# Patient Record
Sex: Female | Born: 1950 | Race: Black or African American | Hispanic: No | Marital: Single | Smoking: Never smoker
Health system: Southern US, Community
[De-identification: ages and names within clinical notes are randomized; demographics above are authoritative.]

## PROBLEM LIST (undated history)

## (undated) DIAGNOSIS — A048 Other specified bacterial intestinal infections: Secondary | ICD-10-CM

## (undated) DIAGNOSIS — M549 Dorsalgia, unspecified: Secondary | ICD-10-CM

## (undated) DIAGNOSIS — Z860100 Personal history of colon polyps, unspecified: Secondary | ICD-10-CM

## (undated) DIAGNOSIS — R7301 Impaired fasting glucose: Secondary | ICD-10-CM

## (undated) DIAGNOSIS — M179 Osteoarthritis of knee, unspecified: Secondary | ICD-10-CM

## (undated) DIAGNOSIS — I10 Essential (primary) hypertension: Secondary | ICD-10-CM

## (undated) DIAGNOSIS — Z9289 Personal history of other medical treatment: Secondary | ICD-10-CM

## (undated) DIAGNOSIS — Z8669 Personal history of other diseases of the nervous system and sense organs: Secondary | ICD-10-CM

## (undated) DIAGNOSIS — G479 Sleep disorder, unspecified: Secondary | ICD-10-CM

## (undated) DIAGNOSIS — M1710 Unilateral primary osteoarthritis, unspecified knee: Secondary | ICD-10-CM

## (undated) DIAGNOSIS — K219 Gastro-esophageal reflux disease without esophagitis: Secondary | ICD-10-CM

## (undated) DIAGNOSIS — E785 Hyperlipidemia, unspecified: Secondary | ICD-10-CM

## (undated) DIAGNOSIS — F419 Anxiety disorder, unspecified: Secondary | ICD-10-CM

## (undated) DIAGNOSIS — D259 Leiomyoma of uterus, unspecified: Secondary | ICD-10-CM

## (undated) DIAGNOSIS — E039 Hypothyroidism, unspecified: Secondary | ICD-10-CM

## (undated) DIAGNOSIS — G8929 Other chronic pain: Secondary | ICD-10-CM

## (undated) HISTORY — DX: Unilateral primary osteoarthritis, unspecified knee: M17.10

## (undated) HISTORY — DX: Sleep disorder, unspecified: G47.9

## (undated) HISTORY — DX: Hyperlipidemia, unspecified: E78.5

## (undated) HISTORY — DX: Personal history of colon polyps, unspecified: Z86.0100

## (undated) HISTORY — DX: Impaired fasting glucose: R73.01

## (undated) HISTORY — DX: Leiomyoma of uterus, unspecified: D25.9

## (undated) HISTORY — DX: Personal history of colonic polyps: Z86.010

## (undated) HISTORY — PX: ESOPHAGOGASTRODUODENOSCOPY: SHX1529

## (undated) HISTORY — PX: TUBAL LIGATION: SHX77

## (undated) HISTORY — PX: COLONOSCOPY: SHX174

## (undated) HISTORY — DX: Anxiety disorder, unspecified: F41.9

## (undated) HISTORY — DX: Essential (primary) hypertension: I10

## (undated) HISTORY — PX: OTHER SURGICAL HISTORY: SHX169

## (undated) HISTORY — PX: BACK SURGERY: SHX140

## (undated) HISTORY — DX: Osteoarthritis of knee, unspecified: M17.9

## (undated) HISTORY — DX: Other specified bacterial intestinal infections: A04.8

---

## 1996-09-10 HISTORY — PX: CARPAL TUNNEL RELEASE: SHX101

## 1997-12-16 ENCOUNTER — Encounter: Admission: RE | Admit: 1997-12-16 | Discharge: 1997-12-16 | Payer: Self-pay | Admitting: Family Medicine

## 1997-12-22 ENCOUNTER — Encounter: Admission: RE | Admit: 1997-12-22 | Discharge: 1997-12-22 | Payer: Self-pay | Admitting: Family Medicine

## 1998-04-20 ENCOUNTER — Encounter: Admission: RE | Admit: 1998-04-20 | Discharge: 1998-04-20 | Payer: Self-pay | Admitting: Family Medicine

## 1998-05-11 ENCOUNTER — Encounter: Admission: RE | Admit: 1998-05-11 | Discharge: 1998-05-11 | Payer: Self-pay | Admitting: Family Medicine

## 1998-06-01 ENCOUNTER — Encounter: Admission: RE | Admit: 1998-06-01 | Discharge: 1998-06-01 | Payer: Self-pay | Admitting: Family Medicine

## 1998-08-02 ENCOUNTER — Encounter: Admission: RE | Admit: 1998-08-02 | Discharge: 1998-08-02 | Payer: Self-pay | Admitting: Family Medicine

## 1998-09-07 ENCOUNTER — Encounter: Admission: RE | Admit: 1998-09-07 | Discharge: 1998-09-07 | Payer: Self-pay | Admitting: Family Medicine

## 1998-10-19 ENCOUNTER — Encounter: Admission: RE | Admit: 1998-10-19 | Discharge: 1998-10-19 | Payer: Self-pay | Admitting: Family Medicine

## 1998-10-19 ENCOUNTER — Ambulatory Visit (HOSPITAL_COMMUNITY): Admission: RE | Admit: 1998-10-19 | Discharge: 1998-10-19 | Payer: Self-pay

## 1998-10-26 ENCOUNTER — Encounter: Admission: RE | Admit: 1998-10-26 | Discharge: 1998-10-26 | Payer: Self-pay | Admitting: Family Medicine

## 1998-11-02 ENCOUNTER — Encounter: Admission: RE | Admit: 1998-11-02 | Discharge: 1998-11-02 | Payer: Self-pay | Admitting: Family Medicine

## 1998-11-18 ENCOUNTER — Encounter: Admission: RE | Admit: 1998-11-18 | Discharge: 1998-11-18 | Payer: Self-pay | Admitting: Family Medicine

## 1998-11-23 ENCOUNTER — Encounter: Admission: RE | Admit: 1998-11-23 | Discharge: 1998-11-23 | Payer: Self-pay | Admitting: Family Medicine

## 1998-11-25 ENCOUNTER — Encounter: Admission: RE | Admit: 1998-11-25 | Discharge: 1998-11-25 | Payer: Self-pay | Admitting: Family Medicine

## 1998-12-02 ENCOUNTER — Encounter: Admission: RE | Admit: 1998-12-02 | Discharge: 1998-12-02 | Payer: Self-pay | Admitting: Family Medicine

## 1998-12-05 ENCOUNTER — Encounter: Admission: RE | Admit: 1998-12-05 | Discharge: 1998-12-05 | Payer: Self-pay | Admitting: Family Medicine

## 1998-12-06 ENCOUNTER — Encounter: Admission: RE | Admit: 1998-12-06 | Discharge: 1998-12-06 | Payer: Self-pay | Admitting: Family Medicine

## 1998-12-14 ENCOUNTER — Encounter: Admission: RE | Admit: 1998-12-14 | Discharge: 1998-12-14 | Payer: Self-pay | Admitting: Family Medicine

## 1998-12-23 ENCOUNTER — Encounter: Admission: RE | Admit: 1998-12-23 | Discharge: 1998-12-23 | Payer: Self-pay | Admitting: Sports Medicine

## 1998-12-28 ENCOUNTER — Encounter: Admission: RE | Admit: 1998-12-28 | Discharge: 1998-12-28 | Payer: Self-pay | Admitting: Family Medicine

## 1999-01-02 ENCOUNTER — Ambulatory Visit (HOSPITAL_BASED_OUTPATIENT_CLINIC_OR_DEPARTMENT_OTHER): Admission: RE | Admit: 1999-01-02 | Discharge: 1999-01-02 | Payer: Self-pay | Admitting: Orthopedic Surgery

## 1999-02-09 HISTORY — PX: KNEE ARTHROSCOPY: SHX127

## 1999-03-16 ENCOUNTER — Encounter: Admission: RE | Admit: 1999-03-16 | Discharge: 1999-03-16 | Payer: Self-pay | Admitting: Family Medicine

## 1999-04-06 ENCOUNTER — Encounter: Admission: RE | Admit: 1999-04-06 | Discharge: 1999-04-06 | Payer: Self-pay | Admitting: Family Medicine

## 1999-07-12 ENCOUNTER — Encounter: Admission: RE | Admit: 1999-07-12 | Discharge: 1999-07-12 | Payer: Self-pay | Admitting: Family Medicine

## 1999-12-14 ENCOUNTER — Encounter: Admission: RE | Admit: 1999-12-14 | Discharge: 1999-12-14 | Payer: Self-pay | Admitting: Family Medicine

## 1999-12-14 ENCOUNTER — Ambulatory Visit (HOSPITAL_COMMUNITY): Admission: RE | Admit: 1999-12-14 | Discharge: 1999-12-14 | Payer: Self-pay | Admitting: *Deleted

## 2000-04-30 ENCOUNTER — Encounter: Admission: RE | Admit: 2000-04-30 | Discharge: 2000-04-30 | Payer: Self-pay | Admitting: Family Medicine

## 2000-06-04 ENCOUNTER — Encounter: Admission: RE | Admit: 2000-06-04 | Discharge: 2000-06-04 | Payer: Self-pay | Admitting: Sports Medicine

## 2000-06-04 ENCOUNTER — Encounter: Admission: RE | Admit: 2000-06-04 | Discharge: 2000-06-04 | Payer: Self-pay | Admitting: Family Medicine

## 2000-06-20 ENCOUNTER — Encounter: Admission: RE | Admit: 2000-06-20 | Discharge: 2000-06-20 | Payer: Self-pay | Admitting: Family Medicine

## 2000-07-02 ENCOUNTER — Encounter: Admission: RE | Admit: 2000-07-02 | Discharge: 2000-07-02 | Payer: Self-pay | Admitting: Family Medicine

## 2000-07-11 ENCOUNTER — Encounter: Admission: RE | Admit: 2000-07-11 | Discharge: 2000-07-11 | Payer: Self-pay | Admitting: Family Medicine

## 2000-10-22 ENCOUNTER — Encounter: Admission: RE | Admit: 2000-10-22 | Discharge: 2000-10-22 | Payer: Self-pay | Admitting: Family Medicine

## 2000-12-05 ENCOUNTER — Encounter: Payer: Self-pay | Admitting: Gastroenterology

## 2000-12-05 ENCOUNTER — Encounter: Admission: RE | Admit: 2000-12-05 | Discharge: 2000-12-05 | Payer: Self-pay | Admitting: Gastroenterology

## 2001-01-03 ENCOUNTER — Ambulatory Visit (HOSPITAL_COMMUNITY): Admission: RE | Admit: 2001-01-03 | Discharge: 2001-01-03 | Payer: Self-pay

## 2001-01-03 ENCOUNTER — Encounter: Payer: Self-pay | Admitting: Gastroenterology

## 2001-01-14 ENCOUNTER — Encounter: Admission: RE | Admit: 2001-01-14 | Discharge: 2001-01-14 | Payer: Self-pay | Admitting: Family Medicine

## 2001-02-05 ENCOUNTER — Ambulatory Visit (HOSPITAL_COMMUNITY): Admission: RE | Admit: 2001-02-05 | Discharge: 2001-02-05 | Payer: Self-pay | Admitting: Gastroenterology

## 2001-02-05 ENCOUNTER — Encounter (INDEPENDENT_AMBULATORY_CARE_PROVIDER_SITE_OTHER): Payer: Self-pay | Admitting: *Deleted

## 2001-03-12 ENCOUNTER — Encounter: Admission: RE | Admit: 2001-03-12 | Discharge: 2001-03-12 | Payer: Self-pay | Admitting: Family Medicine

## 2001-04-04 ENCOUNTER — Encounter: Admission: RE | Admit: 2001-04-04 | Discharge: 2001-04-04 | Payer: Self-pay | Admitting: Family Medicine

## 2001-04-30 ENCOUNTER — Encounter: Admission: RE | Admit: 2001-04-30 | Discharge: 2001-04-30 | Payer: Self-pay | Admitting: Family Medicine

## 2001-04-30 ENCOUNTER — Ambulatory Visit (HOSPITAL_COMMUNITY): Admission: RE | Admit: 2001-04-30 | Discharge: 2001-04-30 | Payer: Self-pay

## 2001-05-01 ENCOUNTER — Encounter: Payer: Self-pay | Admitting: *Deleted

## 2001-05-01 ENCOUNTER — Encounter: Admission: RE | Admit: 2001-05-01 | Discharge: 2001-05-01 | Payer: Self-pay | Admitting: Family Medicine

## 2001-05-01 ENCOUNTER — Encounter: Admission: RE | Admit: 2001-05-01 | Discharge: 2001-05-01 | Payer: Self-pay | Admitting: *Deleted

## 2001-08-11 ENCOUNTER — Encounter: Admission: RE | Admit: 2001-08-11 | Discharge: 2001-08-11 | Payer: Self-pay | Admitting: Family Medicine

## 2001-09-10 HISTORY — PX: ENDOMETRIAL BIOPSY: SHX622

## 2001-09-17 ENCOUNTER — Encounter: Admission: RE | Admit: 2001-09-17 | Discharge: 2001-09-17 | Payer: Self-pay | Admitting: Family Medicine

## 2001-10-14 ENCOUNTER — Encounter: Admission: RE | Admit: 2001-10-14 | Discharge: 2001-10-14 | Payer: Self-pay | Admitting: Family Medicine

## 2002-01-05 ENCOUNTER — Ambulatory Visit (HOSPITAL_COMMUNITY): Admission: RE | Admit: 2002-01-05 | Discharge: 2002-01-05 | Payer: Self-pay | Admitting: Family Medicine

## 2002-01-20 ENCOUNTER — Encounter (INDEPENDENT_AMBULATORY_CARE_PROVIDER_SITE_OTHER): Payer: Self-pay | Admitting: *Deleted

## 2002-01-20 ENCOUNTER — Encounter: Admission: RE | Admit: 2002-01-20 | Discharge: 2002-01-20 | Payer: Self-pay | Admitting: Family Medicine

## 2003-01-08 ENCOUNTER — Encounter: Admission: RE | Admit: 2003-01-08 | Discharge: 2003-01-08 | Payer: Self-pay | Admitting: Family Medicine

## 2003-01-26 ENCOUNTER — Encounter: Admission: RE | Admit: 2003-01-26 | Discharge: 2003-01-26 | Payer: Self-pay | Admitting: Family Medicine

## 2003-01-26 ENCOUNTER — Ambulatory Visit (HOSPITAL_COMMUNITY): Admission: RE | Admit: 2003-01-26 | Discharge: 2003-01-26 | Payer: Self-pay | Admitting: Family Medicine

## 2003-01-29 ENCOUNTER — Encounter: Payer: Self-pay | Admitting: Sports Medicine

## 2003-01-29 ENCOUNTER — Encounter: Admission: RE | Admit: 2003-01-29 | Discharge: 2003-01-29 | Payer: Self-pay | Admitting: Sports Medicine

## 2003-07-07 ENCOUNTER — Encounter: Admission: RE | Admit: 2003-07-07 | Discharge: 2003-07-07 | Payer: Self-pay | Admitting: Family Medicine

## 2004-01-03 ENCOUNTER — Ambulatory Visit (HOSPITAL_COMMUNITY): Admission: RE | Admit: 2004-01-03 | Discharge: 2004-01-03 | Payer: Self-pay | Admitting: Gastroenterology

## 2004-01-03 ENCOUNTER — Encounter (INDEPENDENT_AMBULATORY_CARE_PROVIDER_SITE_OTHER): Payer: Self-pay | Admitting: *Deleted

## 2004-01-27 ENCOUNTER — Ambulatory Visit (HOSPITAL_COMMUNITY): Admission: RE | Admit: 2004-01-27 | Discharge: 2004-01-27 | Payer: Self-pay | Admitting: Sports Medicine

## 2004-01-27 ENCOUNTER — Encounter: Admission: RE | Admit: 2004-01-27 | Discharge: 2004-01-27 | Payer: Self-pay | Admitting: Sports Medicine

## 2004-02-14 ENCOUNTER — Encounter: Admission: RE | Admit: 2004-02-14 | Discharge: 2004-02-14 | Payer: Self-pay | Admitting: Family Medicine

## 2004-06-30 ENCOUNTER — Ambulatory Visit: Payer: Self-pay | Admitting: Sports Medicine

## 2004-07-10 ENCOUNTER — Ambulatory Visit: Payer: Self-pay | Admitting: Sports Medicine

## 2004-07-12 ENCOUNTER — Encounter: Admission: RE | Admit: 2004-07-12 | Discharge: 2004-07-12 | Payer: Self-pay | Admitting: Sports Medicine

## 2004-08-29 ENCOUNTER — Ambulatory Visit: Payer: Self-pay | Admitting: Family Medicine

## 2004-09-15 ENCOUNTER — Encounter (INDEPENDENT_AMBULATORY_CARE_PROVIDER_SITE_OTHER): Payer: Self-pay | Admitting: *Deleted

## 2004-09-15 LAB — CONVERTED CEMR LAB

## 2004-09-18 ENCOUNTER — Ambulatory Visit: Payer: Self-pay | Admitting: Family Medicine

## 2004-10-19 ENCOUNTER — Ambulatory Visit: Payer: Self-pay | Admitting: Family Medicine

## 2004-12-07 ENCOUNTER — Ambulatory Visit: Payer: Self-pay | Admitting: Internal Medicine

## 2005-01-05 ENCOUNTER — Ambulatory Visit: Payer: Self-pay | Admitting: Internal Medicine

## 2005-02-06 ENCOUNTER — Ambulatory Visit (HOSPITAL_COMMUNITY): Admission: RE | Admit: 2005-02-06 | Discharge: 2005-02-06 | Payer: Self-pay | Admitting: Internal Medicine

## 2005-03-28 ENCOUNTER — Ambulatory Visit: Payer: Self-pay | Admitting: Internal Medicine

## 2005-05-28 ENCOUNTER — Ambulatory Visit: Payer: Self-pay | Admitting: Internal Medicine

## 2005-07-30 ENCOUNTER — Ambulatory Visit: Payer: Self-pay | Admitting: Internal Medicine

## 2005-10-29 ENCOUNTER — Ambulatory Visit: Payer: Self-pay | Admitting: Internal Medicine

## 2005-11-29 ENCOUNTER — Encounter: Payer: Self-pay | Admitting: Internal Medicine

## 2006-02-25 ENCOUNTER — Ambulatory Visit (HOSPITAL_COMMUNITY): Admission: RE | Admit: 2006-02-25 | Discharge: 2006-02-25 | Payer: Self-pay | Admitting: Internal Medicine

## 2006-02-28 ENCOUNTER — Ambulatory Visit: Payer: Self-pay | Admitting: Internal Medicine

## 2006-05-03 ENCOUNTER — Emergency Department (HOSPITAL_COMMUNITY): Admission: EM | Admit: 2006-05-03 | Discharge: 2006-05-03 | Payer: Self-pay | Admitting: Emergency Medicine

## 2006-05-13 ENCOUNTER — Emergency Department (HOSPITAL_COMMUNITY): Admission: EM | Admit: 2006-05-13 | Discharge: 2006-05-13 | Payer: Self-pay | Admitting: Family Medicine

## 2006-06-10 LAB — CONVERTED CEMR LAB: Pap Smear: NORMAL

## 2006-06-24 ENCOUNTER — Ambulatory Visit: Payer: Self-pay | Admitting: Internal Medicine

## 2006-06-24 LAB — CONVERTED CEMR LAB
AST: 22 units/L (ref 0–37)
Albumin: 3.6 g/dL (ref 3.5–5.2)
Alkaline Phosphatase: 126 units/L — ABNORMAL HIGH (ref 39–117)
BUN: 23 mg/dL (ref 6–23)
Calcium: 9.6 mg/dL (ref 8.4–10.5)
Cholesterol: 186 mg/dL (ref 0–200)
Creatinine, Ser: 1.2 mg/dL (ref 0.4–1.2)
Eosinophil percent: 3.5 % (ref 0.0–5.0)
HCT: 36.4 % (ref 36.0–46.0)
Hemoglobin: 12 g/dL (ref 12.0–15.0)
Hgb A1c MFr Bld: 5.7 % (ref 4.6–6.0)
Neutrophils Relative %: 55.5 % (ref 43.0–77.0)
Potassium: 4.4 meq/L (ref 3.5–5.1)
RDW: 13.4 % (ref 11.5–14.6)
Total Bilirubin: 0.5 mg/dL (ref 0.3–1.2)
Total Protein: 6.9 g/dL (ref 6.0–8.3)
Triglyceride fasting, serum: 70 mg/dL (ref 0–149)

## 2006-07-01 ENCOUNTER — Encounter: Payer: Self-pay | Admitting: Internal Medicine

## 2006-07-01 ENCOUNTER — Ambulatory Visit: Payer: Self-pay | Admitting: Internal Medicine

## 2006-07-01 ENCOUNTER — Encounter: Admission: RE | Admit: 2006-07-01 | Discharge: 2006-07-01 | Payer: Self-pay | Admitting: Internal Medicine

## 2006-07-01 ENCOUNTER — Other Ambulatory Visit: Admission: RE | Admit: 2006-07-01 | Discharge: 2006-07-01 | Payer: Self-pay | Admitting: Internal Medicine

## 2006-07-21 ENCOUNTER — Encounter: Admission: RE | Admit: 2006-07-21 | Discharge: 2006-07-21 | Payer: Self-pay | Admitting: Orthopedic Surgery

## 2006-07-26 ENCOUNTER — Ambulatory Visit: Payer: Self-pay | Admitting: Internal Medicine

## 2006-09-23 ENCOUNTER — Ambulatory Visit: Payer: Self-pay | Admitting: Internal Medicine

## 2006-09-23 LAB — CONVERTED CEMR LAB
ALT: 22 units/L (ref 0–40)
Bilirubin, Direct: 0.1 mg/dL (ref 0.0–0.3)
CO2: 28 meq/L (ref 19–32)
Chol/HDL Ratio, serum: 2
Cholesterol: 121 mg/dL (ref 0–200)
Glomerular Filtration Rate, Af Am: 43 mL/min/{1.73_m2}
Glucose, Bld: 126 mg/dL — ABNORMAL HIGH (ref 70–99)
HDL: 61.6 mg/dL (ref >39.0)
Potassium: 3.8 meq/L (ref 3.5–5.1)
Total Protein: 6.9 g/dL (ref 6.0–8.3)
Triglyceride fasting, serum: 56 mg/dL (ref 0–149)

## 2006-10-07 ENCOUNTER — Ambulatory Visit: Payer: Self-pay | Admitting: Internal Medicine

## 2006-10-11 ENCOUNTER — Encounter: Admission: RE | Admit: 2006-10-11 | Discharge: 2007-01-09 | Payer: Self-pay | Admitting: Internal Medicine

## 2006-10-25 ENCOUNTER — Encounter: Payer: Self-pay | Admitting: Internal Medicine

## 2006-11-02 ENCOUNTER — Ambulatory Visit (HOSPITAL_COMMUNITY): Admission: RE | Admit: 2006-11-02 | Discharge: 2006-11-02 | Payer: Self-pay | Admitting: Neurology

## 2006-11-08 ENCOUNTER — Encounter (INDEPENDENT_AMBULATORY_CARE_PROVIDER_SITE_OTHER): Payer: Self-pay | Admitting: *Deleted

## 2006-12-09 ENCOUNTER — Ambulatory Visit: Payer: Self-pay | Admitting: Internal Medicine

## 2006-12-09 LAB — CONVERTED CEMR LAB
Alkaline Phosphatase: 133 units/L — ABNORMAL HIGH (ref 39–117)
BUN: 22 mg/dL (ref 6–23)
CO2: 31 meq/L (ref 19–32)
GFR calc Af Amer: 60 mL/min
Potassium: 4.3 meq/L (ref 3.5–5.1)
Total Protein: 6.5 g/dL (ref 6.0–8.3)

## 2007-02-12 ENCOUNTER — Other Ambulatory Visit (HOSPITAL_COMMUNITY): Admission: RE | Admit: 2007-02-12 | Discharge: 2007-05-13 | Payer: Self-pay | Admitting: Psychiatry

## 2007-02-12 ENCOUNTER — Ambulatory Visit: Payer: Self-pay | Admitting: Psychiatry

## 2007-04-17 ENCOUNTER — Ambulatory Visit (HOSPITAL_COMMUNITY): Admission: RE | Admit: 2007-04-17 | Discharge: 2007-04-17 | Payer: Self-pay | Admitting: Internal Medicine

## 2007-05-13 DIAGNOSIS — Z860100 Personal history of colon polyps, unspecified: Secondary | ICD-10-CM | POA: Insufficient documentation

## 2007-05-13 DIAGNOSIS — F329 Major depressive disorder, single episode, unspecified: Secondary | ICD-10-CM

## 2007-05-13 DIAGNOSIS — I10 Essential (primary) hypertension: Secondary | ICD-10-CM | POA: Insufficient documentation

## 2007-05-13 DIAGNOSIS — R7309 Other abnormal glucose: Secondary | ICD-10-CM | POA: Insufficient documentation

## 2007-05-13 DIAGNOSIS — E785 Hyperlipidemia, unspecified: Secondary | ICD-10-CM | POA: Insufficient documentation

## 2007-08-18 ENCOUNTER — Other Ambulatory Visit (HOSPITAL_COMMUNITY): Admission: RE | Admit: 2007-08-18 | Discharge: 2007-09-25 | Payer: Self-pay | Admitting: Psychiatry

## 2007-08-19 ENCOUNTER — Ambulatory Visit: Payer: Self-pay | Admitting: Psychiatry

## 2007-08-26 ENCOUNTER — Telehealth: Payer: Self-pay | Admitting: Internal Medicine

## 2007-10-01 ENCOUNTER — Ambulatory Visit: Payer: Self-pay | Admitting: Internal Medicine

## 2007-10-01 LAB — CONVERTED CEMR LAB
Bilirubin Urine: NEGATIVE
Glucose, Urine, Semiquant: NEGATIVE
Protein, U semiquant: NEGATIVE
Specific Gravity, Urine: 1.025
Urobilinogen, UA: 0.2
pH: 7

## 2007-10-05 LAB — CONVERTED CEMR LAB
Albumin: 3.6 g/dL (ref 3.5–5.2)
Alkaline Phosphatase: 163 units/L — ABNORMAL HIGH (ref 39–117)
BUN: 18 mg/dL (ref 6–23)
Basophils Absolute: 0 10*3/uL (ref 0.0–0.1)
Cholesterol: 158 mg/dL (ref 0–200)
GFR calc Af Amer: 50 mL/min
HDL: 64 mg/dL (ref >39.0)
Hemoglobin: 12.4 g/dL (ref 12.0–15.0)
Lymphocytes Relative: 21.8 % (ref 12.0–46.0)
MCHC: 34.2 g/dL (ref 30.0–36.0)
MCV: 93.7 fL (ref 78.0–100.0)
Monocytes Absolute: 0.4 10*3/uL (ref 0.2–0.7)
Monocytes Relative: 5.8 % (ref 3.0–11.0)
Neutro Abs: 4.8 10*3/uL (ref 1.4–7.7)
Neutrophils Relative %: 65.3 % (ref 43.0–77.0)
Potassium: 4.3 meq/L (ref 3.5–5.1)
Sodium: 141 meq/L (ref 135–145)
TSH: 3.15 microintl units/mL (ref 0.35–5.50)
Total Protein: 6.8 g/dL (ref 6.0–8.3)

## 2007-10-07 ENCOUNTER — Encounter: Payer: Self-pay | Admitting: Internal Medicine

## 2007-10-07 ENCOUNTER — Ambulatory Visit: Payer: Self-pay | Admitting: Internal Medicine

## 2007-10-07 ENCOUNTER — Other Ambulatory Visit: Admission: RE | Admit: 2007-10-07 | Discharge: 2007-10-07 | Payer: Self-pay | Admitting: Internal Medicine

## 2007-10-07 DIAGNOSIS — K219 Gastro-esophageal reflux disease without esophagitis: Secondary | ICD-10-CM

## 2007-10-07 DIAGNOSIS — R748 Abnormal levels of other serum enzymes: Secondary | ICD-10-CM | POA: Insufficient documentation

## 2007-12-03 ENCOUNTER — Ambulatory Visit: Payer: Self-pay | Admitting: Internal Medicine

## 2007-12-07 LAB — CONVERTED CEMR LAB
CO2: 30 meq/L (ref 19–32)
Calcium: 9.3 mg/dL (ref 8.4–10.5)
Chloride: 104 meq/L (ref 96–112)
Creatinine, Ser: 1.1 mg/dL (ref 0.4–1.2)
Creatinine,U: 145.8 mg/dL
GFR calc non Af Amer: 55 mL/min
Microalb, Ur: 0.2 mg/dL (ref 0.0–1.9)
Sodium: 141 meq/L (ref 135–145)

## 2007-12-09 ENCOUNTER — Ambulatory Visit: Payer: Self-pay | Admitting: Internal Medicine

## 2007-12-23 ENCOUNTER — Telehealth: Payer: Self-pay | Admitting: Internal Medicine

## 2007-12-31 ENCOUNTER — Telehealth: Payer: Self-pay | Admitting: Internal Medicine

## 2008-01-09 ENCOUNTER — Encounter: Payer: Self-pay | Admitting: Internal Medicine

## 2008-01-09 ENCOUNTER — Emergency Department (HOSPITAL_COMMUNITY): Admission: EM | Admit: 2008-01-09 | Discharge: 2008-01-09 | Payer: Self-pay | Admitting: Emergency Medicine

## 2008-01-13 ENCOUNTER — Ambulatory Visit: Payer: Self-pay | Admitting: Internal Medicine

## 2008-01-16 ENCOUNTER — Telehealth: Payer: Self-pay | Admitting: Internal Medicine

## 2008-01-19 LAB — CONVERTED CEMR LAB
CO2: 29 meq/L (ref 19–32)
Calcium: 9.7 mg/dL (ref 8.4–10.5)
Creatinine, Ser: 1.1 mg/dL (ref 0.4–1.2)
GFR calc Af Amer: 66 mL/min
Glucose, Bld: 84 mg/dL (ref 70–99)
Sodium: 142 meq/L (ref 135–145)

## 2008-02-09 ENCOUNTER — Ambulatory Visit: Payer: Self-pay | Admitting: Internal Medicine

## 2008-02-16 LAB — CONVERTED CEMR LAB
BUN: 19 mg/dL (ref 6–23)
Calcium: 9.6 mg/dL (ref 8.4–10.5)
Creatinine, Ser: 1.3 mg/dL — ABNORMAL HIGH (ref 0.4–1.2)
GFR calc Af Amer: 54 mL/min
GFR calc non Af Amer: 45 mL/min
Glucose, Bld: 99 mg/dL (ref 70–99)
Potassium: 3.3 meq/L — ABNORMAL LOW (ref 3.5–5.1)

## 2008-03-15 ENCOUNTER — Telehealth: Payer: Self-pay | Admitting: *Deleted

## 2008-03-16 ENCOUNTER — Telehealth: Payer: Self-pay | Admitting: Internal Medicine

## 2008-03-24 ENCOUNTER — Telehealth: Payer: Self-pay | Admitting: Internal Medicine

## 2008-04-05 ENCOUNTER — Ambulatory Visit: Payer: Self-pay | Admitting: Internal Medicine

## 2008-04-06 LAB — CONVERTED CEMR LAB
ALT: 21 units/L (ref 0–35)
Alkaline Phosphatase: 112 units/L (ref 39–117)
Basophils Absolute: 0 10*3/uL (ref 0.0–0.1)
Bilirubin, Direct: 0.1 mg/dL (ref 0.0–0.3)
CO2: 29 meq/L (ref 19–32)
Calcium: 9.6 mg/dL (ref 8.4–10.5)
Glucose, Bld: 108 mg/dL — ABNORMAL HIGH (ref 70–99)
Lymphocytes Relative: 29 % (ref 12.0–46.0)
MCHC: 34.7 g/dL (ref 30.0–36.0)
Monocytes Relative: 9.5 % (ref 3.0–12.0)
Neutro Abs: 2.8 10*3/uL (ref 1.4–7.7)
Neutrophils Relative %: 58.6 % (ref 43.0–77.0)
Platelets: 279 10*3/uL (ref 150–400)
Potassium: 4.7 meq/L (ref 3.5–5.1)
RDW: 13.1 % (ref 11.5–14.6)
Sodium: 141 meq/L (ref 135–145)
Total Bilirubin: 0.5 mg/dL (ref 0.3–1.2)
Total Protein: 6.8 g/dL (ref 6.0–8.3)

## 2008-05-06 ENCOUNTER — Telehealth: Payer: Self-pay | Admitting: Internal Medicine

## 2008-05-19 ENCOUNTER — Telehealth: Payer: Self-pay | Admitting: *Deleted

## 2008-05-21 ENCOUNTER — Ambulatory Visit: Payer: Self-pay | Admitting: Internal Medicine

## 2008-05-21 DIAGNOSIS — R413 Other amnesia: Secondary | ICD-10-CM | POA: Insufficient documentation

## 2008-06-18 ENCOUNTER — Telehealth: Payer: Self-pay | Admitting: *Deleted

## 2008-06-23 ENCOUNTER — Ambulatory Visit: Payer: Self-pay | Admitting: Internal Medicine

## 2008-06-25 ENCOUNTER — Encounter: Admission: RE | Admit: 2008-06-25 | Discharge: 2008-06-25 | Payer: Self-pay | Admitting: Neurology

## 2008-08-03 ENCOUNTER — Telehealth: Payer: Self-pay | Admitting: Internal Medicine

## 2008-08-06 ENCOUNTER — Telehealth: Payer: Self-pay | Admitting: Internal Medicine

## 2008-08-09 ENCOUNTER — Encounter: Admission: RE | Admit: 2008-08-09 | Discharge: 2008-08-09 | Payer: Self-pay | Admitting: Neurology

## 2008-08-09 ENCOUNTER — Ambulatory Visit: Payer: Self-pay | Admitting: Psychology

## 2008-09-07 ENCOUNTER — Ambulatory Visit: Payer: Self-pay | Admitting: Internal Medicine

## 2008-09-07 DIAGNOSIS — M1990 Unspecified osteoarthritis, unspecified site: Secondary | ICD-10-CM | POA: Insufficient documentation

## 2008-09-13 LAB — CONVERTED CEMR LAB
BUN: 11 mg/dL (ref 6–23)
CO2: 29 meq/L (ref 19–32)
Calcium: 9.6 mg/dL (ref 8.4–10.5)
Glucose, Bld: 92 mg/dL (ref 70–99)
Potassium: 3.9 meq/L (ref 3.5–5.1)
Sodium: 141 meq/L (ref 135–145)

## 2008-11-10 ENCOUNTER — Telehealth: Payer: Self-pay | Admitting: *Deleted

## 2008-11-12 ENCOUNTER — Telehealth: Payer: Self-pay | Admitting: *Deleted

## 2008-12-16 ENCOUNTER — Telehealth: Payer: Self-pay | Admitting: *Deleted

## 2008-12-30 ENCOUNTER — Telehealth: Payer: Self-pay | Admitting: *Deleted

## 2008-12-31 ENCOUNTER — Encounter: Payer: Self-pay | Admitting: *Deleted

## 2009-01-14 ENCOUNTER — Encounter: Payer: Self-pay | Admitting: *Deleted

## 2009-01-14 ENCOUNTER — Telehealth: Payer: Self-pay | Admitting: *Deleted

## 2009-01-18 ENCOUNTER — Telehealth: Payer: Self-pay | Admitting: *Deleted

## 2009-04-01 ENCOUNTER — Telehealth: Payer: Self-pay | Admitting: *Deleted

## 2009-04-22 ENCOUNTER — Telehealth: Payer: Self-pay | Admitting: *Deleted

## 2009-05-27 ENCOUNTER — Ambulatory Visit: Payer: Self-pay | Admitting: Internal Medicine

## 2009-05-31 LAB — CONVERTED CEMR LAB
AST: 23 units/L (ref 0–37)
Albumin: 3.8 g/dL (ref 3.5–5.2)
Alkaline Phosphatase: 154 units/L — ABNORMAL HIGH (ref 39–117)
BUN: 15 mg/dL (ref 6–23)
Basophils Relative: 0.6 % (ref 0.0–3.0)
CO2: 29 meq/L (ref 19–32)
Cholesterol: 160 mg/dL (ref 0–200)
Eosinophils Relative: 5.9 % — ABNORMAL HIGH (ref 0.0–5.0)
Glucose, Bld: 100 mg/dL — ABNORMAL HIGH (ref 70–99)
HCT: 35.3 % — ABNORMAL LOW (ref 36.0–46.0)
Hemoglobin: 11.7 g/dL — ABNORMAL LOW (ref 12.0–15.0)
Hgb A1c MFr Bld: 5.7 % (ref 4.6–6.5)
Lymphs Abs: 1.5 10*3/uL (ref 0.7–4.0)
MCV: 97.5 fL (ref 78.0–100.0)
Monocytes Absolute: 0.4 10*3/uL (ref 0.1–1.0)
Neutro Abs: 2.1 10*3/uL (ref 1.4–7.7)
Potassium: 4.5 meq/L (ref 3.5–5.1)
RBC: 3.62 M/uL — ABNORMAL LOW (ref 3.87–5.11)
Sodium: 142 meq/L (ref 135–145)
Total Protein: 6.9 g/dL (ref 6.0–8.3)
WBC: 4.2 10*3/uL — ABNORMAL LOW (ref 4.5–10.5)

## 2009-07-11 ENCOUNTER — Telehealth: Payer: Self-pay | Admitting: *Deleted

## 2009-07-19 ENCOUNTER — Ambulatory Visit: Payer: Self-pay | Admitting: Internal Medicine

## 2009-07-19 LAB — CONVERTED CEMR LAB
ALT: 19 units/L (ref 0–35)
AST: 18 units/L (ref 0–37)
Albumin: 3.8 g/dL (ref 3.5–5.2)
Alkaline Phosphatase: 121 units/L — ABNORMAL HIGH (ref 39–117)
Basophils Absolute: 0 10*3/uL (ref 0.0–0.1)
HCT: 35 % — ABNORMAL LOW (ref 36.0–46.0)
Iron: 57 ug/dL (ref 42–145)
Lymphs Abs: 1.3 10*3/uL (ref 0.7–4.0)
Monocytes Absolute: 0.4 10*3/uL (ref 0.1–1.0)
Monocytes Relative: 8.9 % (ref 3.0–12.0)
Platelets: 226 10*3/uL (ref 150.0–400.0)
RDW: 12.9 % (ref 11.5–14.6)
Vitamin B-12: 414 pg/mL (ref 211–911)

## 2009-07-21 DIAGNOSIS — E559 Vitamin D deficiency, unspecified: Secondary | ICD-10-CM | POA: Insufficient documentation

## 2009-09-07 ENCOUNTER — Telehealth: Payer: Self-pay | Admitting: *Deleted

## 2009-09-10 HISTORY — PX: KNEE SURGERY: SHX244

## 2009-09-10 HISTORY — PX: TOTAL KNEE ARTHROPLASTY: SHX125

## 2009-10-04 ENCOUNTER — Ambulatory Visit: Payer: Self-pay | Admitting: Internal Medicine

## 2009-10-04 LAB — CONVERTED CEMR LAB
AST: 23 units/L (ref 0–37)
Alkaline Phosphatase: 110 units/L (ref 39–117)
BUN: 18 mg/dL (ref 6–23)
Basophils Absolute: 0 10*3/uL (ref 0.0–0.1)
Bilirubin Urine: NEGATIVE
Calcium: 9.2 mg/dL (ref 8.4–10.5)
Cholesterol: 126 mg/dL (ref 0–200)
Eosinophils Absolute: 0.2 10*3/uL (ref 0.0–0.7)
GFR calc non Af Amer: 59.22 mL/min (ref >60)
Glucose, Bld: 101 mg/dL — ABNORMAL HIGH (ref 70–99)
HDL: 78.3 mg/dL (ref >39.00)
Ketones, urine, test strip: NEGATIVE
Lymphocytes Relative: 40.2 % (ref 12.0–46.0)
Lymphs Abs: 1.4 10*3/uL (ref 0.7–4.0)
Monocytes Relative: 11.3 % (ref 3.0–12.0)
Platelets: 227 10*3/uL (ref 150.0–400.0)
Protein, U semiquant: NEGATIVE
RDW: 13.4 % (ref 11.5–14.6)
TSH: 3.56 microintl units/mL (ref 0.35–5.50)
Total Bilirubin: 0.7 mg/dL (ref 0.3–1.2)
Triglycerides: 34 mg/dL (ref 0.0–149.0)
Urobilinogen, UA: 0.2
VLDL: 6.8 mg/dL (ref 0.0–40.0)
Vit D, 25-Hydroxy: 31 ng/mL (ref 30–89)

## 2009-10-11 ENCOUNTER — Ambulatory Visit: Payer: Self-pay | Admitting: Internal Medicine

## 2009-10-11 ENCOUNTER — Other Ambulatory Visit: Admission: RE | Admit: 2009-10-11 | Discharge: 2009-10-11 | Payer: Self-pay | Admitting: Internal Medicine

## 2009-11-07 ENCOUNTER — Ambulatory Visit (HOSPITAL_COMMUNITY): Admission: RE | Admit: 2009-11-07 | Discharge: 2009-11-07 | Payer: Self-pay | Admitting: Internal Medicine

## 2009-11-21 ENCOUNTER — Inpatient Hospital Stay (HOSPITAL_COMMUNITY): Admission: RE | Admit: 2009-11-21 | Discharge: 2009-11-30 | Payer: Self-pay | Admitting: Psychiatry

## 2009-11-21 ENCOUNTER — Emergency Department (HOSPITAL_COMMUNITY): Admission: EM | Admit: 2009-11-21 | Discharge: 2009-11-21 | Payer: Self-pay | Admitting: Emergency Medicine

## 2009-11-21 ENCOUNTER — Ambulatory Visit: Payer: Self-pay | Admitting: Psychiatry

## 2009-12-26 ENCOUNTER — Telehealth: Payer: Self-pay | Admitting: *Deleted

## 2009-12-27 ENCOUNTER — Telehealth: Payer: Self-pay | Admitting: *Deleted

## 2010-01-03 ENCOUNTER — Encounter: Payer: Self-pay | Admitting: Internal Medicine

## 2010-01-03 ENCOUNTER — Telehealth: Payer: Self-pay | Admitting: Internal Medicine

## 2010-01-09 ENCOUNTER — Ambulatory Visit: Payer: Self-pay | Admitting: Internal Medicine

## 2010-01-09 ENCOUNTER — Telehealth: Payer: Self-pay | Admitting: *Deleted

## 2010-01-11 ENCOUNTER — Encounter (INDEPENDENT_AMBULATORY_CARE_PROVIDER_SITE_OTHER): Payer: Self-pay | Admitting: *Deleted

## 2010-01-11 ENCOUNTER — Telehealth: Payer: Self-pay | Admitting: *Deleted

## 2010-01-16 ENCOUNTER — Encounter: Payer: Self-pay | Admitting: Internal Medicine

## 2010-01-20 ENCOUNTER — Inpatient Hospital Stay (HOSPITAL_COMMUNITY): Admission: RE | Admit: 2010-01-20 | Discharge: 2010-01-24 | Payer: Self-pay | Admitting: Orthopedic Surgery

## 2010-01-20 ENCOUNTER — Encounter: Payer: Self-pay | Admitting: Internal Medicine

## 2010-02-03 ENCOUNTER — Encounter (INDEPENDENT_AMBULATORY_CARE_PROVIDER_SITE_OTHER): Payer: Self-pay | Admitting: *Deleted

## 2010-02-07 ENCOUNTER — Telehealth: Payer: Self-pay | Admitting: Internal Medicine

## 2010-02-08 ENCOUNTER — Encounter: Payer: Self-pay | Admitting: Internal Medicine

## 2010-02-09 ENCOUNTER — Encounter (INDEPENDENT_AMBULATORY_CARE_PROVIDER_SITE_OTHER): Payer: Self-pay | Admitting: *Deleted

## 2010-02-17 ENCOUNTER — Ambulatory Visit: Payer: Self-pay | Admitting: Internal Medicine

## 2010-02-21 ENCOUNTER — Encounter: Payer: Self-pay | Admitting: Internal Medicine

## 2010-03-01 ENCOUNTER — Telehealth: Payer: Self-pay | Admitting: Internal Medicine

## 2010-03-03 ENCOUNTER — Telehealth: Payer: Self-pay | Admitting: Internal Medicine

## 2010-03-15 ENCOUNTER — Ambulatory Visit: Payer: Self-pay | Admitting: Internal Medicine

## 2010-03-17 ENCOUNTER — Telehealth: Payer: Self-pay | Admitting: Internal Medicine

## 2010-03-17 ENCOUNTER — Encounter: Payer: Self-pay | Admitting: Internal Medicine

## 2010-03-20 ENCOUNTER — Ambulatory Visit: Payer: Self-pay | Admitting: Internal Medicine

## 2010-03-20 DIAGNOSIS — Z96659 Presence of unspecified artificial knee joint: Secondary | ICD-10-CM

## 2010-03-22 ENCOUNTER — Telehealth: Payer: Self-pay | Admitting: *Deleted

## 2010-04-10 ENCOUNTER — Ambulatory Visit: Payer: Self-pay | Admitting: Internal Medicine

## 2010-04-10 DIAGNOSIS — R1319 Other dysphagia: Secondary | ICD-10-CM | POA: Insufficient documentation

## 2010-04-11 LAB — CONVERTED CEMR LAB
BUN: 18 mg/dL (ref 6–23)
Basophils Absolute: 0 10*3/uL (ref 0.0–0.1)
Basophils Relative: 0.3 % (ref 0.0–3.0)
CO2: 30 meq/L (ref 19–32)
Calcium: 9.4 mg/dL (ref 8.4–10.5)
Chloride: 106 meq/L (ref 96–112)
Creatinine, Ser: 1.5 mg/dL — ABNORMAL HIGH (ref 0.4–1.2)
Eosinophils Relative: 2.3 % (ref 0.0–5.0)
Glucose, Bld: 114 mg/dL — ABNORMAL HIGH (ref 70–99)
Lymphocytes Relative: 30.3 % (ref 12.0–46.0)
Lymphs Abs: 1.6 10*3/uL (ref 0.7–4.0)
Monocytes Relative: 12.5 % — ABNORMAL HIGH (ref 3.0–12.0)
Neutro Abs: 2.9 10*3/uL (ref 1.4–7.7)
Transferrin: 176.3 mg/dL — ABNORMAL LOW (ref 212.0–360.0)

## 2010-04-13 ENCOUNTER — Encounter: Payer: Self-pay | Admitting: Internal Medicine

## 2010-04-13 ENCOUNTER — Ambulatory Visit: Payer: Self-pay | Admitting: Internal Medicine

## 2010-04-13 DIAGNOSIS — K5730 Diverticulosis of large intestine without perforation or abscess without bleeding: Secondary | ICD-10-CM | POA: Insufficient documentation

## 2010-04-25 ENCOUNTER — Ambulatory Visit: Payer: Self-pay | Admitting: Internal Medicine

## 2010-05-23 ENCOUNTER — Telehealth: Payer: Self-pay | Admitting: *Deleted

## 2010-06-12 ENCOUNTER — Ambulatory Visit: Payer: Self-pay | Admitting: Internal Medicine

## 2010-06-12 LAB — CONVERTED CEMR LAB
Basophils Absolute: 0 10*3/uL (ref 0.0–0.1)
Chloride: 105 meq/L (ref 96–112)
Eosinophils Relative: 1.3 % (ref 0.0–5.0)
GFR calc non Af Amer: 47.12 mL/min (ref >60)
Glucose, Bld: 106 mg/dL — ABNORMAL HIGH (ref 70–99)
HCT: 34.9 % — ABNORMAL LOW (ref 36.0–46.0)
Hemoglobin: 11.7 g/dL — ABNORMAL LOW (ref 12.0–15.0)
Lymphs Abs: 1.5 10*3/uL (ref 0.7–4.0)
MCV: 97.2 fL (ref 78.0–100.0)
Monocytes Absolute: 0.4 10*3/uL (ref 0.1–1.0)
Monocytes Relative: 8.5 % (ref 3.0–12.0)
Neutro Abs: 2.8 10*3/uL (ref 1.4–7.7)
Potassium: 3.8 meq/L (ref 3.5–5.1)
RDW: 13.3 % (ref 11.5–14.6)
Sodium: 140 meq/L (ref 135–145)

## 2010-06-15 ENCOUNTER — Encounter: Payer: Self-pay | Admitting: Internal Medicine

## 2010-06-19 ENCOUNTER — Ambulatory Visit: Payer: Self-pay | Admitting: Internal Medicine

## 2010-06-23 ENCOUNTER — Inpatient Hospital Stay (HOSPITAL_COMMUNITY): Admission: RE | Admit: 2010-06-23 | Discharge: 2010-06-27 | Payer: Self-pay | Admitting: Orthopedic Surgery

## 2010-06-27 ENCOUNTER — Encounter: Payer: Self-pay | Admitting: Internal Medicine

## 2010-07-19 ENCOUNTER — Ambulatory Visit: Payer: Self-pay | Admitting: Internal Medicine

## 2010-07-19 DIAGNOSIS — D649 Anemia, unspecified: Secondary | ICD-10-CM | POA: Insufficient documentation

## 2010-07-31 ENCOUNTER — Encounter: Payer: Self-pay | Admitting: Internal Medicine

## 2010-08-22 ENCOUNTER — Ambulatory Visit: Payer: Self-pay | Admitting: Internal Medicine

## 2010-08-22 DIAGNOSIS — E669 Obesity, unspecified: Secondary | ICD-10-CM

## 2010-08-29 ENCOUNTER — Emergency Department (HOSPITAL_COMMUNITY)
Admission: EM | Admit: 2010-08-29 | Discharge: 2010-08-29 | Payer: Self-pay | Source: Home / Self Care | Admitting: Emergency Medicine

## 2010-09-07 ENCOUNTER — Ambulatory Visit
Admission: RE | Admit: 2010-09-07 | Discharge: 2010-09-07 | Payer: Self-pay | Source: Home / Self Care | Attending: Internal Medicine | Admitting: Internal Medicine

## 2010-09-07 ENCOUNTER — Telehealth: Payer: Self-pay | Admitting: Internal Medicine

## 2010-09-07 DIAGNOSIS — S298XXA Other specified injuries of thorax, initial encounter: Secondary | ICD-10-CM | POA: Insufficient documentation

## 2010-09-26 ENCOUNTER — Encounter: Payer: Self-pay | Admitting: Internal Medicine

## 2010-10-01 ENCOUNTER — Encounter: Payer: Self-pay | Admitting: Internal Medicine

## 2010-10-02 ENCOUNTER — Encounter: Payer: Self-pay | Admitting: Internal Medicine

## 2010-10-10 NOTE — Consult Note (Signed)
Summary: [facility]  [facility]   Imported By: Maryln Gottron 01/25/2010 12:36:07  _____________________________________________________________________  External Attachment:    Type:   Image     Comment:   External Document

## 2010-10-10 NOTE — Progress Notes (Signed)
Summary: surgical clearence  Phone Note Call from Patient Call back at Home Phone [phone]   Caller: Patient Summary of Call: Pt saw Dr. Althea Charon today and they are going to do surgery Left knee replacement. Pt is unsure of the date. There office is going to fax over info to [place] for surgerical clearence. Pt had a cpx with ekg on 10/11/2009 Initial call taken by: Romualdo Bolk, CMA (AAMA),  January 03, 2010 1:52 PM

## 2010-10-10 NOTE — Letter (Signed)
Summary: EGD Instructions  [facility]  [address] [place], [place] [address]   Phone: [phone]  Fax: [phone]       [name]    [birth year]    MRN: [id number]       Procedure Day /Date:04-25-10     Arrival Time: 12:30 PM      Procedure Time: 1:30 PM     Location of Procedure:                    X     [facility] (4th Floor)  PREPARATION FOR ENDOSCOPY   On 04-25-10 THE DAY OF THE PROCEDURE:  1.   No solid foods, milk or milk products are allowed after midnight the night before your procedure.  2.   Do not drink anything colored red or purple.  Avoid juices with pulp.  No orange juice.  3.  You may drink clear liquids until 11:30 AM  which is 2 hours before your procedure.                                                                                                CLEAR LIQUIDS INCLUDE: Water Jello Ice Popsicles Tea (sugar ok, no milk/cream) Powdered fruit flavored drinks Coffee (sugar ok, no milk/cream) Gatorade Juice: apple, white grape, white cranberry  Lemonade Clear bullion, consomm, broth Carbonated beverages (any kind) Strained chicken noodle soup Hard Candy   MEDICATION INSTRUCTIONS  Unless otherwise instructed, you should take regular prescription medications with a small sip of water as early as possible the morning of your procedure.        OTHER INSTRUCTIONS  You will need a responsible adult at least 60 years of age to accompany you and drive you home.   This person must remain in the waiting room during your procedure.  Wear loose fitting clothing that is easily removed.  Leave jewelry and other valuables at home.  However, you may wish to bring a book to read or an iPod/MP3 player to listen to music as you wait for your procedure to start.  Remove all body piercing jewelry and leave at home.  Total time from sign-in until discharge is approximately 2-3 hours.  You should go home directly after your  procedure and rest.  You can resume normal activities the day after your procedure.  The day of your procedure you should not:   Drive   Make legal decisions   Operate machinery   Drink alcohol   Return to work  You will receive specific instructions about eating, activities and medications before you leave.    The above instructions have been reviewed and explained to me by   _______________________    I fully understand and can verbalize these instructions _____________________________ Date _________

## 2010-10-10 NOTE — Letter (Signed)
Summary: Previsit letter  [facility]  [address] [place], [place] [address]   Phone: [phone]  Fax: [phone]       01/11/2010 MRN: [id number]  [facility] [address] [place], [place]  [address]  Dear [name],  Welcome to the Gastroenterology Division at [facility].    You are scheduled to see a nurse for your pre-procedure visit on 02-10-10 at 1PM on the 3rd floor at [facility], [address]  We ask that you try to arrive at our office 15 minutes prior to your appointment time to allow for check-in.  Your nurse visit will consist of discussing your medical and surgical history, your immediate family medical history, and your medications.    Please bring a complete list of all your medications or, if you prefer, bring the medication bottles and we will list them.  We will need to be aware of both prescribed and over the counter drugs.  We will need to know exact dosage information as well.  If you are on blood thinners (Coumadin, Plavix, Aggrenox, Ticlid, etc.) please call our office today/prior to your appointment, as we need to consult with your physician about holding your medication.   Please be prepared to read and sign documents such as consent forms, a financial agreement, and acknowledgement forms.  If necessary, and with your consent, a friend or relative is welcome to sit-in on the nurse visit with you.  Please bring your insurance card so that we may make a copy of it.  If your insurance requires a referral to see a specialist, please bring your referral form from your primary care physician.  No co-pay is required for this nurse visit.     If you cannot keep your appointment, please call [phone] to cancel or reschedule prior to your appointment date.  This allows [place] the opportunity to schedule an appointment for another patient in need of care.    Thank you for choosing [facility] for your medical  needs.  We appreciate the opportunity to care for you.  Please visit [place] at our website  to learn more about our practice.                     Sincerely.                                                                                                                   The Gastroenterology Division

## 2010-10-10 NOTE — Progress Notes (Signed)
Summary: bp advise  Phone Note From Other Clinic Call back at [phone]   Caller: [facility] Summary of Call: Folowing the patient's bp. This morning---bp is 145/95, pulse oz is 96% and pulse is 80. 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 Even with her pain meds, she still having a lot of pain, which causes her bp issue. Please advise.  Initial call taken by: Warnell Forester,  March 03, 2010 10:34 AM  Follow-up for Phone Call        Left message for Almyra Free to call back. I have also called Dr. Luiz Blare office and spoke with Angie and they will try to send her something stronger for pain. But if it gets too bad then she needs to come in. They have a Sat Clinic that she can go to. Follow-up by: Romualdo Bolk, CMA (AAMA),  March 03, 2010 11:07 AM     Appended Document: bp advise Almyra Free called back saying that pt is having pain in both knees and pain is on a scale of 6. Pt is not having any cardiac symptoms. Almyra Free has also reviewed with pt the signs and symptoms of a stroke and when to call 911. Pt was also given a list of info on this as well by [facility]. Almyra Free is aware that Angie from Dr. Luiz Blare office would be contacting the pt about increasing her pain meds.

## 2010-10-10 NOTE — Progress Notes (Signed)
Summary: refills  Phone Note From Pharmacy   [facility] [address] [phone]* Reason for Call: Needs renewal Details for Reason: micardis, iburofen,tramadol, nexium and crestor Initial call taken by: Romualdo Bolk, CMA (AAMA),  Jan 09, 2010 1:37 PM  Follow-up for Phone Call        please do this for 6 months worth. Follow-up by: Madelin Headings MD,  Jan 09, 2010 3:44 PM  Additional Follow-up for Phone Call Additional follow up Details #1::        rx sent  Additional Follow-up by: Romualdo Bolk, CMA Duncan Dull),  Jan 09, 2010 4:39 PM    Prescriptions: TRAMADOL HCL 50 MG  TABS (TRAMADOL HCL) 1 by mouth qid x per day as needed  forpain  #60 Each x 5   Entered by:   Romualdo Bolk, CMA (AAMA)   Authorized by:   Madelin Headings MD   Signed by:   Romualdo Bolk, CMA (AAMA) on 01/09/2010   Method used:   [facility] to        [facility] [address] [phone]* (retail)       [address]       [place], [place]  [address]       Ph: [phone] or [phone]       Fax: [phone]   RxID:   [id number] IBUPROFEN 800 MG  TABS (IBUPROFEN) 1 by mouth two times a day  #30 x 5   Entered by:   Romualdo Bolk, CMA (AAMA)   Authorized by:   Madelin Headings MD   Signed by:   Romualdo Bolk, CMA (AAMA) on 01/09/2010   Method used:   [facility]ronically to        [facility] [address] [phone]* (retail)       [address]       [place], [place]  [address]       Ph: [phone] or [phone]       Fax: [phone]   RxID:   [id number] MICARDIS HCT 80-25 MG  TABS (TELMISARTAN-HCTZ) 1 by mouth once daily  #30 x 5   Entered by:   Romualdo Bolk, CMA (AAMA)   Authorized by:   Madelin Headings MD   Signed by:   Romualdo Bolk, CMA (AAMA) on 01/09/2010   Method used:   Electronically to        [facility] [address] [phone]* (retail)       [address]       [place], [place]  [address]       Ph: [phone] or [phone]       Fax: [phone]   RxID:   [id number] NEXIUM 40 MG CPDR (ESOMEPRAZOLE MAGNESIUM) Take 1 capsule by mouth once a day  #30 x 5   Entered by:   Romualdo Bolk, CMA (AAMA)   Authorized by:   Madelin Headings MD   Signed by:   Romualdo Bolk, CMA (AAMA) on 01/09/2010   Method used:   Electronically to        [facility] [address] [phone]* (retail)       [address]       [place], [place]  [address]  Ph: [phone] or [phone]       Fax: [phone]   RxID:   [id number] CRESTOR 10 MG TABS (ROSUVASTATIN CALCIUM) 1 tablet every night  #30 x 5   Entered by:   Romualdo Bolk, CMA (AAMA)   Authorized by:   Madelin Headings MD   Signed by:   Romualdo Bolk, CMA (AAMA) on 01/09/2010   Method used:   Electronically to        [facility] [address] [phone]* (retail)       [address]       [place], [place]  [address]       Ph: [phone] or [phone]       Fax: [phone]   RxID:   [id number]

## 2010-10-10 NOTE — Letter (Signed)
Summary: [facility]  [facility]   Imported By: Maryln Gottron 06/27/2010 12:43:25  _____________________________________________________________________  External Attachment:    Type:   Image     Comment:   External Document

## 2010-10-10 NOTE — Progress Notes (Signed)
Summary: refill   Phone Note From Pharmacy   Caller: [facility] Reason for Call: Needs renewal Details for Reason: Klor Con Initial call taken by: Romualdo Bolk, CMA Duncan Dull),  March 22, 2010 4:31 PM  Follow-up for Phone Call        Rx sent to pharmacy Follow-up by: Romualdo Bolk, CMA Duncan Dull),  March 22, 2010 4:32 PM    Prescriptions: KLOR-CON M20 20 MEQ  TBCR (POTASSIUM CHLORIDE CRYS CR) 2 by mouth once daily  #60 Each x 1   Entered by:   Romualdo Bolk, CMA (AAMA)   Authorized by:   Madelin Headings MD   Signed by:   Romualdo Bolk, CMA (AAMA) on 03/22/2010   Method used:   Electronically to        [facility]  [phone]* (retail)       [address]       [place], [place]  [address]       Ph: [phone] or [phone]       Fax: [phone]   RxID:   [id number]

## 2010-10-10 NOTE — Progress Notes (Signed)
Summary: LM for pt to call back  ---- Converted from flag ---- ---- 01/09/2010 9:13 AM, Madelin Headings MD wrote: forgot to tell her that we will  need to get her to [facility] referral for  anemia and colonscopy   now that she has medicare and insurance  ( Can be done after her surgery ).  Please let her know this. ------------------------------  LMTOCB Romualdo Bolk, CMA (AAMA)  Jan 11, 2010 11:33 AM  Pt aware and order sent to [facility]. Romualdo Bolk, CMA (AAMA)  Jan 11, 2010 11:41 AM

## 2010-10-10 NOTE — Progress Notes (Signed)
Summary: refill  Phone Note Call from Patient Call back at [facility] Phone [phone]   Caller: Patient Summary of Call: refill on vit d 50,000 Initial call taken by: Romualdo Bolk, CMA Duncan Dull),  May 23, 2010 3:52 PM  Follow-up for Phone Call        Pt states that she has been taking this weekly. Rx sent to pharmacy. Pt to have  BMP   and  Hg HCt done on 10/3. Would you like her to have a Vit d level done as well? Last Vit d level was checked 10/04/09   Follow-up by: Romualdo Bolk, CMA Duncan Dull),  May 23, 2010 3:57 PM  Additional Follow-up for Phone Call Additional follow up Details #1::        Per Dr. Fabian Sharp- Okay to do Vit d level. Order put in IDX Additional Follow-up by: Romualdo Bolk, CMA Duncan Dull),  May 23, 2010 5:16 PM    Prescriptions: VITAMIN D (ERGOCALCIFEROL) 50000 UNIT CAPS (ERGOCALCIFEROL) 1 by mouth weekly  #12 x 0   Entered by:   Romualdo Bolk, CMA (AAMA)   Authorized by:   Madelin Headings MD   Signed by:   Romualdo Bolk, CMA (AAMA) on 05/23/2010   Method used:   Electronically to        [facility] [address] [phone]* (retail)       [address]       [place], [place]  [address]       Ph: [phone] or [phone]       Fax: [phone]   RxID:   [id number]

## 2010-10-10 NOTE — Progress Notes (Signed)
Summary: colon  Phone Note Call from Patient Call back at Home Phone [phone]   Caller: vm Call For: shannon Summary of Call: Still in rehab center.  Need to cancel appt nurse colon Fri.  Name & number so I can reschedule.   Initial call taken by: Rudy Jew, RN,  Feb 07, 2010 3:06 PM  Follow-up for Phone Call        Left detailed message for pt with name and number of [facility] and reminded pt of the no-show policy. Follow-up by: Corky Mull,  Feb 07, 2010 4:46 PM

## 2010-10-10 NOTE — Miscellaneous (Signed)
Summary: [facility] Previsit/prep  Clinical Lists Changes  Medications: Added new medication of DULCOLAX 5 MG  TBEC (BISACODYL) Day before procedure take 2 at 3pm and 2 at 8pm. - Signed Added new medication of METOCLOPRAMIDE HCL 10 MG  TABS (METOCLOPRAMIDE HCL) As per prep instructions. - Signed Added new medication of MIRALAX   POWD (POLYETHYLENE GLYCOL 3350) As per prep  instructions. - Signed Rx of DULCOLAX 5 MG  TBEC (BISACODYL) Day before procedure take 2 at 3pm and 2 at 8pm.;  #4 x 0;  Signed;  Entered by: Wyona Almas RN;  Authorized by: Hart Carwin MD;  Method used: Electronically to [facility] [address] [phone]*, [address], [place], [place], [place]  [address], Ph: [phone] or [phone], Fax: [phone] Rx of METOCLOPRAMIDE HCL 10 MG  TABS (METOCLOPRAMIDE HCL) As per prep instructions.;  #2 x 0;  Signed;  Entered by: Wyona Almas RN;  Authorized by: Hart Carwin MD;  Method used: Electronically to [facility]  [facility] [address] [phone]*, [address], [place], [place], [place]  [address], Ph: [phone] or [phone], Fax: [phone] Rx of MIRALAX   POWD (POLYETHYLENE GLYCOL 3350) As per prep  instructions.;  #255gm x 0;  Signed;  Entered by: Wyona Almas RN;  Authorized by: Hart Carwin MD;  Method used: Electronically to [facility]  [facility] [address] [phone]*, [address] [place], [place], [place]  [address], Ph: [phone] or [phone], Fax: [phone] Observations: Added new observation of ALLERGY REV: Done (02/17/2010 13:11)    Prescriptions: MIRALAX   POWD (POLYETHYLENE GLYCOL 3350) As per prep  instructions.  #255gm x 0   Entered by:   Wyona Almas RN   Authorized by:   Hart Carwin MD   Signed by:   Wyona Almas RN on 02/17/2010   Method used:   Electronically to        [facility]  [facility] [address] [phone]* (retail)       [address]       [place], [place]  [address]       Ph: [phone] or [phone]       Fax:  [phone]   RxID:   [id number] METOCLOPRAMIDE HCL 10 MG  TABS (METOCLOPRAMIDE HCL) As per prep instructions.  #2 x 0   Entered by:   Wyona Almas RN   Authorized by:   Hart Carwin MD   Signed by:   Wyona Almas RN on 02/17/2010   Method used:   Electronically to        [facility]  [facility] [address] [phone]* (retail)       [address]       [place], [place]  [address]       Ph: [phone] or [phone]       Fax: [phone]   RxID:   [id number] DULCOLAX 5 MG  TBEC (BISACODYL) Day before procedure take 2 at 3pm and 2 at 8pm.  #4 x 0   Entered by:   Wyona Almas RN   Authorized by:   Hart Carwin MD   Signed by:   Wyona Almas RN on 02/17/2010   Method used:   Electronically to        [facility]  [facility] [address] [phone]* (retail)       [address]       [facility],  [place]  [address]       Ph: [phone] or [phone]       Fax: [phone]   RxID:   [id number]

## 2010-10-10 NOTE — Letter (Signed)
Summary: [facility]  [facility]   Imported By: Maryln Gottron 01/26/2010 10:45:05  _____________________________________________________________________  External Attachment:    Type:   Image     Comment:   External Document

## 2010-10-10 NOTE — Assessment & Plan Note (Signed)
Summary: follow up/ssc   Vital Signs:  Patient profile:   60 year old female Menstrual status:  postmenopausal Weight:      248 pounds Pulse rate:   100 / minute BP sitting:   120 / 80  (right arm) Cuff size:   large  Vitals Entered By: Romualdo Bolk, CMA (AAMA) (June 19, 2010 1:36 PM) CC: Follow-up visit on labs, Hypertension Management   History of Present Illness: [name]  comes in for follow up of  ht renal funcxtion and anemia.   To have a right knee replacement  per   Dr Luiz Blare  on October 14th.  Since last visit has been doing ok and no bleeding   increasing gi distress. had endoscopy and no ulcers or strictures.      BP doing ok and no  se of meds   Hypertension History:      She denies headache, chest pain, palpitations, dyspnea with exertion, orthopnea, PND, peripheral edema, visual symptoms, neurologic problems, syncope, and side effects from treatment.  She notes no problems with any antihypertensive medication side effects.        Positive major cardiovascular risk factors include female age 9 years old or older, hyperlipidemia, and hypertension.  Negative major cardiovascular risk factors include non-tobacco-user status.        Further assessment for target organ damage reveals no history of ASHD.     Preventive Screening-Counseling & Management  Alcohol-Tobacco     Alcohol drinks/day: 0     Smoking Status: never  Caffeine-Diet-Exercise     Caffeine use/day: 2     Does Patient Exercise: no  Current Medications (verified): 1)  Crestor 10 Mg Tabs (Rosuvastatin Calcium) .[name].. 1 Tablet Every Night 2)  Lamotrigine 200 Mg  Tabs (Lamotrigine) .[name].. 1 By Mouth Once Daily 3)  Nexium 40 Mg Cpdr (Esomeprazole Magnesium) .... Take 1 Capsule By Mouth Two Times A Day 4)  Seroquel 100 Mg Tabs (Quetiapine Fumarate) .[name].. 1 1/2 Tabs Once Daily 5)  Budeprion Xl 150 Mg Xr24h-Tab (Bupropion Hcl) .[name].. 1 By Mouth Once Daily 6)  Micardis Hct 80-25 Mg  Tabs  (Telmisartan-Hctz) .[name].. 1 By Mouth Once Daily 7)  Ibuprofen 800 Mg  Tabs (Ibuprofen) .[name].. 1 By Mouth Two Times A Day 8)  Tramadol Hcl 50 Mg  Tabs (Tramadol Hcl) .[name].. 1 By Mouth Qid X Per Day As Needed  Forpain 9)  Klor-Con M20 20 Meq  Tbcr (Potassium Chloride Crys Cr) .... 2 By Mouth Once Daily 10)  Geodon 20 Mg Caps (Ziprasidone Hcl) .[name].. 1 By Mouth Qam and 2 Hs 11)  Lexapro 10 Mg Tabs (Escitalopram Oxalate) .[name].. 1 By Mouth At Bedtime 12)  Vitamin D (Ergocalciferol) 50000 Unit Caps (Ergocalciferol) .[name].. 1 By Mouth Weekly 13)  Ferrous Sulfate 325 (65 Fe) Mg  Tabs (Ferrous Sulfate) 14)  Colace 100 Mg Caps (Docusate Sodium) 15)  Nexium 40 Mg Cpdr (Esomeprazole Magnesium) .... Take 1 Tab 30 Min Prior To Breakfast 16)  Nexium 40 Mg  Cpdr (Esomeprazole Magnesium) .[name].. 1 Capsule Each Day 30 Minutes Before Meal  Allergies (verified): 1)  Aspirin (Aspirin)  Past History:  Past medical, surgical, family and social histories (including risk factors) reviewed, and no changes noted (except as noted below).  Past Medical History: Reviewed history from 04/13/2010 and no changes required. Bil knee DJD, Obsessive d/o pf biting finger/toe nails, tx for h.pylori  4/05 Depression, sleep Hyperlipidemia Hypertension hyperglycemia, fasting  Colonic polyps, hx of,  04/05,  also  July 2011/ADENOMATOUS  diverticulosis G5P3  DJD back knees minimal spondyloesthesis L4-5    Consults Dr. Allyne Gee- Psych Dr. Michael Boston Someone for her memory that Dr. Sandria Manly sent her to.  Past Surgical History: Reviewed history from 04/13/2010 and no changes required. Bil carpal tunnel release 1998  bil knee arthroscopy 6/00  endometrial bx 1/03 - benign proliferative - 09/17/2001 Tubal ligation 1980`s   [place] - fibroids sl. Larger - 01/09/2003 G5P3  Knee surgery  left  2011 tkr  Past History:  Care Management: Psychologist: [facility]: Merlene Morse  Family History: Reviewed history from 04/13/2010 and  no changes required. father-unknown past medical history, mom deceased lung CA,HTN, sisters:heart murmur, one with colon cancer  HT  Aunt alzheiners.   ? no parkinsons   Family History of Colon Cancer: Sister Family History of Diabetes: Great Grandmother  Social History: Reviewed history from 04/13/2010 and no changes required. 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  helping out  Patient has never smoked. -2 cups daily Alcohol Use - no Daily Caffeine Use Illicit Drug Use - no Patient does not get regular exercise.   Review of Systems  The patient denies anorexia, fever, weight loss, weight gain, vision loss, dyspnea on exertion, prolonged cough, hemoptysis, melena, hematochezia, muscle weakness, abnormal bleeding, enlarged lymph nodes, and angioedema.    Physical Exam  General:  Well-developed,well-nourished,in no acute distress; alert,appropriate and cooperative throughout examination Head:  normocephalic and atraumatic.   Neck:  No deformities, masses, or tenderness noted. Lungs:  Normal respiratory effort, chest expands symmetrically. Lungs are clear to auscultation, no crackles or wheezes. Heart:  Normal rate and regular rhythm. S1 and S2 normal without gallop, murmur, click, rub or other extra sounds. Pulses:  pulses intact without delay   Neurologic:  stiff antalgic gait    no  acute changes   rednesses  Skin:  turgor normal, color normal, no ecchymoses, and no petechiae.   Cervical Nodes:  No lymphadenopathy noted Psych:  Oriented X3, normally interactive, good eye contact, not anxious appearing, and not depressed appearing.     Impression & Recommendations:  Problem # 1:  OSTEOARTHRITIS (ICD-715.9) pre surgery    medical problems are stable for surgery The following medications were removed from the  medication list:    Percocet 5-325 Mg Tabs (Oxycodone-acetaminophen) Her updated medication list for this problem includes:    Ibuprofen 800 Mg Tabs (Ibuprofen) .[name]... 1 by mouth two times a day    Tramadol Hcl 50 Mg Tabs (Tramadol hcl) .[name]... 1 by mouth qid x per day as needed  forpain  Problem # 2:  ANEMIA, MILD (ICD-285.9)  Her updated medication list for this problem includes:    Ferrous Sulfate 325 (65 Fe) Mg Tabs (Ferrous sulfate)  Hgb: 11.7 (06/12/2010)   Hct: 34.9 (06/12/2010)   Platelets: 276.0 (06/12/2010) RBC: 3.59 (06/12/2010)   RDW: 13.3 (06/12/2010)   WBC: 4.8 (06/12/2010) MCV: 97.2 (06/12/2010)   MCHC: 33.5 (06/12/2010) Ferritin: 86.7 (04/10/2010) Iron: 64 (04/10/2010)   % Sat: 25.9 (04/10/2010) B12: 414 (07/19/2009)   TSH: 3.56 (10/04/2009)  Problem # 3:  HYPERTENSION (ICD-401.9)  Her updated medication list for this problem includes:    Micardis Hct 80-25 Mg Tabs (Telmisartan-hctz) .[name]... 1 by mouth once daily  BP today: 120/80 Prior BP: 130/80 (04/13/2010)  10 Yr Risk  Heart Disease: 4 % Prior 10 Yr Risk Heart Disease: 6 % (04/10/2010)  Labs Reviewed: K+: 3.8 (06/12/2010) Creat: : 1.5 (06/12/2010)   Chol: 126 (10/04/2009)   HDL: 78.30 (10/04/2009)   LDL: 41 (10/04/2009)   TG: 34.0 (10/04/2009)  Problem # 4:  RENAL INSUFFICIENCY  CR 1.5 (ICD-588.9) no change   Problem # 5:  VITAMIN D DEFICIENCY (ICD-268.9) Assessment: Improved  Complete Medication List: 1)  Crestor 10 Mg Tabs (Rosuvastatin calcium) .[name].. 1 tablet every night 2)  Lamotrigine 200 Mg Tabs (Lamotrigine) .[name].. 1 by mouth once daily 3)  Nexium 40 Mg Cpdr (Esomeprazole magnesium) .... Take 1 capsule by mouth two times a day 4)  Seroquel 100 Mg Tabs (Quetiapine fumarate) .[name].. 1 1/2 tabs once daily 5)  Budeprion Xl 150 Mg Xr24h-tab (Bupropion hcl) .[name].. 1 by mouth once daily 6)  Micardis Hct 80-25 Mg Tabs (Telmisartan-hctz) .[name].. 1 by mouth once daily 7)  Ibuprofen 800 Mg Tabs (Ibuprofen) .[name].. 1 by mouth  two times a day 8)  Tramadol Hcl 50 Mg Tabs (Tramadol hcl) .[name].. 1 by mouth qid x per day as needed  forpain 9)  Klor-con M20 20 Meq Tbcr (Potassium chloride crys cr) .... 2 by mouth once daily 10)  Geodon 20 Mg Caps (Ziprasidone hcl) .[name].. 1 by mouth qam and 2 hs 11)  Lexapro 10 Mg Tabs (Escitalopram oxalate) .[name].. 1 by mouth at bedtime 12)  Vitamin D (ergocalciferol) 50000 Unit Caps (Ergocalciferol) .[name].. 1 by mouth weekly 13)  Ferrous Sulfate 325 (65 Fe) Mg Tabs (Ferrous sulfate) 14)  Colace 100 Mg Caps (Docusate sodium) 15)  Nexium 40 Mg Cpdr (Esomeprazole magnesium) .... Take 1 tab 30 min prior to breakfast 16)  Nexium 40 Mg Cpdr (Esomeprazole magnesium) .[name].. 1 capsule each day 30 minutes before meal  Other Orders: Flu Vaccine 52yrs + MEDICARE PATIENTS (E4540) Administration Flu vaccine - MCR (J8119)  Hypertension Assessment/Plan:      The patient's hypertensive risk group is category B: At least one risk factor (excluding diabetes) with no target organ damage.  Her calculated 10 year risk of coronary heart disease is 4 %.  Today's blood pressure is 120/80.  Her blood pressure goal is < 140/90.  Patient Instructions: 1)  Please schedule a follow-up appointment in 4 -6 months or as needed  2)  Will send copy of labs to DR.   Graves  Flu Vaccine Consent Questions     Do you have a history of severe allergic reactions to this vaccine? no    Any prior history of allergic reactions to egg and/or gelatin? no    Do you have a sensitivity to the preservative Thimersol? no    Do you have a past history of Guillan-Barre Syndrome? no    Do you currently have an acute febrile illness? no    Have you ever had a severe reaction to latex? no    Vaccine information given and explained to patient? yes    Are you currently pregnant? no    Lot Number:[id number]   Exp Date:03/10/2011   Site Given  Left Deltoid IMflu1 Romualdo Bolk, CMA (AAMA)  June 19, 2010 1:52 PM

## 2010-10-10 NOTE — Assessment & Plan Note (Signed)
Summary: CPX//PAP//LH   Vital Signs:  Patient profile:   60 year old female Menstrual status:  postmenopausal Height:      65.5 inches Weight:      260 pounds Pulse rate:   66 / minute BP sitting:   120 / 90  (right arm) Cuff size:   large  Vitals Entered By: Romualdo Bolk, CMA (AAMA) (October 11, 2009 10:01 AM) CC: CPX with pap   History of Present Illness: [name]  comes in  for preventive visit  . has multiple medical problems  on medications  fairly stable.   Psych Depression    : seeing   [facility] Doc  .recent increase in the abilify.   Ht :   ok  LIPIds: No se of  meds.   GERD Nexium  Pain   med  2 x per day.     Knees bother her .   pain taking tramadol two times a day still hurts.     Preventive Care Screening  Prior Values:    Pap Smear:  Normal (06/10/2006)    Mammogram:  Normal Bilateral (12/10/2006)    Hemoccult:  Done. (09/15/2004)    Last Tetanus Booster:  Done. (06/14/2003)   Preventive Screening-Counseling & Management  Alcohol-Tobacco     Alcohol drinks/day: 0     Smoking Status: never  Caffeine-Diet-Exercise     Caffeine use/day: 2     Does Patient Exercise: no  Hep-HIV-STD-Contraception     Dental Visit-last 6 months no     Sun Exposure-Excessive: no  Safety-Violence-Falls     Seat Belt Use: yes     Smoke Detectors: yes  Comments: financial no insurance   EKG  Procedure date:  10/11/2009  Findings:      Normal sinus rhythm with rate of:    Current Medications (verified): 1)  Crestor 10 Mg Tabs (Rosuvastatin Calcium) .[name].. 1 Tablet Every Night 2)  Lamotrigine 200 Mg  Tabs (Lamotrigine) .[name].. 1 By Mouth Once Daily Dr. Nolen Mu 3)  Nexium 40 Mg Cpdr (Esomeprazole Magnesium) .... Take 1 Capsule By Mouth Once A Day 4)  Seroquel 200 Mg Tabs (Quetiapine Fumarate) .[name].. 1 Qam and 2 Qhs 5)  Budeprion Sr 150 Mg  Tb12 (Bupropion Hcl) .... 2  By Mouth Once Daily Dr. Nolen Mu 6)  Micardis Hct 80-25 Mg  Tabs  (Telmisartan-Hctz) .[name].. 1 By Mouth Once Daily 7)  Ibuprofen 800 Mg  Tabs (Ibuprofen) .[name].. 1 By Mouth Two Times A Day 8)  Tramadol Hcl 50 Mg  Tabs (Tramadol Hcl) .[name].. 1 By Mouth Qid X Per Day As Needed  Forpain 9)  Klor-Con M20 20 Meq  Tbcr (Potassium Chloride Crys Cr) .... 2 By Mouth Once Daily 10)  Abilify 20 Mg Tabs (Aripiprazole) .[name].. 1 By Mouth Once Daily 11)  Lexapro 10 Mg Tabs (Escitalopram Oxalate) .[name].. 1 By Mouth At Bedtime 12)  Vitamin D (Ergocalciferol) 50000 Unit Caps (Ergocalciferol) .[name].. 1 By Mouth Weekly  Allergies (verified): 1)  Aspirin (Aspirin)  Past History:  Past medical, surgical, family and social histories (including risk factors) reviewed, and no changes noted (except as noted below).  Past Medical History: Bil knee DJD, Obsessive d/o pf biting finger/toe nails, tx for h.pylori  4/05 Depression, sleep Hyperlipidemia Hypertension hyperglycemia, fasting  Colonic polyps, hx of,  04/05 G5P3  DJD back knees minimal spondyloesthesis L4-5     Consults Dr. Allyne Gee- Psych Dr. Michael Boston Someone for her memory that Dr. Sandria Manly sent  her to.  Past Surgical History: Reviewed history from 05/21/2008 and no changes required. Bil carpal tunnel release 1998 -, bil knee arthroscopy 6/00 -, endometrial bx 1/03 - benign proliferative - 09/17/2001, Tubal ligation 1980`s -, [place] - fibroids sl. Larger - 01/09/2003  G5P3   Family History: Reviewed history from 09/07/2008 and no changes required. father-unknown past medical history, mom deceased lung CA, DM2, HTN, sisters:heart murmur, one with colon cancer  HT   COLON cancer  Aunt alzheiners.   ? no parkinsons    Social History: Reviewed history from 05/27/2009 and no changes required. 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  The patient denies anorexia, fever, weight loss, vision loss, decreased hearing, hoarseness, chest pain, syncope, dyspnea on exertion, peripheral edema, prolonged cough, abdominal pain, melena, hematochezia, severe indigestion/heartburn, hematuria, muscle weakness, suspicious skin lesions, difficulty walking, unusual weight change, abnormal bleeding, enlarged lymph nodes, angioedema, and breast masses.         Nexium helps  no gerd symptom  Physical Exam General Appearance: well developed, well nourished, no acute distress Eyes: conjunctiva and lids normal, PERRLA, EOMI, WNL Ears, Nose, Mouth, Throat: TM clear, nares clear, oral exam WNL Neck: supple, no lymphadenopathy, no thyromegaly, no JVD Respiratory: clear to auscultation and percussion, respiratory effort normal Cardiovascular: regular rate and rhythm, S1-S2, no murmur, rub or gallop, no bruits, peripheral pulses normal and symmetric, no cyanosis, clubbing, edema or varicosities Chest: no scars, masses, tenderness; no asymmetry, skin changes, nipple discharge   Gastrointestinal: soft, non-tender; no hepatosplenomegaly, masses; active bowel sounds all quadrants, guaiac negative stool; no masses, tenderness, hemorrhoids  Genitourinary: no vaginal discharge, lesions; no masses or tenderness  PAP done  Lymphatic: no cervical, axillary or inguinal adenopathy Musculoskeletal: gait normal, muscle tone and strength WNL, no joint swelling, effusions, discoloration, crepitus  Skin: clear, good turgor, color WNL, no rashes, lesions, or ulcerations some callous on foot  no ulcers  toe nails thickened  Neurologic: normal mental status, normal reflexes, normal strength, sensation, and motion Psychiatric: alert; oriented to person, place and time Other Exam:  EKG NSR   labs reviewed     Impression & Recommendations:  Problem # 1:   PREVENTIVE HEALTH CARE (ICD-V70.0)  Discussed nutrition,exercise,diet,healthy weight, vitamin D and calcium.     needs mammo gram and colonscopy  but  No insuance until April when gets is eligible or MEdicare.  PAP done today   Orders: EKG w/ Interpretation (93000)  Problem # 2:  ANEMIA, MILD (ICD-285.9) low iron saturation noted last year.   no bleeding  but need colon etc eventually.      Problem # 3:  HYPERTENSION (ICD-401.9)  Her updated medication list for this problem includes:    Micardis Hct 80-25 Mg Tabs (Telmisartan-hctz) .[name]... 1 by mouth once daily cr stable    follow  BP today: 120/90 Prior BP: 120/90 (05/27/2009)  Prior 10 Yr Risk Heart Disease: 6 % (05/27/2009)  Labs Reviewed: K+: 4.3 (10/04/2009) Creat: : 1.2 (10/04/2009)   Chol: 126 (10/04/2009)   HDL: 78.30 (10/04/2009)   LDL: 41 (10/04/2009)   TG: 34.0 (10/04/2009)  Orders: EKG w/ Interpretation (93000)  Problem # 4:  ROUTINE GYNECOLOGICAL EXAMINATION (ICD-V72.31) pap done  Problem # 5:  HYPERLIPIDEMIA (ICD-272.4) Assessment: Unchanged continue Her updated medication list for this problem includes:    Crestor 10 Mg Tabs (Rosuvastatin calcium) .[name]... 1 tablet every night  Labs Reviewed: SGOT: 23 (10/04/2009)   SGPT: 20 (10/04/2009)  Prior 10 Yr Risk Heart Disease: 6 % (05/27/2009)   HDL:78.30 (10/04/2009), 83.90 (05/27/2009)  LDL:41 (10/04/2009), 70 (05/27/2009)  Chol:126 (10/04/2009), 160 (05/27/2009)  Trig:34.0 (10/04/2009), 30.0 (05/27/2009)  Problem # 6:  DEGENERATIVE JOINT DISEASE (ICD-715.90) problematic    obesity contributing Her updated medication list for this problem includes:    Ibuprofen 800 Mg Tabs (Ibuprofen) .[name]... 1 by mouth two times a day    Tramadol Hcl 50 Mg Tabs (Tramadol hcl) .[name]... 1 by mouth qid x per day as needed  forpain  Problem # 7:  HYPERGLYCEMIA, FASTING (ICD-790.29) Assessment: Improved  Problem # 8:  GERD (ICD-530.81) stable  Her updated medication list for this  problem includes:    Nexium 40 Mg Cpdr (Esomeprazole magnesium) .[name]... Take 1 capsule by mouth once a day  Complete Medication List: 1)  Crestor 10 Mg Tabs (Rosuvastatin calcium) .[name].. 1 tablet every night 2)  Lamotrigine 200 Mg Tabs (Lamotrigine) .[name].. 1 by mouth once daily dr. Nolen Mu 3)  Nexium 40 Mg Cpdr (Esomeprazole magnesium) .... Take 1 capsule by mouth once a day 4)  Seroquel 200 Mg Tabs (Quetiapine fumarate) .[name].. 1 qam and 2 qhs 5)  Budeprion Sr 150 Mg Tb12 (Bupropion hcl) .... 2  by mouth once daily dr. Nolen Mu 6)  Micardis Hct 80-25 Mg Tabs (Telmisartan-hctz) .[name].. 1 by mouth once daily 7)  Ibuprofen 800 Mg Tabs (Ibuprofen) .[name].. 1 by mouth two times a day 8)  Tramadol Hcl 50 Mg Tabs (Tramadol hcl) .[name].. 1 by mouth qid x per day as needed  forpain 9)  Klor-con M20 20 Meq Tbcr (Potassium chloride crys cr) .... 2 by mouth once daily 10)  Abilify 20 Mg Tabs (Aripiprazole) .[name].. 1 by mouth once daily 11)  Lexapro 10 Mg Tabs (Escitalopram oxalate) .[name].. 1 by mouth at bedtime 12)  Vitamin D (ergocalciferol) 50000 Unit Caps (Ergocalciferol) .[name].. 1 by mouth weekly  Patient Instructions: 1)  losing weight will help your knees. 2)  Look into getting mammogram . 3)  Increase iron in your foods( diet)  4)  can take one iron pill a day and or Multivitamin with iron. once a day. 5)  return office visit in 3 month  and we will check   your hg   anemia check

## 2010-10-10 NOTE — Procedures (Signed)
Summary: Upper Endoscopy  Patient: [name] Note: All result statuses are Final unless otherwise noted.  Tests: (1) Upper Endoscopy (EGD)   EGD Upper Endoscopy       DONE     [facility]     [address]     [place], [place]  [address]           ENDOSCOPY PROCEDURE REPORT           PATIENT:  [name], [name]  MR#:  [id number]     BIRTHDATE:  [birth year], 59 yrs. old  GENDER:  female           ENDOSCOPIST:  Hedwig Morton. Juanda Chance, MD     Referred by:  Neta Mends. Panosh, M.D.           PROCEDURE DATE:  04/25/2010     PROCEDURE:  EGD with dilatation over guidewire     ASA CLASS:  Class I     INDICATIONS:  dysphagia food getting stuckx 2 months, chronic     cough           MEDICATIONS:   Versed 5 mg, Fentanyl 50 mcg     TOPICAL ANESTHETIC:  Exactacain Spray           DESCRIPTION OF PROCEDURE:   After the risks benefits and     alternatives of the procedure were thoroughly explained, informed     consent was obtained.  The [facility] [id number] endoscope was     introduced through the mouth and advanced to the second portion of     the duodenum, without limitations.  The instrument was slowly     withdrawn as the mucosa was fully examined.     <<PROCEDUREIMAGES>>           Normal GE junction was noted (see image1 and image6). no definite     stricture Savary dilation over a guidewire 17 mm dilator passed     without resistance  There were multiple polyps identified (see     image2). fundic gland polyps  Otherwise the examination was normal     (see image3, image4, image5, image7, and image8).    Retroflexed     views revealed no abnormalities.    The scope was then withdrawn     from the patient and the procedure completed.           COMPLICATIONS:  None           ENDOSCOPIC IMPRESSION:     1) Normal GE junction     2) Polyps, multiple     3) Otherwise normal examination     s/p passage of 17 mm dilator, no evidence of a stricture     RECOMMENDATIONS:     1) Anti-reflux  regimen to be follow     continue nwxiem 40 mg qd           REPEAT EXAM:  In 0 year(s) for.           ______________________________     Hedwig Morton. Juanda Chance, MD           CC:           n.     eSIGNED:   Hedwig Morton. Brodie at 04/25/2010 02:05 PM           Lenox Ponds, [id number]  Note: An exclamation mark (!) indicates a result that was not dispersed into the flowsheet. Document Creation Date: 04/25/2010 2:06 PM _______________________________________________________________________  [name]  1) Order result status: Final Collection or observation date-time: 04/25/2010 13:58 Requested date-time:  Receipt date-time:  Reported date-time:  Referring Physician:   Ordering Physician: Lina Sar [phone]) Specimen Source:  Source: Launa Grill Order Number: [id number] Lab site:

## 2010-10-10 NOTE — Progress Notes (Signed)
Summary: BP elevated -   Phone Note Call from Patient   Summary of Call: Pts physical therapist states pts BP yesterday was 140/100 after sitting for 15 min it did go down a little. This morning BP sitting was 165/100, Pulse 83, Ox 97. Pt has appt Aug 1st but is concerned that maybe she should be seen earlier? Initial call taken by: Josph Macho RMA,  March 17, 2010 11:25 AM  Follow-up for Phone Call        see next    week.    Follow-up by: Madelin Headings MD,  March 17, 2010 4:58 PM  Additional Follow-up for Phone Call Additional follow up Details #1::        spoke with pt - per dr.Giovannie Scerbo to see next week mon or tues.  KIK Additional Follow-up by: Duard Brady LPN,  March 17, 1609 5:02 PM

## 2010-10-10 NOTE — Letter (Signed)
Summary: Patient Notice- Polyp Results  [facility]  [address] [place], [place] [address]   Phone: [phone]  Fax: [phone]        March 17, 2010 MRN: [id number]    [facility] [address] [place], [place]  [address]    Dear [name],  I am pleased to inform you that the colon polyp(s) removed during your recent colonoscopy was (were) found to be benign (no cancer detected) upon pathologic examination.RThe polyps are adenomatous ( precancerous)  I recommend you have a repeat colonoscopy examination in 5_ years to look for recurrent polyps, as having colon polyps increases your risk for having recurrent polyps or even colon cancer in the future.  Should you develop new or worsening symptoms of abdominal pain, bowel habit changes or bleeding from the rectum or bowels, please schedule an evaluation with either your primary care physician or with me.  Additional information/recommendations:  _x_ No further action with gastroenterology is needed at this time. Please      follow-up with your primary care physician for your other healthcare      needs.  __ Please call [phone] to schedule a return visit to review your      situation.  __ Please keep your follow-up visit as already scheduled.  __ Continue treatment plan as outlined the day of your exam.  Please call us if you are having persistent problems or have questions about your condition that have not been fully answered at this time.  Sincerely,  Hart Carwin MD  This letter has been electronically signed by your physician.  Appended Document: Patient Notice- Polyp Results letter mailed.

## 2010-10-10 NOTE — Assessment & Plan Note (Signed)
Summary: DYSPHAGIA/YF   History of Present Illness Visit Type: Initial Consult Primary GI MD: Sydney Head MD [facility] Primary Provider: Berniece Andreas, MD Requesting Provider: Berniece Andreas, MD Chief Complaint: Patient c/o several months "food getting stuck" towards lower esophagus as well as coughing episodes. She also c/o increasing reflux. History of Present Illness:   [name] 59 YO FEMALE RECENTLY KNOWN TO DR. Juanda Chance FROM COLONOSCOPY  7/11 WHICH SHOWED DIVERTICULOSIS AND SEVERAL POLYPS/ADENOMATOUS.  SHE COMES IN TODAY WITH C/O DYSPHAGIA OVER THE PAST SEVERAL MONTHS. SXS PRIMARILY TO SOLIDS. GENERALLY FEELS FOOD STICKING,BUT WILL GRADUALLY GO DOWN. SHE HAS REGURGITATED. NO REGULAR HEARBURN OR INDIGESTION BUT JUST STARTED ON NEXIUM two times a day FOR A CHRONIC COUGH. SHE DOES NOT COUGH AT NIGHT, AND DOES NOT ASSOCIATE COUGHING WITH EATING.   GI Review of Systems    Reports dysphagia with liquids, dysphagia with solids, and  heartburn.      Denies abdominal pain, acid reflux, belching, bloating, chest pain, loss of appetite, nausea, vomiting, vomiting blood, and  weight loss.        Denies anal fissure, black tarry stools, change in bowel habit, constipation, diarrhea, diverticulosis, fecal incontinence, heme positive stool, hemorrhoids, irritable bowel syndrome, jaundice, light color stool, liver problems, rectal bleeding, and  rectal pain. Preventive Screening-Counseling & Management  Alcohol-Tobacco     Smoking Status: never  Caffeine-Diet-Exercise     Does Patient Exercise: no      Drug Use:  no.      Current Medications (verified): 1)  Crestor 10 Mg Tabs (Rosuvastatin Calcium) .[name].. 1 Tablet Every Night 2)  Lamotrigine 200 Mg  Tabs (Lamotrigine) .[name].. 1 By Mouth Once Daily 3)  Nexium 40 Mg Cpdr (Esomeprazole Magnesium) .... Take 1 Capsule By Mouth Two Times A Day 4)  Seroquel 100 Mg Tabs (Quetiapine Fumarate) .[name].. 1 1/2 Tabs Once Daily 5)  Budeprion Sr 150 Mg  Tb12 (Bupropion  Hcl) .... 2  By Mouth Once Daily 6)  Micardis Hct 80-25 Mg  Tabs (Telmisartan-Hctz) .[name].. 1 By Mouth Once Daily 7)  Ibuprofen 800 Mg  Tabs (Ibuprofen) .[name].. 1 By Mouth Two Times A Day 8)  Tramadol Hcl 50 Mg  Tabs (Tramadol Hcl) .[name].. 1 By Mouth Qid X Per Day As Needed  Forpain 9)  Klor-Con M20 20 Meq  Tbcr (Potassium Chloride Crys Cr) .... 2 By Mouth Once Daily 10)  Geodon 20 Mg Caps (Ziprasidone Hcl) .[name].. 1 By Mouth Qam and 2 Hs 11)  Lexapro 10 Mg Tabs (Escitalopram Oxalate) .[name].. 1 By Mouth At Bedtime 12)  Vitamin D (Ergocalciferol) 50000 Unit Caps (Ergocalciferol) .[name].. 1 By Mouth Weekly 13)  Percocet 5-325 Mg Tabs (Oxycodone-Acetaminophen) 14)  Ferrous Sulfate 325 (65 Fe) Mg  Tabs (Ferrous Sulfate) 15)  Ambien 5 Mg Tabs (Zolpidem Tartrate) .[name].. 1 By Mouth At Bedtime 16)  Colace 100 Mg Caps (Docusate Sodium)  Allergies: 1)  Aspirin (Aspirin)  Past History:  Past Medical History: Bil knee DJD, Obsessive d/o pf biting finger/toe nails, tx for h.pylori  4/05 Depression, sleep Hyperlipidemia Hypertension hyperglycemia, fasting  Colonic polyps, hx of,  04/05,   also  July 2011/ADENOMATOUS  diverticulosis G5P3  DJD back knees minimal spondyloesthesis L4-5    Consults Dr. Allyne Gee- Psych Dr. Michael Boston Someone for her memory that Dr. Sandria Manly sent her to.  Past Surgical History: Bil carpal tunnel release 1998  bil knee arthroscopy 6/00  endometrial bx 1/03 - benign proliferative - 09/17/2001 Tubal ligation 1980`s   [place] - fibroids sl.  Larger - 01/09/2003 G5P3  Knee surgery  left  2011 tkr  Family History: father-unknown past medical history, mom deceased lung CA,HTN, sisters:heart murmur, one with colon cancer  HT  Aunt alzheiners.   ? no parkinsons   Family History of Colon Cancer: Sister Family History of Diabetes: Programme researcher, broadcasting/film/video  Social History: used to work as Diplomatic Services operational officer at [employer] Lives at [employer] herself , single Now on disability not  working for psych reasons  children are now dispensing her meds  .  helping out  Patient has never smoked. -2 cups daily Alcohol Use - no Daily Caffeine Use Illicit Drug Use - no Patient does not get regular exercise.  Drug Use:  no  Review of Systems       The patient complains of anxiety-new, arthritis/joint pain, depression-new, swelling of feet/legs, and urine leakage.  The patient denies allergy/sinus, anemia, back pain, blood in urine, breast changes/lumps, change in vision, confusion, cough, coughing up blood, fainting, fatigue, fever, headaches-new, hearing problems, heart murmur, heart rhythm changes, itching, menstrual pain, muscle pains/cramps, night sweats, nosebleeds, pregnancy symptoms, shortness of breath, skin rash, sleeping problems, sore throat, swollen lymph glands, thirst - excessive , urination - excessive , urination changes/pain, vision changes, and voice change.         SEE HPI  Vital Signs:  Patient profile:   60 year old female Menstrual status:  postmenopausal Height:      65.5 inches Weight:      255 pounds BMI:     41.94 BSA:     2.21 Pulse rate:   100 / minute Pulse rhythm:   regular BP sitting:   130 / 80  (left arm) Cuff size:   large  Vitals Entered By: Lamona Curl CMA Duncan Dull) (April 13, 2010 1:28 PM)  Physical Exam  General:  Well developed, well nourished, no acute distress.obese.   Johnson:  Normocephalic and atraumatic. Eyes:  PERRLA, no icterus. Lungs:  Clear throughout to auscultation. Heart:  Regular rate and rhythm; no murmurs, rubs,  or bruits. Abdomen:  SOFT, LARGE,NONTENDER, NO MASS OR HSM,BS+ Rectal:  NOT DONE Neurologic:  Alert and  oriented x4;  grossly normal neurologically. Psych:  Alert and cooperative. Normal mood and affect.   Impression & Recommendations:  Problem # 1:  OTHER DYSPHAGIA (ICD-787.29) Assessment Deteriorated 59 YO FEMALE WITH SEVERAL MONTH HX OF SOLID FOOD DYSPHAGIA- R/O PEPTIC STRICTURE  PT IS ON  two times a day NEXIUM FOR CHRONIC COUGH-JUST STARTED MED-WILL CONTINUE SCHEDULE FOR EGD WITH ESOPHAGEAL DILATION WITH DR. Hermelinda Medicus DISCUSSED IN DETAIL WITH PT. PT ON SEVERAL PSYCHOTROPICS-DID FINE WITH SEDATION FOR COLONOSCOPY AVOID MOST MEETS,DRY BREAD UNTIL POST PROCEDURE. Orders: EGD SAV (EGD SAV)  Problem # 2:  DIVERTICULOSIS-COLON (ICD-562.10) Assessment: Comment Only  Problem # 3:  COLONIC POLYPS, HX OF (ICD-V12.72) RECENT COLONOSCOPY 7/11-ADENOMATOUS-DUE FOR FOLLOW UP 2016.  Problem # 4:  DEPRESSION (ICD-311) Assessment: Comment Only ? OTHER DX  Patient Instructions: 1)  We have scheduled the Endoscopy with Dr. Lina Sar for 04-25-2010. 2)  Directions and brochure provided. 3)  [facility] Patient Information Guide given to patient. 4)  We have given you samples and sent a prescription for Nexium to your pharmacy [facility]. 5)  Copy sent to :  Sydney Andreas, MD 6)  The medication list was reviewed and reconciled.  All changed / newly prescribed medications were explained.  A complete medication list was provided to the patient /  caregiver. Prescriptions: NEXIUM 40 MG CPDR (ESOMEPRAZOLE MAGNESIUM) Take 1 tab 30 min prior to breakfast  #30 x 3   Entered by:   Lowry Ram NCMA   Authorized by:   Sammuel Cooper PA-c   Signed by:   Lowry Ram NCMA on 04/13/2010   Method used:   [facility]        [facility] [address] [phone][facility] (retail)       [address]       [place], [place]  [address]       Ph: [phone] or [phone]       Fax: [phone]   RxID:   [id number]

## 2010-10-10 NOTE — Letter (Signed)
Summary: [facility]  [facility]   Imported By: Maryln Gottron 03/17/2010 12:59:08  _____________________________________________________________________  External Attachment:    Type:   Image     Comment:   External Document

## 2010-10-10 NOTE — Progress Notes (Signed)
Summary: refill on micardis  Phone Note From Pharmacy   Caller: [facility] Reason for Call: Needs renewal Details for Reason: Micardis Hct 80/25mg  Summary of Call: Pt has an appt May 2. Initial call taken by: Romualdo Bolk, CMA Duncan Dull),  December 26, 2009 4:08 PM  Follow-up for Phone Call        Rx faxed electronically to pharmacy. Follow-up by: Romualdo Bolk, CMA (AAMA),  December 26, 2009 4:09 PM    Prescriptions: MICARDIS HCT 80-25 MG  TABS (TELMISARTAN-HCTZ) 1 by mouth once daily  #90 x 0   Entered by:   Romualdo Bolk, CMA (AAMA)   Authorized by:   Madelin Headings MD   Signed by:   Romualdo Bolk, CMA (AAMA) on 12/26/2009   Method used:   Faxed to ...       [facility] (retail)       [address] [place], [place]  [address]       Ph: [phone]       Fax: [phone]   RxID:   [id number]

## 2010-10-10 NOTE — Progress Notes (Signed)
Summary: rxs  Phone Note Call from Patient Call back at [facility] Phone [phone]   Summary of Call: Need all meds written out on new Rxs x2 each to use with Medicare Part D & going to [facility] drugstore [facility]  Call when ready for pickup.  Call me either way or if any questions.   Initial call taken by: Rudy Jew, RN,  December 27, 2009 2:05 PM  Follow-up for Phone Call        LMTOCB-need more info. Follow-up by: Romualdo Bolk, CMA Duncan Dull),  December 27, 2009 4:27 PM  Additional Follow-up for Phone Call Additional follow up Details #1::        Spoke to pt and she is changing pharmacies to [facility]. Pt is going to call the pharmacy and see if they can do a tranfer to that new store. If not she will call us if she needs help. Additional Follow-up by: Romualdo Bolk, CMA Duncan Dull),  December 27, 2009 4:49 PM

## 2010-10-10 NOTE — Letter (Signed)
Summary: Generic Letter  [facility]  [address] [place], [place] [address]   Phone: [phone]  Fax: [phone]    03/20/2010  Merritt Hubbs  DOB  [birth year] [address] [place], [place]  [address]  To Whom it may concern:  Ms Gural was seen in the office today and her blood pressure was normal taken with a large cuff: in the 120 - 130 range  over 70-80. I have told her to continue with her current Blood Pressure medication. She should be allowed  to do routine rehabilitation Physical Therapy.  Please take her Blood Pressure readings with a large cuff  to ensure accuracy. Call  us if readings are consistently over 160 systolic.   Thank You.           Sincerely,   Berniece Andreas MD

## 2010-10-10 NOTE — Letter (Signed)
Summary: [facility]  [facility]   Imported By: Maryln Gottron 08/10/2010 13:31:21  _____________________________________________________________________  External Attachment:    Type:   Image     Comment:   External Document

## 2010-10-10 NOTE — Procedures (Signed)
Summary: Colonoscopy  Patient: [name] Note: All result statuses are Final unless otherwise noted.  Tests: (1) Colonoscopy (COL)   COL Colonoscopy           DONE     [facility]     [address]     [place], [place]  [address]           COLONOSCOPY PROCEDURE REPORT           PATIENT:  [name], [name]  MR#:  [id number]     BIRTHDATE:  20-Oct-1950, 59 yrs. old  GENDER:  female     ENDOSCOPIST:  Hedwig Morton. Juanda Chance, MD     REF. BY:  Neta Mends. Panosh, M.D.     PROCEDURE DATE:  03/15/2010     PROCEDURE:  Colonoscopy 63[address]     ASA CLASS:  Class I     INDICATIONS:  surveillance and high-risk screening colon 3-4 years     ago, 3 polyps. resords not available     MEDICATIONS:   Versed 10 mg, Fentanyl 75 mcg           DESCRIPTION OF PROCEDURE:   After the risks benefits and     alternatives of the procedure were thoroughly explained, informed     consent was obtained.  Digital rectal exam was performed and     revealed no rectal masses.   The LB [id number] endoscope     was introduced through the anus and advanced to the cecum, which     was identified by the ileocecal valve, without limitations.  The     quality of the prep was good, using MiraLax.  The instrument was     then slowly withdrawn as the colon was fully examined.     <<PROCEDUREIMAGES>>           FINDINGS:  There were multiple polyps identified and removed.     throughout the colon. 15 mm lobulated polyp snared from 20 cm,     rest of the polyps 2-3 mm sessile The polyps were removed using     cold biopsy forceps. Polyp was snared, then cauterized with     monopolar cautery. Retrieval was successful (see image2, image6,     image7, image5, and image8). snare polyp  Moderate diverticulosis     was found in the sigmoid colon (see image3 and image1).  This was     otherwise a normal examination of the colon (see image9 and     image3).   Retroflexed views in the rectum revealed no     abnormalities.    The  scope was then withdrawn from the patient     and the procedure completed.           COMPLICATIONS:  None     ENDOSCOPIC IMPRESSION:     1) Polyps, multiple throughout the colon     2) Moderate diverticulosis in the sigmoid colon     3) Otherwise normal examination     RECOMMENDATIONS:     1) Await biopsy results     2) High fiber diet.     REPEAT EXAM:  In 5 year(s) for.           ______________________________     Hedwig Morton. Juanda Chance, MD           CC:           n.     eSIGNED:   Hedwig Morton. Ceclia Koker at 03/15/2010  02:56 PM           [place], [place], [phone]  Note: An exclamation mark (!) indicates a result that was not dispersed into the flowsheet. Document Creation Date: 03/15/2010 2:56 PM _______________________________________________________________________  (1) Order result status: Final Collection or observation date-time: 03/15/2010 14:50 Requested date-time:  Receipt date-time:  Reported date-time:  Referring Physician:   Ordering Physician: Lina Sar [phone]) Specimen Source:  Source: Launa Grill Order Number: [id number] Lab site:   Appended Document: Colonoscopy     Procedures Next Due Date:    Colonoscopy: 03/2015

## 2010-10-10 NOTE — Assessment & Plan Note (Signed)
Summary: hosp fup/[facility]- [place]/cjr   Vital Signs:  Patient profile:   60 year old female Menstrual status:  postmenopausal Weight:      252 pounds Pulse rate:   78 / minute BP sitting:   120 / 80  (left arm) Cuff size:   large  Vitals Entered By: Romualdo Bolk, CMA (AAMA) (July 19, 2010 12:03 PM) CC: Post Hospital Follow up- Pt is having advance home care follow up her coumdin levels. Level was checked on 11/8 and was fine.    History of Present Illness: [name] comes in today   for follow up of TKR  right and out of rehab at [facility].  She had anemia in hospital    and hg dropped to the 8 range and was  told to take iron.    Last check   of hg was 2 days ago but no records. Iron   three times a day.    No sig effects.  Is getting  home health :   Ot pt and coumadin.   to stop  in 1-2 weeks .  No falling No cp sob .   Bp has been good and no syncope.   Cr  in hosp went to 2 and above range  but  in low 1 at discharge.  Psych : stable and no change in meds .    Preventive Screening-Counseling & Management  Alcohol-Tobacco     Alcohol drinks/day: 0     Smoking Status: never  Caffeine-Diet-Exercise     Caffeine use/day: 2     Does Patient Exercise: no  Current Medications (verified): 1)  Crestor 10 Mg Tabs (Rosuvastatin Calcium) .[name].. 1 Tablet Every Night 2)  Lamotrigine 200 Mg  Tabs (Lamotrigine) .[name].. 1 By Mouth Once Daily 3)  Nexium 40 Mg Cpdr (Esomeprazole Magnesium) .... Take 1 Capsule By Mouth Two Times A Day 4)  Seroquel 100 Mg Tabs (Quetiapine Fumarate) .[name].. 1 1/2 Tabs Once Daily 5)  Budeprion Xl 150 Mg Xr24h-Tab (Bupropion Hcl) .[name].. 1 By Mouth Once Daily 6)  Micardis Hct 80-25 Mg  Tabs (Telmisartan-Hctz) .[name].. 1 By Mouth Once Daily 7)  Ibuprofen 800 Mg  Tabs (Ibuprofen) .[name].. 1 By Mouth Two Times A Day 8)  Tramadol Hcl 50 Mg  Tabs (Tramadol Hcl) .[name].. 1 By Mouth Qid X Per Day As Needed  Forpain 9)  Klor-Con M20 20 Meq  Tbcr (Potassium Chloride  Crys Cr) .... 2 By Mouth Once Daily 10)  Geodon 20 Mg Caps (Ziprasidone Hcl) .[name].. 1 By Mouth Qam and 2 Hs 11)  Lexapro 10 Mg Tabs (Escitalopram Oxalate) .[name].. 1 By Mouth At Bedtime 12)  Vitamin D (Ergocalciferol) 50000 Unit Caps (Ergocalciferol) .[name].. 1 By Mouth Weekly 13)  Ferrous Sulfate 325 (65 Fe) Mg  Tabs (Ferrous Sulfate) 14)  Colace 100 Mg Caps (Docusate Sodium) 15)  Nexium 40 Mg Cpdr (Esomeprazole Magnesium) .... Take 1 Tab 30 Min Prior To Breakfast 16)  Nexium 40 Mg  Cpdr (Esomeprazole Magnesium) .[name].. 1 Capsule Each Day 30 Minutes Before Meal 17)  Percocet 5-325 Mg Tabs (Oxycodone-Acetaminophen) 18)  Coumadin 5 Mg Tabs (Warfarin Sodium) .[name].. 1 By Mouth Once Daily  Allergies (verified): 1)  Aspirin (Aspirin)  Past History:  Past medical, surgical, family and social histories (including risk factors) reviewed, and no changes noted (except as noted below).  Past Medical History: Reviewed history from 04/13/2010 and no changes required. Bil knee DJD, Obsessive d/o pf biting finger/toe nails, tx for  h.pylori  4/05 Depression, sleep Hyperlipidemia Hypertension hyperglycemia, fasting  Colonic polyps, hx of,  04/05,   also  July 2011/ADENOMATOUS  diverticulosis G5P3  DJD back knees minimal spondyloesthesis L4-5    Consults Dr. Allyne Gee- Psych Dr. Michael Boston Someone for her memory that Dr. Sandria Manly sent her to.  Past Surgical History: Bil carpal tunnel release 1998  bil knee arthroscopy 6/00  endometrial bx 1/03 - benign proliferative - 09/17/2001 Tubal ligation 1980`s   [place] - fibroids sl. Larger - 01/09/2003 G5P3  Knee surgery  left  2011 tkr Rt knee replacement 2011  Past History:  Care Management: Psychologist: [facility]: [facility]  Family History: Reviewed history from 04/13/2010 and no changes required. father-unknown past medical history, mom deceased lung CA,HTN, sisters:heart murmur, one with colon cancer  HT  Aunt alzheiners.   ? no  parkinsons   Family History of Colon Cancer: Sister Family History of Diabetes: Great Grandmother  Social History: Reviewed history from 04/13/2010 and no changes required. 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  helping out  Patient has never smoked. -2 cups daily Alcohol Use - no Daily Caffeine Use Illicit Drug Use - no Patient does not get regular exercise.   Review of Systems  The patient denies anorexia, fever, weight loss, weight gain, chest pain, syncope, dyspnea on exertion, prolonged cough, melena, hematochezia, severe indigestion/heartburn, hematuria, muscle weakness, transient blindness, unusual weight change, abnormal bleeding, enlarged lymph nodes, and angioedema.    Physical Exam  General:  Well-developed,well-nourished,in no acute distress; alert,appropriate and cooperative throughout examination using walker Head:  normocephalic and atraumatic.   Eyes:  vision grossly intact.   Neck:  No deformities, masses, or tenderness noted. Lungs:  Normal respiratory effort, chest expands symmetrically. Lungs are clear to auscultation, no crackles or wheezes. Heart:  Normal rate and regular rhythm. S1 and S2 normal without gallop, murmur, click, rub or other extra sounds. short sem  Msk:  wellhealing scar right knee  no redness or  warmth  Pulses:  pulses intact without delay   Extremities:  no clubbing cyanosis or edema  Neurologic:  alert & oriented X3.  walking fairly well with a walker     Skin:  turgor normal, color normal, no petechiae, and no purpura.   Cervical Nodes:  No lymphadenopathy noted Psych:  Oriented X3, normally interactive, and good eye contact.   reviewed [facility] record and labs   [facility]  records NA   Impression & Recommendations:  Problem # 1:  ANEMIA (ICD-285.9) post op   tkr right  dont  know level at rehab so recheck to day but no bleeding  on coumading post op.  Her updated medication list for this problem includes:    Ferrous Sulfate 325 (65 Fe) Mg Tabs (Ferrous sulfate)  Orders: Hgb (85018) Fingerstick (08676)  Problem # 2:  RENAL INSUFFICIENCY  CR 1.5 (ICD-588.9) hx of some decompensation in hospital .   cr went up to 2  range and then down at discharge  Problem # 3:  HYPERTENSION (ICD-401.9) good readings today   no change in meds  Her updated medication list for this problem includes:    Micardis Hct 80-25 Mg Tabs (Telmisartan-hctz) .[name]... 1 by mouth once daily  Problem # 4:  OSTEOARTHRITIS (ICD-715.9) pain not worse  Her updated medication list for this  problem includes:    Ibuprofen 800 Mg Tabs (Ibuprofen) .[name]... 1 by mouth two times a day    Tramadol Hcl 50 Mg Tabs (Tramadol hcl) .[name]... 1 by mouth qid x per day as needed  forpain    Percocet 5-325 Mg Tabs (Oxycodone-acetaminophen)  Problem # 5:  DEPRESSION (ICD-311) stable and no change. Her updated medication list for this problem includes:    Budeprion Xl 150 Mg Xr24h-tab (Bupropion hcl) .[name]... 1 by mouth once daily    Lexapro 10 Mg Tabs (Escitalopram oxalate) .[name]... 1 by mouth at bedtime  Problem # 6:  TOTAL KNEE REPLACEMENT, RIGHT, HX OF (ICD-V43.65) no complications so far.   Complete Medication List: 1)  Crestor 10 Mg Tabs (Rosuvastatin calcium) .[name].. 1 tablet every night 2)  Lamotrigine 200 Mg Tabs (Lamotrigine) .[name].. 1 by mouth once daily 3)  Nexium 40 Mg Cpdr (Esomeprazole magnesium) .... Take 1 capsule by mouth two times a day 4)  Seroquel 100 Mg Tabs (Quetiapine fumarate) .[name].. 1 1/2 tabs once daily 5)  Budeprion Xl 150 Mg Xr24h-tab (Bupropion hcl) .[name].. 1 by mouth once daily 6)  Micardis Hct 80-25 Mg Tabs (Telmisartan-hctz) .[name].. 1 by mouth once daily 7)  Ibuprofen 800 Mg Tabs (Ibuprofen) .[name].. 1 by mouth two times a day 8)  Tramadol Hcl 50 Mg Tabs (Tramadol hcl) .[name].. 1 by mouth qid x per day as  needed  forpain 9)  Klor-con M20 20 Meq Tbcr (Potassium chloride crys cr) .... 2 by mouth once daily 10)  Geodon 20 Mg Caps (Ziprasidone hcl) .[name].. 1 by mouth qam and 2 hs 11)  Lexapro 10 Mg Tabs (Escitalopram oxalate) .[name].. 1 by mouth at bedtime 12)  Vitamin D (ergocalciferol) 50000 Unit Caps (Ergocalciferol) .[name].. 1 by mouth weekly 13)  Ferrous Sulfate 325 (65 Fe) Mg Tabs (Ferrous sulfate) 14)  Colace 100 Mg Caps (Docusate sodium) 15)  Nexium 40 Mg Cpdr (Esomeprazole magnesium) .... Take 1 tab 30 min prior to breakfast 16)  Nexium 40 Mg Cpdr (Esomeprazole magnesium) .[name].. 1 capsule each day 30 minutes before meal 17)  Percocet 5-325 Mg Tabs (Oxycodone-acetaminophen) 18)  Coumadin 5 Mg Tabs (Warfarin sodium) .[name].. 1 by mouth once daily  Patient Instructions: 1)  your hg is 11.1 today  2)  continue iron supplements and BP meds  as usual 3)  return office visit in 1 month or as needed.   Orders Added: 1)  Hgb [85018] 2)  Fingerstick [36416] 3)  Est. Patient Level IV [04540]    Laboratory Results   Blood Tests   Date/Time Recieved: July 19, 2010 1:05 PM  Date/Time Reported: July 19, 2010 1:05 PM    CBC HGB:  11.1 g/dL   (Normal Range: 98.1-19.1 in Males, 12.0-15.0 in Females) Comments: Wynona Canes, CMA  July 19, 2010 1:05 PM

## 2010-10-10 NOTE — Letter (Signed)
Summary: [facility] Instructions  [facility]  [address] [place], [place] [address]   Phone: [phone]  Fax: [phone]       [name]    1951-04-12    MRN: [id number]       Procedure Day Dorna Bloom:  Farrell Ours  02/24/10     Arrival Time:  10:00am      Procedure Time:  11:00am     Location of Procedure:                    _X _  [facility] (4th Floor)    PREPARATION FOR COLONOSCOPY WITH MIRALAX  Starting 5 days prior to your procedure  SUNDAY 02/19/10 do not eat nuts, seeds, popcorn, corn, beans, peas,  salads, or any raw vegetables.  Do not take any fiber supplements (e.g. Metamucil, Citrucel, and Benefiber). ____________________________________________________________________________________________________   THE DAY BEFORE YOUR PROCEDURE         DATE:  THURSDAY  02/23/10  1   Drink clear liquids the entire day-NO SOLID FOOD  2   Do not drink anything colored red or purple.  Avoid juices with pulp.  No orange juice.  3   Drink at least 64 oz. (8 glasses) of fluid/clear liquids during the day to prevent dehydration and help the prep work efficiently.  CLEAR LIQUIDS INCLUDE: Water Jello Ice Popsicles Tea (sugar ok, no milk/cream) Powdered fruit flavored drinks Coffee (sugar ok, no milk/cream) Gatorade Juice: apple, white grape, white cranberry  Lemonade Clear bullion, consomm, broth Carbonated beverages (any kind) Strained chicken noodle soup Hard Candy  4   Mix the entire bottle of Miralax with 64 oz. of Gatorade/Powerade in the morning and put in the refrigerator to chill.  5   At 3:00 pm take 2 Dulcolax/Bisacodyl tablets.  6   At 4:30 pm take one Reglan/Metoclopramide tablet.  7  Starting at 5:00 pm drink one 8 oz glass of the Miralax mixture every 15-20 minutes until you have finished drinking the entire 64 oz.  You should finish drinking prep around 7:30 or 8:00 pm.  8   If you are nauseated, you may take the 2nd  Reglan/Metoclopramide tablet at 6:30 pm.        9    At 8:00 pm take 2 more DULCOLAX/Bisacodyl tablets.     THE DAY OF YOUR PROCEDURE      DATE:  Farrell Ours  02/24/10  You may drink clear liquids until  9:00am   (2 HOURS BEFORE PROCEDURE).   MEDICATION INSTRUCTIONS  Unless otherwise instructed, you should take regular prescription medications with a small sip of water as early as possible the morning of your procedure.   Additional medication instructions: Hold HCTZ the morning of procedure.   Take Benicar and depression medication the morning of procedure.         OTHER INSTRUCTIONS  You will need a responsible adult at least 60 years of age to accompany you and drive you home.   This person must remain in the waiting room during your procedure.  Wear loose fitting clothing that is easily removed.  Leave jewelry and other valuables at home.  However, you may wish to bring a book to read or an iPod/MP3 player to listen to music as you wait for your procedure to start.  Remove all body piercing jewelry and leave at home.  Total time from sign-in until discharge is approximately 2-3 hours.  You should go home directly after your  procedure and rest.  You can resume normal activities the day after your procedure.  The day of your procedure you should not:   Drive   Make legal decisions   Operate machinery   Drink alcohol   Return to work  You will receive specific instructions about eating, activities and medications before you leave.   The above instructions have been reviewed and explained to me by   Wyona Almas RN  February 17, 2010 1:57 PM     I fully understand and can verbalize these instructions _____________________________ Date _______

## 2010-10-10 NOTE — Assessment & Plan Note (Signed)
Summary: fu on bp/kim/njr   Vital Signs:  Patient profile:   60 year old female Menstrual status:  postmenopausal Height:      65.5 inches Weight:      256 pounds BMI:     42.10 Pulse rate:   80 / minute BP sitting:   132 / 80  (left arm) Cuff size:   large  Vitals Entered By: Romualdo Bolk, CMA (AAMA) (March 20, 2010 1:33 PM)  Nutrition Counseling: Patient's BMI is greater than 25 and therefore counseled on weight management options.  Serial Vital Signs/Assessments:  Time      Position  BP       Pulse  Resp  Temp     By 1:45 PM             148/100                        Romualdo Bolk, CMA (AAMA)           R Arm     122/80                         Madelin Headings MD           L Arm     120/70                         Madelin Headings MD           L Arm     120/80                         Madelin Headings MD  Comments: 1:45 PM used a reg cuff. By: Romualdo Bolk, CMA (AAMA)  large cuff sitting By: Madelin Headings MD  standing left large  By: Madelin Headings MD   CC: Follow-up visit on BP- Pt has been taking bp at home with physical therapy person at home., Hypertension Management   History of Present Illness: Alizia Greif comes in today   with her daughter driving because of   problems with BP readings being elevated post of for   her knee surgery . See   PT notes .    Felt to be some from pain  Had Surgery May 13.     still in home   PT . and Dr Luiz Blare is Careers adviser. Is taking  Vicodin  about every 6 hours.  Psych  :onless seroquel  now  no other changes.   Hypertension History:      She complains of peripheral edema, but denies headache, chest pain, palpitations, dyspnea with exertion, orthopnea, PND, visual symptoms, neurologic problems, syncope, and side effects from treatment.  She notes no problems with any antihypertensive medication side effects.        Positive major cardiovascular risk factors include female age 29 years old or older, hyperlipidemia, and  hypertension.  Negative major cardiovascular risk factors include non-tobacco-user status.        Further assessment for target organ damage reveals no history of ASHD.     Preventive Screening-Counseling & Management  Alcohol-Tobacco     Alcohol drinks/day: 0     Smoking Status: never  Caffeine-Diet-Exercise     Caffeine use/day: 2     Does Patient Exercise: no  Current Medications (verified): 1)  Crestor 10 Mg Tabs (Rosuvastatin Calcium) .Marland KitchenMarland KitchenMarland Kitchen  1 Tablet Every Night 2)  Lamotrigine 200 Mg  Tabs (Lamotrigine) .[name].. 1 By Mouth Once Daily Dr. Nolen Mu 3)  Nexium 40 Mg Cpdr (Esomeprazole Magnesium) .... Take 1 Capsule By Mouth Once A Day 4)  Seroquel 100 Mg Tabs (Quetiapine Fumarate) .[name].. 1 1/2 Tabs Once Daily 5)  Budeprion Sr 150 Mg  Tb12 (Bupropion Hcl) .... 2  By Mouth Once Daily Dr. Nolen Mu 6)  Micardis Hct 80-25 Mg  Tabs (Telmisartan-Hctz) .[name].. 1 By Mouth Once Daily 7)  Ibuprofen 800 Mg  Tabs (Ibuprofen) .[name].. 1 By Mouth Two Times A Day 8)  Tramadol Hcl 50 Mg  Tabs (Tramadol Hcl) .[name].. 1 By Mouth Qid X Per Day As Needed  Forpain 9)  Klor-Con M20 20 Meq  Tbcr (Potassium Chloride Crys Cr) .... 2 By Mouth Once Daily 10)  Geodon 20 Mg Caps (Ziprasidone Hcl) .[name].. 1 By Mouth Qam and 2 Hs 11)  Lexapro 10 Mg Tabs (Escitalopram Oxalate) .[name].. 1 By Mouth At Bedtime 12)  Vitamin D (Ergocalciferol) 50000 Unit Caps (Ergocalciferol) .[name].. 1 By Mouth Weekly 13)  Co-Gesic 5-500 Mg Tabs (Hydrocodone-Acetaminophen) 14)  Ferrous Sulfate 325 (65 Fe) Mg  Tabs (Ferrous Sulfate) 15)  Ambien 5 Mg Tabs (Zolpidem Tartrate) .[name].. 1 By Mouth At Bedtime 16)  Colace 100 Mg Caps (Docusate Sodium)  Allergies (verified): 1)  Aspirin (Aspirin)  Past History:  Past medical, surgical, family and social histories (including risk factors) reviewed, and no changes noted (except as noted below).  Past Medical History: Reviewed history from 10/11/2009 and no changes required. Bil knee DJD, Obsessive d/o pf biting  finger/toe nails, tx for h.pylori  4/05 Depression, sleep Hyperlipidemia Hypertension hyperglycemia, fasting  Colonic polyps, hx of,  04/05 G5P3  DJD back knees minimal spondyloesthesis L4-5     Consults Dr. Allyne Gee- Psych Dr. Michael Boston Someone for her memory that Dr. Sandria Manly sent her to.  Past Surgical History: Bil carpal tunnel release 1998 -, bil knee arthroscopy 6/00 -, endometrial bx 1/03 - benign proliferative - 09/17/2001, Tubal ligation 1980`s -, [place] - fibroids sl. Larger - 01/09/2003  G5P3  Knee surgery  left  2011 tkr  Past History:  Care Management: Psychologist: [facility]   Orthopedics: [facility]  Family History: Reviewed history from 10/11/2009 and no changes required. father-unknown past medical history, mom deceased lung CA, DM2, HTN, sisters:heart murmur, one with colon cancer  HT   COLON cancer  Aunt alzheiners.   ? no parkinsons    Social History: Reviewed history from 10/11/2009 and no changes required. 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  helping out   Review of Systems  The patient denies anorexia, fever, chest pain, syncope, dyspnea on exertion, prolonged cough, abdominal pain, melena, hematochezia, hematuria, and abnormal bleeding.    Physical Exam  General:  alert, well-developed, well-nourished, and well-hydrated.   Head:  normocephalic and atraumatic.   Eyes:  vision grossly intact.   Neck:  No deformities, masses, or tenderness noted. Lungs:  normal respiratory effort, no intercostal retractions, and no accessory muscle use.   Heart:  normal rate, regular rhythm, and no murmur.   Msk:  well healed left knee scar no redness or edema   able to arise slowly to  walker   and take steps  Neurologic:  walks with walker  and no focal changes  Skin:   turgor normal, color normal, and no petechiae.   Psych:  Oriented X3, good eye contact, and not anxious appearing.     Impression & Recommendations:  Problem # 1:  HYPERTENSION (ICD-401.9) good today . suspect elevated readings are  from small  reg cuff size and poss pain .  Less likely to be masked Hypertension.   letter wirtten for PT    Her updated medication list for this problem includes:    Micardis Hct 80-25 Mg Tabs (Telmisartan-hctz) .[name]... 1 by mouth once daily  Problem # 2:  TOTAL KNEE REPLACEMENT, LEFT, HX OF (ICD-V43.65) No complications so far .[name] To follow up with Dr Luiz Blare this week     Problem # 3:  DEGENERATIVE JOINT DISEASE (ICD-715.90) righ tknee problematic also at times Her updated medication list for this problem includes:    Ibuprofen 800 Mg Tabs (Ibuprofen) .[name]... 1 by mouth two times a day    Tramadol Hcl 50 Mg Tabs (Tramadol hcl) .[name]... 1 by mouth qid x per day as needed  forpain    Co-gesic 5-500 Mg Tabs (Hydrocodone-acetaminophen)  Problem # 4:  DEPRESSION (ICD-311) stable  Her updated medication list for this problem includes:    Budeprion Sr 150 Mg Tb12 (Bupropion hcl) .[name]... 2  by mouth once daily dr. Nolen Mu    Lexapro 10 Mg Tabs (Escitalopram oxalate) .[name]... 1 by mouth at bedtime  Complete Medication List: 1)  Crestor 10 Mg Tabs (Rosuvastatin calcium) .[name].. 1 tablet every night 2)  Lamotrigine 200 Mg Tabs (Lamotrigine) .[name].. 1 by mouth once daily dr. Nolen Mu 3)  Nexium 40 Mg Cpdr (Esomeprazole magnesium) .... Take 1 capsule by mouth once a day 4)  Seroquel 100 Mg Tabs (Quetiapine fumarate) .[name].. 1 1/2 tabs once daily 5)  Budeprion Sr 150 Mg Tb12 (Bupropion hcl) .... 2  by mouth once daily dr. Nolen Mu 6)  Micardis Hct 80-25 Mg Tabs (Telmisartan-hctz) .[name].. 1 by mouth once daily 7)  Ibuprofen 800 Mg Tabs (Ibuprofen) .[name].. 1 by mouth two times a day 8)  Tramadol Hcl 50 Mg Tabs (Tramadol hcl) .[name].. 1 by mouth qid x per day as needed  forpain 9)  Klor-con M20 20  Meq Tbcr (Potassium chloride crys cr) .... 2 by mouth once daily 10)  Geodon 20 Mg Caps (Ziprasidone hcl) .[name].. 1 by mouth qam and 2 hs 11)  Lexapro 10 Mg Tabs (Escitalopram oxalate) .[name].. 1 by mouth at bedtime 12)  Vitamin D (ergocalciferol) 50000 Unit Caps (Ergocalciferol) .[name].. 1 by mouth weekly 13)  Co-gesic 5-500 Mg Tabs (Hydrocodone-acetaminophen) 14)  Ferrous Sulfate 325 (65 Fe) Mg Tabs (Ferrous sulfate) 15)  Ambien 5 Mg Tabs (Zolpidem tartrate) .[name].. 1 by mouth at bedtime 16)  Colace 100 Mg Caps (Docusate sodium)  Hypertension Assessment/Plan:      The patient's hypertensive risk group is category B: At least one risk factor (excluding diabetes) with no target organ damage.  Her calculated 10 year risk of coronary heart disease is 4 %.  Today's blood pressure is 132/80.  Her blood pressure goal is < 140/90.  Patient Instructions: 1)  letter written   for PT    2)  keep follow up appt in AUgust   3)  no change in meds at this time

## 2010-10-10 NOTE — Letter (Signed)
Summary: Generic Letter  [facility]  [address] [place], [place] [address]   Phone: [phone]  Fax: [phone]    01/09/2010  [name]   dob: 6 29 1952 [address] [place], [place]  [address]    To Whom It May  Concern:  The above patient has been seen in the office and has no  medical contraindications to her knee surgery ( TKR) as planned.  She has  Hypertension and hyperlipidemia controlled  and mild anemia felt to be iron deficiency.   Let us know if you require  more information.              Sincerely,   Berniece Andreas MD

## 2010-10-10 NOTE — Miscellaneous (Signed)
Summary: nexium-rx  Clinical Lists Changes  Medications: Added new medication of NEXIUM 40 MG  CPDR (ESOMEPRAZOLE MAGNESIUM) 1 capsule each day 30 minutes before meal - Signed Rx of NEXIUM 40 MG  CPDR (ESOMEPRAZOLE MAGNESIUM) 1 capsule each day 30 minutes before meal;  #30 x 3;  Signed;  Entered by: Greer Ee RN;  Authorized by: Hart Carwin MD;  Method used: Electronically to [facility] [address] [phone]*, [address] [place], [place], [place]  [address], Ph: [phone] or [phone], Fax: [phone]    Prescriptions: NEXIUM 40 MG  CPDR (ESOMEPRAZOLE MAGNESIUM) 1 capsule each day 30 minutes before meal  #30 x 3   Entered by:   Greer Ee RN   Authorized by:   Hart Carwin MD   Signed by:   Greer Ee RN on 04/25/2010   Method used:   Electronically to        [facility] [address] [phone]* (retail)       [address]       [place], [place]  [address]       Ph: [phone] or [phone]       Fax: [phone]   RxID:   [id number]

## 2010-10-10 NOTE — Progress Notes (Signed)
Summary: BP up after walking and resting for  Phone Note From Other Clinic Call back at [phone]   Caller: Almyra Free at [facility] Summary of Call: Pt is being follow up by [facility] for PT after her knee replacement. Pt's bp was 160/100 after walking and resting for . Pt is currently weaning off pain medication and had a pain on a pain scale of 7. Her diastolic has been running around 90. She is not having any cardiac problems and was told by Almyra Free to monitor her bp and call 911 if she was having any cardiac symptoms.  Initial call taken by: Romualdo Bolk, CMA Duncan Dull),  March 01, 2010 1:37 PM  Follow-up for Phone Call        Per Dr. Fabian Sharp- Continue to monitor her bp but may add another medication if bp continues to go up. Pt aware of this. Follow-up by: Romualdo Bolk, CMA Duncan Dull),  March 01, 2010 5:17 PM

## 2010-10-10 NOTE — Letter (Signed)
Summary: [facility]  [facility]   Imported By: Maryln Gottron 02/15/2010 13:59:46  _____________________________________________________________________  External Attachment:    Type:   Image     Comment:   External Document

## 2010-10-10 NOTE — Assessment & Plan Note (Signed)
Summary: 3 month fup//ccm   Vital Signs:  Patient profile:   60 year old female Menstrual status:  postmenopausal Weight:      250 pounds Pulse rate:   60 / minute BP sitting:   140 / 70  (right arm) Cuff size:   large  Vitals Entered By: Romualdo Bolk, CMA (AAMA) (April 10, 2010 8:21 AM)  Serial Vital Signs/Assessments:  Time      Position  BP       Pulse  Resp  Temp     By           R Arm     120/80                         Madelin Headings MD           L Arm     116/78                         Madelin Headings MD  Comments: large cuff sitting  By: Madelin Headings MD   CC: follow-up visit, Hypertension Management   History of Present Illness: [name] comes in today  for follow up of multiple medical problems particularly her BP and anemia . Since last visit she has had  colonscopy   and had polyp and on 5 year recall . Knee:TKR:  Percocet 0-2 per day.    to go back to ortho next week.   right knee is mpore problematic with pain currently .Tramadol   also 2-3 x per day.    New problem :  Cough for months  : terrible cough : comes and goes   .  drank some  water yesterday  and noted  dysphagia   things getting stuck  in mid chest  . no vomiting or sob.[name]   Psych : NO change in meds .  Hypertension History:      She denies headache, chest pain, palpitations, dyspnea with exertion, orthopnea, PND, peripheral edema, visual symptoms, neurologic problems, syncope, and side effects from treatment.  She notes no problems with any antihypertensive medication side effects.        Positive major cardiovascular risk factors include female age 31 years old or older, hyperlipidemia, and hypertension.  Negative major cardiovascular risk factors include non-tobacco-user status.        Further assessment for target organ damage reveals no history of ASHD.     Preventive Screening-Counseling & Management  Alcohol-Tobacco     Alcohol drinks/day: 0     Smoking Status:  never  Caffeine-Diet-Exercise     Caffeine use/day: 2     Does Patient Exercise: no  Current Medications (verified): 1)  Crestor 10 Mg Tabs (Rosuvastatin Calcium) .[name].. 1 Tablet Every Night 2)  Lamotrigine 200 Mg  Tabs (Lamotrigine) .[name].. 1 By Mouth Once Daily 3)  Nexium 40 Mg Cpdr (Esomeprazole Magnesium) .... Take 1 Capsule By Mouth Once A Day 4)  Seroquel 100 Mg Tabs (Quetiapine Fumarate) .[name].. 1 1/2 Tabs Once Daily 5)  Budeprion Sr 150 Mg  Tb12 (Bupropion Hcl) .... 2  By Mouth Once Daily 6)  Micardis Hct 80-25 Mg  Tabs (Telmisartan-Hctz) .[name].. 1 By Mouth Once Daily 7)  Ibuprofen 800 Mg  Tabs (Ibuprofen) .[name].. 1 By Mouth Two Times A Day 8)  Tramadol Hcl 50 Mg  Tabs (Tramadol Hcl) .[name].. 1 By Mouth Qid X Per Day  As Needed  Forpain 9)  Klor-Con M20 20 Meq  Tbcr (Potassium Chloride Crys Cr) .... 2 By Mouth Once Daily 10)  Geodon 20 Mg Caps (Ziprasidone Hcl) .[name].. 1 By Mouth Qam and 2 Hs 11)  Lexapro 10 Mg Tabs (Escitalopram Oxalate) .[name].. 1 By Mouth At Bedtime 12)  Vitamin D (Ergocalciferol) 50000 Unit Caps (Ergocalciferol) .[name].. 1 By Mouth Weekly 13)  Percocet 5-325 Mg Tabs (Oxycodone-Acetaminophen) 14)  Ferrous Sulfate 325 (65 Fe) Mg  Tabs (Ferrous Sulfate) 15)  Ambien 5 Mg Tabs (Zolpidem Tartrate) .[name].. 1 By Mouth At Bedtime 16)  Colace 100 Mg Caps (Docusate Sodium)  Allergies (verified): 1)  Aspirin (Aspirin)  Past History:  Past medical, surgical, family and social histories (including risk factors) reviewed, and no changes noted (except as noted below).  Past Medical History: Bil knee DJD, Obsessive d/o pf biting finger/toe nails, tx for h.pylori  4/05 Depression, sleep Hyperlipidemia Hypertension hyperglycemia, fasting  Colonic polyps, hx of,  04/05,   also  July 2011  G5P3  DJD back knees minimal spondyloesthesis L4-5     Consults Dr. Allyne Gee- Psych Dr. Michael Boston Someone for her memory that Dr. Sandria Manly sent her to.  Past Surgical History: Reviewed history from 03/20/2010  and no changes required. Bil carpal tunnel release 1998 -, bil knee arthroscopy 6/00 -, endometrial bx 1/03 - benign proliferative - 09/17/2001, Tubal ligation 1980`s -, [place] - fibroids sl. Larger - 01/09/2003  G5P3  Knee surgery  left  2011 tkr  Past History:  Care Management: Psychologist: [facility]: Merlene Morse  Family History: Reviewed history from 10/11/2009 and no changes required. father-unknown past medical history, mom deceased lung CA, DM2, HTN, sisters:heart murmur, one with colon cancer  HT   COLON cancer  Aunt alzheiners.   ? no parkinsons    Social History: Reviewed history from 03/20/2010 and no changes required. 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  helping out   Review of Systems       The patient complains of prolonged cough.  The patient denies anorexia, fever, weight loss, weight gain, vision loss, chest pain, syncope, dyspnea on exertion, hemoptysis, melena, hematochezia, transient blindness, abnormal bleeding, enlarged lymph nodes, and angioedema.         right knee arthritis problematic   Physical Exam  General:  alert, well-developed, and well-nourished.   Head:  normocephalic and atraumatic.   Eyes:  vision grossly intact.   glasses Nose:  no external deformity, no external erythema, and no nasal discharge.   Neck:  No deformities, masses, or tenderness noted. Lungs:  Normal respiratory effort, chest expands symmetrically. Lungs are clear to auscultation, no crackles or wheezes. Heart:  Normal rate and regular rhythm. S1 and S2 normal without gallop, murmur, click, rub or other extra sounds. see bp readings  Msk:  walks with walker  no acute redness or swelling  Pulses:  pulses intact without delay   Extremities:  no clubbing cyanosis or edema   Neurologic:  non focal grossly  Skin:  turgor normal, color normal, no ecchymoses, and no petechiae.   Cervical Nodes:  No lymphadenopathy noted Psych:  Oriented X3, normally interactive, good eye contact, not anxious appearing, and not depressed appearing.     Impression & Recommendations:  Problem # 1:  COUGH (ICD-786.2) ongoing  poss reflux related    no cough during appt time and nl exam  poss  reflux related ?   no asthma ar signs  Orders: Gastroenterology Referral (GI) T-2 View CXR (71020TC)  Problem # 2:  OTHER DYSPHAGIA (ICD-787.29) sounds esophageal to water and solids    on nexium and no pill esophagitis by hx and no choking   will inc to two times a day nexium and get dr Juanda Chance to see.  Orders: Gastroenterology Referral (GI) Prescription Created Electronically [phone])  Problem # 3:  HYPERTENSION (ICD-401.9) very good today  Her updated medication list for this problem includes:    Micardis Hct 80-25 Mg Tabs (Telmisartan-hctz) .[name]... 1 by mouth once daily  Orders: TLB-BMP (Basic Metabolic Panel-BMET) (80048-METABOL)  BP today: 140/70 Prior BP: 132/80 (03/20/2010)  10 Yr Risk Heart Disease: 6 % Prior 10 Yr Risk Heart Disease: 4 % (01/09/2010)  Labs Reviewed: K+: 4.3 (10/04/2009) Creat: : 1.2 (10/04/2009)   Chol: 126 (10/04/2009)   HDL: 78.30 (10/04/2009)   LDL: 41 (10/04/2009)   TG: 34.0 (10/04/2009)  Problem # 4:  GERD (ICD-530.81)  Her updated medication list for this problem includes:    Nexium 40 Mg Cpdr (Esomeprazole magnesium) .[name]... Take 1 capsule by mouth two times a day  Problem # 5:  DEGENERATIVE JOINT DISEASE (ICD-715.90)  right knee problematic.  Her updated medication list for this problem includes:    Ibuprofen 800 Mg Tabs (Ibuprofen) .[name]... 1 by mouth two times a day    Tramadol Hcl 50 Mg Tabs (Tramadol hcl) .[name]... 1 by mouth qid x per day as needed  forpain    Percocet 5-325 Mg Tabs (Oxycodone-acetaminophen)  Problem # 6:  ANEMIA, MILD  (ICD-285.9)  iron def in the apst  recheck today  Her updated medication list for this problem includes:    Ferrous Sulfate 325 (65 Fe) Mg Tabs (Ferrous sulfate)  Orders: TLB-CBC Platelet - w/Differential (85025-CBCD) TLB-IBC Pnl (Iron/FE;Transferrin) (83550-IBC) TLB-Ferritin (82728-FER)  Hgb: 11.3 (01/09/2010)   Hct: 34.2 (10/04/2009)   Platelets: 227.0 (10/04/2009) RBC: 3.46 (10/04/2009)   RDW: 13.4 (10/04/2009)   WBC: 3.4 (10/04/2009) MCV: 98.9 (10/04/2009)   MCHC: 32.6 (10/04/2009) Iron: 57 (07/19/2009)   % Sat: 19.6 (07/19/2009) B12: 414 (07/19/2009)   TSH: 3.56 (10/04/2009)  Complete Medication List: 1)  Crestor 10 Mg Tabs (Rosuvastatin calcium) .[name].. 1 tablet every night 2)  Lamotrigine 200 Mg Tabs (Lamotrigine) .[name].. 1 by mouth once daily 3)  Nexium 40 Mg Cpdr (Esomeprazole magnesium) .... Take 1 capsule by mouth two times a day 4)  Seroquel 100 Mg Tabs (Quetiapine fumarate) .[name].. 1 1/2 tabs once daily 5)  Budeprion Sr 150 Mg Tb12 (Bupropion hcl) .... 2  by mouth once daily 6)  Micardis Hct 80-25 Mg Tabs (Telmisartan-hctz) .[name].. 1 by mouth once daily 7)  Ibuprofen 800 Mg Tabs (Ibuprofen) .[name].. 1 by mouth two times a day 8)  Tramadol Hcl 50 Mg Tabs (Tramadol hcl) .[name].. 1 by mouth qid x per day as needed  forpain 9)  Klor-con M20 20 Meq Tbcr (Potassium chloride crys cr) .... 2 by mouth once daily 10)  Geodon 20 Mg Caps (Ziprasidone hcl) .[name].. 1 by mouth qam and 2 hs 11)  Lexapro 10 Mg Tabs (Escitalopram oxalate) .[name].. 1 by mouth at bedtime 12)  Vitamin D (ergocalciferol) 50000 Unit Caps (Ergocalciferol) .[name].. 1 by mouth weekly 13)  Percocet 5-325 Mg Tabs (Oxycodone-acetaminophen) 14)  Ferrous Sulfate 325 (65 Fe) Mg Tabs (Ferrous sulfate) 15)  Ambien 5 Mg Tabs (Zolpidem tartrate) .[name].. 1 by mouth at bedtime 16)  Colace 100 Mg Caps (Docusate sodium)  Hypertension Assessment/Plan:      The patient's hypertensive risk group is category B: At least one risk factor (excluding diabetes) with  no target organ damage.  Her calculated 10 year risk of coronary heart disease is 6 %.  Today's blood pressure is 140/70.  Her blood pressure goal is < 140/90.  Patient Instructions: 1)  get chest x ray . 2)  will get Dr Juanda Chance to see  you for possible esophagus cause of your signs . 3)  In the mean time can try nexium two times a day for a week or so.  4)  No change in Bp med your readings are good today . 5)  You will be informed of lab results when available.  6)  return office visit depending on the results . Prescriptions: NEXIUM 40 MG CPDR (ESOMEPRAZOLE MAGNESIUM) Take 1 capsule by mouth two times a day  #60 x 1   Entered and Authorized by:   Madelin Headings MD   Signed by:   Madelin Headings MD on 04/10/2010   Method used:   Electronically to        [facility] [address] [phone]* (retail)       [address]       [place], [place]  [address]       Ph: [phone] or [phone]       Fax: [phone]   RxID:   [id number]

## 2010-10-10 NOTE — Assessment & Plan Note (Signed)
Summary: 3 MONTH ROV/NJR   Vital Signs:  Patient profile:   60 year old female Menstrual status:  postmenopausal Weight:      259 pounds Pulse rate:   72 / minute BP sitting:   120 / 80  (left arm) Cuff size:   large  Vitals Entered By: Romualdo Bolk, CMA (AAMA) (Jan 09, 2010 8:26 AM) CC: follow-up visit, Hypertension Management   History of Present Illness: [name] comes  in comes in today  for  follow up of her anemia. Since last visit no bleeding or change in status but  hasnt been taking iron as disc.     To have  Dr Earlene Plater . do surgery   for   left knee  TKR.  HT no change  .    and controlled  .   NO cv pulm signs  LIPiDS:No change  Psych NO change  GI NO bleeing no change   Hypertension History:      She complains of peripheral edema, but denies headache, chest pain, palpitations, dyspnea with exertion, orthopnea, PND, visual symptoms, neurologic problems, syncope, and side effects from treatment.  She notes no problems with any antihypertensive medication side effects.  Ankles swelling.        Positive major cardiovascular risk factors include female age 70 years old or older, hyperlipidemia, and hypertension.  Negative major cardiovascular risk factors include non-tobacco-user status.        Further assessment for target organ damage reveals no history of ASHD.     Preventive Screening-Counseling & Management  Alcohol-Tobacco     Alcohol drinks/day: 0     Smoking Status: never  Caffeine-Diet-Exercise     Caffeine use/day: 2     Does Patient Exercise: no  Current Medications (verified): 1)  Crestor 10 Mg Tabs (Rosuvastatin Calcium) .[name].. 1 Tablet Every Night 2)  Lamotrigine 200 Mg  Tabs (Lamotrigine) .[name].. 1 By Mouth Once Daily Dr. Nolen Mu 3)  Nexium 40 Mg Cpdr (Esomeprazole Magnesium) .... Take 1 Capsule By Mouth Once A Day 4)  Seroquel 200 Mg Tabs (Quetiapine Fumarate) .[name].. 1 Qam and 2 Qhs 5)  Budeprion Sr 150 Mg  Tb12 (Bupropion Hcl) .... 2  By Mouth  Once Daily Dr. Nolen Mu 6)  Micardis Hct 80-25 Mg  Tabs (Telmisartan-Hctz) .[name].. 1 By Mouth Once Daily 7)  Ibuprofen 800 Mg  Tabs (Ibuprofen) .[name].. 1 By Mouth Two Times A Day 8)  Tramadol Hcl 50 Mg  Tabs (Tramadol Hcl) .[name].. 1 By Mouth Qid X Per Day As Needed  Forpain 9)  Klor-Con M20 20 Meq  Tbcr (Potassium Chloride Crys Cr) .... 2 By Mouth Once Daily 10)  Geodon 20 Mg Caps (Ziprasidone Hcl) .[name].. 1 By Mouth Qam and 2 Hs 11)  Lexapro 10 Mg Tabs (Escitalopram Oxalate) .[name].. 1 By Mouth At Bedtime 12)  Vitamin D (Ergocalciferol) 50000 Unit Caps (Ergocalciferol) .[name].. 1 By Mouth Weekly  Allergies (verified): 1)  Aspirin (Aspirin)  Past History:  Past medical, surgical, family and social histories (including risk factors) reviewed, and no changes noted (except as noted below).  Past Medical History: Reviewed history from 10/11/2009 and no changes required. 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 Allyne Gee- Psych Dr. Michael Boston Someone for her memory that Dr. Sandria Manly sent her to.  Past Surgical History: Reviewed history from 05/21/2008 and no changes required. Bil carpal tunnel release 1998 -, bil knee arthroscopy 6/00 -, endometrial bx 1/03 - benign proliferative - 09/17/2001, Tubal ligation 1980`s -, [place] - fibroids sl. Larger - 01/09/2003  G5P3   Past History:  Care Management: Psychologist: [facility]   Orthopedics: Earlene Plater  Family History: Reviewed history from 10/11/2009 and no changes required. father-unknown past medical history, mom deceased lung CA, DM2, HTN, sisters:heart murmur, one with colon cancer  HT   COLON cancer  Aunt alzheiners.   ? no parkinsons    Social History: Reviewed history from 10/11/2009 and no changes required. 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    Physical Exam  General:  alert, well-developed, well-nourished, and well-hydrated.  slow antalgic gait Head:  normocephalic and atraumatic.   Eyes:  vision grossly intact, pupils equal, pupils round, and pupils reactive to light.   Neck:  No deformities, masses, or tenderness noted. Lungs:  normal respiratory effort, no intercostal retractions, no accessory muscle use, normal breath sounds, and no dullness.   Heart:  normal rate, regular rhythm, and no JVD.   Pulses:  pulses intact without delay   Neurologic:  no tremor  oriented  Skin:  turgor normal and color normal.   Cervical Nodes:  No lymphadenopathy noted Psych:  Oriented X3, good eye contact, not anxious appearing, and not depressed appearing.     Impression & Recommendations:  Problem # 1:  ANEMIA, MILD (ICD-285.9)  low iron sat on last labs   didnt take iron  forgot .   is to get preop for her knee soon Orders: Hgb (85018) Fingerstick (36416)  Hgb: 11.3 (01/09/2010)   Hct: 34.2 (10/04/2009)   Platelets: 227.0 (10/04/2009) RBC: 3.46 (10/04/2009)   RDW: 13.4 (10/04/2009)   WBC: 3.4 (10/04/2009) MCV: 98.9 (10/04/2009)   MCHC: 32.6 (10/04/2009) Iron: 57 (07/19/2009)   % Sat: 19.6 (07/19/2009) B12: 414 (07/19/2009)   TSH: 3.56 (10/04/2009)  Problem # 2:  HYPERTENSION (ICD-401.9)  controlled  Her updated medication list for this problem includes:    Micardis Hct 80-25 Mg Tabs (Telmisartan-hctz) .[name]... 1 by mouth once daily  BP today: 120/80 Prior BP: 120/90 (10/11/2009)  10 Yr Risk Heart Disease: 4  % Prior 10 Yr Risk Heart Disease: 6 % (05/27/2009)  Labs Reviewed: K+: 4.3 (10/04/2009) Creat: : 1.2 (10/04/2009)   Chol: 126 (10/04/2009)   HDL: 78.30 (10/04/2009)   LDL: 41 (10/04/2009)   TG: 34.0 (10/04/2009)  Problem # 3:  DEGENERATIVE JOINT DISEASE (ICD-715.90) knee and to have   TKR     left  Dr Earlene Plater .   note done and given to patient.  Her updated medication list for this problem includes:    Ibuprofen 800 Mg Tabs (Ibuprofen) .[name]... 1 by mouth two times a day    Tramadol Hcl 50 Mg Tabs (Tramadol hcl) .[name]... 1 by mouth qid x per day as needed  forpain  Problem # 4:  HYPERGLYCEMIA, FASTING (ICD-790.29) Assessment: Comment Only  Problem # 5:  DEPRESSION (ICD-311) Assessment: Unchanged stable  Her updated medication list  for this problem includes:    Budeprion Sr 150 Mg Tb12 (Bupropion hcl) .[name]... 2  by mouth once daily dr. Nolen Mu    Lexapro 10 Mg Tabs (Escitalopram oxalate) .[name]... 1 by mouth at bedtime  Complete Medication List: 1)  Crestor 10 Mg Tabs (Rosuvastatin calcium) .[name].. 1 tablet every night 2)  Lamotrigine 200 Mg Tabs (Lamotrigine) .[name].. 1 by mouth once daily dr. Nolen Mu 3)  Nexium 40 Mg Cpdr (Esomeprazole magnesium) .... Take 1 capsule by mouth once a day 4)  Seroquel 200 Mg Tabs (Quetiapine fumarate) .[name].. 1 qam and 2 qhs 5)  Budeprion Sr 150 Mg Tb12 (Bupropion hcl) .... 2  by mouth once daily dr. Nolen Mu 6)  Micardis Hct 80-25 Mg Tabs (Telmisartan-hctz) .[name].. 1 by mouth once daily 7)  Ibuprofen 800 Mg Tabs (Ibuprofen) .[name].. 1 by mouth two times a day 8)  Tramadol Hcl 50 Mg Tabs (Tramadol hcl) .[name].. 1 by mouth qid x per day as needed  forpain 9)  Klor-con M20 20 Meq Tbcr (Potassium chloride crys cr) .... 2 by mouth once daily 10)  Geodon 20 Mg Caps (Ziprasidone hcl) .[name].. 1 by mouth qam and 2 hs 11)  Lexapro 10 Mg Tabs (Escitalopram oxalate) .[name].. 1 by mouth at bedtime 12)  Vitamin D (ergocalciferol) 50000 Unit Caps (Ergocalciferol) .[name].. 1 by mouth weekly  Hypertension  Assessment/Plan:      The patient's hypertensive risk group is category B: At least one risk factor (excluding diabetes) with no target organ damage.  Her calculated 10 year risk of coronary heart disease is 4 %.  Today's blood pressure is 120/80.  Her blood pressure goal is < 140/90.  Patient Instructions: 1)  your HG today was 11.3  slightly low.  take iron pill every day  . 2)  return office visit in  3 months  or as needed. 3)  The surgeon will also check your  hemoglobin around the time of  surgery.  should get colonscopy   also  after above     Laboratory Results   Blood Tests   Date/Time Recieved: Jan 09, 2010 8:55 AM  Date/Time Reported: Jan 09, 2010 8:54 AM    CBC HGB:  11.3 g/dL   (Normal Range: 16.1-09.6 in Males, 12.0-15.0 in Females) Comments: Wynona Canes, CMA  Jan 09, 2010 8:55 AM

## 2010-10-10 NOTE — Letter (Signed)
Summary: Previsit letter  [facility]  [address] [place], [place] [address]   Phone: [phone]  Fax: [phone]       02/09/2010 MRN: [id number]  [facility] [address] [place], [place]  [address]  Dear [name],  Welcome to the Gastroenterology Division at [facility].    You are scheduled to see a nurse for your pre-procedure visit on 02-17-10 at 1:30pm on the 3rd floor at [facility], [address]  We ask that you try to arrive at our office 15 minutes prior to your appointment time to allow for check-in.  Your nurse visit will consist of discussing your medical and surgical history, your immediate family medical history, and your medications.    Please bring a complete list of all your medications or, if you prefer, bring the medication bottles and we will list them.  We will need to be aware of both prescribed and over the counter drugs.  We will need to know exact dosage information as well.  If you are on blood thinners (Coumadin, Plavix, Aggrenox, Ticlid, etc.) please call our office today/prior to your appointment, as we need to consult with your physician about holding your medication.   Please be prepared to read and sign documents such as consent forms, a financial agreement, and acknowledgement forms.  If necessary, and with your consent, a friend or relative is welcome to sit-in on the nurse visit with you.  Please bring your insurance card so that we may make a copy of it.  If your insurance requires a referral to see a specialist, please bring your referral form from your primary care physician.  No co-pay is required for this nurse visit.     If you cannot keep your appointment, please call [phone] to cancel or reschedule prior to your appointment date.  This allows [place] the opportunity to schedule an appointment for another patient in need of care.    Thank you for choosing [facility] for your  medical needs.  We appreciate the opportunity to care for you.  Please visit [place] at our website  to learn more about our practice.                     Sincerely.                                                                                                                   The Gastroenterology Division

## 2010-10-10 NOTE — Letter (Signed)
Summary: [facility]  [facility]   Imported By: Maryln Gottron 02/15/2010 13:57:22  _____________________________________________________________________  External Attachment:    Type:   Image     Comment:   External Document
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
## 2010-10-12 NOTE — Assessment & Plan Note (Signed)
Summary: 1 month follow up/cb   Vital Signs:  Patient profile:   59 year old female Menstrual status:  postmenopausal Weight:      252 pounds Temp:     99.1 degrees F oral Pulse rate:   72 / minute BP sitting:   140 / 90  (left arm) Cuff size:   large  Vitals Entered By: Romualdo Bolk, CMA (AAMA) (August 22, 2010 8:24 AM)  Serial Vital Signs/Assessments:  Time      Position  BP       Pulse  Resp  Temp     By                     120/82                         Madelin Headings MD  Comments: right arm sitting  By: Madelin Headings MD   CC: Follow-up visit on anemia and bp, Hypertension Management   History of Present Illness: [name]  comes in today  for follow up of anemia post op TKR.  ging now to PT as OP and progressing.  now able to walk without assistance and drive and feeling better, has some ant knee pain at times.   some le swelling  puts feet up at times   getting better.   HT controlled no med se  Pulm No change  Pain: taking tramadol usually qid   needs refill. no more percocet. Depression: stable   no change meds . Dysphagia gerd : no change   doing wee  no se of the iron pills.   Hypertension History:      She denies headache, chest pain, palpitations, dyspnea with exertion, orthopnea, PND, peripheral edema, visual symptoms, neurologic problems, syncope, and side effects from treatment.  She notes no problems with any antihypertensive medication side effects.        Positive major cardiovascular risk factors include female age 70 years old or older, hyperlipidemia, and hypertension.  Negative major cardiovascular risk factors include non-tobacco-user status.        Further assessment for target organ damage reveals no history of ASHD.     Preventive Screening-Counseling & Management  Alcohol-Tobacco     Alcohol drinks/day: 0     Smoking Status: never  Caffeine-Diet-Exercise     Caffeine use/day: 2     Does Patient Exercise: no  Current  Medications (verified): 1)  Crestor 10 Mg Tabs (Rosuvastatin Calcium) .[name].. 1 Tablet Every Night 2)  Lamotrigine 200 Mg  Tabs (Lamotrigine) .[name].. 1 By Mouth Once Daily 3)  Seroquel 100 Mg Tabs (Quetiapine Fumarate) .[name].. 1 1/2 Tabs Once Daily 4)  Budeprion Xl 150 Mg Xr24h-Tab (Bupropion Hcl) .[name].. 1 By Mouth Once Daily 5)  Micardis Hct 80-25 Mg  Tabs (Telmisartan-Hctz) .[name].. 1 By Mouth Once Daily 6)  Ibuprofen 800 Mg  Tabs (Ibuprofen) .[name].. 1 By Mouth Two Times A Day 7)  Tramadol Hcl 50 Mg  Tabs (Tramadol Hcl) .[name].. 1 By Mouth Qid X Per Day As Needed  Forpain 8)  Klor-Con M20 20 Meq  Tbcr (Potassium Chloride Crys Cr) .... 2 By Mouth Once Daily 9)  Geodon 20 Mg Caps (Ziprasidone Hcl) .[name].. 1 By Mouth Qam and 2 Hs 10)  Lexapro 10 Mg Tabs (Escitalopram Oxalate) .[name].. 1 By Mouth At Bedtime 11)  Vitamin D (Ergocalciferol) 50000 Unit Caps (Ergocalciferol) .[name].. 1 By Mouth Weekly 12)  Ferrous Sulfate 325 (65 Fe) Mg  Tabs (Ferrous Sulfate) 13)  Colace 100 Mg Caps (Docusate Sodium) 14)  Nexium 40 Mg Cpdr (Esomeprazole Magnesium) .... Take 1 Tab 30 Min Prior To Breakfast  Allergies (verified): 1)  Aspirin (Aspirin)  Past History:  Past medical, surgical, family and social histories (including risk factors) reviewed for relevance to current acute and chronic problems.  Past Medical History: Reviewed history from 04/13/2010 and no changes required. 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 Allyne Gee- Psych Dr. Michael Boston Someone for her memory that Dr. Sandria Manly sent her to.  Past Surgical History: Reviewed history from 07/19/2010 and no changes required. Bil carpal tunnel release 1998  bil knee arthroscopy 6/00  endometrial bx 1/03 - benign proliferative  - 09/17/2001 Tubal ligation 1980`s   [place] - fibroids sl. Larger - 01/09/2003 G5P3  Knee surgery  left  2011 tkr Rt knee replacement 2011  Past History:  Care Management: Psychologist: [facility]: Merlene Morse  Family History: Reviewed history from 04/13/2010 and no changes required. father-unknown past medical history, mom deceased lung CA,HTN, sisters:heart murmur, one with colon cancer  HT  Aunt alzheiners.   ? no parkinsons   Family History of Colon Cancer: Sister Family History of Diabetes: Great Grandmother  Social History: Reviewed history from 04/13/2010 and no changes required. 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  helping out  Patient has never smoked. -2 cups daily Alcohol Use - no Daily Caffeine Use Illicit Drug Use - no Patient does not get regular exercise.   Review of Systems  The patient denies anorexia, fever, weight loss, weight gain, vision loss, decreased hearing, chest pain, syncope, prolonged cough, melena, hematochezia, severe indigestion/heartburn, muscle weakness, transient blindness, abnormal bleeding, enlarged lymph nodes, and angioedema.         see evaluation for dyshpagia   on nexium   Physical Exam  General:  Well-developed,well-nourished,in no acute distress; alert,appropriate and cooperative throughout examination Head:  normocephalic and atraumatic.   Eyes:  vision grossly intact.  glasses  Neck:  No deformities, masses, or tenderness noted. Lungs:  normal respiratory effort, no intercostal retractions, and no accessory muscle use.   Heart:  normal rate, regular rhythm, no murmur, no gallop, and no JVD.   Msk:  gait independent and minor antalgia    no warmth or redness  Pulses:  pulses intact without delay   Extremities:  no clubbing cyanosis  1+ edema     Neurologic:  no change alert & oriented X3.   non focal  no tremor Skin:  turgor normal, color normal, no ecchymoses, and no petechiae.   Cervical Nodes:  No lymphadenopathy noted Psych:  brighter affect    looks wellOriented X3, memory intact for recent and remote, normally interactive, and good eye contact.     Impression & Recommendations:  Problem # 1:  ANEMIA (ICD-285.9) Assessment Improved  Her updated medication list for this problem includes:    Ferrous Sulfate 325 (65 Fe) Mg Tabs (Ferrous sulfate)  Orders: Hgb (85018) Fingerstick (14782)  Hgb: 12.6 (08/22/2010)   Hct: 34.9 (06/12/2010)  Platelets: 276.0 (06/12/2010) RBC: 3.59 (06/12/2010)   RDW: 13.3 (06/12/2010)   WBC: 4.8 (06/12/2010) MCV: 97.2 (06/12/2010)   MCHC: 33.5 (06/12/2010) Ferritin: 86.7 (04/10/2010) Iron: 64 (04/10/2010)   % Sat: 25.9 (04/10/2010) B12: 414 (07/19/2009)   TSH: 3.56 (10/04/2009)  Problem # 2:  TOTAL KNEE REPLACEMENT, RIGHT, HX OF (ICD-V43.65) in pt convalescing   Problem # 3:  HYPERTENSION (ICD-401.9) continue   monitor  meds   better reading after sitting. Her updated medication list for this problem includes:    Micardis Hct 80-25 Mg Tabs (Telmisartan-hctz) .[name]... 1 by mouth once daily  Problem # 4:  DEGENERATIVE JOINT DISEASE (ICD-715.90) no using percocet any more  dis caution with tramadol .Marland KitchenLenox Ponds aaware The following medications were removed from the medication list:    Percocet 5-325 Mg Tabs (Oxycodone-acetaminophen) Her updated medication list for this problem includes:    Ibuprofen 800 Mg Tabs (Ibuprofen) .[name]... 1 by mouth two times a day    Tramadol Hcl 50 Mg Tabs (Tramadol hcl) .[name]... 1 by mouth qid x per day as needed  forpain  Problem # 5:  DEPRESSION (ICD-311) looks improved today   and more animated .  Her updated medication list for this problem includes:    Budeprion Xl 150 Mg Xr24h-tab (Bupropion hcl) .[name]... 1 by mouth once daily    Lexapro 10 Mg Tabs  (Escitalopram oxalate) .[name]... 1 by mouth at bedtime  Problem # 6:  OBESITY (ICD-278.00)  Ht: 65.5 (04/13/2010)   Wt: 252 (08/22/2010)   BMI: 41.94 (04/13/2010)  Problem # 7:  RENAL INSUFFICIENCY  CR 1.5 (ICD-588.9)  Complete Medication List: 1)  Crestor 10 Mg Tabs (Rosuvastatin calcium) .[name].. 1 tablet every night 2)  Lamotrigine 200 Mg Tabs (Lamotrigine) .[name].. 1 by mouth once daily 3)  Seroquel 100 Mg Tabs (Quetiapine fumarate) .[name].. 1 1/2 tabs once daily 4)  Budeprion Xl 150 Mg Xr24h-tab (Bupropion hcl) .[name].. 1 by mouth once daily 5)  Micardis Hct 80-25 Mg Tabs (Telmisartan-hctz) .[name].. 1 by mouth once daily 6)  Ibuprofen 800 Mg Tabs (Ibuprofen) .[name].. 1 by mouth two times a day 7)  Tramadol Hcl 50 Mg Tabs (Tramadol hcl) .[name].. 1 by mouth qid x per day as needed  forpain 8)  Klor-con M20 20 Meq Tbcr (Potassium chloride crys cr) .... 2 by mouth once daily 9)  Geodon 20 Mg Caps (Ziprasidone hcl) .[name].. 1 by mouth qam and 2 hs 10)  Lexapro 10 Mg Tabs (Escitalopram oxalate) .[name].. 1 by mouth at bedtime 11)  Vitamin D (ergocalciferol) 50000 Unit Caps (Ergocalciferol) .[name].. 1 by mouth weekly 12)  Ferrous Sulfate 325 (65 Fe) Mg Tabs (Ferrous sulfate) 13)  Colace 100 Mg Caps (Docusate sodium) 14)  Nexium 40 Mg Cpdr (Esomeprazole magnesium) .... Take 1 tab 30 min prior to breakfast  Hypertension Assessment/Plan:      The patient's hypertensive risk group is category B: At least one risk factor (excluding diabetes) with no target organ damage.  Her calculated 10 year risk of coronary heart disease is 6 %.  Today's blood pressure is 140/90.  Her blood pressure goal is < 140/90.  Patient Instructions: 1)  your hg is good today. 2)  rec keep taking iron for another month and then can stop  3)  schedule medicare annual wellness visit   in 4 months . 4)  before visit  can get fasting labs  as below.  5)  LAbs LIPIDS,LFTS, CBCdiff  IBC, BMP TSH  HGa1c 6)  dx 401.9, 272.4, hyperglycemia , anemia ,  7)  NO change  in meds    8)  call as needed in the meantime Prescriptions: TRAMADOL HCL 50 MG  TABS (TRAMADOL HCL) 1 by mouth qid x per day as needed  forpain  #120 x 3   Entered and Authorized by:   Madelin Headings MD   Signed by:   Madelin Headings MD on 08/22/2010   Method used:   Print then Give to Patient   RxID:   [id number]    Orders Added: 1)  Hgb [85018] 2)  Fingerstick [36416] 3)  Est. Patient Level IV [99214]    Laboratory Results   CBC   HGB:  12.6 g/dL   (Normal Range: 14.7-82.9 in Males, 12.0-15.0 in Females) Comments: Rita Ohara  August 22, 2010 8:57 AM
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
## 2010-10-18 NOTE — Letter (Signed)
Summary: [facility]  [facility]   Imported By: Maryln Gottron 10/12/2010 13:55:03  _____________________________________________________________________  External Attachment:    Type:   Image     Comment:   External Document

## 2010-11-22 LAB — PROTIME-INR
INR: 1.02 (ref 0.00–1.49)
INR: 1.53 — ABNORMAL HIGH (ref 0.00–1.49)
INR: 2.33 — ABNORMAL HIGH (ref 0.00–1.49)
INR: 2.59 — ABNORMAL HIGH (ref 0.00–1.49)
Prothrombin Time: 13.6 seconds (ref 11.6–15.2)
Prothrombin Time: 25.7 seconds — ABNORMAL HIGH (ref 11.6–15.2)
Prothrombin Time: 27.9 seconds — ABNORMAL HIGH (ref 11.6–15.2)

## 2010-11-22 LAB — CBC
HCT: 27.5 % — ABNORMAL LOW (ref 36.0–46.0)
Hemoglobin: 11.2 g/dL — ABNORMAL LOW (ref 12.0–15.0)
Hemoglobin: 8.5 g/dL — ABNORMAL LOW (ref 12.0–15.0)
MCH: 31.5 pg (ref 26.0–34.0)
MCHC: 33.1 g/dL (ref 30.0–36.0)
MCV: 97.8 fL (ref 78.0–100.0)
Platelets: 188 10*3/uL (ref 150–400)
Platelets: 203 10*3/uL (ref 150–400)
Platelets: 229 10*3/uL (ref 150–400)
RBC: 2.65 MIL/uL — ABNORMAL LOW (ref 3.87–5.11)
RBC: 2.71 MIL/uL — ABNORMAL LOW (ref 3.87–5.11)
RBC: 2.9 MIL/uL — ABNORMAL LOW (ref 3.87–5.11)
RBC: 3.56 MIL/uL — ABNORMAL LOW (ref 3.87–5.11)
RDW: 12.5 % (ref 11.5–15.5)
WBC: 7.6 10*3/uL (ref 4.0–10.5)
WBC: 7.8 10*3/uL (ref 4.0–10.5)
WBC: 9.4 10*3/uL (ref 4.0–10.5)

## 2010-11-22 LAB — BASIC METABOLIC PANEL
BUN: 26 mg/dL — ABNORMAL HIGH (ref 6–23)
CO2: 28 mEq/L (ref 19–32)
Calcium: 7.9 mg/dL — ABNORMAL LOW (ref 8.4–10.5)
Calcium: 8.4 mg/dL (ref 8.4–10.5)
Chloride: 101 mEq/L (ref 96–112)
Creatinine, Ser: 1.59 mg/dL — ABNORMAL HIGH (ref 0.4–1.2)
Creatinine, Ser: 2.54 mg/dL — ABNORMAL HIGH (ref 0.4–1.2)
Creatinine, Ser: 2.96 mg/dL — ABNORMAL HIGH (ref 0.4–1.2)
GFR calc Af Amer: 20 mL/min — ABNORMAL LOW (ref >60)
GFR calc Af Amer: 23 mL/min — ABNORMAL LOW (ref >60)
GFR calc Af Amer: 40 mL/min — ABNORMAL LOW (ref >60)
GFR calc Af Amer: 57 mL/min — ABNORMAL LOW (ref >60)
GFR calc non Af Amer: 16 mL/min — ABNORMAL LOW (ref >60)
GFR calc non Af Amer: 47 mL/min — ABNORMAL LOW (ref >60)
Glucose, Bld: 143 mg/dL — ABNORMAL HIGH (ref 70–99)
Potassium: 4.1 mEq/L (ref 3.5–5.1)
Sodium: 132 mEq/L — ABNORMAL LOW (ref 135–145)
Sodium: 138 mEq/L (ref 135–145)

## 2010-11-23 LAB — CBC
Hemoglobin: 12.2 g/dL (ref 12.0–15.0)
MCHC: 32.7 g/dL (ref 30.0–36.0)

## 2010-11-23 LAB — URINE MICROSCOPIC-ADD ON

## 2010-11-23 LAB — DIFFERENTIAL
Eosinophils Absolute: 0.1 10*3/uL (ref 0.0–0.7)
Eosinophils Relative: 1 % (ref 0–5)
Lymphs Abs: 1.8 10*3/uL (ref 0.7–4.0)
Monocytes Absolute: 0.4 10*3/uL (ref 0.1–1.0)
Monocytes Relative: 8 % (ref 3–12)

## 2010-11-23 LAB — COMPREHENSIVE METABOLIC PANEL
ALT: 21 U/L (ref 0–35)
AST: 22 U/L (ref 0–37)
CO2: 29 mEq/L (ref 19–32)
Calcium: 9.3 mg/dL (ref 8.4–10.5)
GFR calc Af Amer: 46 mL/min — ABNORMAL LOW (ref >60)
GFR calc non Af Amer: 38 mL/min — ABNORMAL LOW (ref >60)
Sodium: 139 mEq/L (ref 135–145)
Total Protein: 6.8 g/dL (ref 6.0–8.3)

## 2010-11-23 LAB — URINALYSIS, ROUTINE W REFLEX MICROSCOPIC
Nitrite: POSITIVE — AB
Specific Gravity, Urine: 1.017 (ref 1.005–1.030)
pH: 8 (ref 5.0–8.0)

## 2010-11-23 LAB — SURGICAL PCR SCREEN: MRSA, PCR: NEGATIVE

## 2010-11-27 LAB — BASIC METABOLIC PANEL
BUN: 18 mg/dL (ref 6–23)
Calcium: 8.5 mg/dL (ref 8.4–10.5)
Chloride: 104 mEq/L (ref 96–112)
GFR calc Af Amer: 59 mL/min — ABNORMAL LOW (ref >60)
GFR calc Af Amer: >60 mL/min (ref >60)
GFR calc non Af Amer: 48 mL/min — ABNORMAL LOW (ref >60)
GFR calc non Af Amer: 53 mL/min — ABNORMAL LOW (ref >60)
Glucose, Bld: 124 mg/dL — ABNORMAL HIGH (ref 70–99)
Glucose, Bld: 137 mg/dL — ABNORMAL HIGH (ref 70–99)
Potassium: 3.8 mEq/L (ref 3.5–5.1)
Potassium: 3.9 mEq/L (ref 3.5–5.1)
Potassium: 4.2 mEq/L (ref 3.5–5.1)
Sodium: 134 mEq/L — ABNORMAL LOW (ref 135–145)
Sodium: 135 mEq/L (ref 135–145)
Sodium: 138 mEq/L (ref 135–145)

## 2010-11-27 LAB — PROTIME-INR
INR: 1.76 — ABNORMAL HIGH (ref 0.00–1.49)
INR: 2.44 — ABNORMAL HIGH (ref 0.00–1.49)
Prothrombin Time: 20.4 seconds — ABNORMAL HIGH (ref 11.6–15.2)

## 2010-11-27 LAB — CBC
HCT: 23.7 % — ABNORMAL LOW (ref 36.0–46.0)
HCT: 28 % — ABNORMAL LOW (ref 36.0–46.0)
HCT: 29.1 % — ABNORMAL LOW (ref 36.0–46.0)
Hemoglobin: 8 g/dL — ABNORMAL LOW (ref 12.0–15.0)
Hemoglobin: 9.3 g/dL — ABNORMAL LOW (ref 12.0–15.0)
Hemoglobin: 9.6 g/dL — ABNORMAL LOW (ref 12.0–15.0)
MCV: 97.4 fL (ref 78.0–100.0)
Platelets: 195 10*3/uL (ref 150–400)
RBC: 2.44 MIL/uL — ABNORMAL LOW (ref 3.87–5.11)
RDW: 13.4 % (ref 11.5–15.5)
RDW: 13.6 % (ref 11.5–15.5)
WBC: 8.3 10*3/uL (ref 4.0–10.5)
WBC: 8.6 10*3/uL (ref 4.0–10.5)
WBC: 9.4 10*3/uL (ref 4.0–10.5)

## 2010-11-27 LAB — GLUCOSE, CAPILLARY

## 2010-11-27 LAB — PREPARE RBC (CROSSMATCH)

## 2010-11-28 LAB — PROTIME-INR
Prothrombin Time: 12.1 seconds (ref 11.6–15.2)
Prothrombin Time: 13.2 seconds (ref 11.6–15.2)

## 2010-11-28 LAB — CBC
HCT: 36.1 % (ref 36.0–46.0)
Hemoglobin: 10.6 g/dL — ABNORMAL LOW (ref 12.0–15.0)
MCV: 98 fL (ref 78.0–100.0)
MCV: 98.5 fL (ref 78.0–100.0)
Platelets: 262 10*3/uL (ref 150–400)
RBC: 3.21 MIL/uL — ABNORMAL LOW (ref 3.87–5.11)
RBC: 3.67 MIL/uL — ABNORMAL LOW (ref 3.87–5.11)
WBC: 5.4 10*3/uL (ref 4.0–10.5)
WBC: 9.1 10*3/uL (ref 4.0–10.5)

## 2010-11-28 LAB — COMPREHENSIVE METABOLIC PANEL
AST: 20 U/L (ref 0–37)
Albumin: 4.1 g/dL (ref 3.5–5.2)
Alkaline Phosphatase: 147 U/L — ABNORMAL HIGH (ref 39–117)
BUN: 25 mg/dL — ABNORMAL HIGH (ref 6–23)
CO2: 27 mEq/L (ref 19–32)
Chloride: 108 mEq/L (ref 96–112)
Creatinine, Ser: 1.28 mg/dL — ABNORMAL HIGH (ref 0.4–1.2)
GFR calc Af Amer: 52 mL/min — ABNORMAL LOW (ref >60)
GFR calc non Af Amer: 43 mL/min — ABNORMAL LOW (ref >60)
Potassium: 3.9 mEq/L (ref 3.5–5.1)
Total Bilirubin: 0.6 mg/dL (ref 0.3–1.2)

## 2010-11-28 LAB — TYPE AND SCREEN

## 2010-11-28 LAB — DIFFERENTIAL
Basophils Absolute: 0 10*3/uL (ref 0.0–0.1)
Basophils Relative: 1 % (ref 0–1)
Eosinophils Relative: 3 % (ref 0–5)
Lymphocytes Relative: 38 % (ref 12–46)
Monocytes Absolute: 0.5 10*3/uL (ref 0.1–1.0)

## 2010-11-28 LAB — URINALYSIS, ROUTINE W REFLEX MICROSCOPIC
Bilirubin Urine: NEGATIVE
Glucose, UA: NEGATIVE mg/dL
Hgb urine dipstick: NEGATIVE
Ketones, ur: NEGATIVE mg/dL
Protein, ur: NEGATIVE mg/dL
pH: 5.5 (ref 5.0–8.0)

## 2010-11-28 LAB — BASIC METABOLIC PANEL
Chloride: 104 mEq/L (ref 96–112)
Creatinine, Ser: 1.41 mg/dL — ABNORMAL HIGH (ref 0.4–1.2)
GFR calc Af Amer: 46 mL/min — ABNORMAL LOW (ref >60)
Sodium: 135 mEq/L (ref 135–145)

## 2010-11-28 LAB — ABO/RH: ABO/RH(D): B POS

## 2010-12-03 LAB — DIFFERENTIAL
Basophils Absolute: 0 10*3/uL (ref 0.0–0.1)
Basophils Relative: 1 % (ref 0–1)
Eosinophils Absolute: 0 10*3/uL (ref 0.0–0.7)
Eosinophils Relative: 1 % (ref 0–5)
Lymphs Abs: 1.7 10*3/uL (ref 0.7–4.0)
Neutrophils Relative %: 59 % (ref 43–77)

## 2010-12-03 LAB — RAPID URINE DRUG SCREEN, HOSP PERFORMED
Barbiturates: NOT DETECTED
Opiates: NOT DETECTED

## 2010-12-03 LAB — CBC
HCT: 36.3 % (ref 36.0–46.0)
MCHC: 32.4 g/dL (ref 30.0–36.0)
MCV: 98.6 fL (ref 78.0–100.0)
Platelets: 261 10*3/uL (ref 150–400)
RDW: 13.3 % (ref 11.5–15.5)
WBC: 5.6 10*3/uL (ref 4.0–10.5)

## 2010-12-03 LAB — BASIC METABOLIC PANEL
BUN: 17 mg/dL (ref 6–23)
Chloride: 106 mEq/L (ref 96–112)
Creatinine, Ser: 1.19 mg/dL (ref 0.4–1.2)
Glucose, Bld: 98 mg/dL (ref 70–99)

## 2010-12-03 LAB — ETHANOL: Alcohol, Ethyl (B): <5 mg/dL (ref 0–10)

## 2010-12-04 LAB — POTASSIUM: Potassium: 4.1 mEq/L (ref 3.5–5.1)

## 2010-12-05 ENCOUNTER — Other Ambulatory Visit: Payer: Self-pay | Admitting: Internal Medicine

## 2010-12-05 DIAGNOSIS — Z1231 Encounter for screening mammogram for malignant neoplasm of breast: Secondary | ICD-10-CM

## 2010-12-07 ENCOUNTER — Encounter: Payer: Self-pay | Admitting: Internal Medicine

## 2010-12-08 ENCOUNTER — Other Ambulatory Visit: Payer: Self-pay | Admitting: Internal Medicine

## 2010-12-12 ENCOUNTER — Encounter: Payer: Self-pay | Admitting: Internal Medicine

## 2010-12-12 ENCOUNTER — Ambulatory Visit (INDEPENDENT_AMBULATORY_CARE_PROVIDER_SITE_OTHER): Payer: Medicare Other | Admitting: Internal Medicine

## 2010-12-12 VITALS — BP 120/80 | HR 72 | Ht 65.5 in | Wt 259.0 lb

## 2010-12-12 DIAGNOSIS — Z124 Encounter for screening for malignant neoplasm of cervix: Secondary | ICD-10-CM

## 2010-12-12 DIAGNOSIS — Z0000 Encounter for general adult medical examination without abnormal findings: Secondary | ICD-10-CM

## 2010-12-12 DIAGNOSIS — I10 Essential (primary) hypertension: Secondary | ICD-10-CM

## 2010-12-12 DIAGNOSIS — R7309 Other abnormal glucose: Secondary | ICD-10-CM

## 2010-12-12 DIAGNOSIS — E559 Vitamin D deficiency, unspecified: Secondary | ICD-10-CM

## 2010-12-12 DIAGNOSIS — D649 Anemia, unspecified: Secondary | ICD-10-CM

## 2010-12-12 DIAGNOSIS — E785 Hyperlipidemia, unspecified: Secondary | ICD-10-CM

## 2010-12-12 DIAGNOSIS — F329 Major depressive disorder, single episode, unspecified: Secondary | ICD-10-CM

## 2010-12-12 DIAGNOSIS — M1990 Unspecified osteoarthritis, unspecified site: Secondary | ICD-10-CM

## 2010-12-12 DIAGNOSIS — E669 Obesity, unspecified: Secondary | ICD-10-CM

## 2010-12-12 DIAGNOSIS — Z01419 Encounter for gynecological examination (general) (routine) without abnormal findings: Secondary | ICD-10-CM

## 2010-12-12 LAB — CBC WITH DIFFERENTIAL/PLATELET
Basophils Absolute: 0 10*3/uL (ref 0.0–0.1)
Eosinophils Relative: 3 % (ref 0.0–5.0)
HCT: 35.5 % — ABNORMAL LOW (ref 36.0–46.0)
Hemoglobin: 11.9 g/dL — ABNORMAL LOW (ref 12.0–15.0)
Lymphocytes Relative: 27.8 % (ref 12.0–46.0)
Lymphs Abs: 1.2 10*3/uL (ref 0.7–4.0)
Monocytes Relative: 10.2 % (ref 3.0–12.0)
Neutro Abs: 2.5 10*3/uL (ref 1.4–7.7)
RDW: 14.2 % (ref 11.5–14.6)
WBC: 4.3 10*3/uL — ABNORMAL LOW (ref 4.5–10.5)

## 2010-12-12 LAB — HEMOGLOBIN A1C: Hgb A1c MFr Bld: 5.8 % (ref 4.6–6.5)

## 2010-12-12 NOTE — Patient Instructions (Signed)
Calorie Counting Diet A calorie counting diet requires you to eat the number of calories that are right for you during a day. Calories are the measurement of how much energy you get from the food you eat. Eating the right amount of calories is important for staying at a healthy weight. If you eat too many calories your body will store them as fat and you may gain weight. If you eat too few calories you may lose weight. Counting the number of calories that you eat during a day will help you to know if you're eating the right amount. A Registered Dietitian can determine how many calories you need in a day. The amount of calories you need varies from person to person. If your goal is to lose weight you will need to eat fewer calories. Losing weight can benefit you if you are overweight or have health problems such as heart disease, high blood pressure or diabetes. If your goal is to gain weight, you will need to eat more calories. Gaining weight may be necessary if you have a certain health problem that causes your body to need more energy. TIPS Whether you are increasing or decreasing the number of calories you eat during a day, it may be hard to get used to changing what you eat and drink. The following are tips to help you keep track of the number of calories you are eating.  Measuring foods at home with measuring cups will help you to know the actual amount of food and number of calories you are eating.   Restaurants serve food in all different portion sizes. It is common that restaurants will serve food in amounts worth 2 or more serving sizes. While eating out, it may be helpful to estimate how many servings of a food you are given. For example, a serving of cooked rice is 1/2 cup and that is the size of half of a fist. Knowing serving sizes will help you have a better idea of how much food you are eating at restaurants.   Ask for smaller portion sizes or child-size portions at restaurants.   Plan to  eat half of a meal at a restaurant and take the rest home or share the other half with a friend   Read food labels for calorie content and serving size   Most packaged food has a Nutrition Facts Panel on its side or back. Here you can find out how many servings are in a package, the size of a serving, and the number of calories each serving has.   The serving size and number of servings per container are listed right below the Nutrition Facts heading. Just below the serving information, the number of calories in each serving is listed.   For example, say that a package has three cookies inside. The Nutrition Facts panel says that one serving is one cookie. Below that, it says that there are three servings in the container. The calories section of the Nutrition Facts says there are 90 calories. That means that there are 90 calories in one cookie. If you eat one cookie you have eaten 90 calories. If you eat all three cookies, you have eaten three times that amount, or 270 calories.  The list below tells you how big or small some common portion sizes are.  1 ounce (oz).................4 stacked dice.   3 oz.............................Marland KitchenDeck of cards.   1 teaspoon (tsp)..........Marland KitchenTip of little finger.   1 tablespoon (Tbsp).Marland KitchenMarland KitchenMarland KitchenTip of thumb.  2 Tbsp.........................Marland KitchenGolf ball.    Cup.........................Marland KitchenHalf of a fist.   1 Cup..........................Marland KitchenA fist.  KEEP A FOOD LOG Write down every food item that you eat, how much of the food you eat, and the number of calories in each food that you eat during the day. At the end of the day or throughout the day you can add up the total number of calories you have eaten.  It may help to set up a list like the one below. Find out the calorie information by reading food labels.  Breakfast   Bran Flakes (1 cup, 110 calories).   Fat free milk ( cup, 45 calories).   Snack   Apple (1 medium, 80 calories).   Lunch   Spinach (1  cup, 20 calories).   Tomato ( medium, 20 calories).   Chicken breast strips (3 oz, 165 calories).   Shredded cheddar cheese ( cup, 110 calories).   Light [place] dressing (2 Tbsp, 60 calories).   Whole wheat bread (1 slice, 80 calories).   Tub margarine (1 tsp, 35 calories).   Vegetable soup (1 cup, 160 calories).   Dinner   Pork chop (3 oz, 190 calories).   Brown rice (1 cup, 215 calories).   Steamed broccoli ( cup, 20 calories).   Strawberries (1  cup, 65 calories).   Whipped cream (1 Tbsp, 50 calories).  Daily Calorie Total: 1425 IDocument Released: 08/27/2005 Document Re-Released: 09/18/2009  Eat less at evening meal and have  Protein snacks in day as a meal such as yogurt . ,unsalted nuts, [place] 1/2 sandwich on whole grain bread.
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
## 2010-12-13 LAB — BASIC METABOLIC PANEL
CO2: 29 mEq/L (ref 19–32)
Chloride: 107 mEq/L (ref 96–112)
Creatinine, Ser: 1.4 mg/dL — ABNORMAL HIGH (ref 0.4–1.2)
Potassium: 4.8 mEq/L (ref 3.5–5.1)
Sodium: 144 mEq/L (ref 135–145)

## 2010-12-13 LAB — TSH: TSH: 1.96 u[IU]/mL (ref 0.35–5.50)

## 2010-12-13 LAB — HEPATIC FUNCTION PANEL
ALT: 17 U/L (ref 0–35)
AST: 17 U/L (ref 0–37)
Alkaline Phosphatase: 128 U/L — ABNORMAL HIGH (ref 39–117)
Bilirubin, Direct: 0.1 mg/dL (ref 0.0–0.3)
Total Bilirubin: 0.4 mg/dL (ref 0.3–1.2)
Total Protein: 6.8 g/dL (ref 6.0–8.3)

## 2010-12-13 LAB — VITAMIN B12: Vitamin B-12: 318 pg/mL (ref 211–911)

## 2010-12-13 LAB — LIPID PANEL
LDL Cholesterol: 60 mg/dL (ref 0–99)
Total CHOL/HDL Ratio: 2
Triglycerides: 40 mg/dL (ref 0.0–149.0)

## 2010-12-13 LAB — VITAMIN D 25 HYDROXY (VIT D DEFICIENCY, FRACTURES): Vit D, 25-Hydroxy: 44 ng/mL (ref 30–89)

## 2010-12-14 ENCOUNTER — Ambulatory Visit (HOSPITAL_COMMUNITY)
Admission: RE | Admit: 2010-12-14 | Discharge: 2010-12-14 | Disposition: A | Discharge status: Home / Self Care | Payer: Medicare Other | Source: Ambulatory Visit | Attending: Internal Medicine | Admitting: Internal Medicine

## 2010-12-14 DIAGNOSIS — Z1231 Encounter for screening mammogram for malignant neoplasm of breast: Secondary | ICD-10-CM | POA: Insufficient documentation

## 2010-12-15 ENCOUNTER — Encounter: Payer: Self-pay | Admitting: Internal Medicine

## 2010-12-15 DIAGNOSIS — E559 Vitamin D deficiency, unspecified: Secondary | ICD-10-CM | POA: Insufficient documentation

## 2010-12-15 DIAGNOSIS — Z0000 Encounter for general adult medical examination without abnormal findings: Secondary | ICD-10-CM | POA: Insufficient documentation

## 2010-12-19 ENCOUNTER — Other Ambulatory Visit: Payer: Self-pay | Admitting: *Deleted

## 2010-12-19 MED ORDER — ESOMEPRAZOLE MAGNESIUM 40 MG PO CPDR: 40.0000 mg | DELAYED_RELEASE_CAPSULE | Freq: Every day | ORAL | Status: DC

## 2010-12-25 ENCOUNTER — Encounter: Payer: Self-pay | Admitting: *Deleted

## 2010-12-31 ENCOUNTER — Observation Stay (HOSPITAL_COMMUNITY)
Admission: EM | Admit: 2010-12-31 | Discharge: 2011-01-02 | Disposition: A | Discharge status: Home / Self Care | Payer: Medicare Other | Attending: Plastic Surgery | Admitting: Plastic Surgery

## 2010-12-31 ENCOUNTER — Emergency Department (HOSPITAL_COMMUNITY): Payer: Medicare Other

## 2010-12-31 DIAGNOSIS — E669 Obesity, unspecified: Secondary | ICD-10-CM | POA: Insufficient documentation

## 2010-12-31 DIAGNOSIS — Z79899 Other long term (current) drug therapy: Secondary | ICD-10-CM | POA: Insufficient documentation

## 2010-12-31 DIAGNOSIS — W108XXA Fall (on) (from) other stairs and steps, initial encounter: Secondary | ICD-10-CM | POA: Insufficient documentation

## 2010-12-31 DIAGNOSIS — S62113A Displaced fracture of triquetrum [cuneiform] bone, unspecified wrist, initial encounter for closed fracture: Secondary | ICD-10-CM | POA: Insufficient documentation

## 2010-12-31 DIAGNOSIS — Y9229 Other specified public building as the place of occurrence of the external cause: Secondary | ICD-10-CM | POA: Insufficient documentation

## 2010-12-31 DIAGNOSIS — G5600 Carpal tunnel syndrome, unspecified upper limb: Secondary | ICD-10-CM | POA: Insufficient documentation

## 2010-12-31 DIAGNOSIS — M25569 Pain in unspecified knee: Secondary | ICD-10-CM | POA: Insufficient documentation

## 2010-12-31 DIAGNOSIS — S52539A Colles' fracture of unspecified radius, initial encounter for closed fracture: Principal | ICD-10-CM | POA: Insufficient documentation

## 2010-12-31 DIAGNOSIS — K219 Gastro-esophageal reflux disease without esophagitis: Secondary | ICD-10-CM | POA: Insufficient documentation

## 2010-12-31 DIAGNOSIS — I10 Essential (primary) hypertension: Secondary | ICD-10-CM | POA: Insufficient documentation

## 2010-12-31 LAB — BASIC METABOLIC PANEL
GFR calc Af Amer: 51 mL/min — ABNORMAL LOW (ref >60)
GFR calc non Af Amer: 42 mL/min — ABNORMAL LOW (ref >60)
Potassium: 4.1 mEq/L (ref 3.5–5.1)
Sodium: 138 mEq/L (ref 135–145)

## 2010-12-31 LAB — DIFFERENTIAL
Basophils Absolute: 0 10*3/uL (ref 0.0–0.1)
Eosinophils Absolute: 0.1 10*3/uL (ref 0.0–0.7)
Eosinophils Relative: 1 % (ref 0–5)
Lymphocytes Relative: 18 % (ref 12–46)

## 2010-12-31 LAB — CBC
Platelets: 285 10*3/uL (ref 150–400)
RDW: 13.2 % (ref 11.5–15.5)
WBC: 8.2 10*3/uL (ref 4.0–10.5)

## 2011-01-10 ENCOUNTER — Other Ambulatory Visit: Payer: Self-pay | Admitting: Internal Medicine

## 2011-01-26 NOTE — Op Note (Signed)
NAME:  [name], [name]                         ACCOUNT NO.:  [id number]   MEDICAL RECORD NO.:  [id number]                   PATIENT TYPE:  AMB   LOCATION:  ENDO                                 FACILITY:  [facility]   PHYSICIAN:  Anselmo Rod, M.D.               DATE OF BIRTH:  11-Sep-1950   DATE OF PROCEDURE:  01/03/2004  DATE OF DISCHARGE:                                 OPERATIVE REPORT   PROCEDURE PERFORMED:  Colonoscopy with snare polypectomy times three.   ENDOSCOPIST:  Charna Elizabeth, M.D.   INSTRUMENT USED:  Olympus video colonoscope.   INDICATIONS FOR PROCEDURE:  The patient is a 60 year old African-American  female with a history of adenomatous polyps removed in the past.  Undergoing  repeat colonoscopy for surveillance purposes.  Rule out recurrent polyps.   PREPROCEDURE PREPARATION:  Informed consent was procured from the patient.  The patient was fasted for eight hours prior to the procedure and prepped  with a bottle of magnesium citrate and a gallon of GoLYTELY the night prior  to the procedure.   PREPROCEDURE PHYSICAL:  The patient had stable vital signs.  Neck supple.  Chest clear to auscultation.  S1 and S2 regular.  Abdomen soft with normal  bowel sounds.   DESCRIPTION OF PROCEDURE:  The patient was placed in left lateral decubitus  position and sedated with 80 mg of Demerol and 10 mg of Versed  intravenously.  Once the patient was adequately sedated and maintained on  low flow oxygen and continuous cardiac monitoring, the Olympus video  colonoscope was advanced from the rectum to the cecum with difficulty.  The  patient's position had to be changed from the left lateral to the spine and  the right lateral position with gentle application of abdominal pressure to  reach the cecum.  The appendicular orifice and ileocecal valve were  visualized and photographed.  Two small sessile polyps were snared from the  mid right colon.  There were few early sigmoid diverticula  noted.  A small  sessile polyp was snared from the rectum (cold snare).  A small sessile  polyp was biopsied from the mid right colon as well.  Another small polyp  was ablated in the mid right colon.  There was some residual stool in the  colon multiple washes were done.  Retroflexion in the rectum revealed  internal hemorrhoids.  The patient tolerated the procedure well without  immediate complications.   IMPRESSION:  1. Internal hemorrhoids.  2. Early sigmoid diverticulosis.  3. Once small sessile polyp removed by cold snare from the rectum.  4. Two small sessile polyps snared from the mid right colon, one polyp     biopsied from the mid right colon, one polyp ablated in the mid right     colon.   RECOMMENDATIONS:  1. Await pathology results.  2. Avoid all nonsteroidals including aspirin for now.  3. Repeat CRC  screening depending on pathology results.                                               Anselmo Rod, M.D.    JNM/MEDQ  D:  01/03/2004  T:  01/03/2004  Job:  [id number]   cc:   Billey Gosling, M.D.  [address] [place], [place] [address]  Fax: [phone]

## 2011-01-26 NOTE — Procedures (Signed)
[place]  Patient:    [name], [name]                      MRN: [id number] Proc. Date: 02/05/01 Adm. Date:  [id number] Attending:  Charna Elizabeth CC:         Pricilla Holm, M.D.   Procedure Report  DATE OF BIRTH: 04/10/1951  PROCEDURE: Colonoscopy with hot biopsy x 6.  ENDOSCOPIST: Anselmo Rod, M.D.  INSTRUMENT USED: Olympus video colonoscope.  INDICATIONS FOR PROCEDURE: This is a 60 year old African-American female with a history of colon cancer in a sister diagnosed when in her mid 19s, to rule out colon polyps, masses, hemorrhoids, etc.  PREPROCEDURE PREPARATION: Informed consent was procured from the patient.  The patient fasted for eight hours prior to the procedure.  PREPROCEDURE PHYSICAL EXAMINATION:  VITAL SIGNS: Stable.  NECK: Supple.  CHEST: Clear to auscultation.  HEART: Sinus, irregular.  ABDOMEN: Soft, with normal bowel sounds.  DESCRIPTION OF PROCEDURE: The patient was placed in the left lateral decubitus position and sedated with 60 mg Demerol and 6 mg of Versed intravenously. Once the patient was adequately sedated and maintained on low-flow oxygen and continuous cardiac monitoring the Olympus video colonoscope was advanced from the rectum to the cecum, with slight difficulty secondary to some residual stool in the colon.  Four small sessile polyps were hot biopsied from the cecum and another small polyp hot biopsied from 50 cm.  There was an isolated diverticulum in the left colon, no other masses or polyps seen.  Again, small lesions could have been missed secondary to relatively poor prep.  RECOMMENDATIONS:  1. Await pathology results.  2. Outpatient follow-up in the next two to three weeks. DD:  02/05/01 TD:  02/05/01 Job: [id number]

## 2011-02-15 NOTE — Op Note (Signed)
NAMESCHELLY, CHUBA               ACCOUNT NO.:  [id number]  MEDICAL RECORD NO.:  [id number]           PATIENT TYPE:  O  LOCATION:  5002                         FACILITY:  [facility]  PHYSICIAN:  Loreta Ave, MD DATE OF BIRTH:  1951-07-11  DATE OF PROCEDURE:  12/31/2010 DATE OF DISCHARGE:  12/29/2010                              OPERATIVE REPORT   SURGEON:  Loreta Ave, MD  PREOPERATIVE DIAGNOSES: 1. Right comminuted intra-articular distal radius fracture. 2. Acute carpal tunnel syndrome. 3. Right triquetral fracture.  PROCEDURES PERFORMED: 1. Open reduction and internal fixation of right distal radius     fracture, five-part fracture. 2. Open carpal tunnel release. 3. Close treatment of right triquetral fracture.  ANESTHESIA:  General.  TOURNIQUET TIME:  2 hours and 15 minutes at 250 mmHg.  IV FLUIDS:  Per Anesthesia.  URINE OUTPUT:  Not recorded.  ESTIMATED BLOOD LOSS:  5 mL.  COMPONENTS USED:  Acumed Acu-Loc standard, distal radius plate.  COMPLICATIONS:  None.  CLINICAL INDICATION:  [name] is a 60 year old right-hand-dominant female who had a fall from standing height onto an outstretched right hand earlier today.  She sustained a complex comminuted intra-articular right distal radius fracture as well as the right triquetral fracture. She immediately developed carpal tunnel syndrome.  Again, they felt that closed reduction was going to in no way provide stability.  Secondary to the complex and comminuted nature of her fracture, I have told her I felt the best course of action especially for median nerve function would be to stabilize her fracture today and perform carpel tunnel release.  The patient, her family, and I had a lengthy discussion about the alternatives, risks, and benefits of surgery.  She understood the risks of surgery to include but not be limited to bleeding, infection, damage to nearby structures including median nerve, muscle  tendon, bone, blood vessels, and cartilage as well as nonunion, malunion of both of her fractures as well as the need for future surgery including a total wrist arthrodesis.  She desired to proceed.  DESCRIPTION OF OPERATION:  The patient was brought to the operating room, placed in supine position on the operating room table.  Ancef 1 g was given.  After smooth and routine induction of general anesthesia, the right upper extremity had a well-padded pneumatic tourniquet placed on the arm.  The right upper extremity was prepped with DuraPrep and draped into a sterile field.  Next, an Esmarch bandage was used to exsanguinate the right upper extremity.  Tourniquet was inflated 250 mmHg.  A surgical time-out was performed.  First, a standard 2.5-cm mini open carpal tunnel incision was made along the radial side of the third ray overlying the transverse carpal ligament.  The skin and subcutaneous tissues were divided sharply and superficial vessels controlled the bipolar electrocautery.  Next, the ulnar fascia was incised and the transverse carpal ligament was identified.  It was pierced with mid substance with a 15 blade and then divided stepwise moving distally under direct vision to the level of the palmar fat pad.  Next, blunt dissection was carried out both above and below the transverse  carpal ligament to free any attachments.  Next, it was again divided stepwise under direct vision with tenotomy scissors into the forearm.  Both the proximal and distal extents were doubly checked to ensure complete release.  Next, the wound was irrigated and closed with interrupted 4-0 nylon horizontal mattress sutures.  Attention was then turned to the distal radius.  The standard volar Henry incision was made overlying the flexor carpi radialis and superficial vessels were again controlled with bipolar electrocautery.  Next, FCR tendon and sheath was incised and the FCR tendon sheath was retracted  ulnarly.  The radial artery was retracted radially.  A blunt dissection proceeded to the level of the pronator quadratus which was found to be lacerated in multiple pieces secondary to the complex nature of the distal radius fracture.  This was incised with a 15 blade and elevated subperiosteally off the distal radius and its fracture fragments.  Next, using a combination of Freer curette and irrigation, hematoma was removed from the fracture site and the fracture fragments were freed up. A long plate was needed to stabilize this distal radius fragment secondary to the proximal extension of instability.  A standard long distal radius plate was brought onto the field and provisionally attached to the shaft of the distal radius with K-wires.  Next, the distal fragments were elevated into place and provisionally fixed with K- wires across the distal articular surface of the distal radius.  Next, 10 pounds of countertraction were hanged off the fingertips using finger traps to provide ligament taxis and facilitate reduction.  The fracture was identified as being at least five piece and intra-articular.  Next, the reduced fragments were provisionally affixed to the distal radius plate and the construct was assessed with the fluoroscopy.  Reduction and hardware placement were found to be adequate.  Next, a 2.8-mm drill was used to place a nonlocking screw and the oval proximal hole of the plate, which affixed the plate to the radial shaft.  Next, the distal walls were sequentially drilled, measured, and filled from radial to ulnar.  The first screw was placed in the distal most radial position and was fully-threaded nonlocking to lag the distal radius piece to the plate.  The remainder of the screws placed were in fact locking pegs.  Again, the construct was assessed with fluoroscopy and the hardware was found not to violate the radiocarpal joint and to provide good subchondral and stable  support of the distal radius fragments.  Next, two 3.5-mm locking screws were placed into the shaft of the plate to complete this construct.  This was then assessed with PA, lateral, and oblique lateral (Sunrise) use of the distal radius. Next, 4 mL of DBX bone graft were implanted through a bony defect on the radial side.  Next, 20 mL of 0.5% Marcaine plain were injected about the surgical site for postoperative analgesia.  This one was closed with interrupted buried 4-0 Monocryl sutures.  Sponge and needle counts reported as correct x2.  Next, a clamshell volar resting splint was then applied after the tourniquet was deflated.  The patient was then transferred to the recovery room in stable condition.    Loreta Ave, MD    CF/MEDQ  D:  12/31/2010  T:  01/01/2011  Job:  [id number]  Electronically Signed by Loreta Ave MD on 02/15/2011 02:14:15 PM

## 2011-03-07 ENCOUNTER — Other Ambulatory Visit: Payer: Self-pay | Admitting: Internal Medicine

## 2011-05-02 ENCOUNTER — Other Ambulatory Visit: Payer: Self-pay | Admitting: Internal Medicine

## 2011-05-08 ENCOUNTER — Telehealth: Payer: Self-pay

## 2011-05-08 NOTE — Telephone Encounter (Signed)
Pt will try to come by office tomorrow to sign medical release form to have a copy of her problem/medicine list faxed to a doctor that she is going to see concerning the break in her arm from earlier this year

## 2011-05-08 NOTE — Telephone Encounter (Signed)
Pt left a message but message is unclear as to what it is that pt needs. Pt left a fax number [phone].  Returned call to pt for further details of need. Left message for pt to call back

## 2011-06-01 ENCOUNTER — Ambulatory Visit: Payer: Medicare Other | Admitting: Internal Medicine

## 2011-06-04 ENCOUNTER — Telehealth: Payer: Self-pay | Admitting: *Deleted

## 2011-06-04 ENCOUNTER — Encounter: Payer: Self-pay | Admitting: Internal Medicine

## 2011-06-04 ENCOUNTER — Other Ambulatory Visit: Payer: Self-pay | Admitting: Internal Medicine

## 2011-06-04 ENCOUNTER — Encounter: Payer: Medicare Other | Admitting: *Deleted

## 2011-06-04 ENCOUNTER — Ambulatory Visit (INDEPENDENT_AMBULATORY_CARE_PROVIDER_SITE_OTHER): Payer: Medicare Other | Admitting: Internal Medicine

## 2011-06-04 VITALS — BP 120/80 | HR 88 | Wt 282.0 lb

## 2011-06-04 DIAGNOSIS — R635 Abnormal weight gain: Secondary | ICD-10-CM | POA: Insufficient documentation

## 2011-06-04 DIAGNOSIS — Z23 Encounter for immunization: Secondary | ICD-10-CM

## 2011-06-04 DIAGNOSIS — I10 Essential (primary) hypertension: Secondary | ICD-10-CM

## 2011-06-04 DIAGNOSIS — R609 Edema, unspecified: Secondary | ICD-10-CM

## 2011-06-04 DIAGNOSIS — E669 Obesity, unspecified: Secondary | ICD-10-CM

## 2011-06-04 LAB — HEPATIC FUNCTION PANEL
AST: 19 U/L (ref 0–37)
Alkaline Phosphatase: 154 U/L — ABNORMAL HIGH (ref 39–117)
Total Bilirubin: 0.5 mg/dL (ref 0.3–1.2)

## 2011-06-04 LAB — CBC WITH DIFFERENTIAL/PLATELET
Basophils Relative: 0.6 % (ref 0.0–3.0)
Eosinophils Absolute: 0.1 10*3/uL (ref 0.0–0.7)
Lymphs Abs: 1.2 10*3/uL (ref 0.7–4.0)
MCHC: 32.4 g/dL (ref 30.0–36.0)
MCV: 98.2 fl (ref 78.0–100.0)
Monocytes Absolute: 0.4 10*3/uL (ref 0.1–1.0)
Neutrophils Relative %: 57.1 % (ref 43.0–77.0)
RBC: 3.6 Mil/uL — ABNORMAL LOW (ref 3.87–5.11)

## 2011-06-04 LAB — BASIC METABOLIC PANEL
CO2: 28 mEq/L (ref 19–32)
Calcium: 8.8 mg/dL (ref 8.4–10.5)
Creatinine, Ser: 1.3 mg/dL — ABNORMAL HIGH (ref 0.4–1.2)
Glucose, Bld: 100 mg/dL — ABNORMAL HIGH (ref 70–99)
Sodium: 141 mEq/L (ref 135–145)

## 2011-06-04 NOTE — Telephone Encounter (Signed)
Per [facility]- Pt called saying that she couldn't go get the doppler done today. She didn't have a ride to get home today. I spoke with pt and she is going to call over and reschedule this for tomorrow afternoon and get her niece to take her.

## 2011-06-04 NOTE — Patient Instructions (Signed)
Get doppler  To  R/o blood clot in the leg  Unsure what is causing this but  Increase sodium and inactivity and medication could cause this. Will notify you  of labs when available. And then plan follow up.
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
## 2011-06-05 ENCOUNTER — Other Ambulatory Visit: Payer: Self-pay | Admitting: Internal Medicine

## 2011-06-05 ENCOUNTER — Other Ambulatory Visit: Payer: Medicare Other

## 2011-06-05 DIAGNOSIS — R609 Edema, unspecified: Secondary | ICD-10-CM

## 2011-06-06 ENCOUNTER — Other Ambulatory Visit: Payer: Self-pay | Admitting: Internal Medicine

## 2011-06-06 ENCOUNTER — Telehealth: Payer: Self-pay | Admitting: Internal Medicine

## 2011-06-06 ENCOUNTER — Ambulatory Visit
Admission: RE | Admit: 2011-06-06 | Discharge: 2011-06-06 | Disposition: A | Discharge status: Home / Self Care | Payer: Medicare Other | Source: Ambulatory Visit | Attending: Internal Medicine | Admitting: Internal Medicine

## 2011-06-06 ENCOUNTER — Telehealth: Payer: Self-pay | Admitting: *Deleted

## 2011-06-06 DIAGNOSIS — R609 Edema, unspecified: Secondary | ICD-10-CM

## 2011-06-06 MED ORDER — FUROSEMIDE 20 MG PO TABS: 20.0000 mg | ORAL_TABLET | Freq: Every day | ORAL | Status: DC

## 2011-06-06 NOTE — Telephone Encounter (Signed)
Need order that says Lower Left Extremity Venous Ultrasound. Pls fax order asap today to fax # [phone]

## 2011-06-06 NOTE — Telephone Encounter (Signed)
[name] refaxed order as noted below.

## 2011-06-06 NOTE — Telephone Encounter (Signed)
Pt aware of results Rx sent to pharmacy 

## 2011-06-06 NOTE — Telephone Encounter (Signed)
Message copied by Romualdo Bolk on Wed Jun 06, 2011  5:02 PM ------      Message from: [facility], [place] [facility]      Created: Wed Jun 06, 2011  4:57 PM       Advise patient no blood clot in legs      Lab show some anemia that is  No change and slight decrease in [place] fucntion but better that before.      Slight abnormality of liver test that may be insignificant.            No obvious cause of the swelling.    She needs to decrease salt /sodium  In her diet and lose weight . If needed we can use  A few days of stronger  Diuretic : lasix 20 mg qd for 5 days only . Disp lasix 20 mg disp 10  No refills      return office visit in 1 month.

## 2011-06-13 ENCOUNTER — Other Ambulatory Visit: Payer: Self-pay | Admitting: Internal Medicine

## 2011-06-18 ENCOUNTER — Ambulatory Visit: Payer: Medicare Other | Admitting: Internal Medicine

## 2011-06-28 ENCOUNTER — Encounter: Payer: Medicare Other | Admitting: Cardiology

## 2011-07-05 ENCOUNTER — Ambulatory Visit (INDEPENDENT_AMBULATORY_CARE_PROVIDER_SITE_OTHER): Payer: Medicare Other | Admitting: Internal Medicine

## 2011-07-05 ENCOUNTER — Encounter: Payer: Self-pay | Admitting: Internal Medicine

## 2011-07-05 VITALS — BP 140/80 | HR 72 | Wt 283.0 lb

## 2011-07-05 DIAGNOSIS — I10 Essential (primary) hypertension: Secondary | ICD-10-CM

## 2011-07-05 DIAGNOSIS — E669 Obesity, unspecified: Secondary | ICD-10-CM

## 2011-07-05 DIAGNOSIS — M1990 Unspecified osteoarthritis, unspecified site: Secondary | ICD-10-CM

## 2011-07-05 DIAGNOSIS — D649 Anemia, unspecified: Secondary | ICD-10-CM

## 2011-07-05 DIAGNOSIS — R635 Abnormal weight gain: Secondary | ICD-10-CM

## 2011-07-05 DIAGNOSIS — Z96659 Presence of unspecified artificial knee joint: Secondary | ICD-10-CM

## 2011-07-05 DIAGNOSIS — R609 Edema, unspecified: Secondary | ICD-10-CM

## 2011-07-05 DIAGNOSIS — R229 Localized swelling, mass and lump, unspecified: Secondary | ICD-10-CM

## 2011-07-05 LAB — POCT URINALYSIS DIPSTICK
Blood, UA: NEGATIVE
Ketones, UA: NEGATIVE
Leukocytes, UA: NEGATIVE
Protein, UA: NEGATIVE
Spec Grav, UA: 1.03
pH, UA: 5.5

## 2011-07-05 NOTE — Patient Instructions (Signed)
I am not sure why you are having  Swelling on one side only.  However weight gain  Could be contributing  Will get a consult from vascular .

## 2011-07-05 NOTE — Progress Notes (Signed)
  Subjective:    Patient ID: [name], female    DOB: February 25, 1951, 60 y.o.   MRN: [id number]  HPI Patient comes in today for follow up of  multiple medical problems.   HT stable Unilateral swelling  Apparently no change  No new sob but feels could be from weight . No fever and no cough. No change in meds  Lasix didn't make a difference no nsaid.  Knows she needs to lose weight.  NO change in vision and no UTI diabetes sx.  Review of Systems No fever syncope or fall bleeding  New joint swelling .  Rest as per hpi  Past history family history social history reviewed in the electronic medical record.     Objective:   Physical Exam Wt Readings from Last 3 Encounters:  07/05/11 283 lb (128.368 kg)  06/04/11 282 lb (127.914 kg)  12/12/10 259 lb (117.482 kg)   WDWN on nad LLe larger than right   Right arm in splint  Left and arm puffy but no pitting edema.   No lesion neck  No masses  Chest:  Clear to A&P without wheezes rales or rhonchi CV:  S1-S2 no gallops or murmurs peripheral perfusion is normal LEgs left larger than right but no anasarca and no redness or cords  No ulcers  ? Som chronic skin changes. Gait antalgic   Lab Results  Component Value Date   WBC 4.0* 06/04/2011   HGB 11.0 07/05/2011   HCT 35.3* 06/04/2011   PLT 252.0 06/04/2011   GLUCOSE 100* 06/04/2011   CHOL 141 12/12/2010   TRIG 40.0 12/12/2010   HDL 73.10 12/12/2010   LDLCALC 60 12/12/2010   ALT 17 06/04/2011   AST 19 06/04/2011   NA 141 06/04/2011   K 4.7 06/04/2011   CL 104 06/04/2011   CREATININE 1.3* 06/04/2011   BUN 19 06/04/2011   CO2 28 06/04/2011   TSH 2.39 06/04/2011   INR 2.33* 06/27/2010   HGBA1C 5.8 12/12/2010   MICROALBUR 0.2 12/03/2007       Assessment & Plan:  Swelling :    One sided  Reported although one arm in spllnt and hard to tell UE issue. Renal insufficiency f but stable andNot  on nsaid or other new meds. Hew weight gain is significant but cannot tell if  Life style related or not. HT  stable Hyperglycemia: about the same Plan  Lose weight  Get consult

## 2011-07-05 NOTE — Assessment & Plan Note (Signed)
Reports swelling left leg and left arm  Unresponsive to lasix and had neg Doppler for dvt.   No obv obstructive sx but large weight limits sensitivity of exam   There is definite asymmetry of legs.    Hard to tell with arms with right in a splint.  No ob renal decompensation.  Obesity contributes.  Will get vascular consult ? Any evidence of obstructive sx  Or other atypical venous disease. In the meantime  Disc importance of  Healthy weight loss

## 2011-07-25 ENCOUNTER — Encounter: Payer: Self-pay | Admitting: Vascular Surgery

## 2011-08-06 ENCOUNTER — Telehealth: Payer: Self-pay | Admitting: Family Medicine

## 2011-08-06 NOTE — Telephone Encounter (Signed)
Pulled from Triage vmail. Patient has bad cough and it makes it hard to get out of bed. Please call & advise.

## 2011-08-07 NOTE — Telephone Encounter (Signed)
Per Dr. Fabian Sharp- ov tomorrow if can wait. Need more info on what is going on with throat. Left message to call back

## 2011-08-07 NOTE — Telephone Encounter (Signed)
Patient is calling back.  She had lost her phone but has found it.

## 2011-08-07 NOTE — Telephone Encounter (Signed)
Left message to call back  

## 2011-08-07 NOTE — Telephone Encounter (Signed)
Patient is feeling a little better today.  She is still having lower back pain.  No dysuria, fever or injury.  The cough is ongoing.  Feels like "something stuck in the throat".   Any suggestions?  Patient would like to be seen today if possible.

## 2011-08-07 NOTE — Telephone Encounter (Signed)
Left message on machine for patient to return our call 

## 2011-08-09 ENCOUNTER — Encounter: Payer: Self-pay | Admitting: Vascular Surgery

## 2011-08-09 NOTE — Telephone Encounter (Signed)
Pt called and stated she was feeling fine on yesterday.

## 2011-08-10 ENCOUNTER — Ambulatory Visit (INDEPENDENT_AMBULATORY_CARE_PROVIDER_SITE_OTHER): Payer: Medicare Other | Admitting: Vascular Surgery

## 2011-08-10 ENCOUNTER — Encounter: Payer: Self-pay | Admitting: Vascular Surgery

## 2011-08-10 VITALS — BP 161/104 | HR 89 | Resp 16 | Ht 66.0 in | Wt 285.0 lb

## 2011-08-10 DIAGNOSIS — M7989 Other specified soft tissue disorders: Secondary | ICD-10-CM
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
## 2011-08-15 ENCOUNTER — Telehealth: Payer: Self-pay | Admitting: Internal Medicine

## 2011-08-16 NOTE — Telephone Encounter (Signed)
Last vit d level was on 12/22/10 level was 44. Please advise on this refill request. Would you like her to start taking OTC Vitamin D?

## 2011-08-16 NOTE — Telephone Encounter (Signed)
Per Dr. Fabian Sharp- Pt needs to take vitamin d 1000 iu daily instead. Left message on machine about this.

## 2011-08-22 ENCOUNTER — Other Ambulatory Visit: Payer: Self-pay | Admitting: Internal Medicine

## 2011-08-23 NOTE — Telephone Encounter (Signed)
Per Dr. Fabian Sharp- needs to change to 1000 IU daily. Left message on machine about this.

## 2011-08-23 NOTE — Telephone Encounter (Signed)
Last vit d level was on 12/12/10- 44 Please advise on what you would like pt to do.

## 2011-09-05 ENCOUNTER — Other Ambulatory Visit: Payer: Self-pay | Admitting: Internal Medicine

## 2011-09-13 ENCOUNTER — Other Ambulatory Visit: Payer: Self-pay | Admitting: Internal Medicine

## 2011-09-14 ENCOUNTER — Other Ambulatory Visit: Payer: Medicare Other

## 2011-09-14 ENCOUNTER — Ambulatory Visit: Payer: Medicare Other | Admitting: Vascular Surgery

## 2011-10-01 ENCOUNTER — Telehealth: Payer: Self-pay | Admitting: Internal Medicine

## 2011-10-01 NOTE — Telephone Encounter (Signed)
Ref[facility] Tr[facility]adol to [facility]. Pt has 1 pill left. Thanks.

## 2011-10-01 NOTE — Telephone Encounter (Signed)
I dont see that I have been prescribing  her tramadol in the ehr.   Please search ehr and or call pharmacy about which provider has been managing her pain meds   ? Ortho?

## 2011-10-01 NOTE — Telephone Encounter (Signed)
Pt last seen 08/10/11.  Pls advise.

## 2011-10-01 NOTE — Telephone Encounter (Signed)
  Please document    The prescribing doctor with the medication if you know how to do this. So the confusion will not continue in the future.

## 2011-10-01 NOTE — Telephone Encounter (Signed)
Pt states Dr. Fabian Sharp has been prescribing tramadol.  Pt states the orthopedic specialist was prescribing percocet.    Called the pharmacy and spoke with Gearldine Bienenstock pt's medication was sent in on Jan. 15 and again today by Dr. Marshia Ly.    Left a message for pt to return call.

## 2011-10-02 NOTE — Telephone Encounter (Signed)
Already done

## 2011-10-04 ENCOUNTER — Encounter: Payer: Self-pay | Admitting: Vascular Surgery

## 2011-10-05 ENCOUNTER — Encounter: Payer: Self-pay | Admitting: Vascular Surgery

## 2011-10-05 ENCOUNTER — Ambulatory Visit (INDEPENDENT_AMBULATORY_CARE_PROVIDER_SITE_OTHER): Payer: Medicare Other | Admitting: Vascular Surgery

## 2011-10-05 ENCOUNTER — Other Ambulatory Visit: Payer: Medicare Other

## 2011-10-05 ENCOUNTER — Other Ambulatory Visit (INDEPENDENT_AMBULATORY_CARE_PROVIDER_SITE_OTHER): Payer: Medicare Other | Admitting: *Deleted

## 2011-10-05 ENCOUNTER — Ambulatory Visit: Payer: Medicare Other | Admitting: Vascular Surgery

## 2011-10-05 VITALS — BP 113/74 | HR 92 | Resp 20 | Ht 66.0 in | Wt 274.0 lb

## 2011-10-05 DIAGNOSIS — I872 Venous insufficiency (chronic) (peripheral): Secondary | ICD-10-CM

## 2011-10-05 DIAGNOSIS — M7989 Other specified soft tissue disorders: Secondary | ICD-10-CM

## 2011-10-05 DIAGNOSIS — M79609 Pain in unspecified limb: Secondary | ICD-10-CM

## 2011-10-05 DIAGNOSIS — R609 Edema, unspecified: Secondary | ICD-10-CM

## 2011-10-05 NOTE — Progress Notes (Signed)
[facility] [place]  Established Swollen Leg History of Present Illness  [name] is a 61 y.o. female who presents with chief complaint: follow on venous insufficiency scan.  The pt continues to have swelling intermittently in the left leg.  The left arm swelling has resolved.  The patient has not starting on compression stockings.  Past Medical History, Past Surgical History, Social History, Family History, Medications, Allergies, and Review of Systems are unchanged from previous evaluation on 08/10/11.  Physical Examination  Filed Vitals:   10/05/11 1202  BP: 113/74  Pulse: 92  Resp: 20  Height: 5\' 6"  (1.676 m)  Weight: 274 lb (124.286 kg)  SpO2: 97%    General: A&O x 3, WDWN  Eyes: PERRLA, EOMI  Pulmonary: Sym exp, good air movt, CTAB, no rales, rhonchi, & wheezing  Cardiac: RRR, Nl S1, S2, no Murmurs, rubs or gallops  Vascular:  Vessel  Right  Left   Radial  Faintly Palpable  Palpable   Brachial  Palpable  Palpable   Carotid  Palpable, without bruit  Palpable, without bruit   Aorta  Non-palpable due to obesity  N/A   Femoral  Palpable  Palpable   Popliteal  Non-palpable  Non-palpable   PT  Non-Palpable  Non-Palpable   DP  Palpable  Palpable   Gastrointestinal: soft, NTND, -G/R, - HSM, - masses, - CVAT B, large pannus   Musculoskeletal: M/S 5/5 throughout , Extremities without ischemic changes , no lipodermatosclerosis, body habitus and distribution of fat c/w lipedema, no swelling, some varicosities evident in both leg  Neurologic: Pain and light touch intact in extremities , Motor exam as listed above  Non-Invasive Vascular Imaging BLE venous insufficiency duplex (Date: 10/05/11)  RLE: No GSV reflux, no deep reflux  LLE: No GSV reflux, Severe reflux in L deep system  Medical Decision Making  [name] is a 61 y.o. female who presents with: L CVI.   Based on the patient's vascular studies and examination, I have offered  the patient: compression stockings.  After a 3 month trial of the compression stockings, the patient will follow up with Vein Clinic  Again, L CVI should not give you L arm swelling, so this patient has to have another diagnosis to account for that sx.  Thank you for allowing [place] to participate in this patient's care.  Leonides Sake, MD [facility] [place] Office: [phone] Pager: [phone]

## 2011-10-11 NOTE — Procedures (Unsigned)
LOWER EXTREMITY VENOUS REFLUX EXAM  INDICATION:  Left lower extremity edema.  EXAM:  Using color-flow imaging and pulse Doppler spectral analysis, the right and left common femoral, femoral, popliteal, posterior tibial, great and small saphenous veins are evaluated.  There is evidence suggesting deep venous insufficiency in the left lower extremity.  The left saphenofemoral junction is not competent with Reflux of >534milliseconds. The left great saphenous vein is not competent with Reflux of >519milliseconds at the level of the saphenofemoral junction only.  The left proximal small saphenous vein demonstrates competency.  The right proximal small saphenous vein demonstrates competency.  The right saphenofemoral junction is competent.  The right great saphenous vein is competent.  GSV Diameter (used if found to be incompetent only)                                           Right    Left Proximal Greater Saphenous Vein           cm       0.80 cm Proximal-to-mid-thigh                     cm       cm Mid thigh                                 cm       cm Mid-distal thigh                          cm       cm Distal thigh                              cm       cm Knee                                      cm       cm  IMPRESSION: 1. The left great saphenous vein is not competent with Reflux of     >532milliseconds only at the level of the saphenofemoral junction. 2. The right deep and superficial system is competent. 3. The left great saphenous vein is not tortuous. 4. The left deep venous system is not competent with Reflux of     >520milliseconds. 5. The left small saphenous vein is competent.  ___________________________________________ Fransisco Hertz, MD  EM/MEDQ  D:  10/05/2011  T:  10/05/2011  Job:  [id number]

## 2011-10-15 ENCOUNTER — Telehealth: Payer: Self-pay | Admitting: *Deleted

## 2011-10-15 NOTE — Telephone Encounter (Signed)
(  triage voicemail)  Pt states she was advised referral for a neuro test needs to come from her pcp.  She has seen Dr. Avie Echevaria before (tel # [phone]).  Please send a referral.

## 2011-10-15 NOTE — Telephone Encounter (Signed)
Pls advise.  

## 2011-10-16 NOTE — Telephone Encounter (Signed)
i dont know what this is for. Please call oat and see what is going on.

## 2011-10-18 ENCOUNTER — Telehealth: Payer: Self-pay | Admitting: *Deleted

## 2011-10-18 NOTE — Telephone Encounter (Signed)
Spoke to pt- she is needing a referral to Dr. Sandria Manly because she is seeing things and hearing things. Her psych is saying that she needs to see Dr. Sandria Manly as well. They said that there is nothing mentally wrong with her. This has been going for 3-4 months now.

## 2011-10-18 NOTE — Telephone Encounter (Signed)
Pt is asking to have [name] call her.  Attempted to call her back, but the phone hangs up.

## 2011-10-18 NOTE — Telephone Encounter (Signed)
See previous phone note.  

## 2011-10-18 NOTE — Telephone Encounter (Signed)
Left message for pt to call back  °

## 2011-10-19 NOTE — Telephone Encounter (Signed)
Per Dr. Fabian Sharp- needs a referral note from psych and rov with md.

## 2011-10-22 NOTE — Telephone Encounter (Signed)
Spoke with pt and told her to have psych send [place] a note about this and then we will schedule a follow up with Dr. Fabian Sharp to discuss these issues.

## 2011-10-31 ENCOUNTER — Telehealth: Payer: Self-pay | Admitting: *Deleted

## 2011-10-31 NOTE — Telephone Encounter (Signed)
Counselor didn't say that anything about having to refer her to Dr. Sandria Manly for see or hearing things. Pt told her that we needed to talk them about getting info about a refer for her headaches. Pt is seeing the counselor tomorrow and counselor is going to get a sign release to talk to the [facility]. Counselor is going to talk to the [facility] about this as well.

## 2011-10-31 NOTE — Telephone Encounter (Signed)
Referred to [facility] also to help with coordination  And comunication

## 2011-11-02 ENCOUNTER — Telehealth: Payer: Self-pay | Admitting: *Deleted

## 2011-11-02 NOTE — Telephone Encounter (Signed)
Spoke to pt and she states she needs a referral to Dr. Sandria Manly because she is hearing things, seeing things and has headaches. I asked pt if she has seen Dr. Sandria Manly before and she states that she has. I advised her to see if she could call over to Dr. Imagene Gurney office and set up her own appt then let us know what is going.

## 2011-11-02 NOTE — Telephone Encounter (Signed)
Pt needs to talk to [facility] to see if she has received a fax from a Physician that Dr. Fabian Sharp spoke to?  She does not know the name except that it is a [facility]???

## 2011-11-06 ENCOUNTER — Other Ambulatory Visit: Payer: Self-pay | Admitting: Internal Medicine

## 2011-11-13 ENCOUNTER — Other Ambulatory Visit: Payer: Self-pay | Admitting: Internal Medicine

## 2011-11-26 ENCOUNTER — Other Ambulatory Visit: Payer: Self-pay | Admitting: Internal Medicine

## 2011-11-26 DIAGNOSIS — Z1231 Encounter for screening mammogram for malignant neoplasm of breast: Secondary | ICD-10-CM

## 2011-11-27 ENCOUNTER — Other Ambulatory Visit: Payer: Self-pay | Admitting: Internal Medicine

## 2011-12-11 ENCOUNTER — Other Ambulatory Visit: Payer: Self-pay | Admitting: Internal Medicine

## 2011-12-21 ENCOUNTER — Ambulatory Visit (HOSPITAL_COMMUNITY)
Admission: RE | Admit: 2011-12-21 | Discharge: 2011-12-21 | Disposition: A | Discharge status: Home / Self Care | Payer: Medicare Other | Source: Ambulatory Visit | Attending: Internal Medicine | Admitting: Internal Medicine

## 2011-12-21 DIAGNOSIS — Z1231 Encounter for screening mammogram for malignant neoplasm of breast: Secondary | ICD-10-CM | POA: Insufficient documentation

## 2012-01-03 ENCOUNTER — Other Ambulatory Visit: Payer: Self-pay | Admitting: Internal Medicine

## 2012-01-07 ENCOUNTER — Telehealth: Payer: Self-pay | Admitting: *Deleted

## 2012-01-07 ENCOUNTER — Encounter: Payer: Self-pay | Admitting: Vascular Surgery

## 2012-01-07 NOTE — Telephone Encounter (Signed)
Please call pt back to help her understand when her next appt should be for a CPX.

## 2012-01-08 ENCOUNTER — Encounter: Payer: Self-pay | Admitting: Vascular Surgery

## 2012-01-08 ENCOUNTER — Ambulatory Visit (INDEPENDENT_AMBULATORY_CARE_PROVIDER_SITE_OTHER): Payer: Medicare Other | Admitting: Vascular Surgery

## 2012-01-08 ENCOUNTER — Other Ambulatory Visit: Payer: Self-pay | Admitting: Internal Medicine

## 2012-01-08 VITALS — BP 152/87 | HR 90 | Resp 18 | Ht 66.0 in | Wt 270.0 lb

## 2012-01-08 DIAGNOSIS — I83893 Varicose veins of bilateral lower extremities with other complications: Secondary | ICD-10-CM

## 2012-01-08 NOTE — Progress Notes (Signed)
Problems with Activities of Daily Living Secondary to Leg Pain  1. Mrs. [name] states that activities that require prolonged standing such as cooking, shopping, and cleaning are very difficult for her due to leg pain.  2. [name] states that traveling in the car is very difficult for her due to leg pain.  Rankin, Neena Rhymes   Failure of  Conservative Therapy:  1. Worn 20-30 mm Hg thigh high compression hose >3 months with no relief of symptoms.  2. Frequently elevates legs-no relief of symptoms  3. Taken Ibuprofen 600 Mg TID with no relief of symptoms.  I reviewed the patient's noninvasive studies. This reveals significant reflux in her left deep system. She does not have any significant reflux in the saphenous veins bilaterally. There is some reflux at the saphenofemoral junction only on the left. I do not feel that there is any role for ablation due to this. I did explain the need for elevation when possible. I explained that this has to be legs higher than her heart. She is morbidly obese and has not been able to tolerate compression which is expected. I did explain the option of simply wrapping her foot ankle and calf with an Ace wrap to help keep the swelling out. I did explain the chronic progressive nature of venous hypertension. She understands and will see [place] on an as-needed basis

## 2012-01-21 ENCOUNTER — Other Ambulatory Visit (HOSPITAL_COMMUNITY)
Admission: RE | Admit: 2012-01-21 | Discharge: 2012-01-21 | Disposition: A | Discharge status: Home / Self Care | Payer: Medicare Other | Source: Ambulatory Visit | Attending: Internal Medicine | Admitting: Internal Medicine

## 2012-01-21 ENCOUNTER — Ambulatory Visit (INDEPENDENT_AMBULATORY_CARE_PROVIDER_SITE_OTHER): Payer: Medicare Other | Admitting: Internal Medicine

## 2012-01-21 ENCOUNTER — Encounter: Payer: Self-pay | Admitting: Internal Medicine

## 2012-01-21 VITALS — BP 130/88 | HR 95 | Temp 98.8°F | Ht 66.0 in | Wt 276.0 lb

## 2012-01-21 DIAGNOSIS — Z01419 Encounter for gynecological examination (general) (routine) without abnormal findings: Secondary | ICD-10-CM

## 2012-01-21 DIAGNOSIS — F329 Major depressive disorder, single episode, unspecified: Secondary | ICD-10-CM

## 2012-01-21 DIAGNOSIS — E669 Obesity, unspecified: Secondary | ICD-10-CM

## 2012-01-21 DIAGNOSIS — Z1159 Encounter for screening for other viral diseases: Secondary | ICD-10-CM | POA: Insufficient documentation

## 2012-01-21 DIAGNOSIS — Z124 Encounter for screening for malignant neoplasm of cervix: Secondary | ICD-10-CM | POA: Insufficient documentation

## 2012-01-21 DIAGNOSIS — Z860100 Personal history of colon polyps, unspecified: Secondary | ICD-10-CM

## 2012-01-21 DIAGNOSIS — M8580 Other specified disorders of bone density and structure, unspecified site: Secondary | ICD-10-CM

## 2012-01-21 DIAGNOSIS — I10 Essential (primary) hypertension: Secondary | ICD-10-CM

## 2012-01-21 DIAGNOSIS — E785 Hyperlipidemia, unspecified: Secondary | ICD-10-CM

## 2012-01-21 DIAGNOSIS — Z0000 Encounter for general adult medical examination without abnormal findings: Secondary | ICD-10-CM

## 2012-01-21 DIAGNOSIS — F3289 Other specified depressive episodes: Secondary | ICD-10-CM

## 2012-01-21 DIAGNOSIS — M1990 Unspecified osteoarthritis, unspecified site: Secondary | ICD-10-CM

## 2012-01-21 DIAGNOSIS — R7309 Other abnormal glucose: Secondary | ICD-10-CM

## 2012-01-21 DIAGNOSIS — I83893 Varicose veins of bilateral lower extremities with other complications: Secondary | ICD-10-CM

## 2012-01-21 LAB — CBC WITH DIFFERENTIAL/PLATELET
Basophils Absolute: 0 10*3/uL (ref 0.0–0.1)
Eosinophils Relative: 3.9 % (ref 0.0–5.0)
HCT: 35.6 % — ABNORMAL LOW (ref 36.0–46.0)
Lymphs Abs: 1.5 10*3/uL (ref 0.7–4.0)
MCV: 98.4 fl (ref 78.0–100.0)
Monocytes Absolute: 0.6 10*3/uL (ref 0.1–1.0)
Platelets: 230 10*3/uL (ref 150.0–400.0)
RDW: 14 % (ref 11.5–14.6)

## 2012-01-21 LAB — BASIC METABOLIC PANEL
BUN: 18 mg/dL (ref 6–23)
Chloride: 99 mEq/L (ref 96–112)
Glucose, Bld: 91 mg/dL (ref 70–99)
Potassium: 3.9 mEq/L (ref 3.5–5.1)

## 2012-01-21 LAB — TSH: TSH: 4.53 u[IU]/mL (ref 0.35–5.50)

## 2012-01-21 LAB — HEPATIC FUNCTION PANEL
ALT: 17 U/L (ref 0–35)
Albumin: 3.9 g/dL (ref 3.5–5.2)
Total Bilirubin: 0.3 mg/dL (ref 0.3–1.2)

## 2012-01-21 LAB — LIPID PANEL
Cholesterol: 129 mg/dL (ref 0–200)
VLDL: 11.2 mg/dL (ref 0.0–40.0)

## 2012-01-21 NOTE — Patient Instructions (Signed)
Will notify you  of labs when available.  We'll plan on sending you a copy of the labs. Discuss weight loss and depression needing with your counselor.  Try to break the association. Activities with her hands are helpful.  Followup visit in 6 months or depending on labs  We can do Pap smears every 3-5 years depending on results.
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
## 2012-01-22 LAB — VITAMIN D 25 HYDROXY (VIT D DEFICIENCY, FRACTURES): Vit D, 25-Hydroxy: 41 ng/mL (ref 30–89)

## 2012-01-24 NOTE — Progress Notes (Signed)
Quick Note:  Spoke with pt and pt is aware. ______ 

## 2012-01-25 ENCOUNTER — Other Ambulatory Visit: Payer: Self-pay | Admitting: Internal Medicine

## 2012-01-25 ENCOUNTER — Other Ambulatory Visit: Payer: Self-pay

## 2012-01-25 DIAGNOSIS — D649 Anemia, unspecified: Secondary | ICD-10-CM

## 2012-01-25 DIAGNOSIS — I10 Essential (primary) hypertension: Secondary | ICD-10-CM

## 2012-01-25 NOTE — Telephone Encounter (Signed)
Message left on Triage voicemail: Patient needs rx for shingles vaccine. Patient would like to pick this up. Please call when ready

## 2012-01-26 NOTE — Progress Notes (Signed)
Quick Note:  Tell patient PAP is normal. And high risk HPV is negative. Can go to every 5 year pap if no other sx . ______

## 2012-01-28 NOTE — Telephone Encounter (Signed)
Pls advise.  

## 2012-01-29 NOTE — Telephone Encounter (Signed)
Please send  rx for shingle vaccine for patient. [name]

## 2012-01-30 MED ORDER — ZOSTER VACCINE LIVE 19400 UNT/0.65ML ~~LOC~~ SOLR: 0.6500 mL | Freq: Once | SUBCUTANEOUS | Status: DC

## 2012-01-30 MED ORDER — ZOSTER VACCINE LIVE 19400 UNT/0.65ML ~~LOC~~ SOLR: 0.6500 mL | Freq: Once | SUBCUTANEOUS | Status: AC

## 2012-01-30 NOTE — Telephone Encounter (Signed)
Called and spoke with pt to make aware rx ready for pick up.  

## 2012-01-30 NOTE — Progress Notes (Signed)
Quick Note:    Letter sent to patient.  ______

## 2012-02-09 ENCOUNTER — Other Ambulatory Visit: Payer: Self-pay | Admitting: Internal Medicine

## 2012-02-14 ENCOUNTER — Other Ambulatory Visit: Payer: Self-pay | Admitting: Internal Medicine

## 2012-03-06 ENCOUNTER — Other Ambulatory Visit: Payer: Self-pay | Admitting: Internal Medicine

## 2012-03-07 ENCOUNTER — Other Ambulatory Visit: Payer: Self-pay | Admitting: Family Medicine

## 2012-03-07 MED ORDER — POTASSIUM CHLORIDE CRYS ER 20 MEQ PO TBCR: 20.0000 meq | EXTENDED_RELEASE_TABLET | Freq: Every day | ORAL | Status: DC

## 2012-03-07 NOTE — Telephone Encounter (Signed)
Refilled until next May which will be 1 year.  Last seen 01/21/12 and has a f/u on 07/28/12.  Last BMP was 01/21/12/.

## 2012-03-10 ENCOUNTER — Other Ambulatory Visit: Payer: Self-pay | Admitting: Internal Medicine

## 2012-04-18 ENCOUNTER — Other Ambulatory Visit: Payer: Self-pay | Admitting: Internal Medicine

## 2012-05-15 ENCOUNTER — Telehealth: Payer: Self-pay | Admitting: Internal Medicine

## 2012-05-15 NOTE — Telephone Encounter (Signed)
Make sure med list here is updated and accurate. NO change in meds yet.  Need to get BP readings( large cuff 3 x per week x 2 weeks and call these readings in for me to review before adjusting meds.  If very high 160 and over  then have her make an earlier ROV to evaluate.

## 2012-05-15 NOTE — Telephone Encounter (Signed)
Case Manager currently at patient's home and the patient has a BP reading of 145/101.  This is higher than it normally runs.  The pt's BP usually runs in the 130's over 80's.  Pt denies having any symptoms related to elevated BP.  Her next scheduled appt is not until November, please advise if any adjustments need to be made for patient.

## 2012-05-16 NOTE — Telephone Encounter (Signed)
Left message for the pt to return my call. 

## 2012-05-19 NOTE — Telephone Encounter (Signed)
Left message for the pt to return my call. 

## 2012-05-20 ENCOUNTER — Encounter: Payer: Self-pay | Admitting: Internal Medicine

## 2012-05-20 ENCOUNTER — Telehealth: Payer: Self-pay | Admitting: Internal Medicine

## 2012-05-20 NOTE — Telephone Encounter (Signed)
Left message for the pt to return my call.  Will now send out a contact letter.

## 2012-05-20 NOTE — Telephone Encounter (Signed)
Caller: Carrieann/Patient; Phone: [phone]; Reason for Call: Patient is returning a call from office regarding her blood pressure.  Please call back.

## 2012-05-23 NOTE — Telephone Encounter (Signed)
Pt called and said that she is returning a call from nurse re: letter she rcvd re: instructions for high bp. Pls call.

## 2012-05-23 NOTE — Telephone Encounter (Signed)
Left message for the pt to return my call. 

## 2012-05-26 NOTE — Telephone Encounter (Signed)
Instructed to call back if any further questions.

## 2012-05-26 NOTE — Telephone Encounter (Signed)
Called and left message on personal cell explaining the letter.

## 2012-06-13 ENCOUNTER — Other Ambulatory Visit: Payer: Self-pay | Admitting: Internal Medicine

## 2012-07-14 ENCOUNTER — Other Ambulatory Visit: Payer: Self-pay | Admitting: Internal Medicine

## 2012-07-21 ENCOUNTER — Other Ambulatory Visit (INDEPENDENT_AMBULATORY_CARE_PROVIDER_SITE_OTHER): Payer: Medicare Other

## 2012-07-21 DIAGNOSIS — D649 Anemia, unspecified: Secondary | ICD-10-CM

## 2012-07-21 DIAGNOSIS — I10 Essential (primary) hypertension: Secondary | ICD-10-CM

## 2012-07-21 LAB — CBC WITH DIFFERENTIAL/PLATELET
Basophils Absolute: 0 10*3/uL (ref 0.0–0.1)
Eosinophils Absolute: 0.1 10*3/uL (ref 0.0–0.7)
Lymphocytes Relative: 35.1 % (ref 12.0–46.0)
MCHC: 32.5 g/dL (ref 30.0–36.0)
MCV: 98.8 fl (ref 78.0–100.0)
Monocytes Absolute: 0.5 10*3/uL (ref 0.1–1.0)
Neutro Abs: 1.9 10*3/uL (ref 1.4–7.7)
Neutrophils Relative %: 48.4 % (ref 43.0–77.0)
RDW: 13.6 % (ref 11.5–14.6)

## 2012-07-21 LAB — BASIC METABOLIC PANEL
CO2: 30 mEq/L (ref 19–32)
Calcium: 9.3 mg/dL (ref 8.4–10.5)
Creatinine, Ser: 1.5 mg/dL — ABNORMAL HIGH (ref 0.4–1.2)
Glucose, Bld: 110 mg/dL — ABNORMAL HIGH (ref 70–99)

## 2012-07-21 LAB — IBC PANEL: Saturation Ratios: 30.4 % (ref 20.0–50.0)

## 2012-07-28 ENCOUNTER — Encounter: Payer: Self-pay | Admitting: Internal Medicine

## 2012-07-28 ENCOUNTER — Ambulatory Visit (INDEPENDENT_AMBULATORY_CARE_PROVIDER_SITE_OTHER): Payer: Medicare Other | Admitting: Internal Medicine

## 2012-07-28 ENCOUNTER — Telehealth: Payer: Self-pay | Admitting: Internal Medicine

## 2012-07-28 VITALS — BP 132/80 | HR 98 | Temp 98.2°F | Wt 285.0 lb

## 2012-07-28 DIAGNOSIS — I10 Essential (primary) hypertension: Secondary | ICD-10-CM

## 2012-07-28 DIAGNOSIS — Z23 Encounter for immunization: Secondary | ICD-10-CM

## 2012-07-28 DIAGNOSIS — Z9181 History of falling: Secondary | ICD-10-CM

## 2012-07-28 DIAGNOSIS — N289 Disorder of kidney and ureter, unspecified: Secondary | ICD-10-CM | POA: Insufficient documentation

## 2012-07-28 DIAGNOSIS — R296 Repeated falls: Secondary | ICD-10-CM

## 2012-07-28 DIAGNOSIS — E669 Obesity, unspecified: Secondary | ICD-10-CM

## 2012-07-28 DIAGNOSIS — D649 Anemia, unspecified: Secondary | ICD-10-CM

## 2012-07-28 DIAGNOSIS — M1990 Unspecified osteoarthritis, unspecified site: Secondary | ICD-10-CM

## 2012-07-28 MED ORDER — ESOMEPRAZOLE MAGNESIUM 40 MG PO CPDR: 40.0000 mg | DELAYED_RELEASE_CAPSULE | Freq: Every day | ORAL | Status: DC

## 2012-07-28 NOTE — Telephone Encounter (Signed)
Sent to the pharmacy by e-scribe. 

## 2012-07-28 NOTE — Patient Instructions (Signed)
Will arrange for neurology to see you about recurrent falling.  May benefit from physical therapy. The anemia could be from  A couple of things  But your iron level is ok. iron problem could be from kidney dysfunction.  We need to make sure your B12 level is ok also.  Weight gain can  Add to your problems decrease snacking  And portion size.   Labs in 1 month and then plan next step.
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
## 2012-07-28 NOTE — Telephone Encounter (Signed)
Pt called and said that she forgot to ask for refill of NEXIUM 40 MG capsuleCVS on [address].

## 2012-07-30 DIAGNOSIS — R296 Repeated falls: Secondary | ICD-10-CM | POA: Insufficient documentation

## 2012-08-18 ENCOUNTER — Other Ambulatory Visit: Payer: Self-pay | Admitting: Diagnostic Neuroimaging

## 2012-08-18 DIAGNOSIS — R269 Unspecified abnormalities of gait and mobility: Secondary | ICD-10-CM

## 2012-08-18 DIAGNOSIS — M545 Low back pain: Secondary | ICD-10-CM

## 2012-08-22 ENCOUNTER — Ambulatory Visit
Admission: RE | Admit: 2012-08-22 | Discharge: 2012-08-22 | Disposition: A | Discharge status: Home / Self Care | Payer: Medicare Other | Source: Ambulatory Visit | Attending: Diagnostic Neuroimaging | Admitting: Diagnostic Neuroimaging

## 2012-08-22 DIAGNOSIS — M545 Low back pain: Secondary | ICD-10-CM

## 2012-08-22 DIAGNOSIS — R269 Unspecified abnormalities of gait and mobility: Secondary | ICD-10-CM

## 2012-08-27 ENCOUNTER — Other Ambulatory Visit (INDEPENDENT_AMBULATORY_CARE_PROVIDER_SITE_OTHER): Payer: Medicare Other

## 2012-08-27 DIAGNOSIS — E538 Deficiency of other specified B group vitamins: Secondary | ICD-10-CM

## 2012-08-27 DIAGNOSIS — IMO0002 Reserved for concepts with insufficient information to code with codable children: Secondary | ICD-10-CM

## 2012-08-27 DIAGNOSIS — I10 Essential (primary) hypertension: Secondary | ICD-10-CM

## 2012-08-27 DIAGNOSIS — D649 Anemia, unspecified: Secondary | ICD-10-CM

## 2012-08-27 LAB — CBC WITH DIFFERENTIAL/PLATELET
Basophils Relative: 0.5 % (ref 0.0–3.0)
Eosinophils Relative: 2.3 % (ref 0.0–5.0)
HCT: 34.9 % — ABNORMAL LOW (ref 36.0–46.0)
Monocytes Relative: 10.8 % (ref 3.0–12.0)
Neutrophils Relative %: 43.1 % (ref 43.0–77.0)
Platelets: 236 10*3/uL (ref 150.0–400.0)
RBC: 3.63 Mil/uL — ABNORMAL LOW (ref 3.87–5.11)
WBC: 4 10*3/uL — ABNORMAL LOW (ref 4.5–10.5)

## 2012-08-27 LAB — FOLATE: Folate: 14.5 ng/mL (ref >5.9)

## 2012-08-27 LAB — BASIC METABOLIC PANEL
CO2: 25 mEq/L (ref 19–32)
Calcium: 9.5 mg/dL (ref 8.4–10.5)
GFR: 50.33 mL/min — ABNORMAL LOW (ref >60.00)
Potassium: 4 mEq/L (ref 3.5–5.1)
Sodium: 139 mEq/L (ref 135–145)

## 2012-08-27 LAB — IRON: Iron: 59 ug/dL (ref 42–145)

## 2012-09-06 ENCOUNTER — Other Ambulatory Visit: Payer: Self-pay | Admitting: Internal Medicine

## 2012-09-15 ENCOUNTER — Other Ambulatory Visit: Payer: Self-pay | Admitting: Family Medicine

## 2012-09-15 ENCOUNTER — Telehealth: Payer: Self-pay | Admitting: Oncology

## 2012-09-15 DIAGNOSIS — D649 Anemia, unspecified: Secondary | ICD-10-CM

## 2012-09-15 NOTE — Telephone Encounter (Signed)
S/W pt in re NP appt 01/28 @ 10:30 w/Dr. Clelia Croft.  Referring Dr. Fabian Sharp Dx-Anemia Welcome packet mailed.

## 2012-09-16 ENCOUNTER — Telehealth: Payer: Self-pay | Admitting: Oncology

## 2012-09-16 NOTE — Telephone Encounter (Signed)
C/D 09/16/12 for appt.10/07/12

## 2012-09-24 ENCOUNTER — Ambulatory Visit (INDEPENDENT_AMBULATORY_CARE_PROVIDER_SITE_OTHER): Payer: Medicare Other | Admitting: Internal Medicine

## 2012-09-24 ENCOUNTER — Encounter: Payer: Self-pay | Admitting: Internal Medicine

## 2012-09-24 VITALS — BP 120/80 | HR 97 | Temp 98.5°F | Wt 262.0 lb

## 2012-09-24 DIAGNOSIS — E669 Obesity, unspecified: Secondary | ICD-10-CM

## 2012-09-24 DIAGNOSIS — R7309 Other abnormal glucose: Secondary | ICD-10-CM

## 2012-09-24 DIAGNOSIS — M47816 Spondylosis without myelopathy or radiculopathy, lumbar region: Secondary | ICD-10-CM | POA: Insufficient documentation

## 2012-09-24 DIAGNOSIS — M47817 Spondylosis without myelopathy or radiculopathy, lumbosacral region: Secondary | ICD-10-CM

## 2012-09-24 DIAGNOSIS — Z9181 History of falling: Secondary | ICD-10-CM

## 2012-09-24 DIAGNOSIS — I10 Essential (primary) hypertension: Secondary | ICD-10-CM

## 2012-09-24 DIAGNOSIS — R946 Abnormal results of thyroid function studies: Secondary | ICD-10-CM

## 2012-09-24 DIAGNOSIS — R296 Repeated falls: Secondary | ICD-10-CM

## 2012-09-24 LAB — T3, FREE: T3, Free: 2.4 pg/mL (ref 2.3–4.2)

## 2012-09-24 LAB — TSH: TSH: 3.86 u[IU]/mL (ref 0.35–5.50)
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
## 2012-09-24 NOTE — Patient Instructions (Signed)
Will notify you  of labs when available.  And then we may add medication for thyroid problem . An plan monitor testing every few months . Hypothyroidism The thyroid is a large gland located in the lower front of your neck. The thyroid gland helps control metabolism. Metabolism is how your body handles food. It controls metabolism with the hormone thyroxine. When this gland is underactive (hypothyroid), it produces too little hormone.  CAUSES These include:   Absence or destruction of thyroid tissue.  Goiter due to iodine deficiency.  Goiter due to medications.  Congenital defects (since birth).  Problems with the pituitary. This causes a lack of TSH (thyroid stimulating hormone). This hormone tells the thyroid to turn out more hormone. SYMPTOMS  Lethargy (feeling as though you have no energy)  Cold intolerance  Weight gain (in spite of normal food intake)  Dry skin  Coarse hair  Menstrual irregularity (if severe, may lead to infertility)  Slowing of thought processes Cardiac problems are also caused by insufficient amounts of thyroid hormone. Hypothyroidism in the newborn is cretinism, and is an extreme form. It is important that this form be treated adequately and immediately or it will lead rapidly to retarded physical and mental development. DIAGNOSIS  To prove hypothyroidism, your caregiver may do blood tests and ultrasound tests. Sometimes the signs are hidden. It may be necessary for your caregiver to watch this illness with blood tests either before or after diagnosis and treatment. TREATMENT  Low levels of thyroid hormone are increased by using synthetic thyroid hormone. This is a safe, effective treatment. It usually takes about four weeks to gain the full effects of the medication. After you have the full effect of the medication, it will generally take another four weeks for problems to leave. Your caregiver may start you on low doses. If you have had heart problems the  dose may be gradually increased. It is generally not an emergency to get rapidly to normal. HOME CARE INSTRUCTIONS   Take your medications as your caregiver suggests. Let your caregiver know of any medications you are taking or start taking. Your caregiver will help you with dosage schedules.  As your condition improves, your dosage needs may increase. It will be necessary to have continuing blood tests as suggested by your caregiver.  Report all suspected medication side effects to your caregiver. SEEK MEDICAL CARE IF: Seek medical care if you develop:  Sweating.  Tremulousness (tremors).  Anxiety.  Rapid weight loss.  Heat intolerance.  Emotional swings.  Diarrhea.  Weakness. SEEK IMMEDIATE MEDICAL CARE IF:  You develop chest pain, an irregular heart beat (palpitations), or a rapid heart beat. MAKE SURE YOU:   Understand these instructions.  Will watch your condition.  Will get help right away if you are not doing well or get worse. Document Released: 08/27/2005 Document Revised: 11/19/2011 Document Reviewed: 04/16/2008 [facility] Patient Information 2013 [place], [place].

## 2012-09-25 LAB — THYROID ANTIBODIES
Thyroglobulin Ab: <20 U/mL (ref <40.0)
Thyroperoxidase Ab SerPl-aCnc: <10 IU/mL (ref <35.0)

## 2012-09-26 ENCOUNTER — Other Ambulatory Visit: Payer: Self-pay | Admitting: Internal Medicine

## 2012-09-26 MED ORDER — LEVOTHYROXINE SODIUM 50 MCG PO TABS: 50.0000 ug | ORAL_TABLET | Freq: Every day | ORAL | Status: DC

## 2012-10-03 ENCOUNTER — Other Ambulatory Visit: Payer: Self-pay | Admitting: Oncology

## 2012-10-03 DIAGNOSIS — D649 Anemia, unspecified: Secondary | ICD-10-CM

## 2012-10-07 ENCOUNTER — Other Ambulatory Visit (HOSPITAL_BASED_OUTPATIENT_CLINIC_OR_DEPARTMENT_OTHER): Payer: Medicare Other | Admitting: Lab

## 2012-10-07 ENCOUNTER — Telehealth: Payer: Self-pay | Admitting: Oncology

## 2012-10-07 ENCOUNTER — Ambulatory Visit: Payer: Medicare Other

## 2012-10-07 ENCOUNTER — Ambulatory Visit: Payer: Medicare Other | Admitting: Lab

## 2012-10-07 ENCOUNTER — Ambulatory Visit (HOSPITAL_BASED_OUTPATIENT_CLINIC_OR_DEPARTMENT_OTHER): Payer: Medicare Other | Admitting: Oncology

## 2012-10-07 ENCOUNTER — Encounter: Payer: Self-pay | Admitting: Oncology

## 2012-10-07 VITALS — BP 174/95 | HR 85 | Temp 96.9°F | Resp 18 | Ht 66.0 in | Wt 280.0 lb

## 2012-10-07 DIAGNOSIS — D649 Anemia, unspecified: Secondary | ICD-10-CM

## 2012-10-07 LAB — CBC WITH DIFFERENTIAL/PLATELET
BASO%: 0.8 % (ref 0.0–2.0)
Eosinophils Absolute: 0.1 10*3/uL (ref 0.0–0.5)
LYMPH%: 29.7 % (ref 14.0–49.7)
MCHC: 33.7 g/dL (ref 31.5–36.0)
MCV: 96 fL (ref 79.5–101.0)
MONO%: 11.3 % (ref 0.0–14.0)
NEUT#: 2.1 10*3/uL (ref 1.5–6.5)
Platelets: 236 10*3/uL (ref 145–400)
RBC: 3.7 10*6/uL (ref 3.70–5.45)
RDW: 13.5 % (ref 11.2–14.5)
WBC: 3.9 10*3/uL (ref 3.9–10.3)

## 2012-10-07 LAB — COMPREHENSIVE METABOLIC PANEL (CC13)
ALT: 16 U/L (ref 0–55)
AST: 14 U/L (ref 5–34)
Alkaline Phosphatase: 122 U/L (ref 40–150)
BUN: 19.7 mg/dL (ref 7.0–26.0)
Chloride: 105 mEq/L (ref 98–107)
Creatinine: 1.5 mg/dL — ABNORMAL HIGH (ref 0.6–1.1)

## 2012-10-07 NOTE — Progress Notes (Signed)
Note dictated

## 2012-10-07 NOTE — Progress Notes (Signed)
Checked in new patient. No financial issues. °

## 2012-10-07 NOTE — Telephone Encounter (Signed)
appts made and printed for pt  °

## 2012-10-07 NOTE — Progress Notes (Signed)
CC:   Neta Mends. Panosh, MD  REASON FOR CONSULTATION:  Anemia.  HISTORY OF PRESENT ILLNESS:  This is a pleasant 62 year old woman currently of [place], currently resides alone and has retired from working as a Agricultural engineer.  Currently again retired, lives alone and gets her routine medical care under the care of Dr. Fabian Sharp.  She has a past medical history significant for hypertension, obesity, thyroid disease and chronic renal insufficiency.  She has being getting routine blood counts under Dr. Rosezella [place] care and she was noted to be mildly anemic.  Her hemoglobin on December 18 showed it was 11.5, upper limit of normal was 12.  Her white cell count was 4000, platelet count was 236.  That hemoglobin has ranged as low as 11.1 in November of 2013 and as high as 11.9 back in April of 2012 and seems to have fluctuated.  She did require a transfusion after her knee operation back in 2011.  In looking back at her records as far back as 2010, she had a hemoglobin around 11.7.  She is asymptomatic specifically from this.  She had not reported any chest pain, not reported any shortness of breath.  She does report some chronic back pain probably due to degenerative arthritis. She had an MRI for that.  Otherwise she is able to function moderately, able to attend to activities of daily living without any hindrance or decline.  REVIEW OF SYSTEMS:  Not reporting any headaches, blurry vision, double vision.  Not reporting any motor or sensory neuropathy.  Not reporting any ulceration.  Mental status not reporting any psychiatric issues, depression.  Not reporting any fever, chills, sweats.  Not reporting any cough, hemoptysis, hematemesis.  No nausea, vomiting.  No abdominal pain, hematochezia or melena, genitourinary complaints.  Rest of review of systems is unremarkable.  PAST MEDICAL HISTORY:  Significant for hypertension, history of depression, degenerative arthritis, obesity,  hyperlipidemia.  MEDICATIONS: 1. She is on Wellbutrin. 2. Vitamin D. 3. Crestor. 4. Colace. 5. Lexapro. 6. Lamictal. 7. Micardis. 8. Potassium. 9. Seroquel. 10.Ultram.  ALLERGIES:  To aspirin.  SOCIAL HISTORY:  She is single.  She has 3 children.  Denied any alcohol or tobacco abuse.  FAMILY HISTORY:  Her mother had lung cancer.  Sister had colon cancer but no history of hemoglobinopathy that she can recall.  PHYSICAL EXAMINATION:  General:  Alert, awake, pleasant woman, appeared in no active distress.  Vital signs:  Blood pressure is 174/95, pulse is 85, respiration 18, temperature is 96.9.  HEENT:  Head normocephalic, atraumatic.  Pupils equal, round, reactive to light.  Oral mucosa moist and pink.  Neck:  Supple without adenopathy.  Heart:  Regular rate and rhythm.  S1, S2.  Lungs:  Clear to auscultation.  Abdomen:  Soft, nontender.  No hepatosplenomegaly.  Extremities:  No edema.  LABORATORY:  Data from today are pending.  ASSESSMENT AND PLAN:  A 62 year old woman with normocytic normochromic anemia.  The differential diagnosis discussed today with Ms. Brining and her daughter that includes likely anemia of chronic disease, could also have anemia of renal disease.  Her creatinine is around 1.4 with a GFR around 45-50 cc/minute.  She has a lot of chronic conditions that could contribute to myelosuppression such as thyroid disease, hypertension, as well as mentioned chronic renal insufficiency.  Other conditions such as a vitamin deficiency, B12 and folic acid, these have been checked.  I doubt that there is an element of plasma cell disorder here at this point  but certainly something that we need to investigate.  Her hemoglobin is relatively mild, is asymptomatic; I do not think an intervention is needed but definitely I will check her erythropoietin level as well as a serum protein electrophoresis to rule out a plasma cell disorder.  I will ask her for a followup in  about 3 months. Recheck her hemoglobin.  If it continues to be stable I do not think any further intervention or workup is needed at this time.  All their questions were answered today.    ______________________________ Benjiman Core, M.D. FNS/MEDQ  D:  10/07/2012  T:  10/07/2012  Job:  [id number]

## 2012-11-07 ENCOUNTER — Encounter: Payer: Self-pay | Admitting: Diagnostic Neuroimaging

## 2012-11-07 DIAGNOSIS — G250 Essential tremor: Secondary | ICD-10-CM

## 2012-11-07 DIAGNOSIS — M545 Low back pain: Secondary | ICD-10-CM

## 2012-11-07 DIAGNOSIS — R269 Unspecified abnormalities of gait and mobility: Secondary | ICD-10-CM

## 2012-11-17 ENCOUNTER — Other Ambulatory Visit (INDEPENDENT_AMBULATORY_CARE_PROVIDER_SITE_OTHER): Payer: Medicare Other

## 2012-11-17 DIAGNOSIS — R946 Abnormal results of thyroid function studies: Secondary | ICD-10-CM

## 2012-11-17 LAB — TSH: TSH: 4.36 u[IU]/mL (ref 0.35–5.50)

## 2012-11-25 ENCOUNTER — Other Ambulatory Visit: Payer: Self-pay | Admitting: Family Medicine

## 2012-11-25 DIAGNOSIS — R946 Abnormal results of thyroid function studies: Secondary | ICD-10-CM

## 2012-11-25 DIAGNOSIS — R7989 Other specified abnormal findings of blood chemistry: Secondary | ICD-10-CM

## 2012-11-25 MED ORDER — LEVOTHYROXINE SODIUM 75 MCG PO TABS: 75.0000 ug | ORAL_TABLET | Freq: Every day | ORAL | Status: DC

## 2012-11-28 ENCOUNTER — Other Ambulatory Visit: Payer: Self-pay | Admitting: Internal Medicine

## 2012-12-08 ENCOUNTER — Encounter: Payer: Self-pay | Admitting: Diagnostic Neuroimaging

## 2012-12-08 ENCOUNTER — Ambulatory Visit (INDEPENDENT_AMBULATORY_CARE_PROVIDER_SITE_OTHER): Payer: Medicare Other | Admitting: Diagnostic Neuroimaging

## 2012-12-08 VITALS — BP 131/88 | HR 92 | Ht 66.0 in | Wt 274.0 lb

## 2012-12-08 DIAGNOSIS — M545 Low back pain: Secondary | ICD-10-CM

## 2012-12-08 DIAGNOSIS — R269 Unspecified abnormalities of gait and mobility: Secondary | ICD-10-CM
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
## 2012-12-08 NOTE — Patient Instructions (Signed)
Continue PT exercises at home. Consider water aerobics therapy.  F/u with neurosurgery re: spinal stenosis.

## 2012-12-29 ENCOUNTER — Ambulatory Visit (INDEPENDENT_AMBULATORY_CARE_PROVIDER_SITE_OTHER): Payer: Medicare Other | Admitting: Internal Medicine

## 2012-12-29 ENCOUNTER — Encounter: Payer: Self-pay | Admitting: Internal Medicine

## 2012-12-29 VITALS — BP 128/80 | HR 94 | Temp 98.5°F | Wt 283.0 lb

## 2012-12-29 DIAGNOSIS — E039 Hypothyroidism, unspecified: Secondary | ICD-10-CM

## 2012-12-29 DIAGNOSIS — M48061 Spinal stenosis, lumbar region without neurogenic claudication: Secondary | ICD-10-CM

## 2012-12-29 DIAGNOSIS — D649 Anemia, unspecified: Secondary | ICD-10-CM

## 2012-12-29 DIAGNOSIS — W19XXXS Unspecified fall, sequela: Secondary | ICD-10-CM

## 2012-12-29 DIAGNOSIS — I10 Essential (primary) hypertension: Secondary | ICD-10-CM

## 2012-12-29 NOTE — Patient Instructions (Signed)
We can  Do a PT  referral for fall prevention  Advice but   You need to check inbtor other living arrangements to be able to get help if you fall and cant get  Up.   We need to check your thyroid level in about  3-[redacted] weeks along   with chemistry  We can send a note to the neuro  surgeon at that time .
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
## 2013-01-04 DIAGNOSIS — M48061 Spinal stenosis, lumbar region without neurogenic claudication: Secondary | ICD-10-CM | POA: Insufficient documentation

## 2013-01-04 DIAGNOSIS — E039 Hypothyroidism, unspecified: Secondary | ICD-10-CM | POA: Insufficient documentation

## 2013-01-04 DIAGNOSIS — R296 Repeated falls: Secondary | ICD-10-CM | POA: Insufficient documentation

## 2013-01-06 ENCOUNTER — Other Ambulatory Visit: Payer: Self-pay | Admitting: Internal Medicine

## 2013-01-06 ENCOUNTER — Other Ambulatory Visit (HOSPITAL_BASED_OUTPATIENT_CLINIC_OR_DEPARTMENT_OTHER): Payer: Medicare Other | Admitting: Lab

## 2013-01-06 ENCOUNTER — Ambulatory Visit (HOSPITAL_BASED_OUTPATIENT_CLINIC_OR_DEPARTMENT_OTHER): Payer: Medicare Other | Admitting: Oncology

## 2013-01-06 VITALS — BP 160/84 | HR 103 | Temp 98.1°F | Resp 20 | Ht 66.0 in | Wt 275.2 lb

## 2013-01-06 DIAGNOSIS — D539 Nutritional anemia, unspecified: Secondary | ICD-10-CM

## 2013-01-06 DIAGNOSIS — D649 Anemia, unspecified: Secondary | ICD-10-CM

## 2013-01-06 LAB — CBC WITH DIFFERENTIAL/PLATELET
BASO%: 0.7 % (ref 0.0–2.0)
Basophils Absolute: 0 10*3/uL (ref 0.0–0.1)
Eosinophils Absolute: 0.2 10*3/uL (ref 0.0–0.5)
HCT: 34.5 % — ABNORMAL LOW (ref 34.8–46.6)
HGB: 11.5 g/dL — ABNORMAL LOW (ref 11.6–15.9)
LYMPH%: 35.8 % (ref 14.0–49.7)
MCHC: 33.2 g/dL (ref 31.5–36.0)
MONO#: 0.6 10*3/uL (ref 0.1–0.9)
NEUT%: 46 % (ref 38.4–76.8)
Platelets: 258 10*3/uL (ref 145–400)
WBC: 4.3 10*3/uL (ref 3.9–10.3)

## 2013-01-06 NOTE — Progress Notes (Signed)
Hematology and Oncology Follow Up Visit  [name] [id number] [birth year] 62 y.o. 01/06/2013 10:42 AM   Principle Diagnosis: 62 year old with mild multifactorial anemia. Diagnosed in 09/2012.   Current therapy: Observation and follow up.   Interim History: [name] presents today for a follow up visit. She is a very nice women I saw on 09/2012 for the evaluation of mild normocytic, normochromic anemia. Her hgb ranged between 11.5 and 12 without any other abnormalities. Since her last visit, she is not reporting anything new. She reports no bleeding. She had some falls of late but she is adjusting to her gait abnormalities. Otherwise she is able to function moderately, able to attend to activities of daily living without any hindrance or decline.   Medications: I have reviewed the patient's current medications. Current outpatient prescriptions:buPROPion (WELLBUTRIN XL) 150 MG 24 hr tablet, Take 150 mg by mouth daily.  , Disp: , Rfl: ;  cholecalciferol (VITAMIN D) 1000 UNITS tablet, Take 1,000 Units by mouth daily., Disp: , Rfl: ;  CRESTOR 10 MG tablet, TAKE 1 TABLET AT BEDTIME, Disp: 30 tablet, Rfl: 11;  escitalopram (LEXAPRO) 10 MG tablet, Take 20 mg by mouth daily. , Disp: , Rfl:  esomeprazole (NEXIUM) 40 MG capsule, Take 1 capsule (40 mg total) by mouth daily., Disp: 30 capsule, Rfl: 5;  IRON PO, Take 325 mg by mouth daily., Disp: , Rfl: ;  lamoTRIgine (LAMICTAL) 200 MG tablet, Take 200 mg by mouth daily.  , Disp: , Rfl: ;  levothyroxine (SYNTHROID, LEVOTHROID) 75 MCG tablet, Take 1 tablet (75 mcg total) by mouth daily., Disp: 90 tablet, Rfl: 0 MICARDIS HCT 80-25 MG per tablet, TAKE 1 TABLET EVERY DAY, Disp: 30 tablet, Rfl: 3;  potassium chloride SA (KLOR-CON M20) 20 MEQ tablet, Take 1 tablet (20 mEq total) by mouth daily., Disp: 60 tablet, Rfl: 10;  QUEtiapine (SEROQUEL) 400 MG tablet, Take 800 mg by mouth at bedtime., Disp: , Rfl: ;  TELMISARTAN-HCTZ PO, , Disp: , Rfl: ;  traMADol (ULTRAM) 50  MG tablet, Take 50 mg by mouth 2 (two) times daily. , Disp: , Rfl:   Allergies:  Allergies  Allergen Reactions  . Aspirin     REACTION: rash    Past Medical History, Surgical history, Social history, and Family History were reviewed and updated.  Review of Systems: Constitutional:  Negative for fever, chills, night sweats, anorexia, weight loss, pain. Cardiovascular: negative Respiratory: negative Neurological: negative Dermatological: negative ENT: negative Skin: Negative. Gastrointestinal: negative Genito-Urinary: negative Hematological and Lymphatic: negative Breast: negative Musculoskeletal: negative Remaining ROS negative. Physical Exam: Blood pressure 160/84, pulse 103, temperature 98.1 F (36.7 C), temperature source Oral, resp. rate 20, height 5\' 6"  (1.676 m), weight 275 lb 3.2 oz (124.83 kg), SpO2 98.00%. ECOG: 1 General appearance: alert Head: Normocephalic, without obvious abnormality, atraumatic Neck: no adenopathy, no carotid bruit, no JVD, supple, symmetrical, trachea midline and thyroid not enlarged, symmetric, no tenderness/mass/nodules Lymph nodes: Cervical, supraclavicular, and axillary nodes normal. Heart:regular rate and rhythm, S1, S2 normal, no murmur, click, rub or gallop Lung:chest clear, no wheezing, rales, normal symmetric air entry Abdomin: soft, non-tender, without masses or organomegaly EXT:no erythema, induration, or nodules   Lab Results: Lab Results  Component Value Date   WBC 4.3 01/06/2013   HGB 11.5* 01/06/2013   HCT 34.5* 01/06/2013   MCV 97.3 01/06/2013   PLT 258 01/06/2013     Chemistry      Component Value Date/Time   NA 140 10/07/2012 1154  NA 139 08/27/2012 0913   K 3.9 10/07/2012 1154   K 4.0 08/27/2012 0913   CL 105 10/07/2012 1154   CL 106 08/27/2012 0913   CO2 27 10/07/2012 1154   CO2 25 08/27/2012 0913   BUN 19.7 10/07/2012 1154   BUN 22 08/27/2012 0913   CREATININE 1.5* 10/07/2012 1154   CREATININE 1.4* 08/27/2012 0913       Component Value Date/Time   CALCIUM 9.4 10/07/2012 1154   CALCIUM 9.5 08/27/2012 0913   ALKPHOS 122 10/07/2012 1154   ALKPHOS 111 01/21/2012 1035   AST 14 10/07/2012 1154   AST 18 01/21/2012 1035   ALT 16 10/07/2012 1154   ALT 17 01/21/2012 1035   BILITOT 0.39 10/07/2012 1154   BILITOT 0.3 01/21/2012 1035     Results for [name] (MRN [id number]) as of 01/06/2013 10:48  Ref. Range 10/07/2012 11:54  Erythropoietin Latest Range: 2.6-18.5 mIU/mL 19.7 (H)    Impression and Plan:  This is a 62 year old woman with normocytic normochromic anemia. The differential diagnosis includes anemia of chronic disease vs. anemia of renal disease. Her creatinine is around 1.4 with a GFR around 45-50 cc/minute.  She has a lot of chronic conditions that could contribute to myelosuppression such as thyroid disease, hypertension, as well as mentioned chronic renal insufficiency.  Her work up did not reveal any blood disorder or any vitamin deficiency. Her Hgb have ranged between 11.5 and 12 which close to being normal (at times it is).   She is asymptomatic from this. I do not think she needs further work up or follow up from hematology stand point, but I will be happy to see her in follow up in the future as needed.      [facility], MD 4/29/201410:42 AM

## 2013-01-08 ENCOUNTER — Other Ambulatory Visit: Payer: Self-pay | Admitting: Family Medicine

## 2013-01-08 MED ORDER — TELMISARTAN-HCTZ 80-25 MG PO TABS: ORAL_TABLET | ORAL | Status: DC

## 2013-01-09 ENCOUNTER — Ambulatory Visit: Payer: Medicare Other | Admitting: Internal Medicine

## 2013-01-12 ENCOUNTER — Other Ambulatory Visit: Payer: Self-pay | Admitting: Internal Medicine

## 2013-01-14 ENCOUNTER — Ambulatory Visit: Payer: Medicare Other | Attending: Internal Medicine | Admitting: Physical Therapy

## 2013-01-14 DIAGNOSIS — Z9181 History of falling: Secondary | ICD-10-CM | POA: Insufficient documentation

## 2013-01-14 DIAGNOSIS — R5381 Other malaise: Secondary | ICD-10-CM | POA: Insufficient documentation

## 2013-01-14 DIAGNOSIS — IMO0001 Reserved for inherently not codable concepts without codable children: Secondary | ICD-10-CM | POA: Insufficient documentation

## 2013-01-14 DIAGNOSIS — M545 Low back pain, unspecified: Secondary | ICD-10-CM | POA: Insufficient documentation

## 2013-01-16 ENCOUNTER — Encounter: Payer: Self-pay | Admitting: Internal Medicine

## 2013-01-16 ENCOUNTER — Ambulatory Visit (INDEPENDENT_AMBULATORY_CARE_PROVIDER_SITE_OTHER): Payer: Medicare Other | Admitting: Internal Medicine

## 2013-01-16 ENCOUNTER — Other Ambulatory Visit: Payer: Self-pay | Admitting: Internal Medicine

## 2013-01-16 VITALS — BP 100/70 | HR 93 | Temp 98.4°F | Wt 276.0 lb

## 2013-01-16 DIAGNOSIS — Z9181 History of falling: Secondary | ICD-10-CM

## 2013-01-16 DIAGNOSIS — E039 Hypothyroidism, unspecified: Secondary | ICD-10-CM

## 2013-01-16 DIAGNOSIS — R296 Repeated falls: Secondary | ICD-10-CM

## 2013-01-16 DIAGNOSIS — I10 Essential (primary) hypertension: Secondary | ICD-10-CM

## 2013-01-16 LAB — BASIC METABOLIC PANEL
CO2: 25 mEq/L (ref 19–32)
Chloride: 106 mEq/L (ref 96–112)
Glucose, Bld: 106 mg/dL — ABNORMAL HIGH (ref 70–99)
Potassium: 4.2 mEq/L (ref 3.5–5.1)
Sodium: 141 mEq/L (ref 135–145)

## 2013-01-16 NOTE — Patient Instructions (Signed)
Proceed with PT  Will notify you  of labs when available. Then make plan for follow up.  Take the thyroid pill 1 hours separated from your other medicaitons and vitamins as we discussed.  As much as possible.
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
## 2013-01-19 NOTE — Telephone Encounter (Signed)
This patient has not had a lipid panel since 01/21/12.  Please advise.  Thanks!!!

## 2013-01-19 NOTE — Telephone Encounter (Signed)
Please refill x3 months  Please add lipid panel onto her lab work that should be scheduled for 2 months from now. Thank you

## 2013-01-20 NOTE — Telephone Encounter (Signed)
[facility] called requesting for medication to sent in.  Rx sent in to pharmacy. Pls add lab work. Thanks!

## 2013-01-21 ENCOUNTER — Ambulatory Visit: Payer: Medicare Other | Admitting: Physical Therapy

## 2013-01-23 ENCOUNTER — Ambulatory Visit: Payer: Medicare Other | Admitting: Physical Therapy

## 2013-01-24 ENCOUNTER — Other Ambulatory Visit: Payer: Self-pay | Admitting: Internal Medicine

## 2013-01-25 ENCOUNTER — Other Ambulatory Visit: Payer: Self-pay | Admitting: Internal Medicine

## 2013-01-26 ENCOUNTER — Ambulatory Visit: Payer: Medicare Other | Admitting: Physical Therapy

## 2013-01-26 ENCOUNTER — Other Ambulatory Visit: Payer: Self-pay | Admitting: Family Medicine

## 2013-01-26 DIAGNOSIS — I10 Essential (primary) hypertension: Secondary | ICD-10-CM

## 2013-01-26 DIAGNOSIS — E039 Hypothyroidism, unspecified: Secondary | ICD-10-CM

## 2013-01-26 DIAGNOSIS — R7989 Other specified abnormal findings of blood chemistry: Secondary | ICD-10-CM

## 2013-01-27 ENCOUNTER — Other Ambulatory Visit: Payer: Self-pay | Admitting: Internal Medicine

## 2013-01-27 MED ORDER — ROSUVASTATIN CALCIUM 10 MG PO TABS: ORAL_TABLET | ORAL | Status: DC

## 2013-01-27 MED ORDER — LEVOTHYROXINE SODIUM 75 MCG PO TABS: 75.0000 ug | ORAL_TABLET | Freq: Every day | ORAL | Status: DC

## 2013-01-28 ENCOUNTER — Ambulatory Visit: Payer: Medicare Other | Admitting: Physical Therapy

## 2013-01-29 ENCOUNTER — Other Ambulatory Visit: Payer: Self-pay | Admitting: Internal Medicine

## 2013-02-01 ENCOUNTER — Other Ambulatory Visit: Payer: Self-pay | Admitting: Internal Medicine

## 2013-02-04 ENCOUNTER — Ambulatory Visit: Payer: Medicare Other | Admitting: Physical Therapy

## 2013-02-06 ENCOUNTER — Ambulatory Visit: Payer: Medicare Other | Admitting: Physical Therapy

## 2013-02-09 ENCOUNTER — Ambulatory Visit: Payer: Medicare Other | Attending: Internal Medicine | Admitting: Physical Therapy

## 2013-02-09 ENCOUNTER — Other Ambulatory Visit: Payer: Medicare Other

## 2013-02-09 DIAGNOSIS — IMO0001 Reserved for inherently not codable concepts without codable children: Secondary | ICD-10-CM | POA: Insufficient documentation

## 2013-02-09 DIAGNOSIS — M545 Low back pain, unspecified: Secondary | ICD-10-CM | POA: Insufficient documentation

## 2013-02-09 DIAGNOSIS — R5381 Other malaise: Secondary | ICD-10-CM | POA: Insufficient documentation

## 2013-02-11 ENCOUNTER — Ambulatory Visit: Payer: Medicare Other | Admitting: Physical Therapy

## 2013-02-16 ENCOUNTER — Ambulatory Visit: Payer: Medicare Other | Admitting: Physical Therapy

## 2013-02-18 ENCOUNTER — Ambulatory Visit: Payer: Medicare Other | Admitting: Physical Therapy

## 2013-02-20 ENCOUNTER — Other Ambulatory Visit: Payer: Self-pay | Admitting: Internal Medicine

## 2013-02-20 DIAGNOSIS — Z1231 Encounter for screening mammogram for malignant neoplasm of breast: Secondary | ICD-10-CM

## 2013-02-27 ENCOUNTER — Other Ambulatory Visit: Payer: Self-pay | Admitting: Internal Medicine

## 2013-03-01 ENCOUNTER — Other Ambulatory Visit: Payer: Self-pay | Admitting: Internal Medicine

## 2013-03-05 ENCOUNTER — Ambulatory Visit (HOSPITAL_COMMUNITY)
Admission: RE | Admit: 2013-03-05 | Discharge: 2013-03-05 | Disposition: A | Discharge status: Home / Self Care | Payer: Medicare Other | Source: Ambulatory Visit | Attending: Internal Medicine | Admitting: Internal Medicine

## 2013-03-05 DIAGNOSIS — Z1231 Encounter for screening mammogram for malignant neoplasm of breast: Secondary | ICD-10-CM | POA: Insufficient documentation

## 2013-03-23 ENCOUNTER — Other Ambulatory Visit (INDEPENDENT_AMBULATORY_CARE_PROVIDER_SITE_OTHER): Payer: Medicare Other

## 2013-03-23 DIAGNOSIS — I10 Essential (primary) hypertension: Secondary | ICD-10-CM

## 2013-03-23 DIAGNOSIS — R946 Abnormal results of thyroid function studies: Secondary | ICD-10-CM

## 2013-03-23 LAB — BASIC METABOLIC PANEL
BUN: 23 mg/dL (ref 6–23)
CO2: 27 mEq/L (ref 19–32)
Chloride: 107 mEq/L (ref 96–112)
Glucose, Bld: 104 mg/dL — ABNORMAL HIGH (ref 70–99)
Potassium: 4.4 mEq/L (ref 3.5–5.1)
Sodium: 139 mEq/L (ref 135–145)

## 2013-03-30 ENCOUNTER — Ambulatory Visit (INDEPENDENT_AMBULATORY_CARE_PROVIDER_SITE_OTHER): Payer: Medicare Other | Admitting: Internal Medicine

## 2013-03-30 ENCOUNTER — Encounter: Payer: Self-pay | Admitting: Internal Medicine

## 2013-03-30 VITALS — BP 124/80 | HR 96 | Wt 273.0 lb

## 2013-03-30 DIAGNOSIS — I10 Essential (primary) hypertension: Secondary | ICD-10-CM

## 2013-03-30 DIAGNOSIS — E039 Hypothyroidism, unspecified: Secondary | ICD-10-CM

## 2013-03-30 DIAGNOSIS — M48061 Spinal stenosis, lumbar region without neurogenic claudication: Secondary | ICD-10-CM

## 2013-03-30 DIAGNOSIS — N289 Disorder of kidney and ureter, unspecified: Secondary | ICD-10-CM

## 2013-03-30 NOTE — Patient Instructions (Signed)
Thyroid  And kidney function and  bp is ok range for surgery.   Stay on same dose of medication .  Will send note to dr Mikal Plane ok for surgery . ROV in 6 months or as needed  Can do labs at that visit as needed.
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
## 2013-04-01 ENCOUNTER — Telehealth: Payer: Self-pay | Admitting: Diagnostic Neuroimaging

## 2013-04-01 NOTE — Telephone Encounter (Signed)
Patient wanted to know neurosurgeon referral info. She had found it when I called. Just verified right contact info. Patient agreed.

## 2013-04-02 ENCOUNTER — Telehealth: Payer: Self-pay | Admitting: Internal Medicine

## 2013-04-02 NOTE — Telephone Encounter (Signed)
Pt was here Monday and computers were down so she could not give you the name of her med. The name of the new medication is HYDROXYZINE HCL.  FYI.

## 2013-04-03 ENCOUNTER — Other Ambulatory Visit: Payer: Self-pay | Admitting: Family Medicine

## 2013-04-03 NOTE — Telephone Encounter (Signed)
Left message on home phone for the pt to return my call.  Need to know the dosage.

## 2013-04-03 NOTE — Telephone Encounter (Signed)
Patient called back to report hydroxyzine 25mg .  Placed in medications.

## 2013-04-07 ENCOUNTER — Other Ambulatory Visit: Payer: Self-pay | Admitting: Family Medicine

## 2013-04-07 MED ORDER — POTASSIUM CHLORIDE CRYS ER 20 MEQ PO TBCR: 20.0000 meq | EXTENDED_RELEASE_TABLET | Freq: Every day | ORAL | Status: DC

## 2013-06-09 ENCOUNTER — Emergency Department (HOSPITAL_COMMUNITY)
Admission: EM | Admit: 2013-06-09 | Discharge: 2013-06-09 | Disposition: A | Discharge status: Home / Self Care | Payer: Medicare Other | Attending: Emergency Medicine | Admitting: Emergency Medicine

## 2013-06-09 ENCOUNTER — Encounter (HOSPITAL_COMMUNITY): Payer: Self-pay | Admitting: *Deleted

## 2013-06-09 DIAGNOSIS — Z8742 Personal history of other diseases of the female genital tract: Secondary | ICD-10-CM | POA: Insufficient documentation

## 2013-06-09 DIAGNOSIS — Z8719 Personal history of other diseases of the digestive system: Secondary | ICD-10-CM | POA: Insufficient documentation

## 2013-06-09 DIAGNOSIS — Z8619 Personal history of other infectious and parasitic diseases: Secondary | ICD-10-CM | POA: Insufficient documentation

## 2013-06-09 DIAGNOSIS — Z79899 Other long term (current) drug therapy: Secondary | ICD-10-CM | POA: Insufficient documentation

## 2013-06-09 DIAGNOSIS — Z860100 Personal history of colon polyps, unspecified: Secondary | ICD-10-CM | POA: Insufficient documentation

## 2013-06-09 DIAGNOSIS — Z8781 Personal history of (healed) traumatic fracture: Secondary | ICD-10-CM | POA: Insufficient documentation

## 2013-06-09 DIAGNOSIS — F3289 Other specified depressive episodes: Secondary | ICD-10-CM | POA: Insufficient documentation

## 2013-06-09 DIAGNOSIS — I10 Essential (primary) hypertension: Secondary | ICD-10-CM | POA: Insufficient documentation

## 2013-06-09 DIAGNOSIS — F411 Generalized anxiety disorder: Secondary | ICD-10-CM | POA: Insufficient documentation

## 2013-06-09 DIAGNOSIS — E785 Hyperlipidemia, unspecified: Secondary | ICD-10-CM | POA: Insufficient documentation

## 2013-06-09 DIAGNOSIS — R109 Unspecified abdominal pain: Secondary | ICD-10-CM | POA: Insufficient documentation

## 2013-06-09 DIAGNOSIS — IMO0001 Reserved for inherently not codable concepts without codable children: Secondary | ICD-10-CM

## 2013-06-09 DIAGNOSIS — IMO0002 Reserved for concepts with insufficient information to code with codable children: Secondary | ICD-10-CM | POA: Insufficient documentation

## 2013-06-09 DIAGNOSIS — F329 Major depressive disorder, single episode, unspecified: Secondary | ICD-10-CM | POA: Insufficient documentation

## 2013-06-09 LAB — URINALYSIS, ROUTINE W REFLEX MICROSCOPIC
Glucose, UA: NEGATIVE mg/dL
Ketones, ur: NEGATIVE mg/dL
Nitrite: NEGATIVE
Specific Gravity, Urine: 1.018 (ref 1.005–1.030)
pH: 7 (ref 5.0–8.0)

## 2013-06-09 LAB — URINE MICROSCOPIC-ADD ON

## 2013-06-09 MED ORDER — METHOCARBAMOL 500 MG PO TABS: 500.0000 mg | ORAL_TABLET | Freq: Two times a day (BID) | ORAL | Status: DC

## 2013-06-09 MED ORDER — HYDROCODONE-ACETAMINOPHEN 5-325 MG PO TABS: 1.0000 | ORAL_TABLET | ORAL | Status: DC | PRN

## 2013-06-09 MED ORDER — PREDNISONE 10 MG PO TABS: 20.0000 mg | ORAL_TABLET | Freq: Every day | ORAL | Status: DC

## 2013-06-09 MED ORDER — DEXAMETHASONE SODIUM PHOSPHATE 10 MG/ML IJ SOLN
10.0000 mg | Freq: Once | INTRAMUSCULAR | Status: AC
  Administered 2013-06-09: 10 mg via INTRAMUSCULAR
  Filled 2013-06-09: qty 1
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
## 2013-06-09 NOTE — ED Notes (Signed)
Pt reports hx of back pain, having pains to right lower back that's radiating down back of her leg and into side and RLQ area. Having difficulty bearing weight on right leg. Denies any urinary symptoms or n/v/d.

## 2013-06-09 NOTE — ED Provider Notes (Signed)
Patient has a history of chronic back pain that she indicates is mainly in the midline lower lumbar area. She is followed by Dr. Mikal Plane, neurosurgeon, and she is going to have surgery done next year for arthritis and disc disease. She reports 2 days ago when she tried to get out of bed she had new pain that is in her right lower flank area that goes down into her right leg at about the level of the hip. She states this is new. She states it is a sharp pain. It is made worse with movement or changing positions. She denies any urinary incontinence or rectal incontinence. She denies any numbness in her legs.  Patient is obese. She has difficulty getting out of a chair. She has pain on range of motion of her waist especially with range of motion to the right. She has some mild diffuse tenderness in her lower back but she seems most painful  when she changes positions.  Medical screening examination/treatment/procedure(s) were conducted as a shared visit with non-physician practitioner(s) and myself.  I personally evaluated the patient during the encounter  Devoria Albe, MD, Franz Dell, MD 06/09/13 [phone]

## 2013-06-09 NOTE — ED Notes (Addendum)
Pt now co having UTI and states her urine is brown in color.  RN asked if she had mentioned complaint to MD, pt states no she just thought of it.  Pt denies N/V/D states her pain is in her back and goes down both legs.  Pt alert oriented X4

## 2013-06-09 NOTE — ED Notes (Signed)
MD at bedside. 

## 2013-06-10 LAB — URINE CULTURE: Colony Count: 80000

## 2013-06-11 ENCOUNTER — Telehealth: Payer: Self-pay

## 2013-06-11 NOTE — Telephone Encounter (Signed)
Pt walked into office to ask if the medications given by the ED were ok for her to take with the medications she is currently on. Per Dr. Fabian Sharp ok for pt to take prednisone, hydrocodone and roboxain- the prednisone may make her mood altered a little; pt advised to be careful with taking hydrocodone due to balance concerns.  Pt verbalized understanding. Dr. Fabian Sharp is aware.

## 2013-07-12 ENCOUNTER — Other Ambulatory Visit: Payer: Self-pay | Admitting: Internal Medicine

## 2013-07-17 ENCOUNTER — Other Ambulatory Visit: Payer: Self-pay | Admitting: Neurosurgery

## 2013-07-17 DIAGNOSIS — M47816 Spondylosis without myelopathy or radiculopathy, lumbar region: Secondary | ICD-10-CM

## 2013-07-30 ENCOUNTER — Telehealth: Payer: Self-pay | Admitting: Internal Medicine

## 2013-07-30 NOTE — Telephone Encounter (Signed)
Patient Information:  Caller Name: Kadin  Phone: [phone]  Patient: [name], [name]  Gender: Female  DOB: 1950/11/12  Age: 62 Years  PCP: Berniece Andreas (Family Practice)  Office Follow Up:  Does the office need to follow up with this patient?: No  Instructions For The Office: N/A  RN Note:  Agrees to plan; she will hang up and dial EMS for immediate transport to nearest ER.  Symptoms  Reason For Call & Symptoms: Diarrhea with abd pain (6/10) since Sunday 07/26/13. Diarrhea approx 2x per day but states she has had no warning that she has to go and is incontinent of bowel and it even has occurred in her sleep without her knowing. States she feels week and faint and the pain is bad when she tries to stand up or bend over and upon further questioning states the stool is very dark in color, possibly black. She is out of town visiting relatives in [place].  Reviewed Health History In EMR: Yes  Reviewed Medications In EMR: Yes  Reviewed Allergies In EMR: Yes  Reviewed Surgeries / Procedures: Yes  Date of Onset of Symptoms: 07/26/2013  Guideline(s) Used:  Abdominal Pain - Female  Disposition Per Guideline:   Call EMS 911 Now  Reason For Disposition Reached:   Shock suspected (e.g., cold/pale/clammy skin, too weak to stand)  Advice Given:  N/A  Patient Will Follow Care Advice:  YES

## 2013-08-19 ENCOUNTER — Ambulatory Visit
Admission: RE | Admit: 2013-08-19 | Discharge: 2013-08-19 | Disposition: A | Discharge status: Home / Self Care | Payer: Medicare Other | Source: Ambulatory Visit | Attending: Neurosurgery | Admitting: Neurosurgery

## 2013-08-19 DIAGNOSIS — M47816 Spondylosis without myelopathy or radiculopathy, lumbar region: Secondary | ICD-10-CM

## 2013-08-24 ENCOUNTER — Telehealth: Payer: Self-pay | Admitting: Internal Medicine

## 2013-08-24 NOTE — Telephone Encounter (Signed)
Pt dropped off handicapped license plate  paperwork on fri, but did not fill out paperwork for [place] to contact her. pls call pt when ready for pick up. Advised pt Dr Fabian Sharp out until 12/22. thanks

## 2013-08-24 NOTE — Telephone Encounter (Signed)
Will call the patient when the paperwork is ready.  Note not needed.

## 2013-09-07 ENCOUNTER — Encounter (HOSPITAL_COMMUNITY): Payer: Self-pay | Admitting: Pharmacy Technician

## 2013-09-08 ENCOUNTER — Encounter (HOSPITAL_COMMUNITY)
Admission: RE | Admit: 2013-09-08 | Discharge: 2013-09-08 | Disposition: A | Discharge status: Home / Self Care | Payer: Medicare Other | Source: Ambulatory Visit | Attending: Neurosurgery | Admitting: Neurosurgery

## 2013-09-08 ENCOUNTER — Ambulatory Visit (HOSPITAL_COMMUNITY)
Admission: RE | Admit: 2013-09-08 | Discharge: 2013-09-08 | Disposition: A | Discharge status: Home / Self Care | Payer: Medicare Other | Source: Ambulatory Visit | Attending: Anesthesiology | Admitting: Anesthesiology

## 2013-09-08 ENCOUNTER — Telehealth: Payer: Self-pay | Admitting: Internal Medicine

## 2013-09-08 ENCOUNTER — Encounter (HOSPITAL_COMMUNITY): Payer: Self-pay

## 2013-09-08 DIAGNOSIS — M48061 Spinal stenosis, lumbar region without neurogenic claudication: Secondary | ICD-10-CM | POA: Insufficient documentation

## 2013-09-08 DIAGNOSIS — IMO0002 Reserved for concepts with insufficient information to code with codable children: Secondary | ICD-10-CM | POA: Insufficient documentation

## 2013-09-08 DIAGNOSIS — M4180 Other forms of scoliosis, site unspecified: Secondary | ICD-10-CM | POA: Insufficient documentation

## 2013-09-08 DIAGNOSIS — M47817 Spondylosis without myelopathy or radiculopathy, lumbosacral region: Secondary | ICD-10-CM | POA: Insufficient documentation

## 2013-09-08 DIAGNOSIS — Z01812 Encounter for preprocedural laboratory examination: Secondary | ICD-10-CM | POA: Insufficient documentation

## 2013-09-08 HISTORY — DX: Personal history of other diseases of the nervous system and sense organs: Z86.69

## 2013-09-08 HISTORY — DX: Dorsalgia, unspecified: M54.9

## 2013-09-08 HISTORY — DX: Hypothyroidism, unspecified: E03.9

## 2013-09-08 HISTORY — DX: Personal history of other medical treatment: Z92.89

## 2013-09-08 HISTORY — DX: Gastro-esophageal reflux disease without esophagitis: K21.9

## 2013-09-08 HISTORY — DX: Other chronic pain: G89.29

## 2013-09-08 LAB — BASIC METABOLIC PANEL
BUN: 13 mg/dL (ref 6–23)
Calcium: 9.2 mg/dL (ref 8.4–10.5)
Creatinine, Ser: 1.31 mg/dL — ABNORMAL HIGH (ref 0.50–1.10)
GFR calc Af Amer: 49 mL/min — ABNORMAL LOW (ref >90)
GFR calc non Af Amer: 43 mL/min — ABNORMAL LOW (ref >90)
Potassium: 4.7 mEq/L (ref 3.7–5.3)

## 2013-09-08 LAB — CBC
Hemoglobin: 12.1 g/dL (ref 12.0–15.0)
MCH: 30.4 pg (ref 26.0–34.0)
MCHC: 32.3 g/dL (ref 30.0–36.0)
Platelets: 264 10*3/uL (ref 150–400)
RDW: 13.3 % (ref 11.5–15.5)
WBC: 4.5 10*3/uL (ref 4.0–10.5)

## 2013-09-08 LAB — SURGICAL PCR SCREEN
MRSA, PCR: NEGATIVE
Staphylococcus aureus: POSITIVE — AB

## 2013-09-08 LAB — TYPE AND SCREEN
ABO/RH(D): B POS
Antibody Screen: NEGATIVE

## 2013-09-08 NOTE — Telephone Encounter (Signed)
Pt is calling to inquire about about the status of her handicap sticker. Please assist.

## 2013-09-08 NOTE — Progress Notes (Signed)
Pt doesn't have a cardiologist  Denies ever having an echo/stress test/heart cath  Medical Md is with [facility] [address] is Dr. Berniece Andreas  Denies EKG or CXR in past yr

## 2013-09-08 NOTE — Pre-Procedure Instructions (Signed)
[name]  09/08/2013   Your procedure is scheduled on:  Fri, Jan 9 @ 7:30 AM  Report to [facility] Stay [facility]trance A  at 5:30 AM.  Call this number if you have problems the morning of surgery: [phone]   Remember:   Do not eat food or drink liquids after midnight.   Take these medicines the morning of surgery with A SIP OF WATER: Wellbutrin(Bupropion),Lexaprol(Escitalopram),Esomeprazole(Nexium),Lamictal(Lamotrigine),and Levothyroxine(Synthroid)               No Goody's,BC's,Aleve,Aspirin,Ibuprofen,Fish Oil,or any Herbal Medications   Do not wear jewelry, make-up or nail polish.  Do not wear lotions, powders, or perfumes. You may wear deodorant.  Do not shave 48 hours prior to surgery.   Do not bring valuables to the hospital.  [facility] is not responsible                  for any belongings or valuables.               Contacts, dentures or bridgework may not be worn into surgery.  Leave suitcase in the car. After surgery it may be brought to your room.  For patients admitted to the hospital, discharge time is determined by your                treatment team.                   Special Instructions: Shower using CHG 2 nights before surgery and the night before surgery.  If you shower the day of surgery use CHG.  Use special wash - you have one bottle of CHG for all showers.  You should use approximately 1/3 of the bottle for each shower.   Please read over the following fact sheets that you were given: Pain Booklet, Coughing and Deep Breathing, Blood Transfusion Information, MRSA Information and Surgical Site Infection Prevention

## 2013-09-09 NOTE — Telephone Encounter (Signed)
Thought it was done please check

## 2013-09-11 ENCOUNTER — Telehealth: Payer: Self-pay | Admitting: Internal Medicine

## 2013-09-11 NOTE — Telephone Encounter (Signed)
Spoke to the pt.  Informed the pt that we do not have a copy of her paperwork.  She will bring a new copy on Monday.  Instructed the patient to ask for me when dropping off her paperwork. 

## 2013-09-11 NOTE — Telephone Encounter (Signed)
Pt states she still has not received her competed DMV form for handicapp tag. Please call back with status of form, needs before her surgery which is 09/18/13.

## 2013-09-11 NOTE — Telephone Encounter (Signed)
Spoke to the pt.  Informed the pt that we do not have a copy of her paperwork.  She will bring a new copy on Monday.  Instructed the patient to ask for me when dropping off her paperwork.

## 2013-09-14 ENCOUNTER — Ambulatory Visit: Payer: Medicare Other | Admitting: Internal Medicine

## 2013-09-17 MED ORDER — CEFAZOLIN SODIUM-DEXTROSE 2-3 GM-% IV SOLR
2.0000 g | INTRAVENOUS | Status: AC
  Administered 2013-09-18: 2 g via INTRAVENOUS
  Filled 2013-09-17: qty 50

## 2013-09-18 ENCOUNTER — Inpatient Hospital Stay (HOSPITAL_COMMUNITY): Payer: Medicare Other | Admitting: Anesthesiology

## 2013-09-18 ENCOUNTER — Encounter (HOSPITAL_COMMUNITY): Payer: Self-pay | Admitting: *Deleted

## 2013-09-18 ENCOUNTER — Inpatient Hospital Stay (HOSPITAL_COMMUNITY)
Admission: RE | Admit: 2013-09-18 | Discharge: 2013-09-25 | DRG: 460 | Disposition: A | Discharge status: Skilled Nursing Facility | Payer: Medicare Other | Source: Ambulatory Visit | Attending: Neurosurgery | Admitting: Neurosurgery

## 2013-09-18 ENCOUNTER — Inpatient Hospital Stay (HOSPITAL_COMMUNITY): Payer: Medicare Other

## 2013-09-18 ENCOUNTER — Encounter (HOSPITAL_COMMUNITY): Payer: Medicare Other | Admitting: Anesthesiology

## 2013-09-18 ENCOUNTER — Encounter (HOSPITAL_COMMUNITY)
Admission: RE | Disposition: A | Discharge status: Skilled Nursing Facility | Payer: Self-pay | Source: Ambulatory Visit | Attending: Neurosurgery

## 2013-09-18 DIAGNOSIS — E669 Obesity, unspecified: Secondary | ICD-10-CM | POA: Diagnosis present

## 2013-09-18 DIAGNOSIS — M4316 Spondylolisthesis, lumbar region: Secondary | ICD-10-CM | POA: Diagnosis present

## 2013-09-18 DIAGNOSIS — I10 Essential (primary) hypertension: Secondary | ICD-10-CM | POA: Diagnosis present

## 2013-09-18 DIAGNOSIS — Z886 Allergy status to analgesic agent status: Secondary | ICD-10-CM

## 2013-09-18 DIAGNOSIS — Q762 Congenital spondylolisthesis: Principal | ICD-10-CM

## 2013-09-18 DIAGNOSIS — M47817 Spondylosis without myelopathy or radiculopathy, lumbosacral region: Secondary | ICD-10-CM | POA: Diagnosis present

## 2013-09-18 DIAGNOSIS — Z79899 Other long term (current) drug therapy: Secondary | ICD-10-CM

## 2013-09-18 DIAGNOSIS — M5126 Other intervertebral disc displacement, lumbar region: Secondary | ICD-10-CM | POA: Diagnosis present

## 2013-09-18 SURGERY — POSTERIOR LUMBAR FUSION 1 LEVEL
Anesthesia: General | Site: Spine Lumbar

## 2013-09-18 MED ORDER — DIPHENHYDRAMINE HCL 12.5 MG/5ML PO ELIX: 12.5000 mg | ORAL_SOLUTION | Freq: Four times a day (QID) | ORAL | Status: DC | PRN

## 2013-09-18 MED ORDER — HYDROMORPHONE HCL PF 1 MG/ML IJ SOLN
0.2500 mg | INTRAMUSCULAR | Status: DC | PRN
  Administered 2013-09-18 (×2): 0.5 mg via INTRAVENOUS

## 2013-09-18 MED ORDER — NEOSTIGMINE METHYLSULFATE 1 MG/ML IJ SOLN
INTRAMUSCULAR | Status: DC | PRN
  Administered 2013-09-18: 4 mg via INTRAVENOUS

## 2013-09-18 MED ORDER — HYDROCHLOROTHIAZIDE 25 MG PO TABS
25.0000 mg | ORAL_TABLET | Freq: Every day | ORAL | Status: DC
  Administered 2013-09-18 – 2013-09-25 (×8): 25 mg via ORAL
  Filled 2013-09-18 (×8): qty 1

## 2013-09-18 MED ORDER — HYDROXYZINE HCL 25 MG PO TABS: 25.0000 mg | ORAL_TABLET | Freq: Three times a day (TID) | ORAL | Status: DC | PRN

## 2013-09-18 MED ORDER — HYDROMORPHONE 0.3 MG/ML IV SOLN: INTRAVENOUS | Status: DC

## 2013-09-18 MED ORDER — MIDAZOLAM HCL 5 MG/5ML IJ SOLN
INTRAMUSCULAR | Status: DC | PRN
  Administered 2013-09-18: 2 mg via INTRAVENOUS

## 2013-09-18 MED ORDER — PROPOFOL 10 MG/ML IV BOLUS
INTRAVENOUS | Status: DC | PRN
  Administered 2013-09-18: 200 mg via INTRAVENOUS

## 2013-09-18 MED ORDER — IRBESARTAN 300 MG PO TABS
300.0000 mg | ORAL_TABLET | Freq: Every day | ORAL | Status: DC
  Administered 2013-09-18 – 2013-09-25 (×8): 300 mg via ORAL
  Filled 2013-09-18 (×8): qty 1

## 2013-09-18 MED ORDER — DIPHENHYDRAMINE HCL 50 MG/ML IJ SOLN
12.5000 mg | Freq: Four times a day (QID) | INTRAMUSCULAR | Status: DC | PRN
Start: 2013-09-18 — End: 2013-09-20

## 2013-09-18 MED ORDER — HYDROCODONE-ACETAMINOPHEN 5-325 MG PO TABS
1.0000 | ORAL_TABLET | ORAL | Status: DC | PRN
  Administered 2013-09-18 – 2013-09-21 (×2): 2 via ORAL
  Administered 2013-09-23 – 2013-09-24 (×2): 1 via ORAL
  Filled 2013-09-18: qty 2
  Filled 2013-09-18 (×2): qty 1
  Filled 2013-09-18: qty 2

## 2013-09-18 MED ORDER — LEVOTHYROXINE SODIUM 75 MCG PO TABS
75.0000 ug | ORAL_TABLET | Freq: Every day | ORAL | Status: DC
Start: 2013-09-19 — End: 2013-09-25
  Administered 2013-09-19 – 2013-09-25 (×6): 75 ug via ORAL
  Filled 2013-09-18 (×8): qty 1

## 2013-09-18 MED ORDER — LIDOCAINE HCL (CARDIAC) 20 MG/ML IV SOLN
INTRAVENOUS | Status: DC | PRN
  Administered 2013-09-18: 20 mg via INTRAVENOUS
  Administered 2013-09-18: 80 mg via INTRAVENOUS

## 2013-09-18 MED ORDER — OXYCODONE HCL 5 MG PO TABS: 5.0000 mg | ORAL_TABLET | Freq: Once | ORAL | Status: DC | PRN

## 2013-09-18 MED ORDER — ONDANSETRON HCL 4 MG/2ML IJ SOLN: 4.0000 mg | Freq: Four times a day (QID) | INTRAMUSCULAR | Status: DC | PRN

## 2013-09-18 MED ORDER — LIDOCAINE-EPINEPHRINE 0.5 %-1:200000 IJ SOLN
INTRAMUSCULAR | Status: DC | PRN
  Administered 2013-09-18: 10 mL

## 2013-09-18 MED ORDER — POTASSIUM CHLORIDE CRYS ER 20 MEQ PO TBCR
20.0000 meq | EXTENDED_RELEASE_TABLET | Freq: Every day | ORAL | Status: DC
  Administered 2013-09-18 – 2013-09-25 (×8): 20 meq via ORAL
  Filled 2013-09-18 (×9): qty 1

## 2013-09-18 MED ORDER — LACTATED RINGERS IV SOLN
INTRAVENOUS | Status: DC | PRN
  Administered 2013-09-18 (×3): via INTRAVENOUS

## 2013-09-18 MED ORDER — DIPHENHYDRAMINE HCL 50 MG/ML IJ SOLN: 12.5000 mg | Freq: Four times a day (QID) | INTRAMUSCULAR | Status: DC | PRN

## 2013-09-18 MED ORDER — HYDROMORPHONE 0.3 MG/ML IV SOLN
INTRAVENOUS | Status: DC
  Administered 2013-09-18: 17:00:00 via INTRAVENOUS
  Administered 2013-09-18: 0.4 mg via INTRAVENOUS
  Administered 2013-09-19: 1.33 mg via INTRAVENOUS
  Administered 2013-09-19: 0.6 mg via INTRAVENOUS
  Administered 2013-09-19: 0.399 mg via INTRAVENOUS
  Administered 2013-09-19: 1.6 mg via INTRAVENOUS
  Administered 2013-09-19: 1.12 mg via INTRAVENOUS
  Administered 2013-09-19: 1.59 mg via INTRAVENOUS
  Administered 2013-09-20: 0.3 mg via INTRAVENOUS
  Administered 2013-09-20: 1.8 mg via INTRAVENOUS
  Administered 2013-09-20: 7.5 mg via INTRAVENOUS
  Administered 2013-09-20: 1.8 mg via INTRAVENOUS
  Administered 2013-09-20: 1.55 mg via INTRAVENOUS
  Filled 2013-09-18 (×2): qty 25

## 2013-09-18 MED ORDER — SODIUM CHLORIDE 0.9 % IJ SOLN: 3.0000 mL | INTRAMUSCULAR | Status: DC | PRN

## 2013-09-18 MED ORDER — PANTOPRAZOLE SODIUM 40 MG PO TBEC
80.0000 mg | DELAYED_RELEASE_TABLET | Freq: Every day | ORAL | Status: DC
  Administered 2013-09-18 – 2013-09-25 (×6): 80 mg via ORAL
  Filled 2013-09-18 (×4): qty 2

## 2013-09-18 MED ORDER — HYDROMORPHONE HCL PF 1 MG/ML IJ SOLN
INTRAMUSCULAR | Status: AC
  Filled 2013-09-18: qty 1

## 2013-09-18 MED ORDER — VITAMIN D3 25 MCG (1000 UNIT) PO TABS
1000.0000 [IU] | ORAL_TABLET | Freq: Every day | ORAL | Status: DC
  Administered 2013-09-18 – 2013-09-25 (×8): 1000 [IU] via ORAL
  Filled 2013-09-18 (×8): qty 1

## 2013-09-18 MED ORDER — OXYCODONE HCL 5 MG PO TABS
5.0000 mg | ORAL_TABLET | ORAL | Status: DC | PRN
  Administered 2013-09-24 – 2013-09-25 (×2): 10 mg via ORAL
  Filled 2013-09-18 (×2): qty 2

## 2013-09-18 MED ORDER — NALOXONE HCL 0.4 MG/ML IJ SOLN: 0.4000 mg | INTRAMUSCULAR | Status: DC | PRN

## 2013-09-18 MED ORDER — ALUM & MAG HYDROXIDE-SIMETH 200-200-20 MG/5ML PO SUSP: 30.0000 mL | Freq: Four times a day (QID) | ORAL | Status: DC | PRN

## 2013-09-18 MED ORDER — PHENOL 1.4 % MT LIQD: 1.0000 | OROMUCOSAL | Status: DC | PRN

## 2013-09-18 MED ORDER — ESCITALOPRAM OXALATE 20 MG PO TABS
20.0000 mg | ORAL_TABLET | Freq: Every day | ORAL | Status: DC
  Administered 2013-09-18 – 2013-09-25 (×8): 20 mg via ORAL
  Filled 2013-09-18 (×8): qty 1

## 2013-09-18 MED ORDER — HYDROMORPHONE HCL PF 1 MG/ML IJ SOLN
INTRAMUSCULAR | Status: DC | PRN
  Administered 2013-09-18: 0.5 mg via INTRAVENOUS

## 2013-09-18 MED ORDER — 0.9 % SODIUM CHLORIDE (POUR BTL) OPTIME
TOPICAL | Status: DC | PRN
  Administered 2013-09-18: 1000 mL

## 2013-09-18 MED ORDER — ACETAMINOPHEN 650 MG RE SUPP: 650.0000 mg | RECTAL | Status: DC | PRN

## 2013-09-18 MED ORDER — SENNA 8.6 MG PO TABS
1.0000 | ORAL_TABLET | Freq: Two times a day (BID) | ORAL | Status: DC
  Administered 2013-09-18 – 2013-09-25 (×14): 8.6 mg via ORAL
  Filled 2013-09-18 (×16): qty 1

## 2013-09-18 MED ORDER — FENTANYL CITRATE 0.05 MG/ML IJ SOLN
INTRAMUSCULAR | Status: DC | PRN
  Administered 2013-09-18 (×4): 50 ug via INTRAVENOUS
  Administered 2013-09-18: 100 ug via INTRAVENOUS
  Administered 2013-09-18 (×2): 50 ug via INTRAVENOUS
  Administered 2013-09-18: 100 ug via INTRAVENOUS

## 2013-09-18 MED ORDER — ONDANSETRON HCL 4 MG/2ML IJ SOLN: 4.0000 mg | INTRAMUSCULAR | Status: DC | PRN

## 2013-09-18 MED ORDER — MENTHOL 3 MG MT LOZG
1.0000 | LOZENGE | OROMUCOSAL | Status: DC | PRN
  Filled 2013-09-18: qty 9

## 2013-09-18 MED ORDER — POLYETHYLENE GLYCOL 3350 17 G PO PACK
17.0000 g | PACK | Freq: Every day | ORAL | Status: DC | PRN
  Filled 2013-09-18: qty 1

## 2013-09-18 MED ORDER — SODIUM CHLORIDE 0.9 % IV SOLN: 250.0000 mL | INTRAVENOUS | Status: DC

## 2013-09-18 MED ORDER — SODIUM CHLORIDE 0.9 % IJ SOLN
3.0000 mL | Freq: Two times a day (BID) | INTRAMUSCULAR | Status: DC
  Administered 2013-09-22 – 2013-09-25 (×4): 3 mL via INTRAVENOUS

## 2013-09-18 MED ORDER — SODIUM CHLORIDE 0.9 % IJ SOLN: 9.0000 mL | INTRAMUSCULAR | Status: DC | PRN

## 2013-09-18 MED ORDER — ONDANSETRON HCL 4 MG/2ML IJ SOLN
INTRAMUSCULAR | Status: DC | PRN
  Administered 2013-09-18: 4 mg via INTRAVENOUS

## 2013-09-18 MED ORDER — TELMISARTAN-HCTZ 80-25 MG PO TABS: 1.0000 | ORAL_TABLET | Freq: Every day | ORAL | Status: DC

## 2013-09-18 MED ORDER — OXYCODONE HCL 5 MG/5ML PO SOLN: 5.0000 mg | Freq: Once | ORAL | Status: DC | PRN

## 2013-09-18 MED ORDER — ARTIFICIAL TEARS OP OINT
TOPICAL_OINTMENT | OPHTHALMIC | Status: DC | PRN
  Administered 2013-09-18: 1 via OPHTHALMIC

## 2013-09-18 MED ORDER — BUPROPION HCL ER (XL) 150 MG PO TB24
150.0000 mg | ORAL_TABLET | Freq: Every day | ORAL | Status: DC
  Administered 2013-09-18 – 2013-09-25 (×8): 150 mg via ORAL
  Filled 2013-09-18 (×8): qty 1

## 2013-09-18 MED ORDER — LAMOTRIGINE 200 MG PO TABS
200.0000 mg | ORAL_TABLET | Freq: Every day | ORAL | Status: DC
  Administered 2013-09-18 – 2013-09-25 (×8): 200 mg via ORAL
  Filled 2013-09-18 (×8): qty 1

## 2013-09-18 MED ORDER — ATORVASTATIN CALCIUM 20 MG PO TABS
20.0000 mg | ORAL_TABLET | Freq: Every day | ORAL | Status: DC
  Administered 2013-09-18 – 2013-09-23 (×6): 20 mg via ORAL
  Filled 2013-09-18 (×8): qty 1

## 2013-09-18 MED ORDER — ACETAMINOPHEN 325 MG PO TABS: 650.0000 mg | ORAL_TABLET | ORAL | Status: DC | PRN

## 2013-09-18 MED ORDER — ONDANSETRON HCL 4 MG/2ML IJ SOLN: 4.0000 mg | Freq: Once | INTRAMUSCULAR | Status: DC | PRN

## 2013-09-18 MED ORDER — POTASSIUM CHLORIDE IN NACL 20-0.9 MEQ/L-% IV SOLN
INTRAVENOUS | Status: DC
  Administered 2013-09-18 – 2013-09-19 (×2): via INTRAVENOUS
  Filled 2013-09-18 (×15): qty 1000

## 2013-09-18 MED ORDER — QUETIAPINE FUMARATE 400 MG PO TABS
800.0000 mg | ORAL_TABLET | Freq: Every day | ORAL | Status: DC
  Administered 2013-09-18: 800 mg via ORAL
  Administered 2013-09-19: 400 mg via ORAL
  Administered 2013-09-20 – 2013-09-24 (×5): 800 mg via ORAL
  Filled 2013-09-18 (×10): qty 2

## 2013-09-18 MED ORDER — ROCURONIUM BROMIDE 100 MG/10ML IV SOLN
INTRAVENOUS | Status: DC | PRN
  Administered 2013-09-18: 25 mg via INTRAVENOUS
  Administered 2013-09-18: 50 mg via INTRAVENOUS

## 2013-09-18 MED ORDER — THROMBIN 20000 UNITS EX SOLR
OROMUCOSAL | Status: DC | PRN
  Administered 2013-09-18: 10:00:00 via TOPICAL

## 2013-09-18 MED ORDER — DEXAMETHASONE SODIUM PHOSPHATE 4 MG/ML IJ SOLN
INTRAMUSCULAR | Status: DC | PRN
  Administered 2013-09-18: 8 mg via INTRAVENOUS

## 2013-09-18 MED ORDER — GLYCOPYRROLATE 0.2 MG/ML IJ SOLN
INTRAMUSCULAR | Status: DC | PRN
  Administered 2013-09-18: 0.6 mg via INTRAVENOUS

## 2013-09-18 SURGICAL SUPPLY — 69 items
ADH SKN CLS APL DERMABOND .7 (GAUZE/BANDAGES/DRESSINGS) ×1
APL SKNCLS STERI-STRIP NONHPOA (GAUZE/BANDAGES/DRESSINGS)
BENZOIN TINCTURE PRP APPL 2/3 (GAUZE/BANDAGES/DRESSINGS) IMPLANT
BLADE SURG ROTATE 9660 (MISCELLANEOUS) IMPLANT
BUR MATCHSTICK NEURO 3.0 LAGG (BURR) ×3 IMPLANT
CAGE CAPSTONE 11X22X0 SPINAL (Cage) ×4 IMPLANT
CANISTER SUCT 3000ML (MISCELLANEOUS) ×3 IMPLANT
CLOSURE WOUND 1/2 X4 (GAUZE/BANDAGES/DRESSINGS)
CONT SPEC 4OZ CLIKSEAL STRL BL (MISCELLANEOUS) ×3 IMPLANT
COVER BACK TABLE 24X17X13 BIG (DRAPES) IMPLANT
DECANTER SPIKE VIAL GLASS SM (MISCELLANEOUS) ×3 IMPLANT
DERMABOND ADVANCED (GAUZE/BANDAGES/DRESSINGS) ×2
DERMABOND ADVANCED .7 DNX12 (GAUZE/BANDAGES/DRESSINGS) ×1 IMPLANT
DRAPE C-ARM 42X72 X-RAY (DRAPES) ×6 IMPLANT
DRAPE C-ARMOR (DRAPES) ×2 IMPLANT
DRAPE LAPAROTOMY 100X72X124 (DRAPES) ×3 IMPLANT
DRAPE POUCH INSTRU U-SHP 10X18 (DRAPES) ×3 IMPLANT
DRAPE SURG 17X23 STRL (DRAPES) ×3 IMPLANT
DRESSING TELFA 8X3 (GAUZE/BANDAGES/DRESSINGS) IMPLANT
DRSG OPSITE POSTOP 4X8 (GAUZE/BANDAGES/DRESSINGS) ×2 IMPLANT
DURAPREP 26ML APPLICATOR (WOUND CARE) ×3 IMPLANT
ELECT BLADE 4.0 EZ CLEAN MEGAD (MISCELLANEOUS) ×3
ELECT REM PT RETURN 9FT ADLT (ELECTROSURGICAL) ×3
ELECTRODE BLDE 4.0 EZ CLN MEGD (MISCELLANEOUS) IMPLANT
ELECTRODE REM PT RTRN 9FT ADLT (ELECTROSURGICAL) ×1 IMPLANT
GAUZE SPONGE 4X4 16PLY XRAY LF (GAUZE/BANDAGES/DRESSINGS) ×2 IMPLANT
GLOVE BIOGEL PI IND STRL 7.5 (GLOVE) IMPLANT
GLOVE BIOGEL PI INDICATOR 7.5 (GLOVE) ×2
GLOVE ECLIPSE 6.5 STRL STRAW (GLOVE) ×6 IMPLANT
GLOVE ECLIPSE 7.5 STRL STRAW (GLOVE) ×4 IMPLANT
GLOVE EXAM NITRILE LRG STRL (GLOVE) IMPLANT
GLOVE EXAM NITRILE MD LF STRL (GLOVE) IMPLANT
GLOVE EXAM NITRILE XL STR (GLOVE) IMPLANT
GLOVE EXAM NITRILE XS STR PU (GLOVE) IMPLANT
GOWN BRE IMP SLV AUR LG STRL (GOWN DISPOSABLE) ×6 IMPLANT
GOWN BRE IMP SLV AUR XL STRL (GOWN DISPOSABLE) ×4 IMPLANT
GOWN STRL REIN 2XL LVL4 (GOWN DISPOSABLE) IMPLANT
GRAFT BN 5X1XSPNE CVD POST DBM (Bone Implant) IMPLANT
GRAFT BONE MAGNIFUSE 1X5CM (Bone Implant) ×3 IMPLANT
KIT BASIN OR (CUSTOM PROCEDURE TRAY) ×3 IMPLANT
KIT POSITION SURG JACKSON T1 (MISCELLANEOUS) ×3 IMPLANT
KIT ROOM TURNOVER OR (KITS) ×3 IMPLANT
MILL MEDIUM DISP (BLADE) ×2 IMPLANT
NDL HYPO 25X1 1.5 SAFETY (NEEDLE) ×1 IMPLANT
NDL SPNL 18GX3.5 QUINCKE PK (NEEDLE) IMPLANT
NEEDLE HYPO 25X1 1.5 SAFETY (NEEDLE) ×3 IMPLANT
NEEDLE SPNL 18GX3.5 QUINCKE PK (NEEDLE) IMPLANT
NS IRRIG 1000ML POUR BTL (IV SOLUTION) ×3 IMPLANT
PACK LAMINECTOMY NEURO (CUSTOM PROCEDURE TRAY) ×3 IMPLANT
PAD ARMBOARD 7.5X6 YLW CONV (MISCELLANEOUS) ×6 IMPLANT
ROD COBALT 47.5X35 (Rod) ×4 IMPLANT
SCREW MAS 6.5X40 (Screw) ×4 IMPLANT
SCREW MAS 6.5X45 (Screw) ×4 IMPLANT
SCREW SET SOLERA (Screw) ×12 IMPLANT
SCREW SET SOLERA TI (Screw) IMPLANT
SPONGE GAUZE 4X4 12PLY (GAUZE/BANDAGES/DRESSINGS) IMPLANT
SPONGE LAP 4X18 X RAY DECT (DISPOSABLE) IMPLANT
SPONGE SURGIFOAM ABS GEL 100 (HEMOSTASIS) ×3 IMPLANT
STRIP CLOSURE SKIN 1/2X4 (GAUZE/BANDAGES/DRESSINGS) IMPLANT
SUT PROLENE 6 0 BV (SUTURE) IMPLANT
SUT VIC AB 0 CT1 18XCR BRD8 (SUTURE) ×1 IMPLANT
SUT VIC AB 0 CT1 8-18 (SUTURE) ×6
SUT VIC AB 2-0 CT1 18 (SUTURE) ×3 IMPLANT
SUT VIC AB 3-0 SH 8-18 (SUTURE) ×3 IMPLANT
SYR 20ML ECCENTRIC (SYRINGE) ×3 IMPLANT
TOWEL OR 17X24 6PK STRL BLUE (TOWEL DISPOSABLE) ×3 IMPLANT
TOWEL OR 17X26 10 PK STRL BLUE (TOWEL DISPOSABLE) ×3 IMPLANT
TRAY FOLEY CATH 14FRSI W/METER (CATHETERS) ×3 IMPLANT
WATER STERILE IRR 1000ML POUR (IV SOLUTION) ×3 IMPLANT

## 2013-09-18 NOTE — Progress Notes (Signed)
Pt. Very sleppy , having to be reminded to breath, DR. Crews at bedside, Dilaudid 0.5 iv given and will take to room

## 2013-09-18 NOTE — Anesthesia Preprocedure Evaluation (Signed)
Anesthesia Evaluation  Patient identified by MRN, date of birth, ID band Patient awake    Reviewed: Allergy & Precautions, H&P , NPO status , Patient's Chart, lab work & pertinent test results  Airway Mallampati: I TM Distance: >3 FB Neck ROM: Full    Dental  (+) Teeth Intact and Dental Advisory Given   Pulmonary  breath sounds clear to auscultation        Cardiovascular hypertension, Pt. on medications Rhythm:Regular Rate:Normal     Neuro/Psych    GI/Hepatic GERD-  Medicated and Controlled,  Endo/Other  Morbid obesity  Renal/GU Renal InsufficiencyRenal disease     Musculoskeletal   Abdominal   Peds  Hematology   Anesthesia Other Findings   Reproductive/Obstetrics                           Anesthesia Physical Anesthesia Plan  ASA: II  Anesthesia Plan: General   Post-op Pain Management:    Induction: Intravenous  Airway Management Planned: Oral ETT  Additional Equipment:   Intra-op Plan:   Post-operative Plan: Extubation in OR  Informed Consent: I have reviewed the patients History and Physical, chart, labs and discussed the procedure including the risks, benefits and alternatives for the proposed anesthesia with the patient or authorized representative who has indicated his/her understanding and acceptance.   Dental advisory given  Plan Discussed with: Anesthesiologist, Surgeon and CRNA  Anesthesia Plan Comments:         Anesthesia Quick Evaluation

## 2013-09-18 NOTE — H&P (Signed)
BP 134/80  Pulse 87  Temp(Src) 97.7 F (36.5 C) (Oral)  Resp 18  SpO2 100%   HISTORY OF PRESENT ILLNESS:                     Mrs. Sprecher returns today.  She at this point in time would like to consider the epidural injections.  She is markedly stenotic at L4-5 and has significant facet arthropathy along with listhesis of 4 on 5.  She had wanted to give this some consideration when I saw her last in February, and she returns today stating that at this time she would like to move forward.    REVIEW OF SYSTEMS:                                    Positive for weight gain, back pain, joint swelling, extremity weakness, gait disturbance and a headache, anxiety and depression.  Denies Respiratory, Reproductive, Skin, Hematologic, Genitourinary, Allergic, Cardiovascular, Endocrine, Ears, Nose, Throat, Mouth, Eye or GI symptoms.   PAST MEDICAL HISTORY:                                           Medications and Allergies:  She is currently taking bupropion, hydroxyzine, Klor-Con, Lamictal, Lexapro, Nexium, Seroquel, Synthroid and Ultram.   SHE HAS AN ALLERGY TO ASPIRIN.   PHYSICAL EXAMINATION:                                She is 64.96" in height, weighs 259 pounds.  Blood pressure is 124/85.  Pulse is 99.    NEUROLOGICAL EXAMINATION:                       She is alert and oriented x4 and answering all questions appropriately.  Memory, language, attention span and fund of knowledge is normal.  Symmetric facial movements.  Pupils equal, round and reactive to light.  Pulse is good at the wrist.             Motor Examination - Strength is 5/5 upper and lower extremities.            Sensory Examination - Intact proprioception in the lower extremities.            Deep Tendon Reflexes - She has 2+ reflexes at the knees, trace at the ankles.            Cerebellar Examination - Normal muscle tone, bulk and coordination.    Ms. Fratus returns today and we went over the new MRI comparing to the one done  in December of last year.  She does have the disc herniation I believe in the far lateral region on the right side at L4-5.  She has facet arthropathy and foraminal narrowing.  I do not think that the left is particularly much better than the right, but she states she has no pain there.   IMPRESSION/PLAN:                             She speaks about a significant amount of low back pain.  I do therefore believe that a fusion at L4-5 and discectomy with  hardware is her best option for her decompression.  She has a listhesis at that level and therefore I think she needs to be fused.  The decompression can be completed.  Risks and benefits, bleeding, infection, no relief, need for further surgery, fusion failure, and hardware failure all were discussed.  I gave her a detailed instruction sheet at her previous visit.  We will plan on this on 09/18/2013.

## 2013-09-18 NOTE — Progress Notes (Signed)
Nutrition Brief Note  Patient identified on the Malnutrition Screening Tool (MST) Report  Wt Readings from Last 15 Encounters:  09/18/13 258 lb 3.2 oz (117.119 kg)  09/18/13 258 lb 3.2 oz (117.119 kg)  09/08/13 251 lb 1.6 oz (113.898 kg)  06/09/13 266 lb (120.657 kg)  03/30/13 273 lb (123.832 kg)  01/16/13 276 lb (125.193 kg)  01/06/13 275 lb 3.2 oz (124.83 kg)  12/29/12 283 lb (128.368 kg)  12/08/12 274 lb (124.286 kg)  08/18/12 283 lb (128.368 kg)  10/07/12 280 lb (127.007 kg)  09/24/12 262 lb (118.842 kg)  07/28/12 285 lb (129.275 kg)  01/21/12 276 lb (125.193 kg)  01/08/12 270 lb (122.471 kg)    Body mass index is 41.69 kg/(m^2). Patient meets criteria for Morbid Obesity based on current BMI. Weight history shows 6% weight loss in the past 6 months. Pt reports recent weight loss was intentional. She reports eating more fruit PTA and eating mostly small TV dinners. Pt happy with her weight loss.   Current diet order is Regular. Diet just advanced this afternoon. Pt states her appetite is good and she is ready to eat. Labs and medications reviewed.   No nutrition interventions warranted at this time. If nutrition issues arise, please consult RD.   Pryor Ochoa RD, LDN Inpatient Clinical Dietitian Pager: [phone] After Hours Pager: [phone]

## 2013-09-18 NOTE — Progress Notes (Signed)
1331 , pt states pain is a 10/10, Dilaudid 0.5 given per orders

## 2013-09-18 NOTE — Op Note (Signed)
09/18/2013  7:16 PM  PATIENT:  [name]  63 y.o. 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 female whom was taken to the operating room intubated, and placed under a general anesthetic without difficulty. A foley catheter was placed under sterile conditions. She was positioned prone on a Jackson stable with all pressure points properly padded.  Her lumbar region was prepped and draped in a sterile manner. I infiltrated 10cc's 1/2%lidocaine/1:2000,000 strength epinephrine into the planned incision. I opened the skin with a 10 blade and took the incision down to the thoracolumbar fascia. I exposed the lamina of L3,4 and 5 in a subperiosteal fashion bilaterally. I confirmed my location with an intraoperative xray.  I placed self retaining retractors and started the decompression.  I decompressed the spinal canal beyond what was necessary for a plif by performing complete inferior facetectomies of L4. This was done to decompress both the lateral recesses and the foramina lateral to the canal. This allowed for the L4 roots to have free egress from the spinal canal. I also aggressively removed some of L5 to decompress the L5 roots.  I had  already exposed the L4/5 disc spaces with the decompression. I opened the annulus with a 15 blade on each side then proceeded with the discetomy to prepare the disc space. I used curettes, the drill, rongeurs, Kerrison punches, disc space shavers, rasps, and other tools to remove the disc and soft tissue. When the space was fully prepared I measured the space and placed two Capstone peek 11x48mm cages in the disc space. I packed local autograft anterior to the peek cages and inside them also. I checked their location with fluoroscopy and both were satisfactory.  We used the drill to decorticate the lateral margins of bone at L4 and L5. We then with fluoroscopic guidance placed pedicle screws at L4 and L5 bilaterally. I drilled a pilot hole, sounded the pedicle, tapped the pedicle then placed the screws. I did not appreciate any cutouts. I connected the rods to the pedicle screws and secured them in place with locking caps. I placed morselize allograft(bag in a bone)over the lateral bone to complete the posterolateral arthrodesis.  We closed the wound in layers approximating the thoracolumbar fascia, subcutaneous, and subcuticular planes with vicryl sutures. I used dermabond for a sterile dressing and a honeycomb dressing on top of that.    PLAN OF CARE: Admit to inpatient   PATIENT DISPOSITION:  PACU - hemodynamically stable.   Delay start of Pharmacological VTE agent (>24hrs) due to surgical blood loss or risk of bleeding:  yes

## 2013-09-18 NOTE — Preoperative (Signed)
Beta Blockers   Reason not to administer Beta Blockers:Not Applicable 

## 2013-09-18 NOTE — Progress Notes (Signed)
Bilateral abdominal folds with redness from OR, bacitracin applied in OR

## 2013-09-18 NOTE — Anesthesia Postprocedure Evaluation (Signed)
  Anesthesia Post-op Note  Patient: [name]  Procedure(s) Performed: Procedure(s) with comments: POSTERIOR LUMBAR FUSION 1 LEVEL (N/A) - L45 decompression with posterior lumbar interbody fusion with interbody prosthesis posterior lateral arthrodesis and posterior nonsegmental instrumentation  Patient Location: PACU  Anesthesia Type:General  Level of Consciousness: awake, alert  and oriented  Airway and Oxygen Therapy: Patient Spontanous Breathing and Patient connected to nasal cannula oxygen  Post-op Pain: mild  Post-op Assessment: Post-op Vital signs reviewed  Post-op Vital Signs: Reviewed  Complications: No apparent anesthesia complications

## 2013-09-18 NOTE — Progress Notes (Signed)
   CARE MANAGEMENT NOTE 09/18/2013  Patient:  [name], [name]   Account Number:  [id number]  Date Initiated:  09/18/2013  Documentation initiated by:  Olga Coaster  Subjective/Objective Assessment:   ADMITTED WITH BACK SURGERY     Action/Plan:   HOME WITH [facility]   Anticipated DC Date:  09/20/2013   Anticipated DC Plan:  [place]  CM consult      Choice offered to / List presented to:  C-1 Patient   DME arranged  3-N-1  Vassie Moselle      DME agency  [facility] arranged  [place].   Status of service:  In process, will continue to follow Medicare Important Message given?  NA - LOS <3 / Initial given by admissions (If response is "NO", the following Medicare IM given date fields will be blank)  Per UR Regulation:  Reviewed for med. necessity/level of care/duration of stay  Comments:  1/9/2015Mindi Slicker RN,BSN,MHA [phone]

## 2013-09-18 NOTE — Transfer of Care (Signed)
Immediate Anesthesia Transfer of Care Note  Patient: [name]  Procedure(s) Performed: Procedure(s) with comments: POSTERIOR LUMBAR FUSION 1 LEVEL (N/A) - L45 decompression with posterior lumbar interbody fusion with interbody prosthesis posterior lateral arthrodesis and posterior nonsegmental instrumentation  Patient Location: PACU  Anesthesia Type:General  Level of Consciousness: patient cooperative and responds to stimulation  Airway & Oxygen Therapy: Patient Spontanous Breathing and Patient connected to nasal cannula oxygen  Post-op Assessment: Report given to PACU RN, Post -op Vital signs reviewed and stable and Patient moving all extremities X 4  Post vital signs: Reviewed and stable  Complications: No apparent anesthesia complications

## 2013-09-18 NOTE — Progress Notes (Signed)
Talked to patient about DCP/ HHC needs; [place] choices offered, patient chose [facility]; Mary with [facility] called for arrangements; Aneta Mins [phone]

## 2013-09-19 MED ORDER — DIAZEPAM 5 MG PO TABS
5.0000 mg | ORAL_TABLET | Freq: Four times a day (QID) | ORAL | Status: DC | PRN
  Administered 2013-09-19 (×2): 5 mg via ORAL
  Filled 2013-09-19 (×2): qty 1

## 2013-09-19 MED ORDER — METHOCARBAMOL 500 MG PO TABS
500.0000 mg | ORAL_TABLET | Freq: Four times a day (QID) | ORAL | Status: DC | PRN
  Administered 2013-09-19 – 2013-09-24 (×3): 500 mg via ORAL
  Filled 2013-09-19 (×3): qty 1

## 2013-09-19 NOTE — Progress Notes (Signed)
Patient ID: [name], female   DOB: Dec 26, 1950, 63 y.o.   MRN: [id number] BP 114/67  Pulse 90  Temp(Src) 98.2 F (36.8 C) (Oral)  Resp 18  Ht 5\' 6"  (1.676 m)  Wt 117.119 kg (258 lb 3.2 oz)  BMI 41.69 kg/m2  SpO2 98% Alert and oriented x 4 Moving lower extremities Wound is clean and dry, no signs of infection Having spasms lower back, and legs Expected post operative pain

## 2013-09-19 NOTE — Progress Notes (Signed)
Pt voiced concerns about being discharged home "too early". Pt sts she lives alone and has no help at home. She sts she is afraid that we will try to discharge her  before she is fully ready. Pt sts:"I'm in no hurry to leave". Explained to pt that doctor will take into consideration pt's pain level as well as her living arrangement prior to discharge. Also explained to pt that we will discontinue foley cath in the morning and that pt will have to ambulate. She voiced understanding, however she became a lot more anxious and wanted to know if that's really necessary. Again reassured pt that we are doing what is in her best interest. Will continue to monitor.

## 2013-09-19 NOTE — Progress Notes (Signed)
Patient requested that foley should be left in for 1 more day because of the pains and spasm she has and does not think she could get up and void. MD aware. Will continue to monitor.

## 2013-09-19 NOTE — Evaluation (Signed)
Physical Therapy Evaluation Patient Details Name: [name] MRN: [id number] DOB: [birth year] Today's Date: 09/19/2013 Time: 7829-5621 PT Time Calculation (min): 17 min  PT Assessment / Plan / Recommendation History of Present Illness  s/p L4-L5 PLIF  Clinical Impression  Patient demonstrates deficits in functional mobility as indicated below. Pt will benefit from continued skilled PT to address deficits and maximize independence. Will continue to see as indicated. Recommend ST SNF upon discharge.    PT Assessment  Patient needs continued PT services    Follow Up Recommendations  SNF          Equipment Recommendations  Rolling walker with 5" wheels;3in1 (PT)       Frequency Min 5X/week    Precautions / Restrictions Precautions Precautions: Back Precaution Comments: educated pt on back precautions Required Braces or Orthoses: Other Brace/Splint (Dr. Christella Noa to order brace for pt) Other Brace/Splint: Dr. Christella Noa ok'd pt to get up to chair without brace per RN report.   Pertinent Vitals/Pain 7/10      Mobility  Bed Mobility Overal bed mobility: +2 for physical assistance;Needs Assistance Bed Mobility: Rolling;Sidelying to Sit Rolling: Min assist Sidelying to sit: Mod assist General bed mobility comments: Assist for log roll technique. Incr time due to pain. Transfers Overall transfer level: Needs assistance Transfers: Sit to/from Stand;Stand Pivot Transfers Sit to Stand: +2 physical assistance;Mod assist Stand pivot transfers: +2 physical assistance;Min assist General transfer comment: Assist for support with power up from bed and to pivot from bed to chair.    Exercises     PT Diagnosis: Difficulty walking;Abnormality of gait;Generalized weakness;Acute pain  PT Problem List: Decreased strength;Decreased range of motion;Decreased activity tolerance;Decreased balance;Decreased mobility;Decreased knowledge of use of DME;Obesity;Pain PT Treatment Interventions:  DME instruction;Gait training;Stair training;Functional mobility training;Therapeutic activities;Therapeutic exercise;Patient/family education     PT Goals(Current goals can be found in the care plan section) Acute Rehab PT Goals Patient Stated Goal: to be independent PT Goal Formulation: With patient Time For Goal Achievement: 10/03/13 Potential to Achieve Goals: Fair  Visit Information  Last PT Received On: 09/19/13 Assistance Needed: +2 PT/OT/SLP Co-Evaluation/Treatment: Yes Reason for Co-Treatment: For patient/therapist safety PT goals addressed during session: Mobility/safety with mobility OT goals addressed during session: ADL's and self-care History of Present Illness: s/p L4-L5 PLIF       Prior Functioning  Home Living Family/patient expects to be discharged to:: Skilled nursing facility Living Arrangements: Alone Additional Comments: Pt lives alone and reports she does not have anyone to assist her at home. Prior Function Level of Independence: Independent    Cognition  Cognition Arousal/Alertness: Awake/alert Behavior During Therapy: WFL for tasks assessed/performed Overall Cognitive Status: Within Functional Limits for tasks assessed          End of Session PT - End of Session Equipment Utilized During Treatment: Gait belt Activity Tolerance: Patient limited by pain Patient left: in chair;with call bell/phone within reach Nurse Communication: Mobility status  GP     Duncan Dull 09/19/2013, 3:42 PM  Alben Deeds, [place] DPT  [phone]

## 2013-09-19 NOTE — Evaluation (Signed)
Occupational Therapy Evaluation Patient Details Name: [name] MRN: [id number] DOB: Jan 09, 1951 Today's Date: 09/19/2013 Time: 9562-1308 OT Time Calculation (min): 17 min  OT Assessment / Plan / Recommendation History of present illness s/p L4-L5 PLIF   Clinical Impression   Pt admitted with above. Will continue to follow acutely in order to address below problem list. Recommending SNF for d/c planning since pt lives alone and does not have necessary level of assist at home.    OT Assessment  Patient needs continued OT Services    Follow Up Recommendations  SNF;Supervision/Assistance - 24 hour    Barriers to Discharge Decreased caregiver support pt lives alone  Equipment Recommendations   (TBD next venue)    Recommendations for Other Services    Frequency  Min 2X/week    Precautions / Restrictions Precautions Precautions: Back Precaution Comments: educated pt on back precautions Required Braces or Orthoses: Other Brace/Splint (Dr. Christella Noa to order brace for pt) Other Brace/Splint: Dr. Christella Noa ok'd pt to get up to chair without brace per RN report.   Pertinent Vitals/Pain See vitals    ADL  Eating/Feeding: Performed;Independent Where Assessed - Eating/Feeding: Chair Upper Body Bathing: Simulated;Minimal assistance Where Assessed - Upper Body Bathing: Supported sitting Lower Body Bathing: Simulated;+2 Total assistance Lower Body Bathing: Patient Percentage: 60% Where Assessed - Lower Body Bathing: Supported sit to stand Upper Body Dressing: Simulated;Minimal assistance Where Assessed - Upper Body Dressing: Supported sitting Lower Body Dressing: Performed;+2 Total assistance Lower Body Dressing: Patient Percentage: 60% Where Assessed - Lower Body Dressing: Supported sit to Lobbyist: Simulated;+2 Total assistance Toilet Transfer: Patient Percentage: 60% Armed forces technical officer Method: Arts development officer:  (bed<>chair) Equipment Used: Gait  belt Transfers/Ambulation Related to ADLs: +2 hand held assist to pivot from bed to chair ADL Comments: Incr time required for all aspects of mobility due to pain.    OT Diagnosis: Generalized weakness;Acute pain  OT Problem List: Decreased strength;Decreased activity tolerance;Decreased knowledge of use of DME or AE;Decreased knowledge of precautions;Pain OT Treatment Interventions: Self-care/ADL training;DME and/or AE instruction;Therapeutic activities;Patient/family education   OT Goals(Current goals can be found in the care plan section) Acute Rehab OT Goals Patient Stated Goal: to be independent OT Goal Formulation: With patient Time For Goal Achievement: 10/03/13 Potential to Achieve Goals: Good  Visit Information  Last OT Received On: 09/19/13 Assistance Needed: +2 PT/OT/SLP Co-Evaluation/Treatment: Yes Reason for Co-Treatment: For patient/therapist safety OT goals addressed during session: ADL's and self-care History of Present Illness: s/p L4-L5 PLIF       Prior Functioning     Home Living Family/patient expects to be discharged to:: Skilled nursing facility Living Arrangements: Alone Additional Comments: Pt lives alone and reports she does not have anyone to assist her at home. Prior Function Level of Independence: Independent         Vision/Perception     Cognition  Cognition Arousal/Alertness: Awake/alert Behavior During Therapy: WFL for tasks assessed/performed Overall Cognitive Status: Within Functional Limits for tasks assessed    Extremity/Trunk Assessment       Mobility Bed Mobility Overal bed mobility: +2 for physical assistance;Needs Assistance Bed Mobility: Rolling;Sidelying to Sit Rolling: Min assist Sidelying to sit: Mod assist General bed mobility comments: Assist for log roll technique. Incr time due to pain. Transfers Overall transfer level: Needs assistance Transfers: Sit to/from Stand;Stand Pivot Transfers Sit to Stand: +2  physical assistance;Mod assist Stand pivot transfers: +2 physical assistance;Min assist General transfer comment: Assist for support with power up from bed  and to pivot from bed to chair.     Exercise     Balance     End of Session OT - End of Session Equipment Utilized During Treatment: Gait belt Activity Tolerance: Patient limited by pain Patient left: in chair;with call bell/phone within reach Nurse Communication: Mobility status  GO    09/19/2013 Darrol Jump OTR/L Pager [phone] Office [phone]  Darrol Jump 09/19/2013, 1:55 PM

## 2013-09-20 NOTE — Progress Notes (Signed)
PCA check at 11:23, zero mg of dilaudid used since last check, however MAR would not allow me to enter a 0.0 mg dose, so i am entering it here. Reminded patient to push button if she is in pain. Patient says "i keep forgetting." will cont to monitor

## 2013-09-20 NOTE — Progress Notes (Signed)
Patient was OOB to chair with PT; after 45 min, RN and NT helped patient back to bed with brace and walker, call bell in reach. Patient voided

## 2013-09-20 NOTE — Progress Notes (Signed)
Doing well. C/o appropriate incisional soreness/ spasms Less leg pain, Numbness No Nausea /vomiting Starting to mobilize   Temp:  [94.7 F (34.8 C)-98.4 F (36.9 C)] 98.1 F (36.7 C) (01/11 0528) Pulse Rate:  [90-102] 100 (01/11 0528) Resp:  [16-26] 25 (01/11 0528) BP: (108-142)/(63-79) 142/79 mmHg (01/11 0528) SpO2:  [92 %-99 %] 93 % (01/11 0528) Good strength and sensation Incision CDI  Plan: Increase activity

## 2013-09-20 NOTE — Progress Notes (Signed)
Physical Therapy Treatment Patient Details Name: [name] MRN: [id number] DOB: 12-15-1950 Today's Date: 09/20/2013 Time: 7322-0254 PT Time Calculation (min): 19 min  PT Assessment / Plan / Recommendation  History of Present Illness s/p L4-L5 PLIF   PT Comments   Pt making slow progress with mobility today, limited by pain.  Follow Up Recommendations  SNF     Equipment Recommendations  Rolling walker with 5" wheels;3in1 (PT)       Frequency Min 5X/week   Progress towards PT Goals Progress towards PT goals: Progressing toward goals  Plan Current plan remains appropriate    Precautions / Restrictions Precautions Precautions: Back Precaution Comments: educated pt on back precautions Required Braces or Orthoses: Spinal Brace Spinal Brace: Lumbar corset;Applied in sitting position (total assist for brace application) Restrictions Weight Bearing Restrictions: No       Mobility  Bed Mobility Overal bed mobility: +2 for physical assistance Rolling: +2 for physical assistance;Mod assist Sidelying to sit: +2 for physical assistance;Mod assist General bed mobility comments: 2 person assist today to complete roll onto left side. attempted x3 before able to complete the roll due to pt yelling out and quickly returing to supine due to pain with first 2 attempts. once on left side pt needed assist to bring legs off bed and  transition trunk into seated position. cues for posture and to not lean back once seated edge of bed.       Transfers Overall transfer level: Needs assistance Equipment used: Rolling walker (2 wheeled) Sit to Stand: +2 physical assistance;Mod assist Stand pivot transfers: +2 physical assistance;Min assist General transfer comment: cues for hand placement and ant weight shift to stand. once standing pt was able to take 5-6 pivot steps bed to recliner with cues on posture, walker use and safety. mod assist with cues on hand placement to control descent with  sitting down to recliner.                  PT Goals (current goals can now be found in the care plan section) Acute Rehab PT Goals Patient Stated Goal: to be independent PT Goal Formulation: With patient Time For Goal Achievement: 10/03/13 Potential to Achieve Goals: Fair  Visit Information  Last PT Received On: 09/20/13 History of Present Illness: s/p L4-L5 PLIF    Subjective Data  Patient Stated Goal: to be independent   Cognition  Cognition Arousal/Alertness: Awake/alert Overall Cognitive Status: Impaired/Different from baseline Area of Impairment: Orientation;Memory;Safety/judgement;Awareness Orientation Level: Disoriented to;Time Memory: Decreased recall of precautions;Decreased short-term memory Safety/Judgement: Decreased awareness of safety;Decreased awareness of deficits General Comments: pt does not recall the last two days and was surprised to find out she had had her back surgery. no recall of previous therapy session or back precautions. reoriented pt to date, day, year and situation. pt was able to recall this information during the session.       End of Session PT - End of Session Equipment Utilized During Treatment: Gait belt;Back brace Activity Tolerance: Patient limited by pain Patient left: in chair;with call bell/phone within reach Nurse Communication: Mobility status   GP     Willow Ora 09/20/2013, 12:43 PM  Willow Ora, PTA Office- [phone]

## 2013-09-21 ENCOUNTER — Inpatient Hospital Stay (HOSPITAL_COMMUNITY): Payer: Medicare Other

## 2013-09-21 LAB — CBC WITH DIFFERENTIAL/PLATELET
BASOS PCT: 0 % (ref 0–1)
Basophils Absolute: 0 10*3/uL (ref 0.0–0.1)
Eosinophils Absolute: 0.1 10*3/uL (ref 0.0–0.7)
Eosinophils Relative: 1 % (ref 0–5)
HEMATOCRIT: 26.6 % — AB (ref 36.0–46.0)
Hemoglobin: 8.9 g/dL — ABNORMAL LOW (ref 12.0–15.0)
Lymphocytes Relative: 11 % — ABNORMAL LOW (ref 12–46)
Lymphs Abs: 1 10*3/uL (ref 0.7–4.0)
MCH: 30.9 pg (ref 26.0–34.0)
MCHC: 33.5 g/dL (ref 30.0–36.0)
MCV: 92.4 fL (ref 78.0–100.0)
MONO ABS: 0.9 10*3/uL (ref 0.1–1.0)
MONOS PCT: 9 % (ref 3–12)
Neutro Abs: 7.8 10*3/uL — ABNORMAL HIGH (ref 1.7–7.7)
Neutrophils Relative %: 79 % — ABNORMAL HIGH (ref 43–77)
Platelets: 225 10*3/uL (ref 150–400)
RBC: 2.88 MIL/uL — ABNORMAL LOW (ref 3.87–5.11)
RDW: 13 % (ref 11.5–15.5)
WBC: 9.8 10*3/uL (ref 4.0–10.5)

## 2013-09-21 LAB — BASIC METABOLIC PANEL
BUN: 18 mg/dL (ref 6–23)
CHLORIDE: 99 meq/L (ref 96–112)
CO2: 23 meq/L (ref 19–32)
Calcium: 8.7 mg/dL (ref 8.4–10.5)
Creatinine, Ser: 1.06 mg/dL (ref 0.50–1.10)
GFR calc Af Amer: 64 mL/min — ABNORMAL LOW (ref >90)
GFR calc non Af Amer: 55 mL/min — ABNORMAL LOW (ref >90)
GLUCOSE: 109 mg/dL — AB (ref 70–99)
POTASSIUM: 4.6 meq/L (ref 3.7–5.3)
Sodium: 135 mEq/L — ABNORMAL LOW (ref 137–147)

## 2013-09-21 LAB — URINALYSIS, ROUTINE W REFLEX MICROSCOPIC
BILIRUBIN URINE: NEGATIVE
Glucose, UA: NEGATIVE mg/dL
Hgb urine dipstick: NEGATIVE
Ketones, ur: NEGATIVE mg/dL
Leukocytes, UA: NEGATIVE
Nitrite: NEGATIVE
PH: 5 (ref 5.0–8.0)
Protein, ur: NEGATIVE mg/dL
SPECIFIC GRAVITY, URINE: 1.013 (ref 1.005–1.030)
Urobilinogen, UA: 1 mg/dL (ref 0.0–1.0)

## 2013-09-21 NOTE — Progress Notes (Signed)
Patient attempting to get out of bed unassisted pulled I/V out  In the process. Patient confused states I did not know where I was "i believe the medicine is making me confused" call to Dr Christella Noa,  PCA dilaudid d/c'd. Patient assisted to bedside commode states I feel like going to the bathroom but cant. No output recorded since beginning of shift. In and out cath performed obtained 450 cc of yellow clear urine. Will continue to monitor.

## 2013-09-21 NOTE — Clinical Social Work Placement (Signed)
Clinical Social Work Department CLINICAL SOCIAL WORK PLACEMENT NOTE 09/21/2013  Patient:  [name], [name]  Account Number:  [id number] [place] date:  08/18/2007  Clinical Social Worker:  Raquel Sarna [place], LCSWA  Date/time:  09/21/2013 01:03 PM  Clinical Social Work is seeking post-discharge placement for this patient at the following level of care:   [place]   (*CSW will update this form in Epic as items are completed)   09/21/2013  Patient/family provided with [place] Department of Clinical Social Works list of facilities offering this level of care within the geographic area requested by the patient (or if unable, by the patients family).  09/21/2013  Patient/family informed of their freedom to choose among providers that offer the needed level of care, that participate in Medicare, Medicaid or managed care program needed by the patient, have an available bed and are willing to accept the patient.  09/21/2013  Patient/family informed of [facility] ownership interest in [facility], as well as of the fact that they are under no obligation to receive care at this facility.  PASARR submitted to EDS on 09/21/2013 PASARR number received from EDS on   FL2 transmitted to all facilities in geographic area requested by pt/family on  09/21/2013 FL2 transmitted to all facilities within larger geographic area on   Patient informed that his/her managed care company has contracts with or will negotiate with  certain facilities, including the following:     Patient/family informed of bed offers received:   Patient chooses bed at  Physician recommends and patient chooses bed at    Patient to be transferred to  on   Patient to be transferred to facility by   The following physician request were entered in Epic:   Additional Comments:   Pati Gallo, [place] Worker [phone]

## 2013-09-21 NOTE — Clinical Social Work Psychosocial (Signed)
Clinical Social Work Department BRIEF PSYCHOSOCIAL ASSESSMENT 09/21/2013  Patient:  [name], [name]     Account Number:  [id number]     Admit date:  08/18/2007  Clinical Social Worker:  Donna Christen  Date/Time:  09/21/2013 12:31 PM  Referred by:  Physician  Date Referred:  09/21/2013 Referred for  SNF Placement   Other Referral:   none.   Interview type:  Family Other interview type:   CSW spoke to pt's daughter, [name] ([phone]), via phone regarding pt's discharge disposition. Pt is disoriented to time and situation.    PSYCHOSOCIAL DATA Living Status:  ALONE Admitted from facility:   Level of care:   Primary support name:  Seniyah Esker Primary support relationship to patient:  CHILD, ADULT Degree of support available:   Strong degree of support. Per daughter, she is pt's 17 and person that pt calls when she needs something.    CURRENT CONCERNS Current Concerns  Post-Acute Placement   Other Concerns:   none.    SOCIAL WORK ASSESSMENT / PLAN CSW spoke with pt's daughter via phone. Pt's daughter stated that prior to admission to [facility], pt was living alone in an apartment. Pt's daughter stated that family is agreeable to short-term SNF placement for rehabilitation. Per pt's daughter, pt has previously been to [facility] and family would like for her to be placed there. CSW informed pt's daughter that while [facility] is the primary choice, CSW will send clinical information to all [facility] for family to have a back-up or second choice. Pt's daughter was understanding and agreeable. CSW to continue to follow and assist with discharge planning needs.   Assessment/plan status:  Psychosocial Support/Ongoing Assessment of Needs Other assessment/ plan:   none.   Information/referral to community resources:   [facility] bed offers.    PATIENTS/FAMILYS RESPONSE TO PLAN OF CARE: Pt's daughter was understanding and agreeable to CSW  plan of care. Pt's daughter appreciative of CSW contacting [facility] to potentially have pt placed at family's first choice.       [name], [place] Social Worker [phone]

## 2013-09-21 NOTE — Progress Notes (Signed)
Paged Dr Christella Noa about patients increased confusion / hallucinating. Have not given any narcotics or benzos this shift. Received order for UA to be sent. Awaiting patient to void to send urine sample.  Will continue to monitor.

## 2013-09-21 NOTE — Progress Notes (Signed)
Physical Therapy Treatment Patient Details Name: [name] MRN: [id number] DOB: [birth year] Today's Date: 09/21/2013 Time: 5176-1607 PT Time Calculation (min): 20 min  PT Assessment / Plan / Recommendation  History of Present Illness s/p L4-L5 PLIF   PT Comments   Patient extremely confused during session, screaming out to go to the bathroom, could not follow commands for bed mobility, total assist for donning brace. +2 assist for mobility with LEs buckling as patient trying to sit before she reach the chair. Patient able to stand from chair with +2 min assist but could not maintain position. Anticipate as confusion clears, patients mobility will improve. Continue to recommend SNF.  Follow Up Recommendations  SNF           Equipment Recommendations  Rolling walker with 5" wheels;3in1 (PT)       Frequency Min 5X/week   Progress towards PT Goals Progress towards PT goals: Progressing toward goals  Plan Current plan remains appropriate    Precautions / Restrictions Precautions Precautions: Back Precaution Comments: educated pt on back precautions Required Braces or Orthoses: Spinal Brace Spinal Brace: Lumbar corset;Applied in sitting position (total assist for brace application)   Pertinent Vitals/Pain Patient reports no pain (pt confused)    Mobility  Bed Mobility Overal bed mobility: +2 for physical assistance Rolling: +2 for physical assistance;Mod assist Sidelying to sit: +2 for physical assistance;Mod assist Transfers Overall transfer level: Needs assistance Equipment used: Rolling walker (2 wheeled) Transfers: Sit to/from [facility] Sit to Stand: +2 physical assistance;Mod assist Stand pivot transfers: Max assist;+2 physical assistance General transfer comment: patient unable to follow commands, required significant assist to prevent fall Ambulation/Gait Ambulation/Gait assistance: +2 physical assistance Ambulation Distance (Feet): 6  Feet Assistive device: Rolling walker (2 wheeled) Gait Pattern/deviations: Step-to pattern;Shuffle General Gait Details: Patient buckled x2 prior to reaching chair, required +2 total assist with posterior support from PT thigh to prevent fall. Patient unable to follow commands for mobility      PT Goals (current goals can now be found in the care plan section) Acute Rehab PT Goals Patient Stated Goal: pt very confused today PT Goal Formulation: With patient Time For Goal Achievement: 10/03/13 Potential to Achieve Goals: Fair  Visit Information  Last PT Received On: 09/21/13 Assistance Needed: +2 History of Present Illness: s/p L4-L5 PLIF    Subjective Data  Patient Stated Goal: pt very confused today   Cognition  Cognition Arousal/Alertness: Awake/alert Overall Cognitive Status: Impaired/Different from baseline Area of Impairment: Orientation;Memory;Safety/judgement;Awareness Orientation Level: Disoriented to;Time Memory: Decreased recall of precautions;Decreased short-term memory Safety/Judgement: Decreased awareness of safety;Decreased awareness of deficits General Comments: pt does not recall the last two days and was surprised to find out she had had her back surgery. no recall of previous therapy session or back precautions. reoriented pt to date, day, year and situation. pt was able to recall this information during the session.       End of Session PT - End of Session Equipment Utilized During Treatment: Gait belt;Back brace Activity Tolerance: Patient limited by pain Patient left: in chair;with call bell/phone within reach Nurse Communication: Mobility status   GP     Duncan Dull 09/21/2013, 12:45 PM Alben Deeds, [place] DPT  [phone]

## 2013-09-21 NOTE — Progress Notes (Signed)
Patient ID: [name], female   DOB: [birth year], 63 y.o.   MRN: [id number] BP 120/48  Pulse 101  Temp(Src) 98.2 F (36.8 C) (Oral)  Resp 20  Ht 5\' 6"  (1.676 m)  Wt 117.119 kg (258 lb 3.2 oz)  BMI 41.69 kg/m2  SpO2 96% Alert, confused. Recognizes me, knows she has had surgery. Pressured speech Will follow commands. Wound is clean, dry, no signs of infection Moving lower extremities well Will send labs, obtain head ct

## 2013-09-21 NOTE — Clinical Documentation Improvement (Addendum)
THIS DOCUMENT IS NOT A PERMANENT PART OF THE MEDICAL RECORD  Please update your documentation with the medical record to reflect your response to this query. If you need help knowing how to do this please call [phone].  09/21/13  Per eval 1/9 by Registered Dietician BMI= 41.69. Please address in Notes and DC summary to illustrate severity of illness and risk of mortality. Thank you.  Possible Clinical conditions - Morbid Obesity: BMI =/> 40 - Other condition   Reviewed: additional documentation in the medical record  Thank You,  [place] Documentation Specialist: [place]

## 2013-09-21 NOTE — Progress Notes (Signed)
Patient very confused, paranoid, hallucinating, states she "doesn't want to take her meds until after she eats, but then says she is not hungry".  Yelling at staff to "get out of her room, she wants to go home". Patient talking to someone in the corner of her room, but nobody is there.  Patient is 2+ assist to ambulate short distances. Is not following proper spinal precautions despite RN trying to re-educate her. Pulled 2nd IV out. PCA was discontinued last night. Dr Christella Noa notified last night about confusion. Will continue to assess and reorient patient and try to get her to take her meds. Will try to hold off on pain medication as much as possible to see if her mental status clears any.

## 2013-09-22 MED ORDER — LORAZEPAM 1 MG PO TABS
1.0000 mg | ORAL_TABLET | Freq: Once | ORAL | Status: DC
  Filled 2013-09-22: qty 1

## 2013-09-22 MED ORDER — HALOPERIDOL LACTATE 5 MG/ML IJ SOLN
2.0000 mg | Freq: Once | INTRAMUSCULAR | Status: AC
  Administered 2013-09-22: 2 mg via INTRAMUSCULAR
  Filled 2013-09-22: qty 1

## 2013-09-22 NOTE — Progress Notes (Signed)
Restraints removed at 1100 to assist pt with voiding. Pt used bedside commode and RN and NT assisted pt to the chair. Pt daughter in room and pt appears calm and comfortable in chair with family in room. Restraints remain off. RN will continue to monitor.

## 2013-09-22 NOTE — Progress Notes (Signed)
Physical Therapy Treatment Patient Details Name: [name] MRN: [id number] DOB: 1951-08-04 Today's Date: 09/22/2013 Time: 6948-5462 PT Time Calculation (min): 21 min  PT Assessment / Plan / Recommendation  History of Present Illness s/p L4-L5 PLIF   PT Comments   Patient extremely confused with agitation today. Attempted mobility in hopes of creating some cognitive clearing. Patient did not tolerate well, continues to remain restless and further mobility held for patient safety at this time.    Follow Up Recommendations  SNF     Does the patient have the potential to tolerate intense rehabilitation     Barriers to Discharge        Equipment Recommendations  Rolling walker with 5" wheels;3in1 (PT)    Recommendations for Other Services    Frequency Min 5X/week   Progress towards PT Goals Progress towards PT goals: Not progressing toward goals - comment  Plan Current plan remains appropriate    Precautions / Restrictions Precautions Precautions: Back Precaution Comments: educated pt on back precautions Required Braces or Orthoses: Spinal Brace Spinal Brace: Lumbar corset;Applied in sitting position (total assist for brace application) Restrictions Weight Bearing Restrictions: No   Pertinent Vitals/Pain Patient confused, not complaining of pain    Mobility  Bed Mobility Overal bed mobility: +2 for physical assistance Bed Mobility: Rolling;Sidelying to Sit;Sit to Supine Rolling: Max assist;+2 for physical assistance Sidelying to sit: Max assist;+2 for physical assistance Sit to supine: Max assist;+2 for safety/equipment General bed mobility comments: Patient extremely anxious and fighting against assist, upon sitting deemed unsafe to proceed further, limited session to bed mobility only. Attmepts to educate patient with no carryover as patient is extremely confused and agitated at current time. Transfers General transfer comment: not safe to perform today     PT  Goals (current goals can now be found in the care plan section) Acute Rehab PT Goals PT Goal Formulation: With patient Time For Goal Achievement: 10/03/13 Potential to Achieve Goals: Fair  Visit Information  Last PT Received On: 09/22/13 Assistance Needed: +2 History of Present Illness: s/p L4-L5 PLIF    Subjective Data  Subjective: patient remains extremely confused and agitated today   Cognition  Cognition Arousal/Alertness: Awake/alert Overall Cognitive Status: Impaired/Different from baseline Area of Impairment: Orientation;Memory;Safety/judgement;Awareness Orientation Level: Disoriented to;Time Memory: Decreased recall of precautions;Decreased short-term memory Safety/Judgement: Decreased awareness of safety;Decreased awareness of deficits General Comments: Patient extremely agitated and confused today, attmepted mobility in hopes of gnereating sme cognitive clearing, unsafe to pursue further at this time.     Balance  General Comments General comments (skin integrity, edema, etc.): patient inconsistant with commands, continues attempts to climb out of bed, unsafe with all aspects of mobility, no understanding of situation at current time.   End of Session PT - End of Session Equipment Utilized During Treatment: Gait belt;Back brace Activity Tolerance: Patient limited by pain Patient left: in bed;with call bell/phone within reach;with bed alarm set;with restraints reapplied Nurse Communication: Mobility status   GP     Duncan Dull 09/22/2013, [place], PT DPT  [phone]

## 2013-09-22 NOTE — Progress Notes (Signed)
Patient ID: [name], female   DOB: [birth year], 63 y.o.   MRN: [id number] BP 122/62  Pulse 92  Temp(Src) 98.6 F (37 C) (Oral)  Resp 20  Ht 5\' 6"  (1.676 m)  Wt 117.119 kg (258 lb 3.2 oz)  BMI 41.69 kg/m2  SpO2 98% Alert and oriented x 4 Much better today cognitively Moving all extremities well Labs show no significant abnormalities, hct low expected after fusion No evidence of infection Head ct normal Continue with therapy.

## 2013-09-23 NOTE — Progress Notes (Addendum)
09/23/13 1200  OT Visit Information  Last OT Received On 09/23/13  Assistance Needed +1 (needed +2 for bed mobility)  History of Present Illness s/p L4-L5 PLIF  OT Time Calculation  OT Start Time 1108  OT Stop Time 1136  OT Time Calculation (min) 28 min  Precautions  Precautions Back;Fall  Precaution Comments educated pt on back precautions  Required Braces or Orthoses Spinal Brace  Spinal Brace Lumbar corset;Applied in sitting position  Restrictions  Weight Bearing Restrictions No  ADL  Grooming Teeth care;Wash/dry face;Minimal assistance;Min guard (washing face-Min guard sitting and Min A-teeth care standing)  Where Assessed - Grooming Supported standing;Supported sitting  Upper Body Bathing Minimal assistance  Where Assessed - Upper Body Bathing Supported sitting;Unsupported sitting  Lower Body Bathing Maximal assistance  Where Assessed - Lower Body Bathing Supported sit to stand  Upper Body Dressing Maximal assistance (back brace)  Where Assessed - Upper Body Dressing Supported sitting;Unsupported sitting  Toilet Transfer Minimal assistance  Toilet Transfer Method Sit to stand  Toilet Transfer Equipment Raised toilet seat with arms (or 3-in-1 over toilet)  Toileting - Clothing Manipulation and Hygiene Moderate assistance  Where Assessed - Toileting Clothing Manipulation and Hygiene Sit to stand from 3-in-1 or toilet  Equipment Used Back brace;Gait belt;Rolling walker  Transfers/Ambulation Related to ADLs Min/Mod A for ambulation. Min A/Min guard for transfers.  ADL Comments Educated that long handled sponge is what pt would use for LB bathing. OT assisted with back brace. Educated on energy conservation techniques as pt was fatigued during session.  Educated on use of cup for teeth care as well as having grooming items on right side of sink to avoid breaking precautions.  Cognition  Arousal/Alertness Awake/alert  Behavior During Therapy WFL for tasks assessed/performed   Overall Cognitive Status Impaired/Different from baseline  Area of Impairment Following commands;Safety/judgement;Problem solving  Following Commands Follows one step commands inconsistently  Safety/Judgement Decreased awareness of safety  Problem Solving Slow processing;Requires verbal cues;Requires tactile cues  Bed Mobility  Bed Mobility Rolling Right;Sit to Sidelying Left  Rolling Right 3: Mod assist  Sit to Sidelying Left 1: +2 Total assist;2: Max assist  Details for Bed Mobility Assistance Assist with trunk and legs to get back into bed. Cues for technique.   Transfers  Transfers Sit to Stand;Stand to Sit  Sit to Stand 4: Min assist;With upper extremity assist;From chair/3-in-1  Stand to Sit 4: Min assist;With upper extremity assist;To chair/3-in-1;To bed  Details for Transfer Assistance Cues for technique. Physical assist to help stand.  OT - End of Session  Equipment Utilized During Treatment Gait belt;Rolling walker;Back brace  Activity Tolerance Patient limited by fatigue;Patient limited by pain  Patient left in bed;with call bell/phone within reach;with bed alarm set  Nurse Communication (pain meds)  OT Assessment/Plan  OT Plan Discharge plan remains appropriate  OT Frequency Min 2X/week  Follow Up Recommendations SNF;Supervision/Assistance - 24 hour  OT Equipment (defer to next venue)  Acute Rehab OT Goals  Patient Stated Goal not stated  OT Goal Formulation With patient  Time For Goal Achievement 10/03/13  Potential to Achieve Goals Good  ADL Goals  Pt Will Perform Upper Body Bathing with modified independence;sitting  Pt Will Perform Lower Body Bathing with modified independence;sit to/from stand;with adaptive equipment  Pt Will Perform Upper Body Dressing with modified independence;sitting  Pt Will Perform Lower Body Dressing with modified independence;with adaptive equipment;sit to/from stand  Pt Will Transfer to Toilet with modified  independence;ambulating;bedside commode  Pt Will Perform  Toileting - Clothing Manipulation and hygiene with modified independence;with adaptive equipment;sit to/from stand  Additional ADL Goal #1 Pt will perform bed mobility at mod I level as precursor for EOB ADLs.  OT General Charges  $OT Visit 1 Procedure  OT Treatments  $Self Care/Home Management  23-37 mins    Pain 9/10 in back. Repositioned. Notified nurse for pain meds.    Roseanne Reno, OTR/L [phone]

## 2013-09-23 NOTE — Progress Notes (Signed)
Physical Therapy Treatment Patient Details Name: [name] MRN: [id number] DOB: 09-Dec-1950 Today's Date: 09/23/2013 Time: 4098-1191 PT Time Calculation (min): 23 min  PT Assessment / Plan / Recommendation  History of Present Illness s/p L4-L5 PLIF   PT Comments   Patient demonstrates improved cognition today thus reflecting in improvements with mobility and safety. Patient still requires increased assist for mobility but is making progress today.  Follow Up Recommendations  SNF           Equipment Recommendations  Rolling walker with 5" wheels;3in1 (PT)       Frequency Min 5X/week   Progress towards PT Goals  Making progress towards PT goals  Plan Current plan remains appropriate    Precautions / Restrictions Precautions Precautions: Back Precaution Comments: educated pt on back precautions Required Braces or Orthoses: Other Brace/Splint (Dr. Christella Noa to order brace for pt) Other Brace/Splint: Dr. Christella Noa ok'd pt to get up to chair without brace per RN report.   Pertinent Vitals/Pain 8/10    Mobility  Bed Mobility Overal bed mobility: Needs Assistance Bed Mobility: Rolling;Sidelying to Sit Rolling: Min assist Sidelying to sit: Mod assist General bed mobility comments: Assist for log roll technique. Incr time due to pain. Transfers Overall transfer level: Needs assistance Equipment used: Rolling walker (2 wheeled) Transfers: Sit to/from Stand Sit to Stand: Mod assist Stand pivot transfers: Min assist;+2 physical assistance General transfer comment: Assist for support with power up from bed and to pivot from bed to chair. Ambulation/Gait Ambulation/Gait assistance: Mod assist Ambulation Distance (Feet): 22 Feet Assistive device: Rolling walker (2 wheeled) Gait Pattern/deviations: Step-to pattern;Decreased stride length;Shuffle;Narrow base of support Gait velocity: decreased General Gait Details: VCs for sequencing     Exercises     PT Diagnosis: Difficulty  walking;Abnormality of gait;Generalized weakness;Acute pain  PT Problem List: Decreased strength;Decreased range of motion;Decreased activity tolerance;Decreased balance;Decreased mobility;Decreased knowledge of use of DME;Obesity;Pain PT Treatment Interventions: DME instruction;Gait training;Stair training;Functional mobility training;Therapeutic activities;Therapeutic exercise;Patient/family education   PT Goals (current goals can now be found in the care plan section) Acute Rehab PT Goals Patient Stated Goal: to be independent PT Goal Formulation: With patient Time For Goal Achievement: 10/03/13 Potential to Achieve Goals: Fair  Visit Information  Last PT Received On: 09/23/13 Assistance Needed: +1 PT/OT/SLP Co-Evaluation/Treatment: Yes History of Present Illness: s/p L4-L5 PLIF    Subjective Data  Subjective: i feel better Patient Stated Goal: to be independent   Cognition  Cognition Arousal/Alertness: Awake/alert Behavior During Therapy: WFL for tasks assessed/performed Overall Cognitive Status: Within Functional Limits for tasks assessed General Comments: improved cognition today, better ability to participate, good carry over of education    Balance   Improved today  End of Session PT - End of Session Equipment Utilized During Treatment: Gait belt Activity Tolerance: Patient limited by pain Patient left: in chair;with call bell/phone within reach Nurse Communication: Mobility status   GP     Duncan Dull 09/23/2013, 10:50 AM Alben Deeds, [place] DPT  [phone]

## 2013-09-23 NOTE — Clinical Social Work Note (Signed)
CSW received call from pt's daughter, Simranjit Thayer, regarding pt's discharge disposition. Per [name], family would like for pt to be placed at [facility] SN[facility]. CSW called [facility] to confirm bed offer. CSW currently working on obtaining level two PASARR for SNF placement. CSW to continue to follow and assist with discharge planning needs.  Pati Gallo, [place] Social Worker [phone]

## 2013-09-24 NOTE — Clinical Social Work Note (Signed)
CSW spoke to pt at bedside. Pt tearful and upset about her discharge disposition. Per 4N Director, pt was upset about whether or not she was able to be admitted to [facility] once medically discharged from [facility]. CSW assured pt that CSW has spoken to [facility] admission coordinator today to confirm that pt is able to be admitted once discharged from [facility]. Pt was appreciative of CSW calling and confirming for pt. Pt also spoke of people "talking about her," but could not tell CSW who it was or where it occurred. Pt kept stating that "they want me to leave." CSW reassured pt that [facility] employees do not want pt to leave until she is medically stable to be discharged. CSW attempted to have pt relax by taking 3 deep breaths. Pt and CSW spoke about pt's family, something cheerful to try and calm pt. Pt became more settled and told CSW that she was appreciative of CSW staying and talking to her while she "calmed down." CSW will come and meet with pt in the morning of 09/25/2013. CSW informed Geophysicist/field seismologist of above conversation.  CSW to continue to follow and assist with discharge planning needs.  Pati Gallo, [place] Social Worker [phone]

## 2013-09-24 NOTE — Progress Notes (Signed)
Patient ID: [name], female   DOB: 08-Apr-1951, 63 y.o.   MRN: [id number] BP 124/84  Pulse 88  Temp(Src) 99.5 F (37.5 C) (Oral)  Resp 20  Ht 5\' 6"  (1.676 m)  Wt 117.119 kg (258 lb 3.2 oz)  BMI 41.69 kg/m2  SpO2 95% Alert and oriented x 4 Will go to [facility] tomorrow Wound is clean, and dry, no signs of infection Moving all extremities well.

## 2013-09-24 NOTE — Progress Notes (Signed)
Physical Therapy Treatment Patient Details Name: [name] MRN: [id number] DOB: 08/09/1951 Today's Date: 09/24/2013 Time: 2774-1287 PT Time Calculation (min): 17 min  PT Assessment / Plan / Recommendation  History of Present Illness s/p L4-L5 PLIF   PT Comments   Patient able to ambulate with less assist today and increased distance despite increased pain and severe spasms.   Follow Up Recommendations  SNF     Does the patient have the potential to tolerate intense rehabilitation     Barriers to Discharge        Equipment Recommendations  Rolling walker with 5" wheels;3in1 (PT)    Recommendations for Other Services    Frequency Min 5X/week   Progress towards PT Goals Progress towards PT goals: Progressing toward goals  Plan Current plan remains appropriate    Precautions / Restrictions Precautions Precautions: Back;Fall Precaution Comments: educated pt on back precautions Required Braces or Orthoses: Spinal Brace Spinal Brace: Lumbar corset;Applied in sitting position Restrictions Weight Bearing Restrictions: No   Pertinent Vitals/Pain 9/10 reported    Mobility  Bed Mobility Overal bed mobility: Needs Assistance Bed Mobility: Rolling;Sidelying to Sit Rolling: Min assist Sidelying to sit: Mod assist Sit to supine: Max assist;+2 for safety/equipment General bed mobility comments: Assist for log roll technique. Incr time due to pain. Transfers Overall transfer level: Needs assistance Equipment used: Rolling walker (2 wheeled) Transfers: Sit to/from Stand Sit to Stand: Mod assist Stand pivot transfers: Min assist General transfer comment: Patient with increased spasm mid transfer x 2 Ambulation/Gait Ambulation/Gait assistance: Min assist Ambulation Distance (Feet): 64 Feet Assistive device: Rolling walker (2 wheeled) Gait Pattern/deviations: Step-through pattern;Decreased stride length;Trunk flexed Gait velocity: decreased General Gait Details: VCs for  sequencing      PT Goals (current goals can now be found in the care plan section) Acute Rehab PT Goals Patient Stated Goal: not stated PT Goal Formulation: With patient Time For Goal Achievement: 10/03/13 Potential to Achieve Goals: Fair  Visit Information  Last PT Received On: 09/24/13 Assistance Needed: +1 History of Present Illness: s/p L4-L5 PLIF    Subjective Data  Subjective: I am not doing better then yesterday Patient Stated Goal: not stated   Cognition  Cognition Arousal/Alertness: Awake/alert Behavior During Therapy: WFL for tasks assessed/performed Overall Cognitive Status: Impaired/Different from baseline Area of Impairment: Following commands;Safety/judgement;Problem solving Following Commands: Follows one step commands inconsistently Safety/Judgement: Decreased awareness of safety Problem Solving: Slow processing;Requires verbal cues;Requires tactile cues General Comments: improving cognition today, some carry over with precautions    Balance   improved  End of Session PT - End of Session Equipment Utilized During Treatment: Gait belt Activity Tolerance: Patient limited by pain Patient left: in chair;with call bell/phone within reach Nurse Communication: Mobility status   GP     Duncan Dull 09/24/2013, 1:25 PM Alben Deeds, [place] DPT  [phone]

## 2013-09-25 MED ORDER — OXYCODONE HCL 5 MG PO TABS: 5.0000 mg | ORAL_TABLET | Freq: Four times a day (QID) | ORAL | Status: DC | PRN

## 2013-09-25 NOTE — Discharge Summary (Signed)
Physician Discharge Summary  Patient ID: [name] MRN: [id number] DOB/AGE: [birth year] 63 y.o.  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 Post op she was very confused and hallucinated. This did not seem to be connected to the pain meds though that has to be a consideration. She had a head ct which was normal. Then quite suddenly she was normal, with a normal neurologic exam. Wound is clean, dry, and without signs of infection. She is tolerating a regular diet, voiding, and ambulating with assistance. She will be discharge to [facility] for rehab.   Consults: None  Significant Diagnostic Studies:  Treatments: surgery: Procedure(s): L4/5 Lumbar decompression beyond what was necessary for a Plif Plif L4/5 with 33mm x 69mm capstone peek cages x2 , morselized local autograft POSTERIORlateral LUMBAR FUSION  L4-5 LEVEL Nonsegmental pedicle screw fixation L4/5 [employer] solera hardware   Discharge Exam: Blood pressure 98/62, pulse 96, temperature 97.4 F (36.3 C), temperature source Oral, resp. rate 18, height 5\' 6"  (1.676 m), weight 117.119 kg (258 lb 3.2 oz), SpO2 97.00%. General appearance: alert, cooperative and appears stated age Neurologic: Alert and oriented X 3, normal strength and tone. Normal symmetric reflexes. Normal coordination and gait  Disposition: 01-Home or Self Care     Medication List         buPROPion 150 MG 24 hr tablet  Commonly known as:  WELLBUTRIN XL  Take 150 mg by mouth  daily.     cholecalciferol 1000 UNITS tablet  Commonly known as:  VITAMIN D  Take 1,000 Units by mouth daily.     escitalopram 10 MG tablet  Commonly known as:  LEXAPRO  Take 20 mg by mouth daily.     esomeprazole 40 MG capsule  Commonly known as:  NEXIUM  Take 40 mg by mouth daily.     esomeprazole 40 MG capsule  Commonly known as:  NEXIUM  Take 40 mg by mouth daily at 12 noon.     hydrOXYzine 25 MG tablet  Commonly known as:  ATARAX/VISTARIL  Take 25 mg by mouth 3 (three) times daily as needed for itching.     IRON PO  Take 325 mg by mouth daily.     lamoTRIgine 200 MG tablet  Commonly known as:  LAMICTAL  Take 200 mg by mouth daily.     levothyroxine 75 MCG tablet  Commonly known as:  SYNTHROID, LEVOTHROID  Take 75 mcg by mouth daily.     oxyCODONE 5 MG immediate release tablet  Commonly known as:  Oxy IR/ROXICODONE  Take 1-2 tablets (5-10 mg total) by mouth every 6 (six) hours as needed for moderate pain or severe pain.     potassium chloride SA 20 MEQ tablet  Commonly known as:  KLOR-CON M20  Take 1 tablet (20 mEq total) by mouth daily.     QUEtiapine 400 MG tablet  Commonly known as:  SEROQUEL  Take 800 mg by mouth at bedtime.     rosuvastatin 10 MG tablet  Commonly known as:  CRESTOR  Take 10 mg by mouth at bedtime.  telmisartan-hydrochlorothiazide 80-25 MG per tablet  Commonly known as:  MICARDIS HCT  Take 1 tablet by mouth daily.           Follow-up Information   Follow up with Haroun Cotham L, MD In 4 weeks. (call to make an appointment)    Specialty:  Neurosurgery   Contact information:   [address], [address]                         [place] [address] [phone]       Signed: Tamotsu Wiederholt L 09/25/2013, 11:23 AM

## 2013-09-25 NOTE — Clinical Social Work Note (Signed)
Discharge paperwork (summary and AVS) have been sent to [facility]. CSW has called pt's daughter, Gerilynn Mccullars, to confirm pt is discharging to [facility] on 09/25/2013. CSW will complete discharge packet and place on pt's shadow chart. CSW will assist with transportation needs of pt (PTAR [phone]).   RN to call report to [facility] SNF: [place], [place] Social Worker [phone]

## 2013-09-25 NOTE — Progress Notes (Signed)
Report called to [facility] staff at 1500. Transferring to rm 310

## 2013-09-25 NOTE — Discharge Instructions (Signed)

## 2013-09-28 ENCOUNTER — Ambulatory Visit: Payer: Medicare Other | Admitting: Internal Medicine

## 2013-09-28 ENCOUNTER — Telehealth: Payer: Self-pay | Admitting: Family Medicine

## 2013-09-28 ENCOUNTER — Other Ambulatory Visit: Payer: Self-pay | Admitting: Internal Medicine

## 2013-09-28 NOTE — Telephone Encounter (Signed)
This patient needs a follow up with [facility].  Please make that appointment.  Thanks!

## 2013-09-28 NOTE — Telephone Encounter (Signed)
Pt states she had back surgery on Jan 9 and doesn't know when she will be able to come in for an appt.. Told pt I will CB in a month to check w/ her again.

## 2013-09-29 NOTE — Telephone Encounter (Signed)
Noted  

## 2013-10-19 ENCOUNTER — Telehealth: Payer: Self-pay | Admitting: Internal Medicine

## 2013-10-19 NOTE — Telephone Encounter (Signed)
sommer from [facility] is calling in regards to pt pain management, pt will do nursing for pain management once a week for two weeks. Pt and ot will do their evaluation, and then aid will go in 2x for 2 weeks. Pt is aware to call for f/u with primary doctor.

## 2013-10-19 NOTE — Telephone Encounter (Signed)
Do I need to do something with this information? 

## 2013-10-20 ENCOUNTER — Telehealth: Payer: Self-pay | Admitting: Internal Medicine

## 2013-10-20 NOTE — Telephone Encounter (Signed)
She just had back surgery and this order should come from her surgeon  Team  Dr Tula Nakayama al.

## 2013-10-20 NOTE — Telephone Encounter (Signed)
Pt decline OT and PT today. Elenore Rota is needing a verbal order for OT and PT evaluation this week.

## 2013-10-20 NOTE — Telephone Encounter (Signed)
Caller: Don/Other; Phone: [phone]; Reason for Call: [name] with Occupational Therapy working with patient today 2/10 and yesterday 2/9, has noticed heartrate at 100 at rest, 104 after walking.   Has had pain in back both days, Hx of Compression Fracture L4, L5.  Was not wearing brace and with brace applied pain rated at 7/10 decreased.  Shoulder pain rated at 6/10 decreased also.  Had been given pain medication by son prior to visit but Rx is not being left in home due to past issues with overdose.  Son planning to give Rx prior to visits.  Plan of care to evaluate BID x1 week, then no therapy next week, then therapy once per week for 2 weeks.  Plans to do ADL training in homemaking, adaptable equipment, upper extremity exercise, transfer training.  If questions, comments, or orders please contact Don at [phone].

## 2013-10-23 ENCOUNTER — Telehealth: Payer: Self-pay | Admitting: Internal Medicine

## 2013-10-23 NOTE — Telephone Encounter (Signed)
Would like to clarify dr Regis Bill will be signing pt's home health, nursing, OT and PT orders. Ok to leave message at the office to confirm

## 2013-10-23 NOTE — Telephone Encounter (Signed)
Left message on Don's personally identified number below that he should contact the pt's surgeon for orders.  Pt has not been seen since surgery.

## 2013-10-23 NOTE — Telephone Encounter (Signed)
Don occupational therapist from [facility] would like a order to have Education officer, museum for pt to be evaluate. Pt son would  Like to look  into getting personal aid service and community resources for his mother

## 2013-10-25 ENCOUNTER — Encounter (HOSPITAL_COMMUNITY): Payer: Self-pay | Admitting: Emergency Medicine

## 2013-10-25 ENCOUNTER — Other Ambulatory Visit: Payer: Self-pay | Admitting: Internal Medicine

## 2013-10-25 ENCOUNTER — Inpatient Hospital Stay (HOSPITAL_COMMUNITY)
Admission: EM | Admit: 2013-10-25 | Discharge: 2013-11-04 | DRG: 371 | Disposition: A | Discharge status: Skilled Nursing Facility | Payer: Medicare Other | Attending: Internal Medicine | Admitting: Internal Medicine

## 2013-10-25 DIAGNOSIS — E559 Vitamin D deficiency, unspecified: Secondary | ICD-10-CM

## 2013-10-25 DIAGNOSIS — I83893 Varicose veins of bilateral lower extremities with other complications: Secondary | ICD-10-CM

## 2013-10-25 DIAGNOSIS — G252 Other specified forms of tremor: Secondary | ICD-10-CM

## 2013-10-25 DIAGNOSIS — R195 Other fecal abnormalities: Secondary | ICD-10-CM | POA: Diagnosis present

## 2013-10-25 DIAGNOSIS — Z96659 Presence of unspecified artificial knee joint: Secondary | ICD-10-CM

## 2013-10-25 DIAGNOSIS — A419 Sepsis, unspecified organism: Secondary | ICD-10-CM | POA: Diagnosis present

## 2013-10-25 DIAGNOSIS — Q762 Congenital spondylolisthesis: Secondary | ICD-10-CM

## 2013-10-25 DIAGNOSIS — G479 Sleep disorder, unspecified: Secondary | ICD-10-CM | POA: Diagnosis present

## 2013-10-25 DIAGNOSIS — F99 Mental disorder, not otherwise specified: Secondary | ICD-10-CM | POA: Diagnosis not present

## 2013-10-25 DIAGNOSIS — I10 Essential (primary) hypertension: Secondary | ICD-10-CM | POA: Diagnosis present

## 2013-10-25 DIAGNOSIS — R4182 Altered mental status, unspecified: Secondary | ICD-10-CM | POA: Diagnosis present

## 2013-10-25 DIAGNOSIS — F319 Bipolar disorder, unspecified: Secondary | ICD-10-CM | POA: Diagnosis present

## 2013-10-25 DIAGNOSIS — Z886 Allergy status to analgesic agent status: Secondary | ICD-10-CM

## 2013-10-25 DIAGNOSIS — M545 Low back pain, unspecified: Secondary | ICD-10-CM

## 2013-10-25 DIAGNOSIS — M48061 Spinal stenosis, lumbar region without neurogenic claudication: Secondary | ICD-10-CM

## 2013-10-25 DIAGNOSIS — A0472 Enterocolitis due to Clostridium difficile, not specified as recurrent: Principal | ICD-10-CM

## 2013-10-25 DIAGNOSIS — Z885 Allergy status to narcotic agent status: Secondary | ICD-10-CM

## 2013-10-25 DIAGNOSIS — Z79899 Other long term (current) drug therapy: Secondary | ICD-10-CM

## 2013-10-25 DIAGNOSIS — Z9181 History of falling: Secondary | ICD-10-CM

## 2013-10-25 DIAGNOSIS — R1319 Other dysphagia: Secondary | ICD-10-CM

## 2013-10-25 DIAGNOSIS — G8929 Other chronic pain: Secondary | ICD-10-CM | POA: Diagnosis present

## 2013-10-25 DIAGNOSIS — E872 Acidosis, unspecified: Secondary | ICD-10-CM | POA: Diagnosis present

## 2013-10-25 DIAGNOSIS — N182 Chronic kidney disease, stage 2 (mild): Secondary | ICD-10-CM | POA: Diagnosis present

## 2013-10-25 DIAGNOSIS — M1710 Unilateral primary osteoarthritis, unspecified knee: Secondary | ICD-10-CM | POA: Diagnosis present

## 2013-10-25 DIAGNOSIS — E785 Hyperlipidemia, unspecified: Secondary | ICD-10-CM | POA: Diagnosis present

## 2013-10-25 DIAGNOSIS — R946 Abnormal results of thyroid function studies: Secondary | ICD-10-CM

## 2013-10-25 DIAGNOSIS — I872 Venous insufficiency (chronic) (peripheral): Secondary | ICD-10-CM

## 2013-10-25 DIAGNOSIS — K5289 Other specified noninfective gastroenteritis and colitis: Secondary | ICD-10-CM

## 2013-10-25 DIAGNOSIS — R197 Diarrhea, unspecified: Secondary | ICD-10-CM

## 2013-10-25 DIAGNOSIS — R269 Unspecified abnormalities of gait and mobility: Secondary | ICD-10-CM

## 2013-10-25 DIAGNOSIS — D649 Anemia, unspecified: Secondary | ICD-10-CM | POA: Diagnosis present

## 2013-10-25 DIAGNOSIS — R6520 Severe sepsis without septic shock: Secondary | ICD-10-CM

## 2013-10-25 DIAGNOSIS — N289 Disorder of kidney and ureter, unspecified: Secondary | ICD-10-CM

## 2013-10-25 DIAGNOSIS — E876 Hypokalemia: Secondary | ICD-10-CM | POA: Diagnosis present

## 2013-10-25 DIAGNOSIS — F329 Major depressive disorder, single episode, unspecified: Secondary | ICD-10-CM | POA: Diagnosis present

## 2013-10-25 DIAGNOSIS — I129 Hypertensive chronic kidney disease with stage 1 through stage 4 chronic kidney disease, or unspecified chronic kidney disease: Secondary | ICD-10-CM | POA: Diagnosis present

## 2013-10-25 DIAGNOSIS — T361X5A Adverse effect of cephalosporins and other beta-lactam antibiotics, initial encounter: Secondary | ICD-10-CM | POA: Diagnosis not present

## 2013-10-25 DIAGNOSIS — R7309 Other abnormal glucose: Secondary | ICD-10-CM

## 2013-10-25 DIAGNOSIS — G250 Essential tremor: Secondary | ICD-10-CM

## 2013-10-25 DIAGNOSIS — R413 Other amnesia: Secondary | ICD-10-CM | POA: Diagnosis present

## 2013-10-25 DIAGNOSIS — M4316 Spondylolisthesis, lumbar region: Secondary | ICD-10-CM | POA: Diagnosis present

## 2013-10-25 DIAGNOSIS — N390 Urinary tract infection, site not specified: Secondary | ICD-10-CM | POA: Diagnosis present

## 2013-10-25 DIAGNOSIS — M549 Dorsalgia, unspecified: Secondary | ICD-10-CM | POA: Diagnosis present

## 2013-10-25 DIAGNOSIS — R609 Edema, unspecified: Secondary | ICD-10-CM | POA: Diagnosis not present

## 2013-10-25 DIAGNOSIS — R296 Repeated falls: Secondary | ICD-10-CM

## 2013-10-25 DIAGNOSIS — E861 Hypovolemia: Secondary | ICD-10-CM | POA: Diagnosis present

## 2013-10-25 DIAGNOSIS — T3795XA Adverse effect of unspecified systemic anti-infective and antiparasitic, initial encounter: Secondary | ICD-10-CM | POA: Diagnosis not present

## 2013-10-25 DIAGNOSIS — Z0000 Encounter for general adult medical examination without abnormal findings: Secondary | ICD-10-CM

## 2013-10-25 DIAGNOSIS — M79609 Pain in unspecified limb: Secondary | ICD-10-CM

## 2013-10-25 DIAGNOSIS — E440 Moderate protein-calorie malnutrition: Secondary | ICD-10-CM | POA: Diagnosis present

## 2013-10-25 DIAGNOSIS — E039 Hypothyroidism, unspecified: Secondary | ICD-10-CM | POA: Diagnosis present

## 2013-10-25 DIAGNOSIS — K5730 Diverticulosis of large intestine without perforation or abscess without bleeding: Secondary | ICD-10-CM

## 2013-10-25 DIAGNOSIS — A020 Salmonella enteritis: Secondary | ICD-10-CM

## 2013-10-25 DIAGNOSIS — B370 Candidal stomatitis: Secondary | ICD-10-CM

## 2013-10-25 DIAGNOSIS — A414 Sepsis due to anaerobes: Secondary | ICD-10-CM | POA: Diagnosis present

## 2013-10-25 DIAGNOSIS — I498 Other specified cardiac arrhythmias: Secondary | ICD-10-CM | POA: Diagnosis not present

## 2013-10-25 DIAGNOSIS — E870 Hyperosmolality and hypernatremia: Secondary | ICD-10-CM | POA: Diagnosis present

## 2013-10-25 DIAGNOSIS — R748 Abnormal levels of other serum enzymes: Secondary | ICD-10-CM

## 2013-10-25 DIAGNOSIS — Z86010 Personal history of colonic polyps: Secondary | ICD-10-CM

## 2013-10-25 DIAGNOSIS — R1031 Right lower quadrant pain: Secondary | ICD-10-CM | POA: Diagnosis present

## 2013-10-25 DIAGNOSIS — M1990 Unspecified osteoarthritis, unspecified site: Secondary | ICD-10-CM

## 2013-10-25 DIAGNOSIS — K219 Gastro-esophageal reflux disease without esophagitis: Secondary | ICD-10-CM | POA: Diagnosis present

## 2013-10-25 DIAGNOSIS — F411 Generalized anxiety disorder: Secondary | ICD-10-CM | POA: Diagnosis present

## 2013-10-25 DIAGNOSIS — F3289 Other specified depressive episodes: Secondary | ICD-10-CM | POA: Diagnosis present

## 2013-10-25 DIAGNOSIS — E669 Obesity, unspecified: Secondary | ICD-10-CM | POA: Diagnosis present

## 2013-10-25 DIAGNOSIS — M8580 Other specified disorders of bone density and structure, unspecified site: Secondary | ICD-10-CM

## 2013-10-25 DIAGNOSIS — R635 Abnormal weight gain: Secondary | ICD-10-CM

## 2013-10-25 DIAGNOSIS — N179 Acute kidney failure, unspecified: Secondary | ICD-10-CM | POA: Diagnosis present

## 2013-10-25 DIAGNOSIS — Z6841 Body Mass Index (BMI) 40.0 and over, adult: Secondary | ICD-10-CM

## 2013-10-25 DIAGNOSIS — G9341 Metabolic encephalopathy: Secondary | ICD-10-CM | POA: Diagnosis present

## 2013-10-25 LAB — URINALYSIS, ROUTINE W REFLEX MICROSCOPIC
Glucose, UA: NEGATIVE mg/dL
HGB URINE DIPSTICK: NEGATIVE
KETONES UR: NEGATIVE mg/dL
Nitrite: NEGATIVE
PROTEIN: 100 mg/dL — AB
Specific Gravity, Urine: 1.028 (ref 1.005–1.030)
Urobilinogen, UA: 1 mg/dL (ref 0.0–1.0)
pH: 5 (ref 5.0–8.0)

## 2013-10-25 LAB — CBC WITH DIFFERENTIAL/PLATELET
BASOS PCT: 2 % — AB (ref 0–1)
Basophils Absolute: 0.4 10*3/uL — ABNORMAL HIGH (ref 0.0–0.1)
EOS PCT: 0 % (ref 0–5)
Eosinophils Absolute: 0 10*3/uL (ref 0.0–0.7)
HCT: 33.5 % — ABNORMAL LOW (ref 36.0–46.0)
Hemoglobin: 12 g/dL (ref 12.0–15.0)
Lymphocytes Relative: 23 % (ref 12–46)
Lymphs Abs: 4.8 10*3/uL — ABNORMAL HIGH (ref 0.7–4.0)
MCH: 30.6 pg (ref 26.0–34.0)
MCHC: 35.8 g/dL (ref 30.0–36.0)
MCV: 85.5 fL (ref 78.0–100.0)
Monocytes Absolute: 0.6 10*3/uL (ref 0.1–1.0)
Monocytes Relative: 3 % (ref 3–12)
NEUTROS ABS: 15.2 10*3/uL — AB (ref 1.7–7.7)
NRBC: 1 /100{WBCs} — AB
Neutrophils Relative %: 72 % (ref 43–77)
Platelets: 231 10*3/uL (ref 150–400)
RBC: 3.92 MIL/uL (ref 3.87–5.11)
RDW: 14.2 % (ref 11.5–15.5)
WBC: 21 10*3/uL — ABNORMAL HIGH (ref 4.0–10.5)

## 2013-10-25 LAB — URINE MICROSCOPIC-ADD ON

## 2013-10-25 LAB — OCCULT BLOOD, POC DEVICE: Fecal Occult Bld: NEGATIVE

## 2013-10-25 MED ORDER — SODIUM CHLORIDE 0.9 % IV SOLN
INTRAVENOUS | Status: AC
  Administered 2013-10-25: via INTRAVENOUS

## 2013-10-25 MED ORDER — IOHEXOL 300 MG/ML  SOLN: 50.0000 mL | Freq: Once | INTRAMUSCULAR | Status: AC | PRN

## 2013-10-25 MED ORDER — SODIUM CHLORIDE 0.9 % IV BOLUS (SEPSIS)
1000.0000 mL | Freq: Once | INTRAVENOUS | Status: AC
  Administered 2013-10-25: 1000 mL via INTRAVENOUS

## 2013-10-25 NOTE — ED Notes (Signed)
Unable to obtain blood work with IV insertion. Phlebotomist at bedside now.
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
## 2013-10-25 NOTE — ED Notes (Signed)
Bed: WA23 Expected date:  Expected time:  Means of arrival:  Comments: EMS 

## 2013-10-25 NOTE — ED Notes (Signed)
Per EMS: Family called tonight saying pt has been having black tarry stools x 1 wk. Pt also "acting off" per family, thinks it may be d/t oxycodone. Pt taking oxycodone because of back surgery early January. Pt is poor historian, but alert to person, place and time. Pt lives by herself. Only pain pt reports is in back. Family states pt tried to eat apple and threw up. Family also reports pt has psyc history.

## 2013-10-26 ENCOUNTER — Inpatient Hospital Stay (HOSPITAL_COMMUNITY): Payer: Medicare Other

## 2013-10-26 ENCOUNTER — Encounter (HOSPITAL_COMMUNITY): Payer: Self-pay | Admitting: Radiology

## 2013-10-26 DIAGNOSIS — R4182 Altered mental status, unspecified: Secondary | ICD-10-CM

## 2013-10-26 DIAGNOSIS — E669 Obesity, unspecified: Secondary | ICD-10-CM | POA: Diagnosis present

## 2013-10-26 DIAGNOSIS — N179 Acute kidney failure, unspecified: Secondary | ICD-10-CM

## 2013-10-26 DIAGNOSIS — F3289 Other specified depressive episodes: Secondary | ICD-10-CM

## 2013-10-26 DIAGNOSIS — I10 Essential (primary) hypertension: Secondary | ICD-10-CM

## 2013-10-26 DIAGNOSIS — R197 Diarrhea, unspecified: Secondary | ICD-10-CM

## 2013-10-26 DIAGNOSIS — R1031 Right lower quadrant pain: Secondary | ICD-10-CM | POA: Diagnosis present

## 2013-10-26 DIAGNOSIS — A0472 Enterocolitis due to Clostridium difficile, not specified as recurrent: Principal | ICD-10-CM

## 2013-10-26 DIAGNOSIS — K5289 Other specified noninfective gastroenteritis and colitis: Secondary | ICD-10-CM

## 2013-10-26 DIAGNOSIS — E785 Hyperlipidemia, unspecified: Secondary | ICD-10-CM

## 2013-10-26 DIAGNOSIS — E039 Hypothyroidism, unspecified: Secondary | ICD-10-CM

## 2013-10-26 DIAGNOSIS — F329 Major depressive disorder, single episode, unspecified: Secondary | ICD-10-CM

## 2013-10-26 LAB — CBC
HCT: 33.1 % — ABNORMAL LOW (ref 36.0–46.0)
HEMOGLOBIN: 11.4 g/dL — AB (ref 12.0–15.0)
MCH: 29.6 pg (ref 26.0–34.0)
MCHC: 34.4 g/dL (ref 30.0–36.0)
MCV: 86 fL (ref 78.0–100.0)
Platelets: 272 10*3/uL (ref 150–400)
RBC: 3.85 MIL/uL — AB (ref 3.87–5.11)
RDW: 14.5 % (ref 11.5–15.5)
WBC: 23.3 10*3/uL — ABNORMAL HIGH (ref 4.0–10.5)

## 2013-10-26 LAB — COMPREHENSIVE METABOLIC PANEL
ALT: 23 U/L (ref 0–35)
AST: 42 U/L — AB (ref 0–37)
Albumin: 2.5 g/dL — ABNORMAL LOW (ref 3.5–5.2)
Alkaline Phosphatase: 97 U/L (ref 39–117)
BILIRUBIN TOTAL: 0.7 mg/dL (ref 0.3–1.2)
BUN: 52 mg/dL — AB (ref 6–23)
CHLORIDE: 95 meq/L — AB (ref 96–112)
CO2: 19 meq/L (ref 19–32)
Calcium: 8.8 mg/dL (ref 8.4–10.5)
Creatinine, Ser: 2.94 mg/dL — ABNORMAL HIGH (ref 0.50–1.10)
GFR calc Af Amer: 19 mL/min — ABNORMAL LOW (ref >90)
GFR, EST NON AFRICAN AMERICAN: 16 mL/min — AB (ref >90)
Glucose, Bld: 134 mg/dL — ABNORMAL HIGH (ref 70–99)
Potassium: 4 mEq/L (ref 3.7–5.3)
Sodium: 135 mEq/L — ABNORMAL LOW (ref 137–147)
Total Protein: 6.3 g/dL (ref 6.0–8.3)

## 2013-10-26 LAB — CLOSTRIDIUM DIFFICILE BY PCR: CDIFFPCR: POSITIVE — AB

## 2013-10-26 LAB — GI PATHOGEN PANEL BY PCR, STOOL
C difficile toxin A/B: POSITIVE
CAMPYLOBACTER BY PCR: NEGATIVE
CRYPTOSPORIDIUM BY PCR: NEGATIVE
E COLI (ETEC) LT/ST: NEGATIVE
E COLI (STEC): NEGATIVE
E coli 0157 by PCR: NEGATIVE
G lamblia by PCR: NEGATIVE
NOROVIRUS G1/G2: NEGATIVE
Rotavirus A by PCR: NEGATIVE
SHIGELLA BY PCR: NEGATIVE
Salmonella by PCR: POSITIVE

## 2013-10-26 LAB — BASIC METABOLIC PANEL
BUN: 52 mg/dL — ABNORMAL HIGH (ref 6–23)
CALCIUM: 8.3 mg/dL — AB (ref 8.4–10.5)
CO2: 18 mEq/L — ABNORMAL LOW (ref 19–32)
Chloride: 97 mEq/L (ref 96–112)
Creatinine, Ser: 2.8 mg/dL — ABNORMAL HIGH (ref 0.50–1.10)
GFR, EST AFRICAN AMERICAN: 20 mL/min — AB (ref >90)
GFR, EST NON AFRICAN AMERICAN: 17 mL/min — AB (ref >90)
Glucose, Bld: 138 mg/dL — ABNORMAL HIGH (ref 70–99)
POTASSIUM: 4 meq/L (ref 3.7–5.3)
SODIUM: 135 meq/L — AB (ref 137–147)

## 2013-10-26 LAB — SODIUM, URINE, RANDOM: Sodium, Ur: 11 mEq/L

## 2013-10-26 LAB — CREATININE, URINE, RANDOM: CREATININE, URINE: 445.7 mg/dL

## 2013-10-26 LAB — LACTIC ACID, PLASMA: Lactic Acid, Venous: 2.5 mmol/L — ABNORMAL HIGH (ref 0.5–2.2)

## 2013-10-26 MED ORDER — VITAMIN D3 25 MCG (1000 UNIT) PO TABS
1000.0000 [IU] | ORAL_TABLET | Freq: Every day | ORAL | Status: DC
  Administered 2013-10-26 – 2013-11-04 (×6): 1000 [IU] via ORAL
  Filled 2013-10-26 (×10): qty 1

## 2013-10-26 MED ORDER — FENTANYL CITRATE 0.05 MG/ML IJ SOLN
12.5000 ug | Freq: Four times a day (QID) | INTRAMUSCULAR | Status: DC | PRN
  Administered 2013-10-26: 12.5 ug via INTRAVENOUS
  Filled 2013-10-26: qty 2

## 2013-10-26 MED ORDER — METRONIDAZOLE IN NACL 5-0.79 MG/ML-% IV SOLN
500.0000 mg | Freq: Three times a day (TID) | INTRAVENOUS | Status: DC
  Administered 2013-10-26 – 2013-11-04 (×29): 500 mg via INTRAVENOUS
  Filled 2013-10-26 (×31): qty 100

## 2013-10-26 MED ORDER — ACETAMINOPHEN 325 MG PO TABS
650.0000 mg | ORAL_TABLET | Freq: Four times a day (QID) | ORAL | Status: DC | PRN
  Administered 2013-11-02: 650 mg via ORAL
  Filled 2013-10-26: qty 2

## 2013-10-26 MED ORDER — ATORVASTATIN CALCIUM 10 MG PO TABS
10.0000 mg | ORAL_TABLET | Freq: Every day | ORAL | Status: DC
  Administered 2013-10-26: 10 mg via ORAL
  Filled 2013-10-26 (×3): qty 1

## 2013-10-26 MED ORDER — VANCOMYCIN 50 MG/ML ORAL SOLUTION
125.0000 mg | Freq: Four times a day (QID) | ORAL | Status: DC
  Administered 2013-10-26 – 2013-10-27 (×3): 125 mg via ORAL
  Filled 2013-10-26 (×6): qty 2.5

## 2013-10-26 MED ORDER — LEVOTHYROXINE SODIUM 75 MCG PO TABS
75.0000 ug | ORAL_TABLET | Freq: Every day | ORAL | Status: DC
  Administered 2013-10-26 – 2013-10-28 (×3): 75 ug via ORAL
  Filled 2013-10-26 (×4): qty 1

## 2013-10-26 MED ORDER — SODIUM CHLORIDE 0.9 % IV SOLN
INTRAVENOUS | Status: DC
  Administered 2013-10-26: 1000 mL via INTRAVENOUS
  Administered 2013-10-27: 08:00:00 via INTRAVENOUS
  Administered 2013-10-28: 1000 mL via INTRAVENOUS
  Administered 2013-10-28 – 2013-10-30 (×5): via INTRAVENOUS
  Administered 2013-10-31 (×2): 125 mL via INTRAVENOUS

## 2013-10-26 MED ORDER — PANTOPRAZOLE SODIUM 40 MG IV SOLR
40.0000 mg | Freq: Two times a day (BID) | INTRAVENOUS | Status: DC
  Administered 2013-10-26 – 2013-10-27 (×3): 40 mg via INTRAVENOUS
  Filled 2013-10-26 (×6): qty 40

## 2013-10-26 MED ORDER — SODIUM CHLORIDE 0.9 % IV BOLUS (SEPSIS)
1000.0000 mL | Freq: Once | INTRAVENOUS | Status: AC
Start: 2013-10-26 — End: 2013-10-26
  Administered 2013-10-26: 1000 mL via INTRAVENOUS

## 2013-10-26 MED ORDER — FERROUS SULFATE 325 (65 FE) MG PO TABS
325.0000 mg | ORAL_TABLET | Freq: Every day | ORAL | Status: DC
  Administered 2013-10-26 – 2013-10-27 (×2): 325 mg via ORAL
  Filled 2013-10-26 (×4): qty 1

## 2013-10-26 MED ORDER — ACETAMINOPHEN 650 MG RE SUPP
650.0000 mg | Freq: Four times a day (QID) | RECTAL | Status: DC | PRN
  Administered 2013-10-31: 650 mg via RECTAL
  Filled 2013-10-26: qty 1

## 2013-10-26 MED ORDER — ESCITALOPRAM OXALATE 20 MG PO TABS: 20.0000 mg | ORAL_TABLET | Freq: Every day | ORAL | Status: DC

## 2013-10-26 MED ORDER — HEPARIN SODIUM (PORCINE) 5000 UNIT/ML IJ SOLN
5000.0000 [IU] | Freq: Three times a day (TID) | INTRAMUSCULAR | Status: DC
  Administered 2013-10-26 – 2013-11-04 (×29): 5000 [IU] via SUBCUTANEOUS
  Filled 2013-10-26 (×31): qty 1

## 2013-10-26 MED ORDER — IOHEXOL 300 MG/ML  SOLN: 50.0000 mL | Freq: Once | INTRAMUSCULAR | Status: AC | PRN

## 2013-10-26 MED ORDER — POTASSIUM CHLORIDE CRYS ER 20 MEQ PO TBCR
20.0000 meq | EXTENDED_RELEASE_TABLET | Freq: Every day | ORAL | Status: DC
  Administered 2013-10-26 – 2013-10-28 (×3): 20 meq via ORAL
  Filled 2013-10-26 (×5): qty 1

## 2013-10-26 MED ORDER — FENTANYL CITRATE 0.05 MG/ML IJ SOLN
25.0000 ug | Freq: Four times a day (QID) | INTRAMUSCULAR | Status: DC | PRN
  Administered 2013-10-26 – 2013-10-28 (×4): 25 ug via INTRAVENOUS
  Filled 2013-10-26 (×4): qty 2

## 2013-10-26 MED ORDER — PANTOPRAZOLE SODIUM 40 MG PO TBEC
40.0000 mg | DELAYED_RELEASE_TABLET | Freq: Every day | ORAL | Status: DC
  Administered 2013-10-26: 40 mg via ORAL
  Filled 2013-10-26: qty 1

## 2013-10-26 MED ORDER — BUPROPION HCL ER (XL) 150 MG PO TB24: 150.0000 mg | ORAL_TABLET | Freq: Every day | ORAL | Status: DC

## 2013-10-26 MED ORDER — CIPROFLOXACIN IN D5W 400 MG/200ML IV SOLN
400.0000 mg | INTRAVENOUS | Status: DC
  Administered 2013-10-26 – 2013-10-28 (×3): 400 mg via INTRAVENOUS
  Filled 2013-10-26 (×3): qty 200

## 2013-10-26 MED ORDER — LAMOTRIGINE 200 MG PO TABS
200.0000 mg | ORAL_TABLET | Freq: Every day | ORAL | Status: DC
  Administered 2013-10-26 – 2013-11-04 (×6): 200 mg via ORAL
  Filled 2013-10-26 (×10): qty 1

## 2013-10-26 MED ORDER — PROCHLORPERAZINE EDISYLATE 5 MG/ML IJ SOLN
5.0000 mg | Freq: Once | INTRAMUSCULAR | Status: AC
  Administered 2013-10-26: 5 mg via INTRAVENOUS
  Filled 2013-10-26: qty 2

## 2013-10-26 MED ORDER — SODIUM CHLORIDE 0.9 % IV SOLN
INTRAVENOUS | Status: DC
  Administered 2013-10-26 (×2): via INTRAVENOUS

## 2013-10-26 MED ORDER — ONDANSETRON HCL 4 MG/2ML IJ SOLN
4.0000 mg | Freq: Four times a day (QID) | INTRAMUSCULAR | Status: DC | PRN
  Administered 2013-10-26 – 2013-11-04 (×7): 4 mg via INTRAVENOUS
  Filled 2013-10-26 (×8): qty 2

## 2013-10-26 NOTE — Progress Notes (Signed)
This RN assisted patient to bedside commode and afterwards, patient complained of short of breath and pain. MD paged with vitals 100% O2 on 3 Liters, tachycardic, BP 108/92. On-call paged for orders
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
## 2013-10-26 NOTE — Telephone Encounter (Signed)
Tried calling the below listed number.  Received a message that it is no longer in service.

## 2013-10-26 NOTE — ED Notes (Signed)
Attempted lab draw for lactic acid X 2 but unsuccessful. Hospitalist states when pt. goes upstairs labs can be drawn, pt. Vomiting at this time.

## 2013-10-26 NOTE — Progress Notes (Signed)
CRITICAL VALUE ALERT  Critical value received:  c-diff positive  Date of notification:  10/26/2013  Time of notification:  1150  Critical value read back: yes  Nurse who received alert:  Charlsie Merles  MD notified (1st page):  Dr Sherral Hammers  Time of first page:  1151  MD notified (2nd page): Dr Sherral Hammers  Time of second page: 1210  Responding MD:  Sherral Hammers  Time MD responded:  1215
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
## 2013-10-26 NOTE — Telephone Encounter (Signed)
Spoke to [place].  He has been notified that any orders will need to come from the pt's surgeon or specialist.  She has not been evaluated for this problem in the office per [facility].

## 2013-10-26 NOTE — Progress Notes (Signed)
[facility] PROGRESS NOTE  Sydney OSTENSON [id number] DOB: 1950/11/28 DOA: 10/25/2013 PCP: [name], MD  Assessment/Plan:  Right lower quadrant pain  Patient reports 2 days of right lower quadrant pain radiating to the flank. She has tenderness over right lower quadrant and right CVA tenderness. Will check CT scan of the abdomen and pelvis without contrast.  -Patient also reports diarrhea for past 3-4 days. As per son patient was given a course of antibiotic for UTI in the skilled nursing facility 2 weeks back. Check stool for c diff.  -Check lactic acid level. Monitor serial abdominal exam.  - Continue Cipro and Flagyl.   Altered mental status  Patient reportedly more confused for past few days and sleepy. Son feels she was taking more oxycodone than usual. Will hold oxycodone. (She has oxycodone listed as allergy in the form of confusion)  -check head CT.  -Continue neuro checks  -Will place her on when necessary fentanyl for her pain symptoms.  -As she is on Wellbutrin, Lexapro for depression. She also takes an extremely high dose of Seroquel at bedtime for? Sleep disorder which I will hold for now until her mental status is improved.   Hypertension  Blood pressure stable. Cord ARB and HCTZ given acute kidney injury.   GERD  Continue PPI  Anemia  Patient reports passing dark stools for the last few days. Stool for occult blood was negative. Hemoglobin stable   Unspecified hypothyroidism  Continue Synthroid. Check TSH   Spondylolisthesis of lumbar region  Status post lumbar decompression surgery for L4/L5 lumbar spondylosis and L4/L5 lateral recess stenosis and January 2015 by Dr. Christella Noa.   Colitis - Continue ciprofloxacin+ metronidazole. - If no improvement in the a.m. clinically, spiking fever or increasing leukocytosis consult surgery   Clostridium difficile -10/26/2013 C. difficile positive by PCR -Start antibiotic protocol (patient's WBC> 15 which  places him severe category) -Stool culture pending   Acute renal failure -Bolus 1 L of saline, then normal saline at 1107ml/hr            Code Status: Full Family Communication:  Disposition Plan: Resolution colitis/C. difficile   Consultants:     Procedures: CT abdomen pelvis without contrast 10/26/2013 Inflammatory changes involving the cecum and ascending colon are  noted. Differential diagnosis includes infectious colitis, ischemia,  acute appendicitis, and typhlitis. There is no pneumatosis. No  abscess. No extraluminal bowel gas.     Antibiotics: Ciprofloxacin 2/16>> Metronidazole 2/16>> Vancomycin 2/16>>    HPI/Subjective: 62-yo BF PMHx depression, memory loss, frequent falls, hypertension, hyperlipidemia, depression, GERD, hypothyroidism,with history of lumbar spondylosis status post lumbar decompression surgery in January 2015, recently treated with antibiotic for UTI was brought in by family as patient complained of having loose / watery diarrhea of 3-4 episodes for past few days. Patient is confused during conversation she was primarily provided by her son at bedside. Patient was discharged to skilled nursing facility about 10 days back. For past 2 days she has been complaining of stools and right lower quadrant pain radiating to the back. Patient also has been noticed to be more confused and sleepy for past 2 days and her son feels that she may be taking some extra dose of oxycodone. Patient also informs some nausea and vomiting but is unable to explain further. She did not have headache, dizziness, dysuria, weakness, syncope, muscle aches or joint pains. 2/16 patient in moderate amount of pain, positive N/V.   Objective: Filed Vitals:   10/25/13 2009 10/26/13 0021 10/26/13  0040 10/26/13 0555  BP: 106/62  90/66 101/75  Pulse: 96  100 109  Temp: 97.8 F (36.6 C)  98.2 F (36.8 C) 97.1 F (36.2 C)  TempSrc: Oral  Axillary Axillary  Resp: 20  22 24    Height:  5\' 6"  (1.676 m) 5\' 6"  (1.676 m)   Weight:  117.1 kg (258 lb 2.5 oz) 107 kg (235 lb 14.3 oz)   SpO2: 100%  98% 100%    Intake/Output Summary (Last 24 hours) at 10/26/13 0820 Last data filed at 10/26/13 9381  Gross per 24 hour  Intake 863.33 ml  Output      4 ml  Net 859.33 ml   Filed Weights   10/26/13 0021 10/26/13 0040  Weight: 117.1 kg (258 lb 2.5 oz) 107 kg (235 lb 14.3 oz)    Exam:   General:  A./O. x4, moderate abdominal pain   Cardiovascular: Tachycardic, negative murmurs rubs or gallops  Respiratory: Clear to auscultation bilateral  Abdomen: Epigastric/left lower quadrant moderate pain to palpation  Musculoskeletal: Negative pedal edema   Data Reviewed: Basic Metabolic Panel:  Recent Labs Lab 10/25/13 2039 10/26/13 0553  NA 135* 135*  K 4.0 4.0  CL 95* 97  CO2 19 18*  GLUCOSE 134* 138*  BUN 52* 52*  CREATININE 2.94* 2.80*  CALCIUM 8.8 8.3*   Liver Function Tests:  Recent Labs Lab 10/25/13 2039  AST 42*  ALT 23  ALKPHOS 97  BILITOT 0.7  PROT 6.3  ALBUMIN 2.5*   No results found for this basename: LIPASE, AMYLASE,  in the last 168 hours No results found for this basename: AMMONIA,  in the last 168 hours CBC:  Recent Labs Lab 10/25/13 2039 10/26/13 0553  WBC 21.0* 23.3*  NEUTROABS 15.2*  --   HGB 12.0 11.4*  HCT 33.5* 33.1*  MCV 85.5 86.0  PLT 231 272   Cardiac Enzymes: No results found for this basename: CKTOTAL, CKMB, CKMBINDEX, TROPONINI,  in the last 168 hours BNP (last 3 results) No results found for this basename: PROBNP,  in the last 8760 hours CBG: No results found for this basename: GLUCAP,  in the last 168 hours  No results found for this or any previous visit (from the past 240 hour(s)).   Studies: Ct Head Wo Contrast  10/26/2013   CLINICAL DATA:  Altered mental status  EXAM: CT HEAD WITHOUT CONTRAST  TECHNIQUE: Contiguous axial images were obtained from the base of the skull through the vertex without  intravenous contrast.  COMPARISON:  Prior CT from 09/21/2013  FINDINGS: Mild chronic microvascular ischemic disease is unchanged. There is no acute intracranial hemorrhage or infarct. No mass lesion or midline shift. Gray-white matter differentiation is well maintained. Ventricles are normal in size without evidence of hydrocephalus. CSF containing spaces are within normal limits. No extra-axial fluid collection.  The calvarium is intact.  Orbital soft tissues are within normal limits.  The paranasal sinuses and mastoid air cells are well pneumatized and free of fluid.  Scalp soft tissues are unremarkable.  IMPRESSION: 1. No acute intracranial abnormality. 2. Mild chronic microvascular ischemic changes, stable.   Electronically Signed   By: Jeannine Boga M.D.   On: 10/26/2013 02:23    Scheduled Meds: . sodium chloride   Intravenous STAT  . atorvastatin  10 mg Oral q1800  . cholecalciferol  1,000 Units Oral Daily  . ciprofloxacin  400 mg Intravenous Q24H  . ferrous sulfate  325 mg Oral QPC breakfast  . heparin  5,000 Units Subcutaneous 3 times per day  . lamoTRIgine  200 mg Oral Daily  . levothyroxine  75 mcg Oral QAC breakfast  . metronidazole  500 mg Intravenous Q8H  . pantoprazole  40 mg Oral Daily  . potassium chloride SA  20 mEq Oral Daily   Continuous Infusions: . sodium chloride 100 mL/hr at 10/26/13 0045    Principal Problem:   Acute renal failure Active Problems:   HYPERTENSION   ANEMIA   Unspecified hypothyroidism   Spondylolisthesis of lumbar region   Obesity   Right lower quadrant abdominal pain   Altered mental status   Acute kidney injury    Time spent: 40 minutes   Hilery Wintle, J  [facility] Pager [phone]. If 7PM-7AM, please contact night-coverage at [email or web address], password [facility] 10/26/2013, 8:20 AM  LOS: 1 day
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
## 2013-10-26 NOTE — Progress Notes (Signed)
RN asked RT to come and see patient who was having labored breathing. RN had just got patient back to bed from the [facility]. RT placed 2 liters of oxygen on patient due to pt symptoms. Patients oxygen sats were 100 % on room air and pulse rate was 104. RN to call MD.

## 2013-10-26 NOTE — ED Notes (Signed)
Hospitalist at bedside 

## 2013-10-27 ENCOUNTER — Inpatient Hospital Stay (HOSPITAL_COMMUNITY): Payer: Medicare Other

## 2013-10-27 DIAGNOSIS — N179 Acute kidney failure, unspecified: Secondary | ICD-10-CM

## 2013-10-27 DIAGNOSIS — D649 Anemia, unspecified: Secondary | ICD-10-CM

## 2013-10-27 DIAGNOSIS — M4310 Spondylolisthesis, site unspecified: Secondary | ICD-10-CM

## 2013-10-27 DIAGNOSIS — A0472 Enterocolitis due to Clostridium difficile, not specified as recurrent: Secondary | ICD-10-CM

## 2013-10-27 DIAGNOSIS — R6520 Severe sepsis without septic shock: Secondary | ICD-10-CM

## 2013-10-27 DIAGNOSIS — A419 Sepsis, unspecified organism: Secondary | ICD-10-CM

## 2013-10-27 LAB — CBC WITH DIFFERENTIAL/PLATELET
Basophils Absolute: 0 10*3/uL (ref 0.0–0.1)
Basophils Relative: 0 % (ref 0–1)
EOS PCT: 0 % (ref 0–5)
Eosinophils Absolute: 0 10*3/uL (ref 0.0–0.7)
HCT: 35.2 % — ABNORMAL LOW (ref 36.0–46.0)
HEMOGLOBIN: 12.3 g/dL (ref 12.0–15.0)
LYMPHS PCT: 12 % (ref 12–46)
Lymphs Abs: 4.8 10*3/uL — ABNORMAL HIGH (ref 0.7–4.0)
MCH: 29.9 pg (ref 26.0–34.0)
MCHC: 34.9 g/dL (ref 30.0–36.0)
MCV: 85.4 fL (ref 78.0–100.0)
MONO ABS: 3.2 10*3/uL — AB (ref 0.1–1.0)
MONOS PCT: 8 % (ref 3–12)
NEUTROS PCT: 80 % — AB (ref 43–77)
Neutro Abs: 31.6 10*3/uL — ABNORMAL HIGH (ref 1.7–7.7)
PLATELETS: 251 10*3/uL (ref 150–400)
RBC: 4.12 MIL/uL (ref 3.87–5.11)
RDW: 14.7 % (ref 11.5–15.5)
WBC: 39.6 10*3/uL — AB (ref 4.0–10.5)

## 2013-10-27 LAB — COMPREHENSIVE METABOLIC PANEL
ALBUMIN: 2 g/dL — AB (ref 3.5–5.2)
ALK PHOS: 91 U/L (ref 39–117)
ALT: 21 U/L (ref 0–35)
ALT: 20 U/L (ref 0–35)
ALT: 19 U/L (ref 0–35)
AST: 33 U/L (ref 0–37)
AST: 36 U/L (ref 0–37)
AST: 37 U/L (ref 0–37)
Albumin: 2 g/dL — ABNORMAL LOW (ref 3.5–5.2)
Albumin: 2 g/dL — ABNORMAL LOW (ref 3.5–5.2)
Alkaline Phosphatase: 91 U/L (ref 39–117)
Alkaline Phosphatase: 82 U/L (ref 39–117)
BILIRUBIN TOTAL: 0.6 mg/dL (ref 0.3–1.2)
BILIRUBIN TOTAL: 0.6 mg/dL (ref 0.3–1.2)
BUN: 60 mg/dL — AB (ref 6–23)
BUN: 64 mg/dL — AB (ref 6–23)
BUN: 63 mg/dL — ABNORMAL HIGH (ref 6–23)
CALCIUM: 7.6 mg/dL — AB (ref 8.4–10.5)
CHLORIDE: 100 meq/L (ref 96–112)
CHLORIDE: 104 meq/L (ref 96–112)
CHLORIDE: 104 meq/L (ref 96–112)
CO2: 16 meq/L — AB (ref 19–32)
CO2: 13 mEq/L — ABNORMAL LOW (ref 19–32)
CO2: 15 mEq/L — ABNORMAL LOW (ref 19–32)
CREATININE: 2.91 mg/dL — AB (ref 0.50–1.10)
CREATININE: 3.02 mg/dL — AB (ref 0.50–1.10)
Calcium: 7.6 mg/dL — ABNORMAL LOW (ref 8.4–10.5)
Calcium: 7.4 mg/dL — ABNORMAL LOW (ref 8.4–10.5)
Creatinine, Ser: 2.94 mg/dL — ABNORMAL HIGH (ref 0.50–1.10)
GFR calc Af Amer: 19 mL/min — ABNORMAL LOW (ref >90)
GFR calc Af Amer: 18 mL/min — ABNORMAL LOW (ref >90)
GFR calc non Af Amer: 16 mL/min — ABNORMAL LOW (ref >90)
GFR calc non Af Amer: 16 mL/min — ABNORMAL LOW (ref >90)
GFR, EST AFRICAN AMERICAN: 19 mL/min — AB (ref >90)
GFR, EST NON AFRICAN AMERICAN: 16 mL/min — AB (ref >90)
GLUCOSE: 114 mg/dL — AB (ref 70–99)
Glucose, Bld: 125 mg/dL — ABNORMAL HIGH (ref 70–99)
Glucose, Bld: 116 mg/dL — ABNORMAL HIGH (ref 70–99)
Potassium: 4.1 mEq/L (ref 3.7–5.3)
Potassium: 4.3 mEq/L (ref 3.7–5.3)
Potassium: 4.5 mEq/L (ref 3.7–5.3)
Sodium: 134 mEq/L — ABNORMAL LOW (ref 137–147)
Sodium: 138 mEq/L (ref 137–147)
Sodium: 138 mEq/L (ref 137–147)
TOTAL PROTEIN: 5.2 g/dL — AB (ref 6.0–8.3)
Total Bilirubin: 0.5 mg/dL (ref 0.3–1.2)
Total Protein: 5.3 g/dL — ABNORMAL LOW (ref 6.0–8.3)
Total Protein: 5 g/dL — ABNORMAL LOW (ref 6.0–8.3)

## 2013-10-27 LAB — CBC
HCT: 32.9 % — ABNORMAL LOW (ref 36.0–46.0)
HEMOGLOBIN: 11.7 g/dL — AB (ref 12.0–15.0)
MCH: 30 pg (ref 26.0–34.0)
MCHC: 35.6 g/dL (ref 30.0–36.0)
MCV: 84.4 fL (ref 78.0–100.0)
Platelets: 248 10*3/uL (ref 150–400)
RBC: 3.9 MIL/uL (ref 3.87–5.11)
RDW: 14.5 % (ref 11.5–15.5)
WBC: 46.1 10*3/uL — ABNORMAL HIGH (ref 4.0–10.5)

## 2013-10-27 LAB — BASIC METABOLIC PANEL
BUN: 64 mg/dL — ABNORMAL HIGH (ref 6–23)
CALCIUM: 7.6 mg/dL — AB (ref 8.4–10.5)
CO2: 14 mEq/L — ABNORMAL LOW (ref 19–32)
CREATININE: 2.91 mg/dL — AB (ref 0.50–1.10)
Chloride: 102 mEq/L (ref 96–112)
GFR calc Af Amer: 19 mL/min — ABNORMAL LOW (ref >90)
GFR calc non Af Amer: 16 mL/min — ABNORMAL LOW (ref >90)
Glucose, Bld: 127 mg/dL — ABNORMAL HIGH (ref 70–99)
Potassium: 4.2 mEq/L (ref 3.7–5.3)
Sodium: 137 mEq/L (ref 137–147)

## 2013-10-27 LAB — MRSA PCR SCREENING: MRSA BY PCR: NEGATIVE

## 2013-10-27 LAB — LACTIC ACID, PLASMA
LACTIC ACID, VENOUS: 2.9 mmol/L — AB (ref 0.5–2.2)
Lactic Acid, Venous: 2.1 mmol/L (ref 0.5–2.2)

## 2013-10-27 LAB — BLOOD GAS, ARTERIAL
ACID-BASE DEFICIT: 11.9 mmol/L — AB (ref 0.0–2.0)
Bicarbonate: 9.4 mEq/L — ABNORMAL LOW (ref 20.0–24.0)
DRAWN BY: 257701
O2 Content: 2 L/min
O2 SAT: 97.3 %
PATIENT TEMPERATURE: 98.6
TCO2: 8.3 mmol/L (ref 0–100)
pCO2 arterial: 12.7 mmHg — CL (ref 35.0–45.0)
pH, Arterial: 7.483 — ABNORMAL HIGH (ref 7.350–7.450)
pO2, Arterial: 101 mmHg — ABNORMAL HIGH (ref 80.0–100.0)

## 2013-10-27 LAB — APTT: APTT: 26 s (ref 24–37)

## 2013-10-27 LAB — PROTIME-INR
INR: 1.3 (ref 0.00–1.49)
Prothrombin Time: 15.9 seconds — ABNORMAL HIGH (ref 11.6–15.2)

## 2013-10-27 LAB — MAGNESIUM: MAGNESIUM: 1.8 mg/dL (ref 1.5–2.5)

## 2013-10-27 MED ORDER — SODIUM CHLORIDE 0.9 % IV BOLUS (SEPSIS)
2000.0000 mL | Freq: Once | INTRAVENOUS | Status: AC
  Administered 2013-10-27: 2000 mL via INTRAVENOUS

## 2013-10-27 MED ORDER — LORAZEPAM 2 MG/ML IJ SOLN
2.0000 mg | Freq: Once | INTRAMUSCULAR | Status: AC
  Administered 2013-10-27: 2 mg via INTRAVENOUS

## 2013-10-27 MED ORDER — MIDAZOLAM HCL 2 MG/2ML IJ SOLN
INTRAMUSCULAR | Status: AC
  Administered 2013-10-27 (×2): 1 mg
  Filled 2013-10-27: qty 2

## 2013-10-27 MED ORDER — LORAZEPAM 2 MG/ML IJ SOLN
INTRAMUSCULAR | Status: AC
  Filled 2013-10-27: qty 1

## 2013-10-27 MED ORDER — STERILE WATER FOR INJECTION IV SOLN
Freq: Once | INTRAVENOUS | Status: AC
  Administered 2013-10-27: 16:00:00 via INTRAVENOUS
  Filled 2013-10-27 (×2): qty 850

## 2013-10-27 MED ORDER — LORAZEPAM 2 MG/ML IJ SOLN
3.0000 mg | Freq: Once | INTRAMUSCULAR | Status: AC
  Administered 2013-10-27: 3 mg via INTRAVENOUS

## 2013-10-27 MED ORDER — FENTANYL CITRATE 0.05 MG/ML IJ SOLN
INTRAMUSCULAR | Status: AC
  Administered 2013-10-27: 50 ug
  Filled 2013-10-27: qty 2

## 2013-10-27 MED ORDER — VANCOMYCIN 50 MG/ML ORAL SOLUTION
500.0000 mg | Freq: Four times a day (QID) | ORAL | Status: DC
Start: 2013-10-27 — End: 2013-10-27
  Administered 2013-10-27: 500 mg via ORAL
  Filled 2013-10-27 (×4): qty 10

## 2013-10-27 MED ORDER — FENTANYL CITRATE 0.05 MG/ML IJ SOLN
50.0000 ug | Freq: Once | INTRAMUSCULAR | Status: AC
  Administered 2013-10-27: 50 ug via INTRAVENOUS

## 2013-10-27 MED ORDER — SODIUM CHLORIDE 0.9 % IR SOLN
500.0000 mg | Freq: Four times a day (QID) | Status: DC
Start: 2013-10-27 — End: 2013-11-02
  Administered 2013-10-27 – 2013-11-02 (×22): 500 mg via RECTAL
  Filled 2013-10-27 (×42): qty 500

## 2013-10-27 NOTE — Progress Notes (Signed)
Pt agitated and restless with limited verbal interaction. Does not follow commands.  BP 104 doppled in left wrist and 98/74 with automatic BP cuff.  Pt oliguric with only 30cc amber urine out for at least four hours. Foley irrigated and bladder scanned.  Hospitalist NP aware; she came to view pt at the bedside.  Orders received.

## 2013-10-27 NOTE — Care Management Note (Signed)
   CARE MANAGEMENT NOTE 10/27/2013  Patient:  [name], [name]   Account Number:  [id number]  Date Initiated:  10/27/2013  Documentation initiated by:  Demiana Crumbley  Subjective/Objective Assessment:   63 yo female admitted with abdominal pain. PCP: [name], MD     Action/Plan:   Home vs SNF   Anticipated DC Date:     Anticipated DC Plan:    In-house referral  Clinical Social Worker      DC Planning Services  CM consult      Choice offered to / List presented to:  NA   DME arranged  NA      DME agency  NA     [place] arranged  NA      [place] agency  NA   Status of service:  In process, will continue to follow Medicare Important Message given?   (If response is "NO", the following Medicare IM given date fields will be blank) Date Medicare IM given:   Date Additional Medicare IM given:    Discharge Disposition:    Per UR Regulation:  Reviewed for med. necessity/level of care/duration of stay  If discussed at [place] of Stay Meetings, dates discussed:    Comments:  10/27/13 [place] [phone] Chart reviewed for utilization of services. Pt previously discharged from a SNF. PT to eval for further dc planning.

## 2013-10-27 NOTE — Progress Notes (Addendum)
Abnormal labs paged to NP on call via amion--creat/BUN, WBC--no new orders. Patient with no changes throughout night. Remains confused, labored breathing at times.
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
## 2013-10-27 NOTE — ED Provider Notes (Signed)
Medical screening examination/treatment/procedure(s) were performed by non-physician practitioner and as supervising physician I was immediately available for consultation/collaboration.      Blanchie Dessert, MD 10/27/13 [phone]

## 2013-10-27 NOTE — Consult Note (Signed)
Reason for Consult: Confusion, C diff colitis, and Salmonella positive Referring Physician:  Dr. Dia Crawford  [name] is an 63 y.o. female.  HPI: 63 y/o female who lives alone, family brought her to the ER on 10/26/13 with confusion and dark tarry colored diarrhea.  They said she has had this for a few days at home but didn't tell anyone.  She has also reported some nausea and vomiting at home. 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 This cleared and she was discharged to SNF.  The family was concerned she was taking her pain meds incorrectly. Work up in the ER shows an elevated WBC of 21K, Na 135, BUN 52 and creatinine 2.94 she was admitted to the hospital by the Medicine service.  CT of the head shows mild chronic microvascular changes, but nothing acute.  CT of the abdomen and pelvis show thickening and inflammatory changes of the cecum, ascending and it looks like the transverse colon.  She has become progressively more confused and her mental status has declined, she is tachycardic, with renal failure, and significant leukocytosis. We are ask to see to access her for an acute abdomen.  Stool samples are positive for C. Diff colitis and salmonella.  Past Medical History  Diagnosis Date  Back pain with surgery Procedure(s): L4/5 Lumbar decompression beyond what was necessary for a Plif  Plif L4/5 with 68m x 269mcapstone peek cages x2 , morselized local autograft  POSTERIORlateral LUMBAR FUSION L4-5  LEVEL 09/18/13,  KyWinfield CunasMD    Bipolar disorder/ Anxiety/Depression/Sleep disorder   Mild renal insuffiencey  1.3-1.8 range 2014   H. pylori infection    hx 4/05  DJD (degenerative joint disease) of knee    bilateral, minimal spndyloesthesis L4-5  History of migraine    last time years ago  Joint pain   Joint swelling   Chronic back pain    stenosis/spondylosis/radiculopathy  Urinary urgency     Past Surgical  History  Procedure Laterality Date  . Carpal tunnel release  1998    bilateral  . Knee arthroscopy  06/00    bilateral  . Endometrial biopsy  09/2001    benign proliferative 09/17/2001  . Knee surgery  2011    tkr left  . Total knee arthroplasty  2011    rt  . Tubal ligation    . Arm fracture      right distal may 2012  . Colonoscopy    . Esophagogastroduodenoscopy    . Left breast needle biopsy      benign  . Back surgery      Family History  Problem Relation Age of Onset  . Lung cancer Mother   . Hypertension Mother   . Colon cancer Sister   . Heart murmur Sister   . Other Father     unknown    Social History:  reports that she has never smoked. She has never used smokeless tobacco. She reports that she does not drink alcohol or use illicit drugs.  Allergies:  Allergies  Allergen Reactions  . Aspirin     REACTION: rash    Medications:  Prior to Admission:  Prescriptions prior to admission  Medication Sig Dispense Refill  . buPROPion (WELLBUTRIN XL) 150 MG 24 hr tablet Take 150 mg by mouth daily.       . cholecalciferol (VITAMIN D) 1000 UNITS tablet Take 1,000 Units by mouth daily.      .[name]  escitalopram (LEXAPRO) 20 MG tablet Take 20 mg by mouth daily.      [name] esomeprazole (NEXIUM) 40 MG capsule Take 40 mg by mouth daily.      . ferrous sulfate 325 (65 FE) MG tablet Take 325 mg by mouth daily with breakfast.      . hydrOXYzine (ATARAX/VISTARIL) 25 MG tablet Take 25 mg by mouth 3 (three) times daily as needed for itching.      . lamoTRIgine (LAMICTAL) 200 MG tablet Take 200 mg by mouth daily.       [name] levothyroxine (SYNTHROID, LEVOTHROID) 75 MCG tablet Take 75 mcg by mouth daily.      [name] oxyCODONE (OXY IR/ROXICODONE) 5 MG immediate release tablet Take 1-2 tablets (5-10 mg total) by mouth every 6 (six) hours as needed for moderate pain or severe pain.  80 tablet  0  . potassium chloride SA (K-DUR,KLOR-CON) 20 MEQ tablet TAKE 1 TABLET (20 MEQ TOTAL) BY MOUTH DAILY.  90  tablet  0  . QUEtiapine (SEROQUEL XR) 200 MG 24 hr tablet Take 200 mg by mouth at bedtime.      [name] QUEtiapine (SEROQUEL) 300 MG tablet Take 300 mg by mouth at bedtime.      . rosuvastatin (CRESTOR) 10 MG tablet Take 10 mg by mouth at bedtime.      [name] telmisartan-hydrochlorothiazide (MICARDIS HCT) 80-25 MG per tablet Take 1 tablet by mouth daily.       Scheduled: . atorvastatin  10 mg Oral q1800  . cholecalciferol  1,000 Units Oral Daily  . ciprofloxacin  400 mg Intravenous Q24H  . ferrous sulfate  325 mg Oral QPC breakfast  . heparin  5,000 Units Subcutaneous 3 times per day  . lamoTRIgine  200 mg Oral Daily  . levothyroxine  75 mcg Oral QAC breakfast  . metronidazole  500 mg Intravenous Q8H  . pantoprazole (PROTONIX) IV  40 mg Intravenous Q12H  . potassium chloride SA  20 mEq Oral Daily  .  sodium bicarbonate 150 mEq in sterile water 1000 mL infusion   Intravenous Once  . sodium chloride  2,000 mL Intravenous Once  . vancomycin  500 mg Oral Q6H   Continuous: . sodium chloride Stopped (10/26/13 1736)  . sodium chloride 125 mL/hr at 10/27/13 0815   RKV:[id number], acetaminophen, fentaNYL, ondansetron (ZOFRAN) IV Anti-infectives   Start     Dose/Rate Route Frequency Ordered Stop   10/27/13 1400  vancomycin (VANCOCIN) 50 mg/mL oral solution 500 mg     500 mg Oral Every 6 hours 10/27/13 1321     10/26/13 1800  vancomycin (VANCOCIN) 50 mg/mL oral solution 125 mg  Status:  Discontinued     125 mg Oral 4 times daily 10/26/13 1633 10/27/13 1321   10/26/13 0130  metroNIDAZOLE (FLAGYL) IVPB 500 mg     500 mg 100 mL/hr over 60 Minutes Intravenous Every 8 hours 10/26/13 0041     10/26/13 0100  ciprofloxacin (CIPRO) IVPB 400 mg     400 mg 200 mL/hr over 60 Minutes Intravenous Every 24 hours 10/26/13 0034        Results for orders placed during the hospital encounter of 10/25/13 (from the past 48 hour(s))  CBC WITH DIFFERENTIAL     Status: Abnormal   Collection Time    10/25/13  8:39  PM      Result Value Ref Range   WBC 21.0 (*) 4.0 - 10.5 K/uL   RBC 3.92  3.87 - 5.11 MIL/uL  Hemoglobin 12.0  12.0 - 15.0 g/dL   HCT 33.5 (*) 36.0 - 46.0 %   MCV 85.5  78.0 - 100.0 fL   MCH 30.6  26.0 - 34.0 pg   MCHC 35.8  30.0 - 36.0 g/dL   RDW 14.2  11.5 - 15.5 %   Platelets 231  150 - 400 K/uL   Neutrophils Relative % 72  43 - 77 %   Lymphocytes Relative 23  12 - 46 %   Monocytes Relative 3  3 - 12 %   Eosinophils Relative 0  0 - 5 %   Basophils Relative 2 (*) 0 - 1 %   nRBC 1 (*) 0 /100 WBC   Neutro Abs 15.2 (*) 1.7 - 7.7 K/uL   Lymphs Abs 4.8 (*) 0.7 - 4.0 K/uL   Monocytes Absolute 0.6  0.1 - 1.0 K/uL   Eosinophils Absolute 0.0  0.0 - 0.7 K/uL   Basophils Absolute 0.4 (*) 0.0 - 0.1 K/uL   RBC Morphology RARE NRBCs     WBC Morphology       Value: MODERATE LEFT SHIFT (>5% METAS AND MYELOS,OCC PRO NOTED)   Comment: TOXIC GRANULATION     BURR CELLS     ELLIPTOCYTES     TARGET CELLS     DOHLE BODIES  COMPREHENSIVE METABOLIC PANEL     Status: Abnormal   Collection Time    10/25/13  8:39 PM      Result Value Ref Range   Sodium 135 (*) 137 - 147 mEq/L   Potassium 4.0  3.7 - 5.3 mEq/L   Chloride 95 (*) 96 - 112 mEq/L   CO2 19  19 - 32 mEq/L   Glucose, Bld 134 (*) 70 - 99 mg/dL   BUN 52 (*) 6 - 23 mg/dL   Creatinine, Ser 2.94 (*) 0.50 - 1.10 mg/dL   Calcium 8.8  8.4 - 10.5 mg/dL   Total Protein 6.3  6.0 - 8.3 g/dL   Albumin 2.5 (*) 3.5 - 5.2 g/dL   AST 42 (*) 0 - 37 U/L   ALT 23  0 - 35 U/L   Alkaline Phosphatase 97  39 - 117 U/L   Total Bilirubin 0.7  0.3 - 1.2 mg/dL   GFR calc non Af Amer 16 (*) >90 mL/min   GFR calc Af Amer 19 (*) >90 mL/min   Comment: (NOTE)     The eGFR has been calculated using the CKD EPI equation.     This calculation has not been validated in all clinical situations.     eGFR's persistently <90 mL/min signify possible Chronic Kidney     Disease.  OCCULT BLOOD, POC DEVICE     Status: None   Collection Time    10/25/13  8:45 PM       Result Value Ref Range   Fecal Occult Bld NEGATIVE  NEGATIVE  URINALYSIS, ROUTINE W REFLEX MICROSCOPIC     Status: Abnormal   Collection Time    10/25/13  9:41 PM      Result Value Ref Range   Color, Urine RED (*) YELLOW   Comment: BIOCHEMICALS MAY BE AFFECTED BY COLOR   APPearance CLOUDY (*) CLEAR   Specific Gravity, Urine 1.028  1.005 - 1.030   pH 5.0  5.0 - 8.0   Glucose, UA NEGATIVE  NEGATIVE mg/dL   Hgb urine dipstick NEGATIVE  NEGATIVE   Bilirubin Urine MODERATE (*) NEGATIVE   Ketones, ur NEGATIVE  NEGATIVE mg/dL  Protein, ur 100 (*) NEGATIVE mg/dL   Urobilinogen, UA 1.0  0.0 - 1.0 mg/dL   Nitrite NEGATIVE  NEGATIVE   Leukocytes, UA SMALL (*) NEGATIVE  URINE MICROSCOPIC-ADD ON     Status: Abnormal   Collection Time    10/25/13  9:41 PM      Result Value Ref Range   WBC, UA 11-20  <3 WBC/hpf   Casts HYALINE CASTS (*) NEGATIVE   Crystals CA OXALATE CRYSTALS (*) NEGATIVE   Urine-Other AMORPHOUS URATES/PHOSPHATES    CREATININE, URINE, RANDOM     Status: None   Collection Time    10/26/13  1:57 AM      Result Value Ref Range   Creatinine, Urine 445.7     Comment: Result confirmed by automatic dilution.     Performed at [facility]  SODIUM, URINE, RANDOM     Status: None   Collection Time    10/26/13  1:57 AM      Result Value Ref Range   Sodium, Ur 11     Comment: Performed at [facility] DIFFICILE BY PCR     Status: Abnormal   Collection Time    10/26/13  4:18 AM      Result Value Ref Range   C difficile by pcr POSITIVE (*) NEGATIVE   Comment: CRITICAL RESULT CALLED TO, READ BACK BY AND VERIFIED WITH:     TRACY RN 11:50 10/26/13 (wilsonm)     Performed at [facility]     Status: None   Collection Time    10/26/13  4:18 AM      Result Value Ref Range   Specimen Description STOOL     Special Requests NONE     Culture       Value: Culture reincubated for better growth     Performed at [facility]    Report Status PENDING    GI PATHOGEN PANEL BY PCR, STOOL     Status: None   Collection Time    10/26/13  4:18 AM      Result Value Ref Range   Campylobacter by PCR Negative     C difficile toxin A/B Positive     E coli 0157 by PCR Negative     E coli (ETEC) LT/ST Negative     E coli (STEC) Negative     Salmonella by PCR Positive     Shigella by PCR Negative     Norovirus G!/G2 Negative     Rotavirus A by PCR Negative     G lamblia by PCR Negative     Cryptosporidium by PCR Negative     Comment: (NOTE)      ** Normal Reference Range for each Analyte: Not Detected **           The detection and identification of specific gastrointestinal     microbial nucleic acid from individuals exhibiting signs and     symptoms of gastrointestinal infection aids in the diagnosis     of gastrointestinal infection when used in conjunction with     clinical evaluation, laboratory findings, and epidemiological     information.     ** The xTAG Gastrointestinal Pathogen Panel results are presumptive     and must be confirmed by FDA-cleared tests or other acceptable     reference methods.  The results of this test should not be used as     the sole basis for diagnosis,  treatment, or other patient management     decisions.     Performed using the Lum[employer] xTAG Gastrointestinal Pathogen Panel test     kit.     Performed at [facility] ACID, PLASMA     Status: Abnormal   Collection Time    10/26/13  5:53 AM      Result Value Ref Range   Lactic Acid, Venous 2.5 (*) 0.5 - 2.2 mmol/L  BASIC METABOLIC PANEL     Status: Abnormal   Collection Time    10/26/13  5:53 AM      Result Value Ref Range   Sodium 135 (*) 137 - 147 mEq/L   Potassium 4.0  3.7 - 5.3 mEq/L   Chloride 97  96 - 112 mEq/L   CO2 18 (*) 19 - 32 mEq/L   Glucose, Bld 138 (*) 70 - 99 mg/dL   BUN 52 (*) 6 - 23 mg/dL   Creatinine, Ser 2.80 (*) 0.50 - 1.10 mg/dL   Calcium 8.3 (*) 8.4 - 10.5 mg/dL   GFR calc non Af Amer 17  (*) >90 mL/min   GFR calc Af Amer 20 (*) >90 mL/min   Comment: (NOTE)     The eGFR has been calculated using the CKD EPI equation.     This calculation has not been validated in all clinical situations.     eGFR's persistently <90 mL/min signify possible Chronic Kidney     Disease.  CBC     Status: Abnormal   Collection Time    10/26/13  5:53 AM      Result Value Ref Range   WBC 23.3 (*) 4.0 - 10.5 K/uL   RBC 3.85 (*) 3.87 - 5.11 MIL/uL   Hemoglobin 11.4 (*) 12.0 - 15.0 g/dL   HCT 33.1 (*) 36.0 - 46.0 %   MCV 86.0  78.0 - 100.0 fL   MCH 29.6  26.0 - 34.0 pg   MCHC 34.4  30.0 - 36.0 g/dL   RDW 14.5  11.5 - 15.5 %   Platelets 272  150 - 400 K/uL  COMPREHENSIVE METABOLIC PANEL     Status: Abnormal   Collection Time    10/27/13  3:40 AM      Result Value Ref Range   Sodium 134 (*) 137 - 147 mEq/L   Potassium 4.1  3.7 - 5.3 mEq/L   Chloride 100  96 - 112 mEq/L   CO2 16 (*) 19 - 32 mEq/L   Glucose, Bld 125 (*) 70 - 99 mg/dL   BUN 60 (*) 6 - 23 mg/dL   Creatinine, Ser 2.91 (*) 0.50 - 1.10 mg/dL   Calcium 7.6 (*) 8.4 - 10.5 mg/dL   Total Protein 5.3 (*) 6.0 - 8.3 g/dL   Albumin 2.0 (*) 3.5 - 5.2 g/dL   AST 33  0 - 37 U/L   ALT 21  0 - 35 U/L   Alkaline Phosphatase 91  39 - 117 U/L   Total Bilirubin 0.6  0.3 - 1.2 mg/dL   GFR calc non Af Amer 16 (*) >90 mL/min   GFR calc Af Amer 19 (*) >90 mL/min   Comment: (NOTE)     The eGFR has been calculated using the CKD EPI equation.     This calculation has not been validated in all clinical situations.     eGFR's persistently <90 mL/min signify possible Chronic Kidney     Disease.  CBC WITH DIFFERENTIAL  Status: Abnormal   Collection Time    10/27/13  3:40 AM      Result Value Ref Range   WBC 39.6 (*) 4.0 - 10.5 K/uL   Comment: WHITE COUNT CONFIRMED ON SMEAR   RBC 4.12  3.87 - 5.11 MIL/uL   Hemoglobin 12.3  12.0 - 15.0 g/dL   HCT 35.2 (*) 36.0 - 46.0 %   MCV 85.4  78.0 - 100.0 fL   MCH 29.9  26.0 - 34.0 pg   MCHC 34.9  30.0  - 36.0 g/dL   RDW 14.7  11.5 - 15.5 %   Platelets 251  150 - 400 K/uL   Comment: PLATELET COUNT CONFIRMED BY SMEAR   Neutrophils Relative % 80 (*) 43 - 77 %   Lymphocytes Relative 12  12 - 46 %   Monocytes Relative 8  3 - 12 %   Eosinophils Relative 0  0 - 5 %   Basophils Relative 0  0 - 1 %   Neutro Abs 31.6 (*) 1.7 - 7.7 K/uL   Lymphs Abs 4.8 (*) 0.7 - 4.0 K/uL   Monocytes Absolute 3.2 (*) 0.1 - 1.0 K/uL   Eosinophils Absolute 0.0  0.0 - 0.7 K/uL   Basophils Absolute 0.0  0.0 - 0.1 K/uL   WBC Morphology       Value: MODERATE LEFT SHIFT (>5% METAS AND MYELOS,OCC PRO NOTED)   Comment: DOHLE BODIES   Smear Review LARGE PLATELETS PRESENT    MAGNESIUM     Status: None   Collection Time    10/27/13  3:40 AM      Result Value Ref Range   Magnesium 1.8  1.5 - 2.5 mg/dL  LACTIC ACID, PLASMA     Status: None   Collection Time    10/27/13  3:40 AM      Result Value Ref Range   Lactic Acid, Venous 2.1  0.5 - 2.2 mmol/L  BLOOD GAS, ARTERIAL     Status: Abnormal   Collection Time    10/27/13 12:36 PM      Result Value Ref Range   O2 Content 2.0     Delivery systems NASAL CANNULA     pH, Arterial 7.483 (*) 7.350 - 7.450   pCO2 arterial 12.7 (*) 35.0 - 45.0 mmHg   Comment: CRITICAL RESULT CALLED TO, READ BACK BY AND VERIFIED WITH:     DR.WOODS, MD AT 1246 BY DEDREA WALTERS RRT, RCP ON 10/27/13.   pO2, Arterial 101.0 (*) 80.0 - 100.0 mmHg   Bicarbonate 9.4 (*) 20.0 - 24.0 mEq/L   TCO2 8.3  0 - 100 mmol/L   Acid-base deficit 11.9 (*) 0.0 - 2.0 mmol/L   O2 Saturation 97.3     Patient temperature 98.6     Collection site LEFT RADIAL     Drawn by [id number]     Sample type ARTERIAL DRAW      Ct Abdomen Pelvis Wo Contrast  10/26/2013   CLINICAL DATA:  Dark stool  EXAM: CT ABDOMEN AND PELVIS WITHOUT CONTRAST  TECHNIQUE: Multidetector CT imaging of the abdomen and pelvis was performed following the standard protocol without IV contrast.  COMPARISON:  None.  FINDINGS: Motion artifact, lack of  intravenous contrast and body habitus markedly limits the examination.  Tiny pericardial effusion lateral to the left ventricle is noted.  There is wall thickening and inflammatory change involving the see come and ascending colon. The hepatic flexure, transverse colon, and distal colon are unremarkable allowing  for technical quality of the scan.  There is no pneumatosis. No extraluminal bowel gas. There is no portal venous gas. There is fluid and inflammatory stranding lateral to the cecum and ascending colon, in the region of the right pericolic gutter. Inflammatory cecal nodes.  The appendix is not clearly identified.  Small amount of free fluid layers in the anterior pelvis.  Uterus and adnexa are unremarkable.  Bladder is decompressed.  Liver, gallbladder, spleen, pancreas, adrenal glands are within normal limits.  Postoperative changes from lumbar fusion are noted.  IMPRESSION: Inflammatory changes involving the cecum and ascending colon are noted. Differential diagnosis includes infectious colitis, ischemia, acute appendicitis, and typhlitis. There is no pneumatosis. No abscess. No extraluminal bowel gas.   Electronically Signed   By: Maryclare Bean M.D.   On: 10/26/2013 12:18   Ct Head Wo Contrast  10/26/2013   CLINICAL DATA:  Altered mental status  EXAM: CT HEAD WITHOUT CONTRAST  TECHNIQUE: Contiguous axial images were obtained from the base of the skull through the vertex without intravenous contrast.  COMPARISON:  Prior CT from 09/21/2013  FINDINGS: Mild chronic microvascular ischemic disease is unchanged. There is no acute intracranial hemorrhage or infarct. No mass lesion or midline shift. Gray-white matter differentiation is well maintained. Ventricles are normal in size without evidence of hydrocephalus. CSF containing spaces are within normal limits. No extra-axial fluid collection.  The calvarium is intact.  Orbital soft tissues are within normal limits.  The paranasal sinuses and mastoid air cells  are well pneumatized and free of fluid.  Scalp soft tissues are unremarkable.  IMPRESSION: 1. No acute intracranial abnormality. 2. Mild chronic microvascular ischemic changes, stable.   Electronically Signed   By: Jeannine Boga M.D.   On: 10/26/2013 02:23    Review of Systems  Unable to perform ROS: mental acuity   Blood pressure 107/59, pulse 104, temperature 97.6 F (36.4 C), temperature source Oral, resp. rate 26, height '5\' 6"'  (1.676 m), weight 107 kg (235 lb 14.3 oz), SpO2 100.00%. Physical Exam  Constitutional:  Overweight female confused tachycardic, rapid respiratory rate, cannot really answer questions, but she can follow some commands. T 97 axillary HR 109-120's (EKG shows ST) RR 26 BP 124/77  Receiving fluid bolus now   HENT:  Head: Normocephalic and atraumatic.  Nose: Nose normal.  Eyes: Conjunctivae are normal. Right eye exhibits no discharge. Left eye exhibits no discharge. No scleral icterus.  Neck: Normal range of motion. Neck supple. No JVD present.  Cardiovascular: Normal rate, regular rhythm, normal heart sounds and intact distal pulses.  Exam reveals no gallop.   No murmur heard. Respiratory: No stridor. No respiratory distress. She has no wheezes. She has no rales. She exhibits no tenderness.  GI: Soft. She exhibits distension (May be some, large abdomen and hard to tell). She exhibits no mass. There is tenderness (some also hard to judge with her mental status). There is no rebound and no guarding.  No bowel sounds  Musculoskeletal: She exhibits edema (trace).  Lymphadenopathy:    She has no cervical adenopathy.  Neurological:  Pt is confused.  She will not answer questions, she will not open her eyes.  She follows commands for all 4 extremities, strength is poor but seems equal.  Skin: Skin is warm and dry. No rash noted. No erythema. No pallor.  Psychiatric:  Confuse and will not answer questions.   Assessment/Plan: 1.  C diff and salmonella  colitis 2.  Acute on chronic renal insuffiencey 3.  Leukocytosis 4.  Increasing Mental confusion 5.  Chronic back pain with hospitalization 1/9-1/16/15 for L4/5 decompression. 6.  Post op confusion and hallucinations with transfer to SNF after the hospitalization. 7.  Bipolar/anxiety/depression/sleep disorder with multiple medications 8.  Hypertension 9.  Hx of GERD 10.  Hypothyroid 11.  On chronic disability  Plan:  Agree with Dr. Sherral Hammers treatment.  She is going to the ICU.  2 view abdomen does not show free air.  Repeat labs are pending.  She needs fluid resuscitation, and closer monitoring. EKG shows Sinus Tachycardia.  I do not think she needs surgery at this point but we will follow with you.  JENNINGS,WILLARD 10/27/2013, 1:47 PM   Agree with above. For now needs medical treatment of infectious colitis.  She needs aggressive fluid resuscitation and monitoring. Will follow. Youngest son, [name], at bedside.  His sister, [name], has POA.  There is one other son, [name].  Alphonsa Overall, MD, [facility] Pager: [phone] Office phone:  [phone]

## 2013-10-27 NOTE — Progress Notes (Addendum)
[facility] PROGRESS NOTE  [name] [id number] DOB: 09/14/1950 DOA: 10/25/2013 PCP: [name], MD  Assessment/Plan:  Right lower quadrant pain  Patient reports 2 days of right lower quadrant pain radiating to the flank. She has tenderness over right lower quadrant and right CVA tenderness. Will check CT scan of the abdomen and pelvis without contrast.  -Patient also reports diarrhea for past 3-4 days. As per son patient was given a course of antibiotic for UTI in the skilled nursing facility 2 weeks back. Check stool for c diff.  -Lactic acid level has decreased. abdominal exam has deteriorated. Patient t having severe RLQ/LLQ abdominal pain while on pain medication.  - Continue Cipro + Flagyl. + Vancomycin -2/17 Overnight patient's leukocytosis is increased while on maximum antibiotic; pain has increased while on increased pain medication; cognition has declined, Have contacted [facility] [place] [facility] ([facility]) [phone] requested and ASAP evaluation for acute abdomen.  Altered mental status  Patient reportedly more confused for past few days and sleepy. Son feels she was taking more oxycodone than usual. Will hold oxycodone. (She has oxycodone listed as allergy in the form of confusion)  -check head CT; WNL see results below.  -Continue fentanyl for her pain symptoms.  -As she is on Wellbutrin, Lexapro for depression. She also takes an extremely high dose of Seroquel at bedtime for? Sleep disorder which I will hold for now until her mental status is improved.  -2/17 Patient's cognition has declined today. Difficult to arouse.  Hypertension  -Blood pressure stable. Cord ARB and HCTZ given acute kidney injury. - 2/17 Patient actually soft BP today bolus 2L normal saline then restart normal saline at 177ml/hr  Acute renal failure -Patient has had aggressive hydration and her kidney function has continued to worsen since admission. -Most likely secondary to increasing  infection/infarction in abdomen. Waiting for CCS recommendations -continue our aggressive hydration, if no improvement consult nephrology in the a.m.  GERD  Continue PPI  Anemia  Patient reports passing dark stools for the last few days. Stool for occult blood was negative. Hemoglobin stable   Unspecified hypothyroidism  Continue Synthroid. Check TSH   Spondylolisthesis of lumbar region  Status post lumbar decompression surgery for L4/L5 lumbar spondylosis and L4/L5 lateral recess stenosis and January 2015 by Dr. Christella Noa.   Colitis - Continue ciprofloxacin+ metronidazole. - If no improvement in the a.m. clinically, spiking fever or increasing leukocytosis consult surgery   Clostridium difficile -10/26/2013 C. difficile positive by PCR -Start antibiotic protocol (patient's WBC> 15 which places him severe category) -Stool culture pending   Acute renal failure -Bolus 1 L of saline, then normal saline at 176ml/hr     Code Status: Full Family Communication:  Disposition Plan: Resolution colitis/C. difficile/Salmonella   Consultants:     Procedures: GI pathogen panel 10/26/2013 Campylobacter by PCR Negative  C difficile toxin A/B Positive  E coli 0157 by PCR Negative  E coli (ETEC) LT/ST Negative  E coli (STEC) Negative  Salmonella by PCR Positive  Shigella by PCR Negative  Norovirus G!/G2 Negative  Rotavirus A by PCR Negative  G lamblia by PCR Negative  Cryptosporidium by PCR Negative   CT abdomen pelvis without contrast 10/26/2013 Inflammatory changes involving the cecum and ascending colon are  noted. Differential diagnosis includes infectious colitis, ischemia,  acute appendicitis, and typhlitis. There is no pneumatosis. No  abscess. No extraluminal bowel gas.     Antibiotics: Ciprofloxacin 2/16>> Metronidazole 2/16>> Vancomycin 2/16>>    HPI/Subjective: 62-yo BF PMHx depression,  memory loss, frequent falls, hypertension, hyperlipidemia,  depression, GERD, hypothyroidism,with history of lumbar spondylosis status post lumbar decompression surgery in January 2015, recently treated with antibiotic for UTI was brought in by family as patient complained of having loose / watery diarrhea of 3-4 episodes for past few days. Patient is confused during conversation she was primarily provided by her son at bedside. Patient was discharged to skilled nursing facility about 10 days back. For past 2 days she has been complaining of stools and right lower quadrant pain radiating to the back. Patient also has been noticed to be more confused and sleepy for past 2 days and her son feels that she may be taking some extra dose of oxycodone. Patient also informs some nausea and vomiting but is unable to explain further. She did not have headache, dizziness, dysuria, weakness, syncope, muscle aches or joint pains. 2/16 patient in moderate amount of pain, positive N/V. 2/17 patient moaning, very lethargic difficult to arouse unable to answer questions appropriately. Significant change from 2/16   Objective: Filed Vitals:   10/27/13 0014 10/27/13 0228 10/27/13 0408 10/27/13 0510  BP:   107/59   Pulse: 113 106 114 104  Temp:   97.6 F (36.4 C)   TempSrc:   Oral   Resp: 28 22 22 26   Height:      Weight:      SpO2: 100% 100% 100% 100%    Intake/Output Summary (Last 24 hours) at 10/27/13 1214 Last data filed at 10/27/13 0900  Gross per 24 hour  Intake 3907.33 ml  Output    425 ml  Net 3482.33 ml   Filed Weights   10/26/13 0021 10/26/13 0040  Weight: 117.1 kg (258 lb 2.5 oz) 107 kg (235 lb 14.3 oz)    Exam:   General:  A./O. x1, very lethargic, difficult to arouse, moderate-severe abdominal pain with pain medication on board  Cardiovascular: Tachycardic, regular rhythm, negative murmurs rubs or gallops  Respiratory: Clear to auscultation bilateral  Abdomen: Epigastric/left lower quadrant/RLQ severe pain to palpation  Musculoskeletal:  Negative pedal edema   Data Reviewed: Basic Metabolic Panel:  Recent Labs Lab 10/25/13 2039 10/26/13 0553 10/27/13 0340  NA 135* 135* 134*  K 4.0 4.0 4.1  CL 95* 97 100  CO2 19 18* 16*  GLUCOSE 134* 138* 125*  BUN 52* 52* 60*  CREATININE 2.94* 2.80* 2.91*  CALCIUM 8.8 8.3* 7.6*  MG  --   --  1.8   Liver Function Tests:  Recent Labs Lab 10/25/13 2039 10/27/13 0340  AST 42* 33  ALT 23 21  ALKPHOS 97 91  BILITOT 0.7 0.6  PROT 6.3 5.3*  ALBUMIN 2.5* 2.0*   No results found for this basename: LIPASE, AMYLASE,  in the last 168 hours No results found for this basename: AMMONIA,  in the last 168 hours CBC:  Recent Labs Lab 10/25/13 2039 10/26/13 0553 10/27/13 0340  WBC 21.0* 23.3* 39.6*  NEUTROABS 15.2*  --  31.6*  HGB 12.0 11.4* 12.3  HCT 33.5* 33.1* 35.2*  MCV 85.5 86.0 85.4  PLT 231 272 251   Cardiac Enzymes: No results found for this basename: CKTOTAL, CKMB, CKMBINDEX, TROPONINI,  in the last 168 hours BNP (last 3 results) No results found for this basename: PROBNP,  in the last 8760 hours CBG: No results found for this basename: GLUCAP,  in the last 168 hours  Recent Results (from the past 240 hour(s))  CLOSTRIDIUM DIFFICILE BY PCR     Status:  Abnormal   Collection Time    10/26/13  4:18 AM      Result Value Ref Range Status   C difficile by pcr POSITIVE (*) NEGATIVE Final   Comment: CRITICAL RESULT CALLED TO, READ BACK BY AND VERIFIED WITH:     TRACY RN 11:50 10/26/13 ([email or web address])     Performed at [facility]     Status: None   Collection Time    10/26/13  4:18 AM      Result Value Ref Range Status   Specimen Description STOOL   Final   Special Requests NONE   Final   Culture     Final   Value: Culture reincubated for better growth     Performed at [facility]   Report Status PENDING   Incomplete     Studies: Ct Abdomen Pelvis Wo Contrast  10/26/2013   CLINICAL DATA:  Dark stool  EXAM: CT ABDOMEN AND PELVIS  WITHOUT CONTRAST  TECHNIQUE: Multidetector CT imaging of the abdomen and pelvis was performed following the standard protocol without IV contrast.  COMPARISON:  None.  FINDINGS: Motion artifact, lack of intravenous contrast and body habitus markedly limits the examination.  Tiny pericardial effusion lateral to the left ventricle is noted.  There is wall thickening and inflammatory change involving the see come and ascending colon. The hepatic flexure, transverse colon, and distal colon are unremarkable allowing for technical quality of the scan.  There is no pneumatosis. No extraluminal bowel gas. There is no portal venous gas. There is fluid and inflammatory stranding lateral to the cecum and ascending colon, in the region of the right pericolic gutter. Inflammatory cecal nodes.  The appendix is not clearly identified.  Small amount of free fluid layers in the anterior pelvis.  Uterus and adnexa are unremarkable.  Bladder is decompressed.  Liver, gallbladder, spleen, pancreas, adrenal glands are within normal limits.  Postoperative changes from lumbar fusion are noted.  IMPRESSION: Inflammatory changes involving the cecum and ascending colon are noted. Differential diagnosis includes infectious colitis, ischemia, acute appendicitis, and typhlitis. There is no pneumatosis. No abscess. No extraluminal bowel gas.   Electronically Signed   By: Maryclare Bean M.D.   On: 10/26/2013 12:18   Ct Head Wo Contrast  10/26/2013   CLINICAL DATA:  Altered mental status  EXAM: CT HEAD WITHOUT CONTRAST  TECHNIQUE: Contiguous axial images were obtained from the base of the skull through the vertex without intravenous contrast.  COMPARISON:  Prior CT from 09/21/2013  FINDINGS: Mild chronic microvascular ischemic disease is unchanged. There is no acute intracranial hemorrhage or infarct. No mass lesion or midline shift. Gray-white matter differentiation is well maintained. Ventricles are normal in size without evidence of hydrocephalus.  CSF containing spaces are within normal limits. No extra-axial fluid collection.  The calvarium is intact.  Orbital soft tissues are within normal limits.  The paranasal sinuses and mastoid air cells are well pneumatized and free of fluid.  Scalp soft tissues are unremarkable.  IMPRESSION: 1. No acute intracranial abnormality. 2. Mild chronic microvascular ischemic changes, stable.   Electronically Signed   By: Jeannine Boga M.D.   On: 10/26/2013 02:23    Scheduled Meds: . atorvastatin  10 mg Oral q1800  . cholecalciferol  1,000 Units Oral Daily  . ciprofloxacin  400 mg Intravenous Q24H  . ferrous sulfate  325 mg Oral QPC breakfast  . heparin  5,000 Units Subcutaneous 3 times per day  . lamoTRIgine  200 mg Oral Daily  . levothyroxine  75 mcg Oral QAC breakfast  . metronidazole  500 mg Intravenous Q8H  . pantoprazole (PROTONIX) IV  40 mg Intravenous Q12H  . potassium chloride SA  20 mEq Oral Daily  . vancomycin  125 mg Oral QID   Continuous Infusions: . sodium chloride Stopped (10/26/13 1736)  . sodium chloride 125 mL/hr at 10/27/13 3716    Principal Problem:   Acute renal failure Active Problems:   DEPRESSION   HYPERTENSION   Memory loss   ANEMIA   Unspecified hypothyroidism   Spondylolisthesis of lumbar region   Obesity   Right lower quadrant abdominal pain   Altered mental status   Acute kidney injury    Time spent: 40 minutes   WOODS, CURTIS, J  [facility] Pager [phone]. If 7PM-7AM, please contact night-coverage at [email or web address], password [facility] 10/27/2013, 12:14 PM  LOS: 2 days

## 2013-10-27 NOTE — Progress Notes (Signed)
Report given to ICU nurse Janett Billow RN-  Sandie Ano RN

## 2013-10-27 NOTE — Procedures (Signed)
After explaining the risks and benefits is to patient's son and sister they agreed to procedure. Patient was sedated with 5 mg Ativan + 2 mg Versed+ 100 mg fentanyl. Left upper chest area was prepared and sterile fashion, 1% lidocaine was used as local anesthetic and a left subclavian central venous line was placed. Patient tolerated procedure well, minimal blood loss. A PCXR was ordered stat to ensure proper placement of line.    90 minutes was spent on counseling and procedure

## 2013-10-27 NOTE — Progress Notes (Signed)
Patient assisted to [facility] and back to bed. Patient with labored breathing, sats 97-100 % on room air but respirations between 30-36/minute. Patient breathing heavy and anxious. RT placed on oxygen at 2l [place] for "comfort". Tachycardic in 110's. Paged NP on call Archivist). No new orders but just to monitor sats and any changes.

## 2013-10-28 ENCOUNTER — Inpatient Hospital Stay (HOSPITAL_COMMUNITY): Payer: Medicare Other

## 2013-10-28 LAB — COMPREHENSIVE METABOLIC PANEL WITH GFR
ALT: 21 U/L (ref 0–35)
AST: 42 U/L — ABNORMAL HIGH (ref 0–37)
Albumin: 1.9 g/dL — ABNORMAL LOW (ref 3.5–5.2)
Alkaline Phosphatase: 77 U/L (ref 39–117)
BUN: 64 mg/dL — ABNORMAL HIGH (ref 6–23)
CO2: 16 meq/L — ABNORMAL LOW (ref 19–32)
Calcium: 7.2 mg/dL — ABNORMAL LOW (ref 8.4–10.5)
Chloride: 101 meq/L (ref 96–112)
Creatinine, Ser: 3.03 mg/dL — ABNORMAL HIGH (ref 0.50–1.10)
GFR calc Af Amer: 18 mL/min — ABNORMAL LOW
GFR calc non Af Amer: 15 mL/min — ABNORMAL LOW
Glucose, Bld: 107 mg/dL — ABNORMAL HIGH (ref 70–99)
Potassium: 3.9 meq/L (ref 3.7–5.3)
Sodium: 136 meq/L — ABNORMAL LOW (ref 137–147)
Total Bilirubin: 0.5 mg/dL (ref 0.3–1.2)
Total Protein: 4.9 g/dL — ABNORMAL LOW (ref 6.0–8.3)

## 2013-10-28 LAB — URINALYSIS, ROUTINE W REFLEX MICROSCOPIC
Glucose, UA: NEGATIVE mg/dL
Ketones, ur: NEGATIVE mg/dL
NITRITE: NEGATIVE
PH: 5.5 (ref 5.0–8.0)
Protein, ur: 100 mg/dL — AB
SPECIFIC GRAVITY, URINE: 1.023 (ref 1.005–1.030)
Urobilinogen, UA: 0.2 mg/dL (ref 0.0–1.0)

## 2013-10-28 LAB — VANCOMYCIN, RANDOM

## 2013-10-28 LAB — DRUGS OF ABUSE SCREEN W/O ALC, ROUTINE URINE
Amphetamine Screen, Ur: NEGATIVE
BARBITURATE QUANT UR: NEGATIVE
Benzodiazepines.: NEGATIVE
Cocaine Metabolites: NEGATIVE
Creatinine,U: 511 mg/dL
METHADONE: NEGATIVE
Marijuana Metabolite: NEGATIVE
OPIATE SCREEN, URINE: NEGATIVE
PHENCYCLIDINE (PCP): POSITIVE — AB
Propoxyphene: NEGATIVE

## 2013-10-28 LAB — CBC WITH DIFFERENTIAL/PLATELET
Basophils Absolute: 0 10*3/uL (ref 0.0–0.1)
Basophils Relative: 0 % (ref 0–1)
EOS PCT: 0 % (ref 0–5)
Eosinophils Absolute: 0 10*3/uL (ref 0.0–0.7)
HCT: 29.8 % — ABNORMAL LOW (ref 36.0–46.0)
HEMOGLOBIN: 10.3 g/dL — AB (ref 12.0–15.0)
LYMPHS ABS: 7.3 10*3/uL — AB (ref 0.7–4.0)
Lymphocytes Relative: 14 % (ref 12–46)
MCH: 29.7 pg (ref 26.0–34.0)
MCHC: 34.6 g/dL (ref 30.0–36.0)
MCV: 85.9 fL (ref 78.0–100.0)
MONO ABS: 1.6 10*3/uL — AB (ref 0.1–1.0)
Monocytes Relative: 3 % (ref 3–12)
Neutro Abs: 43 10*3/uL — ABNORMAL HIGH (ref 1.7–7.7)
Neutrophils Relative %: 83 % — ABNORMAL HIGH (ref 43–77)
Platelets: ADEQUATE 10*3/uL (ref 150–400)
RBC: 3.47 MIL/uL — AB (ref 3.87–5.11)
RDW: 15 % (ref 11.5–15.5)
WBC: 51.9 10*3/uL (ref 4.0–10.5)

## 2013-10-28 LAB — URINE MICROSCOPIC-ADD ON

## 2013-10-28 LAB — LACTIC ACID, PLASMA: Lactic Acid, Venous: 1.5 mmol/L (ref 0.5–2.2)

## 2013-10-28 LAB — MAGNESIUM: Magnesium: 1.8 mg/dL (ref 1.5–2.5)

## 2013-10-28 LAB — SAVE SMEAR

## 2013-10-28 MED ORDER — ALBUTEROL SULFATE (2.5 MG/3ML) 0.083% IN NEBU
2.5000 mg | INHALATION_SOLUTION | Freq: Three times a day (TID) | RESPIRATORY_TRACT | Status: DC
  Administered 2013-10-28 – 2013-10-30 (×9): 2.5 mg via RESPIRATORY_TRACT
  Filled 2013-10-28 (×9): qty 3

## 2013-10-28 MED ORDER — LEVALBUTEROL HCL 1.25 MG/0.5ML IN NEBU
1.2500 mg | INHALATION_SOLUTION | Freq: Once | RESPIRATORY_TRACT | Status: AC
  Administered 2013-10-28: 1.25 mg via RESPIRATORY_TRACT
  Filled 2013-10-28: qty 0.5

## 2013-10-28 MED ORDER — LEVOTHYROXINE SODIUM 100 MCG IV SOLR
38.0000 ug | Freq: Every day | INTRAVENOUS | Status: DC
Start: 2013-10-29 — End: 2013-11-03
  Administered 2013-10-29 – 2013-11-03 (×6): 38 ug via INTRAVENOUS
  Filled 2013-10-28 (×9): qty 5

## 2013-10-28 MED ORDER — ALBUTEROL SULFATE (2.5 MG/3ML) 0.083% IN NEBU: 2.5000 mg | INHALATION_SOLUTION | Freq: Four times a day (QID) | RESPIRATORY_TRACT | Status: DC | PRN

## 2013-10-28 MED ORDER — LORAZEPAM 2 MG/ML IJ SOLN
INTRAMUSCULAR | Status: AC
  Filled 2013-10-28: qty 1

## 2013-10-28 MED ORDER — FAMOTIDINE IN NACL 20-0.9 MG/50ML-% IV SOLN
20.0000 mg | Freq: Two times a day (BID) | INTRAVENOUS | Status: DC
  Administered 2013-10-28 – 2013-10-30 (×5): 20 mg via INTRAVENOUS
  Filled 2013-10-28 (×7): qty 50

## 2013-10-28 MED ORDER — DEXTROSE 5 % IV SOLN
1.0000 g | INTRAVENOUS | Status: DC
  Administered 2013-10-28 – 2013-11-04 (×8): 1 g via INTRAVENOUS
  Filled 2013-10-28 (×8): qty 10

## 2013-10-28 MED ORDER — LORAZEPAM 2 MG/ML IJ SOLN
0.5000 mg | Freq: Once | INTRAMUSCULAR | Status: AC
  Administered 2013-10-28: 0.5 mg via INTRAVENOUS

## 2013-10-28 MED ORDER — ALBUMIN HUMAN 25 % IV SOLN
25.0000 g | Freq: Four times a day (QID) | INTRAVENOUS | Status: AC
  Administered 2013-10-28 – 2013-10-29 (×4): 25 g via INTRAVENOUS
  Filled 2013-10-28 (×5): qty 100

## 2013-10-28 MED ORDER — IPRATROPIUM BROMIDE 0.02 % IN SOLN: 0.5000 mg | Freq: Four times a day (QID) | RESPIRATORY_TRACT | Status: DC | PRN
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
## 2013-10-28 NOTE — Progress Notes (Signed)
PT Cancellation Note  Patient Details Name: [name] MRN: [id number] DOB: Oct 22, 1950   Cancelled Treatment:    Reason Eval/Treat Not Completed: Medical issues which prohibited therapy (pt not awake/alert enough for therapy at this time per RN)   York Ram E 10/28/2013, 1:31 PM Carmelia Bake, PT, DPT 10/28/2013 Pager: [phone]

## 2013-10-28 NOTE — Progress Notes (Signed)
General Surgery Note  LOS: 3 days  POD -    In ICU  Assessment/Plan: 1.  C Diff/Salmonella colitis  Flagyl/Cipro - 2/16 >>>  Vanc enemas - 2/17 >>>  Very confused, restrained, but no real abdominal tenderness.   2.  Renal failure   Creatinine - 3.03 - 10/28/2013 3.  Recent back surgery - L4/5 decompression - K. Cabbell  Hospitalized 1/9/- 09/25/2013 4. Bipolar/anxiety/depression/sleep disorder with multiple medications  5. Hypertension  6. Hx of GERD  7. Hypothyroid  8. On chronic disability  9.  DVT prophylaxis - SQ Heparin   Principal Problem:   Acute renal failure Active Problems:   DEPRESSION   HYPERTENSION   Memory loss   ANEMIA   Unspecified hypothyroidism   Spondylolisthesis of lumbar region   Obesity   Right lower quadrant abdominal pain   Altered mental status   Acute kidney injury  Subjective:  Very confused, restrained.  Cannot carry on discussion. Objective:   Filed Vitals:   10/28/13 0700  BP: 98/78  Pulse: 123  Temp:   Resp: 31     Intake/Output from previous day:  02/17 0701 - 02/18 0700 In: 6317.5 [I.V.:2647.5; IV Piggyback:2500] Out: 225 [Urine:225]  Intake/Output this shift:      Physical Exam:   General: Older AA F who responds, but is disoriented.   HEENT: Normal. Pupils equal. .   Lungs: Clear.   Abdomen: Soft.  No localized tenderness.  BS present.     Lab Results:    Recent Labs  10/27/13 1410 10/28/13 0241  WBC 46.1* 51.9*  HGB 11.7* 10.3*  HCT 32.9* 29.8*  PLT 248 PLATELET CLUMPS NOTED ON SMEAR, COUNT APPEARS ADEQUATE    BMET   Recent Labs  10/27/13 1912 10/28/13 0241  NA 138 136*  K 4.5 3.9  CL 104 101  CO2 15* 16*  GLUCOSE 116* 107*  BUN 63* 64*  CREATININE 3.02* 3.03*  CALCIUM 7.4* 7.2*    PT/INR   Recent Labs  10/27/13 1410  LABPROT 15.9*  INR 1.30    ABG   Recent Labs  10/27/13 1236  PHART 7.483*  HCO3 9.4*     Studies/Results:  Ct Abdomen Pelvis Wo Contrast  10/26/2013   CLINICAL  DATA:  Dark stool  EXAM: CT ABDOMEN AND PELVIS WITHOUT CONTRAST  TECHNIQUE: Multidetector CT imaging of the abdomen and pelvis was performed following the standard protocol without IV contrast.  COMPARISON:  None.  FINDINGS: Motion artifact, lack of intravenous contrast and body habitus markedly limits the examination.  Tiny pericardial effusion lateral to the left ventricle is noted.  There is wall thickening and inflammatory change involving the see come and ascending colon. The hepatic flexure, transverse colon, and distal colon are unremarkable allowing for technical quality of the scan.  There is no pneumatosis. No extraluminal bowel gas. There is no portal venous gas. There is fluid and inflammatory stranding lateral to the cecum and ascending colon, in the region of the right pericolic gutter. Inflammatory cecal nodes.  The appendix is not clearly identified.  Small amount of free fluid layers in the anterior pelvis.  Uterus and adnexa are unremarkable.  Bladder is decompressed.  Liver, gallbladder, spleen, pancreas, adrenal glands are within normal limits.  Postoperative changes from lumbar fusion are noted.  IMPRESSION: Inflammatory changes involving the cecum and ascending colon are noted. Differential diagnosis includes infectious colitis, ischemia, acute appendicitis, and typhlitis. There is no pneumatosis. No abscess. No extraluminal bowel gas.  Electronically Signed   By: Maryclare Bean M.D.   On: 10/26/2013 12:18   Dg Chest Port 1 View  10/28/2013   CLINICAL DATA:  Increasing cough, leukocytosis, sepsis.  EXAM: PORTABLE CHEST - 1 VIEW  COMPARISON:  DG CHEST 1V PORT dated 10/27/2013  FINDINGS: Cardiomediastinal silhouette is unremarkable for this low inspiratory portable examination with crowded vasculature markings. The lungs are clear without pleural effusions or focal consolidations. Trachea projects midline and there is no pneumothorax. Included soft tissue planes and osseous structures are  non-suspicious. Multiple EKG lines overlie the patient and may obscure subtle underlying pathology. Left subclavian central venous catheter with distal tip projecting at cavoatrial junction.  IMPRESSION: No acute cardiopulmonary process for this low inspiratory portable examination.  No apparent change in position of life support line.   Electronically Signed   By: Elon Alas   On: 10/28/2013 04:36   Dg Chest Port 1 View  10/27/2013   CLINICAL DATA:  Central line placement.  EXAM: PORTABLE CHEST - 1 VIEW  COMPARISON:  September 08, 2013.  FINDINGS: Interval placement of left subclavian catheter with distal tip in expected position of cavoatrial junction. No pneumothorax is noted. Hypoinflation of the lungs is noted. Mild central pulmonary vascular congestion is noted. Bony thorax appears intact.  IMPRESSION: Left subclavian catheter tip in expected position of cavoatrial junction. No pneumothorax is seen.   Electronically Signed   By: Sabino Dick M.D.   On: 10/27/2013 19:20   Dg Abd Portable 2v  10/27/2013   CLINICAL DATA:  Abdominal pain, questionable perforation  EXAM: PORTABLE ABDOMEN - 2 VIEW  COMPARISON:  CT ABD/PELV WO CM dated 10/26/2013  FINDINGS: There is motion artifact present on all views. No definite free extraluminal gas collections are demonstrated. The gas pattern is relatively nonspecific. The patient has undergone lower lumbar fusion.  IMPRESSION: There is no definite free extraluminal gas demonstrated. The study is limited due to motion artifact.   Electronically Signed   By: Katarina Riebe  [place]   On: 10/27/2013 14:38     Anti-infectives:   Anti-infectives   Start     Dose/Rate Route Frequency Ordered Stop   10/27/13 2200  vancomycin (VANCOCIN) 500 mg in sodium chloride irrigation 0.9 % 100 mL ENEMA     500 mg Rectal 4 times per day 10/27/13 2145     10/27/13 1400  vancomycin (VANCOCIN) 50 mg/mL oral solution 500 mg  Status:  Discontinued     500 mg Oral Every 6 hours 10/27/13 1321  10/27/13 2145   10/26/13 1800  vancomycin (VANCOCIN) 50 mg/mL oral solution 125 mg  Status:  Discontinued     125 mg Oral 4 times daily 10/26/13 1633 10/27/13 1321   10/26/13 0130  metroNIDAZOLE (FLAGYL) IVPB 500 mg     500 mg 100 mL/hr over 60 Minutes Intravenous Every 8 hours 10/26/13 0041     10/26/13 0100  ciprofloxacin (CIPRO) IVPB 400 mg     400 mg 200 mL/hr over 60 Minutes Intravenous Every 24 hours 10/26/13 0034        Alphonsa Overall, MD, FACS Pager: [facility] Office: [phone] 10/28/2013

## 2013-10-28 NOTE — Progress Notes (Signed)
[facility] PROGRESS NOTE  [name] [id number] DOB: 09-02-1951 DOA: 10/25/2013 PCP: [name], MD Interim summary: 71-yo BF PMHx depression, memory loss, frequent falls, hypertension, hyperlipidemia, depression, GERD, hypothyroidism,with history of lumbar spondylosis status post lumbar decompression surgery in January 2015, recently treated with antibiotic for UTI was brought in by family as patient complained of having loose / watery diarrhea of 3-4 episodes for past few days. Patient also has been noticed to be more confused and sleepy for past 2 days and her son feels that she may be taking some extra dose of oxycodone. Oxycodone was stopped. She was found to have infectious colitis of her ascending colon and caecum possibly from C DIFF and salmonella. Her stool pcr was positive for both. She was started on IV rocephin and IV flagyl along with Vancomycin enemas. 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 Her baseline creatinine is around 1. Her creatinine on admission is 2.94. Today her creatinine is 3.03.  Her leukocytosis is 51,000.    Assessment/Plan:  Right lower quadrant pain  - secondary to colitis of ascending colon and caecum and c diff colitis. .  - CT abdomen and pelvis shows the above.  - improving.  - on IV antibiotics.   Altered mental status/ acute metabolic encephalopathy probably secondary to acute renal failure and metabolic acidosis:   Patient reportedly more confused for past few days and sleepy. Son feels she was taking more oxycodone than usual. Will hold oxycodone. (She has oxycodone listed as allergy in the form of confusion) . CT head negative for stroke. -Continue neuro checks  -As she is on Wellbutrin, Lexapro for depression. She also takes an extremely high dose of Seroquel at bedtime for? Sleep disorder which I will hold for now until her mental status is improved.   Hypertension  Blood pressure stable. Holding ARB and HCTZ given acute  kidney injury.   GERD  Continue PPI   Anemia  Patient reports passing dark stools for the last few days. Stool for occult blood was negative. Hemoglobin stable   Unspecified hypothyroidism  Continue Synthroid.    Spondylolisthesis of lumbar region  Status post lumbar decompression surgery for L4/L5 lumbar spondylosis and L4/L5 lateral recess stenosis and January 2015 by Dr. Christella Noa.   Salmonella Colitis  - started pt on IV rocephin on 2/18. She was on ciprofloxacin the last 2 days.   Clostridium difficile  -10/26/2013 C. difficile positive by PCR  -Start antibiotic protocol (patient's WBC> 15 which places him severe category)  -Stool culture pending  - pt on IV flagyl and Vancomycin enemas   Acute on CKD stage 2/Metabolic acidosis. : - fena <1.  - probably a combination of intra vascular volume depletion and acute kidney injury.  - currently on IV normal saline 191ml/hr.  - bicarb improving.  - US RENAL ordered - repeat UA ordered today.  - urine sodium is 11 and creatinine is 445 - DR Florene Glen consulted for further recommendations.    Leukocytosis: worsening.  Changed antibiotics today to IV rocephin. If it doesn't start improving. Will get hematology consult for recommendations.       Code Status: full code Family Communication: none atbedside Disposition Plan: pending.    Consultants:  Surgery for acute abdomen.   Procedures:  CT abdomen and pelvis.   CVL on 2//17  Antibiotics:  IV CIPRO 2/16 TO 2/18  IV ROCEPHIN 2/18  IV FLAGYL 2. 16  VANCOMYCIN PO 2/16.   HPI/Subjective: Not in distress, not agitated.  Confused.   Objective: Filed Vitals:   10/28/13 0800  BP: 94/70  Pulse: 110  Temp:   Resp: 24    Intake/Output Summary (Last 24 hours) at 10/28/13 1017 Last data filed at 10/28/13 0900  Gross per 24 hour  Intake 6747.08 ml  Output    125 ml  Net 6622.08 ml   Filed Weights   10/26/13 0021 10/26/13 0040 10/28/13 0400  Weight: 117.1  kg (258 lb 2.5 oz) 107 kg (235 lb 14.3 oz) 113 kg (249 lb 1.9 oz)    Exam:   General:  ALERT , confused  Cardiovascular: S1S2, tachycardic.   Respiratory: ctab.   Abdomen: soft MILD GEN tenderness.   Musculoskeletal: no pedal edema.   Data Reviewed: Basic Metabolic Panel:  Recent Labs Lab 10/26/13 0553 10/27/13 0340 10/27/13 1410 10/27/13 1725 10/27/13 1912 10/28/13 0241  NA 135* 134* 137 138 138 136*  K 4.0 4.1 4.2 4.3 4.5 3.9  CL 97 100 102 104 104 101  CO2 18* 16* 14* 13* 15* 16*  GLUCOSE 138* 125* 127* 114* 116* 107*  BUN 52* 60* 64* 64* 63* 64*  CREATININE 2.80* 2.91* 2.91* 2.94* 3.02* 3.03*  CALCIUM 8.3* 7.6* 7.6* 7.6* 7.4* 7.2*  MG  --  1.8  --   --   --  1.8   Liver Function Tests:  Recent Labs Lab 10/25/13 2039 10/27/13 0340 10/27/13 1725 10/27/13 1912 10/28/13 0241  AST 42* 33 36 37 42*  ALT 23 21 20 19 21   ALKPHOS 97 91 91 82 77  BILITOT 0.7 0.6 0.6 0.5 0.5  PROT 6.3 5.3* 5.2* 5.0* 4.9*  ALBUMIN 2.5* 2.0* 2.0* 2.0* 1.9*   No results found for this basename: LIPASE, AMYLASE,  in the last 168 hours No results found for this basename: AMMONIA,  in the last 168 hours CBC:  Recent Labs Lab 10/25/13 2039 10/26/13 0553 10/27/13 0340 10/27/13 1410 10/28/13 0241  WBC 21.0* 23.3* 39.6* 46.1* 51.9*  NEUTROABS 15.2*  --  31.6*  --  43.0*  HGB 12.0 11.4* 12.3 11.7* 10.3*  HCT 33.5* 33.1* 35.2* 32.9* 29.8*  MCV 85.5 86.0 85.4 84.4 85.9  PLT 231 272 251 248 PLATELET CLUMPS NOTED ON SMEAR, COUNT APPEARS ADEQUATE   Cardiac Enzymes: No results found for this basename: CKTOTAL, CKMB, CKMBINDEX, TROPONINI,  in the last 168 hours BNP (last 3 results) No results found for this basename: PROBNP,  in the last 8760 hours CBG: No results found for this basename: GLUCAP,  in the last 168 hours  Recent Results (from the past 240 hour(s))  CLOSTRIDIUM DIFFICILE BY PCR     Status: Abnormal   Collection Time    10/26/13  4:18 AM      Result Value Ref  Range Status   C difficile by pcr POSITIVE (*) NEGATIVE Final   Comment: CRITICAL RESULT CALLED TO, READ BACK BY AND VERIFIED WITH:     TRACY RN 11:50 10/26/13 ([email or web address])     Performed at [facility]     Status: None   Collection Time    10/26/13  4:18 AM      Result Value Ref Range Status   Specimen Description STOOL   Final   Special Requests NONE   Final   Culture     Final   Value: Culture reincubated for better growth     Performed at [facility]   Report Status PENDING   Incomplete  MRSA PCR  SCREENING     Status: None   Collection Time    10/27/13  3:56 PM      Result Value Ref Range Status   MRSA by PCR NEGATIVE  NEGATIVE Final   Comment:            The GeneXpert MRSA Assay (FDA     approved for NASAL specimens     only), is one component of a     comprehensive MRSA colonization     surveillance program. It is not     intended to diagnose MRSA     infection nor to guide or     monitor treatment for     MRSA infections.     Studies: Ct Abdomen Pelvis Wo Contrast  10/26/2013   CLINICAL DATA:  Dark stool  EXAM: CT ABDOMEN AND PELVIS WITHOUT CONTRAST  TECHNIQUE: Multidetector CT imaging of the abdomen and pelvis was performed following the standard protocol without IV contrast.  COMPARISON:  None.  FINDINGS: Motion artifact, lack of intravenous contrast and body habitus markedly limits the examination.  Tiny pericardial effusion lateral to the left ventricle is noted.  There is wall thickening and inflammatory change involving the see come and ascending colon. The hepatic flexure, transverse colon, and distal colon are unremarkable allowing for technical quality of the scan.  There is no pneumatosis. No extraluminal bowel gas. There is no portal venous gas. There is fluid and inflammatory stranding lateral to the cecum and ascending colon, in the region of the right pericolic gutter. Inflammatory cecal nodes.  The appendix is not clearly identified.   Small amount of free fluid layers in the anterior pelvis.  Uterus and adnexa are unremarkable.  Bladder is decompressed.  Liver, gallbladder, spleen, pancreas, adrenal glands are within normal limits.  Postoperative changes from lumbar fusion are noted.  IMPRESSION: Inflammatory changes involving the cecum and ascending colon are noted. Differential diagnosis includes infectious colitis, ischemia, acute appendicitis, and typhlitis. There is no pneumatosis. No abscess. No extraluminal bowel gas.   Electronically Signed   By: Maryclare Bean M.D.   On: 10/26/2013 12:18   Dg Chest Port 1 View  10/28/2013   CLINICAL DATA:  Increasing cough, leukocytosis, sepsis.  EXAM: PORTABLE CHEST - 1 VIEW  COMPARISON:  DG CHEST 1V PORT dated 10/27/2013  FINDINGS: Cardiomediastinal silhouette is unremarkable for this low inspiratory portable examination with crowded vasculature markings. The lungs are clear without pleural effusions or focal consolidations. Trachea projects midline and there is no pneumothorax. Included soft tissue planes and osseous structures are non-suspicious. Multiple EKG lines overlie the patient and may obscure subtle underlying pathology. Left subclavian central venous catheter with distal tip projecting at cavoatrial junction.  IMPRESSION: No acute cardiopulmonary process for this low inspiratory portable examination.  No apparent change in position of life support line.   Electronically Signed   By: Elon Alas   On: 10/28/2013 04:36   Dg Chest Port 1 View  10/27/2013   CLINICAL DATA:  Central line placement.  EXAM: PORTABLE CHEST - 1 VIEW  COMPARISON:  September 08, 2013.  FINDINGS: Interval placement of left subclavian catheter with distal tip in expected position of cavoatrial junction. No pneumothorax is noted. Hypoinflation of the lungs is noted. Mild central pulmonary vascular congestion is noted. Bony thorax appears intact.  IMPRESSION: Left subclavian catheter tip in expected position of  cavoatrial junction. No pneumothorax is seen.   Electronically Signed   By: Sabino Dick M.D.   On:  10/27/2013 19:20   Dg Abd Portable 2v  10/27/2013   CLINICAL DATA:  Abdominal pain, questionable perforation  EXAM: PORTABLE ABDOMEN - 2 VIEW  COMPARISON:  CT ABD/PELV WO CM dated 10/26/2013  FINDINGS: There is motion artifact present on all views. No definite free extraluminal gas collections are demonstrated. The gas pattern is relatively nonspecific. The patient has undergone lower lumbar fusion.  IMPRESSION: There is no definite free extraluminal gas demonstrated. The study is limited due to motion artifact.   Electronically Signed   By: David  [place]   On: 10/27/2013 14:38    Scheduled Meds: . albuterol  2.5 mg Nebulization TID  . atorvastatin  10 mg Oral q1800  . cefTRIAXone (ROCEPHIN)  IV  1 g Intravenous Q24H  . cholecalciferol  1,000 Units Oral Daily  . famotidine (PEPCID) IV  20 mg Intravenous Q12H  . ferrous sulfate  325 mg Oral QPC breakfast  . heparin  5,000 Units Subcutaneous 3 times per day  . lamoTRIgine  200 mg Oral Daily  . levothyroxine  75 mcg Oral QAC breakfast  . LORazepam      . metronidazole  500 mg Intravenous Q8H  . potassium chloride SA  20 mEq Oral Daily  . vancomycin (VANCOCIN) rectal ENEMA  500 mg Rectal 4 times per day   Continuous Infusions: . sodium chloride 125 mL/hr at 10/28/13 0602    Principal Problem:   Acute renal failure Active Problems:   DEPRESSION   HYPERTENSION   Memory loss   ANEMIA   Unspecified hypothyroidism   Spondylolisthesis of lumbar region   Obesity   Right lower quadrant abdominal pain   Altered mental status   Acute kidney injury    Time spent: 39 MINUTES.     [facility] Pager [phone] If 7PM-7AM, please contact night-coverage at [email or web address], password [facility] 10/28/2013, 10:17 AM  LOS: 3 days

## 2013-10-28 NOTE — Progress Notes (Signed)
:    [id number] Rosana Hoes, RN, BSN, CCM, [phone] Chart reviewed for update of needs and condition. patient transferred from reg floor to sdu pm of [id number] due severe sepsis pt is positive for c.diff, Salmonella by PCR, drug screen positive for PCP, poss. bowel ischemia/surgical cons. completed/bun=60, creat 2.91/wbc=51.9,urine is cloudy with many wbc and leukocytes/ new review done to update condition and level of care.

## 2013-10-28 NOTE — Progress Notes (Signed)
Comment:   Notified by RN regarding pt's current LOC status. States pt currently SDU status but her current acuity supports ICU LOC.  Reports pt transferred from [facility] floor today d/t worsening respiratory status, AMS, renal function and soft BP. A CVL was placed by Dr Sherral Hammers and proper placement confirmed w/ PCXR. Pt remains acidotic w/ C02 of 13 and currently receiving Bicarb qtt. SBP's have remained in the 90's despite aggressive hydration and she remains very lethargic. Respiratory status appears to have stabilized and pt w/ 02 sats of 100% on 2L [place]. She has not had recent diarrhea. At bedside pt noted very restless. Will withdraw to pain but will not open her eyes or respond verbally. Skin w/d. Lungs essentially CTA. Pt noted very oliguric and is > 10L positive. Current VS BP-104/72, T-98.4, P-111, R-24 w/ 02 sats of 100% on 2L [place]. Surgery was consulted and does not believe surgery is indicated at this time but have agreed to follow. Nephrology consult may be initiated in AM. Will change pt to ICU status until am at which point she can be re-evaluated by rounding MD who can determine whether ICU LOC continues to be required. Will continue to monitor closely in ICU.  Jeryl Columbia, NP-C [facility] Pager [phone]

## 2013-10-28 NOTE — Progress Notes (Signed)
[facility] ADULT ICU REPLACEMENT PROTOCOL FOR AM LAB REPLACEMENT ONLY  The patient does not apply for the [facility] Adult ICU Electrolyte Replacment Protocol based on the criteria listed below:   1. Is GFR >/= 40 ml/min? no  Patient's GFR today is 18 2. Is urine output >/= 0.5 ml/kg/hr for the last 6 hours? no Patient's UOP is  ml/kg/hr 3. Is BUN < 60 mg/dL? no  Patient's BUN today is 64 4. Abnormal electrolyte(s): M 1.8 5. Ordered repletion with: NA 6. If a panic level lab has been reported, has the CCM MD in charge been notified? no.   Physician:    Ronda Fairly A 10/28/2013 5:33 AM

## 2013-10-28 NOTE — Telephone Encounter (Signed)
Spoke with the nursing director and informed her the pt has an upcoming appointment and will be evaluated at that time.   In the meantime, all orders need to come from the surgeon or specialist.

## 2013-10-29 LAB — CBC WITH DIFFERENTIAL/PLATELET
BAND NEUTROPHILS: 6 % (ref 0–10)
BASOS ABS: 0 10*3/uL (ref 0.0–0.1)
Basophils Relative: 0 % (ref 0–1)
Blasts: 0 %
EOS PCT: 0 % (ref 0–5)
Eosinophils Absolute: 0 10*3/uL (ref 0.0–0.7)
HEMATOCRIT: 27.6 % — AB (ref 36.0–46.0)
HEMOGLOBIN: 9.4 g/dL — AB (ref 12.0–15.0)
Lymphocytes Relative: 5 % — ABNORMAL LOW (ref 12–46)
Lymphs Abs: 1.7 10*3/uL (ref 0.7–4.0)
MCH: 29.7 pg (ref 26.0–34.0)
MCHC: 34.1 g/dL (ref 30.0–36.0)
MCV: 87.1 fL (ref 78.0–100.0)
MYELOCYTES: 4 %
Metamyelocytes Relative: 5 %
Monocytes Absolute: 1 10*3/uL (ref 0.1–1.0)
Monocytes Relative: 3 % (ref 3–12)
NEUTROS ABS: 30.8 10*3/uL — AB (ref 1.7–7.7)
NEUTROS PCT: 77 % (ref 43–77)
Platelets: 190 10*3/uL (ref 150–400)
Promyelocytes Absolute: 0 %
RBC: 3.17 MIL/uL — ABNORMAL LOW (ref 3.87–5.11)
RDW: 15.5 % (ref 11.5–15.5)
WBC: 33.5 10*3/uL — ABNORMAL HIGH (ref 4.0–10.5)
nRBC: 1 /100 WBC — ABNORMAL HIGH

## 2013-10-29 LAB — COMPREHENSIVE METABOLIC PANEL
ALT: 20 U/L (ref 0–35)
AST: 34 U/L (ref 0–37)
Albumin: 2.6 g/dL — ABNORMAL LOW (ref 3.5–5.2)
Alkaline Phosphatase: 83 U/L (ref 39–117)
BUN: 62 mg/dL — ABNORMAL HIGH (ref 6–23)
CALCIUM: 7.6 mg/dL — AB (ref 8.4–10.5)
CO2: 17 mEq/L — ABNORMAL LOW (ref 19–32)
Chloride: 109 mEq/L (ref 96–112)
Creatinine, Ser: 2.49 mg/dL — ABNORMAL HIGH (ref 0.50–1.10)
GFR calc non Af Amer: 20 mL/min — ABNORMAL LOW (ref >90)
GFR, EST AFRICAN AMERICAN: 23 mL/min — AB (ref >90)
GLUCOSE: 88 mg/dL (ref 70–99)
Potassium: 3.8 mEq/L (ref 3.7–5.3)
SODIUM: 145 meq/L (ref 137–147)
Total Bilirubin: 0.3 mg/dL (ref 0.3–1.2)
Total Protein: 5 g/dL — ABNORMAL LOW (ref 6.0–8.3)

## 2013-10-29 LAB — MAGNESIUM: Magnesium: 2 mg/dL (ref 1.5–2.5)

## 2013-10-29 NOTE — Evaluation (Signed)
Physical Therapy Evaluation Patient Details Name: [name] MRN: [id number] DOB: Nov 20, 1950 Today's Date: 10/29/2013 Time: 8101-7510 PT Time Calculation (min): 16 min  PT Assessment / Plan / Recommendation History of Present Illness  62-yo BF PMHx depression, memory loss, frequent falls, hypertension, hyperlipidemia, depression, GERD, hypothyroidism,with history of lumbar spondylosis status post lumbar decompression surgery in January 2015, recently treated with antibiotic for UTI was brought in by family as patient complained of having loose / watery diarrhea of 3-4 episodes for past few days. Patient also has been noticed to be more confused and sleepy for past 2 days and her son feels that she may be taking some extra dose of oxycodone. Oxycodone was stopped. She was found to have infectious colitis of her ascending colon and caecum possibly from C DIFF and salmonella. Her stool pcr was positive for both. She was started on IV rocephin and IV flagyl along with Vancomycin enemas. 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 Her baseline creatinine is around 1. Her creatinine on admission is 2.94.  Clinical Impression  Pt admitted with above. Pt currently with functional limitations due to the deficits listed below (see PT Problem List).  Pt will benefit from skilled PT to increase their independence and safety with mobility to allow discharge to the venue listed below.  No family present and pt with decreased cognition and inconsistent with following commands today with assist to EOB.  RN reports pt from home alone.  Recommend SNF upon d/c.     PT Assessment  Patient needs continued PT services    Follow Up Recommendations  SNF    Does the patient have the potential to tolerate intense rehabilitation      Barriers to Discharge        Equipment Recommendations  None recommended by PT (will be assessed if d/c home)    Recommendations for Other Services     Frequency Min 3X/week     Precautions / Restrictions Precautions Precautions: Fall   Pertinent Vitals/Pain VSS      Mobility  Bed Mobility Overal bed mobility: Needs Assistance;+2 for physical assistance Bed Mobility: Supine to Sit;Sit to Supine;Rolling Rolling: Total assist Supine to sit: Total assist;+2 for physical assistance Sit to supine: Mod assist;+2 for physical assistance General bed mobility comments: verbal cues for self assist, pt assist variable and at times resisting despite cues Transfers Overall transfer level:  (unsafe due to cognition to transfer at this time)    Exercises     PT Diagnosis: Difficulty walking;Altered mental status  PT Problem List: Decreased strength;Decreased activity tolerance;Decreased mobility;Decreased balance;Decreased knowledge of use of DME;Decreased safety awareness;Decreased cognition PT Treatment Interventions: DME instruction;Gait training;Functional mobility training;Patient/family education;Therapeutic activities;Therapeutic exercise     PT Goals(Current goals can be found in the care plan section) Acute Rehab PT Goals PT Goal Formulation: With patient Time For Goal Achievement: 11/05/13 Potential to Achieve Goals: Fair  Visit Information  Last PT Received On: 10/29/13 Assistance Needed: +2 History of Present Illness: 62-yo BF PMHx depression, memory loss, frequent falls, hypertension, hyperlipidemia, depression, GERD, hypothyroidism,with history of lumbar spondylosis status post lumbar decompression surgery in January 2015, recently treated with antibiotic for UTI was brought in by family as patient complained of having loose / watery diarrhea of 3-4 episodes for past few days. Patient also has been noticed to be more confused and sleepy for past 2 days and her son feels that she may be taking some extra dose of oxycodone. Oxycodone was stopped. She was found  to have infectious colitis of her ascending colon and caecum possibly from C DIFF and salmonella.  Her stool pcr was positive for both. She was started on IV rocephin and IV flagyl along with Vancomycin enemas. 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 Her baseline creatinine is around 1. Her creatinine on admission is 2.94.       Prior [place] expects to be discharged to:: Skilled nursing facility Living Arrangements: Alone Additional Comments: per chart pt lives alone and daughter would like d/c to SNF Prior Function Level of Independence: Independent Communication Communication: No difficulties    Cognition  Cognition Arousal/Alertness: Lethargic Behavior During Therapy: Agitated;Restless Overall Cognitive Status: No family/caregiver present to determine baseline cognitive functioning Area of Impairment: Following commands Following Commands: Follows one step commands inconsistently General Comments: pt did not answer home questions and would state yes and then no to mobilizing    Extremity/Trunk Assessment Lower Extremity Assessment Lower Extremity Assessment: Difficult to assess due to impaired cognition   Balance Balance Overall balance assessment: Needs assistance Sitting-balance support: Feet supported;Bilateral upper extremity supported Sitting balance-Leahy Scale: Fair Sitting balance - Comments: able to sit without support shortly then required assist, likely due to cognition  End of Session PT - End of Session Activity Tolerance: Patient limited by lethargy;Treatment limited secondary to agitation Patient left: in bed;with call bell/phone within reach  GP     [facility] E 10/29/2013, 5:03 PM Carmelia Bake, PT, DPT 10/29/2013 Pager: [phone]

## 2013-10-29 NOTE — Progress Notes (Signed)
SLP Cancellation Note  Patient Details Name: [name] MRN: [id number] DOB: 1950/12/05   Cancelled evaluation:      Per RN, pt has been quite restless, trying to climb out of bed, and just now fell asleep.  If schedule allows, will return in a few hours to attempt swallow eval.       [name] Laurice 10/29/2013, 1:23 PM

## 2013-10-29 NOTE — Progress Notes (Signed)
[id number] Rosana Hoes, RN, BSN, CCM, [phone] Chart reviewed for update of needs and condition. wbc 33.5/diarrhea continues and urine is positive for uti.

## 2013-10-29 NOTE — Progress Notes (Signed)
Assessment:  1 AKI, nonoliguric hemodynamically mediated due to hypovolemia due to GI losses, hypotension, diuretic and ARB....improved  2 Stage # 3 CKD  3 Hx of hypertension  4 Colitis with C. diff   Plan:  1 Cont to optimize hemodynamics,  colloid and IVFs    Subjective: Interval History: Increased UOP  Objective: Vital signs in last 24 hours: Temp:  [98.3 F (36.8 C)-98.7 F (37.1 C)] 98.7 F (37.1 C) (02/19 0400) Pulse Rate:  [97-108] 97 (02/19 0400) Resp:  [16-30] 19 (02/19 0604) BP: (95-119)/(59-74) 114/66 mmHg (02/19 0604) SpO2:  [100 %] 100 % (02/19 0400) Weight:  [114.1 kg (251 lb 8.7 oz)] 114.1 kg (251 lb 8.7 oz) (02/19 0400) Weight change: 1.1 kg (2 lb 6.8 oz)  Intake/Output from previous day: 02/18 0701 - 02/19 0700 In: 3864.6 [P.O.:90; I.V.:3124.6; IV Piggyback:650] Out: 450 [Urine:450] Intake/Output this shift:    General appearance: speaking in sentences with slurred speech Chest wall: no tenderness GI: tender lower abdomen Extremities: edema 1-2+ bilat  Lab Results:  Recent Labs  10/28/13 0241 10/29/13 0445  WBC 51.9* 33.5*  HGB 10.3* 9.4*  HCT 29.8* 27.6*  PLT PLATELET CLUMPS NOTED ON SMEAR, COUNT APPEARS ADEQUATE 190   BMET:  Recent Labs  10/28/13 0241 10/29/13 0445  NA 136* 145  K 3.9 3.8  CL 101 109  CO2 16* 17*  GLUCOSE 107* 88  BUN 64* 62*  CREATININE 3.03* 2.49*  CALCIUM 7.2* 7.6*   No results found for this basename: PTH,  in the last 72 hours Iron Studies: No results found for this basename: IRON, TIBC, TRANSFERRIN, FERRITIN,  in the last 72 hours Studies/Results: US Renal  10/28/2013   CLINICAL DATA:  Acute renal failure  EXAM: RENAL/URINARY TRACT ULTRASOUND COMPLETE  COMPARISON:  CT ABD/PELV WO CM dated 10/26/2013  FINDINGS: Right Kidney:  Length: 8.4 cm in length. Normal cortical echogenicity. No hydronephrosis. Trace right perinephric fluid.  Left Kidney:  Length: 9.3 cm in length. Normal echogenicity. No mass or  hydronephrosis.  Bladder:  Bladder is decompressed.  Additional findings: Right pleural effusion. Small amount of ascites. Right lower quadrant bowel wall thickening is noted.  IMPRESSION: No evidence of hydronephrosis.  Minimal right perinephric fluid.   Electronically Signed   By: Maryclare Bean M.D.   On: 10/28/2013 12:42   Dg Chest Port 1 View  10/28/2013   CLINICAL DATA:  Increasing cough, leukocytosis, sepsis.  EXAM: PORTABLE CHEST - 1 VIEW  COMPARISON:  DG CHEST 1V PORT dated 10/27/2013  FINDINGS: Cardiomediastinal silhouette is unremarkable for this low inspiratory portable examination with crowded vasculature markings. The lungs are clear without pleural effusions or focal consolidations. Trachea projects midline and there is no pneumothorax. Included soft tissue planes and osseous structures are non-suspicious. Multiple EKG lines overlie the patient and may obscure subtle underlying pathology. Left subclavian central venous catheter with distal tip projecting at cavoatrial junction.  IMPRESSION: No acute cardiopulmonary process for this low inspiratory portable examination.  No apparent change in position of life support line.   Electronically Signed   By: Elon Alas   On: 10/28/2013 04:36   Dg Chest Port 1 View  10/27/2013   CLINICAL DATA:  Central line placement.  EXAM: PORTABLE CHEST - 1 VIEW  COMPARISON:  September 08, 2013.  FINDINGS: Interval placement of left subclavian catheter with distal tip in expected position of cavoatrial junction. No pneumothorax is noted. Hypoinflation of the lungs is noted. Mild central pulmonary vascular congestion is  noted. Bony thorax appears intact.  IMPRESSION: Left subclavian catheter tip in expected position of cavoatrial junction. No pneumothorax is seen.   Electronically Signed   By: Sabino Dick M.D.   On: 10/27/2013 19:20   Dg Abd Portable 2v  10/27/2013   CLINICAL DATA:  Abdominal pain, questionable perforation  EXAM: PORTABLE ABDOMEN - 2 VIEW   COMPARISON:  CT ABD/PELV WO CM dated 10/26/2013  FINDINGS: There is motion artifact present on all views. No definite free extraluminal gas collections are demonstrated. The gas pattern is relatively nonspecific. The patient has undergone lower lumbar fusion.  IMPRESSION: There is no definite free extraluminal gas demonstrated. The study is limited due to motion artifact.   Electronically Signed   By: David  Martinique   On: 10/27/2013 14:38   Scheduled: . albumin human  25 g Intravenous Q6H  . albuterol  2.5 mg Nebulization TID  . cefTRIAXone (ROCEPHIN)  IV  1 g Intravenous Q24H  . cholecalciferol  1,000 Units Oral Daily  . famotidine (PEPCID) IV  20 mg Intravenous Q12H  . heparin  5,000 Units Subcutaneous 3 times per day  . lamoTRIgine  200 mg Oral Daily  . levothyroxine  38 mcg Intravenous QAC breakfast  . metronidazole  500 mg Intravenous Q8H  . potassium chloride SA  20 mEq Oral Daily  . vancomycin (VANCOCIN) rectal ENEMA  500 mg Rectal 4 times per day      LOS: 4 days   Jodine Muchmore C 10/29/2013,8:02 AM

## 2013-10-29 NOTE — Progress Notes (Signed)
[facility] PROGRESS NOTE  [name] [id number] DOB: 09-May-1951 DOA: 10/25/2013 PCP: [name], MD Interim summary: 87-yo BF PMHx depression, memory loss, frequent falls, hypertension, hyperlipidemia, depression, GERD, hypothyroidism,with history of lumbar spondylosis status post lumbar decompression surgery in January 2015, recently treated with antibiotic for UTI was brought in by family as patient complained of having loose / watery diarrhea of 3-4 episodes for past few days. Patient also has been noticed to be more confused and sleepy for past 2 days and her son feels that she may be taking some extra dose of oxycodone. Oxycodone was stopped. She was found to have infectious colitis of her ascending colon and caecum possibly from C DIFF and salmonella. Her stool pcr was positive for both. She was started on IV rocephin and IV flagyl along with Vancomycin enemas. 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 Her baseline creatinine is around 1. Her creatinine on admission is 2.94. Today her creatinine is 2.49  Her leukocytosis has improved to 33,000   Assessment/Plan:  Right lower quadrant pain  - secondary to colitis of ascending colon and caecum and c diff colitis. .  - CT abdomen and pelvis shows the above.  - improving.  - on IV antibiotics.   Altered mental status/ acute metabolic encephalopathy probably secondary to acute renal failure and metabolic acidosis:   Patient reportedly more confused for past few days and sleepy. Son feels she was taking more oxycodone than usual. Will hold oxycodone. (She has oxycodone listed as allergy in the form of confusion) . CT head negative for stroke. -As she is on Wellbutrin, Lexapro for depression. She also takes an extremely high dose of Seroquel at bedtime for? Sleep disorder which I will hold for now until her mental status is improved.  - 2/19 pt is more awake and communicative. Sister is at bedside and they are talking.   Spoke to daughter and sister, wants her to go to SNF on discharge.   Hypertension  Blood pressure stable. Holding ARB and HCTZ given acute kidney injury.   GERD  Continue PPI   Anemia  Patient reports passing dark stools for the last few days. Stool for occult blood was negative. Hemoglobin stable   Unspecified hypothyroidism  Continue Synthroid.    Spondylolisthesis of lumbar region  Status post lumbar decompression surgery for L4/L5 lumbar spondylosis and L4/L5 lateral recess stenosis and January 2015 by Dr. Christella Noa.   Salmonella Colitis  - started pt on IV rocephin on 2/18. She was on ciprofloxacin the last 2 days.  - 2 bm in the last 24 hours.   Clostridium difficile  -10/26/2013 C. difficile positive by PCR  -Start antibiotic protocol (patient's WBC> 15 which places him severe category)  -Stool culture pending  - pt on IV flagyl and Vancomycin enemas   Acute on CKD stage 2/Metabolic acidosis. : - fena <1.  - probably a combination of intra vascular volume depletion and acute kidney injury.  - currently on IV normal saline 157ml/hr.  - bicarb improving.  - US RENAL negative for hydronephrosis.  - repeat UA and urine cultures pending.  - urine sodium is 11 and creatinine is 445 - DR Florene Glen consulted, recommended colloids and fluids.    Leukocytosis: improving.  Changed antibiotics to IV rocephin. If it doesn't start improving. Will get hematology consult for recommendations.   Nutrition; pt is more awake today and SLP ordered to restart diet.       Code Status: full code Family Communication:  discussed with the daughter over the phone and sister at bedside Disposition Plan: pending PT eval. Social worker consulted.    Consultants:  Surgery for acute abdomen.   Procedures:  CT abdomen and pelvis.   CVL on 2//17  Antibiotics:  IV CIPRO 2/16 TO 2/18  IV ROCEPHIN 2/18  IV FLAGYL 2. 16  VANCOMYCIN PO 2/16.   HPI/Subjective: Not in distress, not  agitated. Confused.   Objective: Filed Vitals:   10/29/13 0604  BP: 114/66  Pulse:   Temp:   Resp: 19    Intake/Output Summary (Last 24 hours) at 10/29/13 1003 Last data filed at 10/29/13 0600  Gross per 24 hour  Intake   3000 ml  Output    450 ml  Net   2550 ml   Filed Weights   10/26/13 0040 10/28/13 0400 10/29/13 0400  Weight: 107 kg (235 lb 14.3 oz) 113 kg (249 lb 1.9 oz) 114.1 kg (251 lb 8.7 oz)    Exam:   General:  ALERT , confused  Cardiovascular: S1S2,   Respiratory: ctab.   Abdomen: soft no tenderness, bs+  Musculoskeletal: no pedal edema.   Data Reviewed: Basic Metabolic Panel:  Recent Labs Lab 10/26/13 0553 10/27/13 0340 10/27/13 1410 10/27/13 1725 10/27/13 1912 10/28/13 0241 10/29/13 0445  NA 135* 134* 137 138 138 136* 145  K 4.0 4.1 4.2 4.3 4.5 3.9 3.8  CL 97 100 102 104 104 101 109  CO2 18* 16* 14* 13* 15* 16* 17*  GLUCOSE 138* 125* 127* 114* 116* 107* 88  BUN 52* 60* 64* 64* 63* 64* 62*  CREATININE 2.80* 2.91* 2.91* 2.94* 3.02* 3.03* 2.49*  CALCIUM 8.3* 7.6* 7.6* 7.6* 7.4* 7.2* 7.6*  MG  --  1.8  --   --   --  1.8 2.0   Liver Function Tests:  Recent Labs Lab 10/27/13 0340 10/27/13 1725 10/27/13 1912 10/28/13 0241 10/29/13 0445  AST 33 36 37 42* 34  ALT 21 20 19 21 20   ALKPHOS 91 91 82 77 83  BILITOT 0.6 0.6 0.5 0.5 0.3  PROT 5.3* 5.2* 5.0* 4.9* 5.0*  ALBUMIN 2.0* 2.0* 2.0* 1.9* 2.6*   No results found for this basename: LIPASE, AMYLASE,  in the last 168 hours No results found for this basename: AMMONIA,  in the last 168 hours CBC:  Recent Labs Lab 10/25/13 2039 10/26/13 0553 10/27/13 0340 10/27/13 1410 10/28/13 0241 10/29/13 0445  WBC 21.0* 23.3* 39.6* 46.1* 51.9* 33.5*  NEUTROABS 15.2*  --  31.6*  --  43.0* 30.8*  HGB 12.0 11.4* 12.3 11.7* 10.3* 9.4*  HCT 33.5* 33.1* 35.2* 32.9* 29.8* 27.6*  MCV 85.5 86.0 85.4 84.4 85.9 87.1  PLT 231 272 251 248 PLATELET CLUMPS NOTED ON SMEAR, COUNT APPEARS ADEQUATE 190    Cardiac Enzymes: No results found for this basename: CKTOTAL, CKMB, CKMBINDEX, TROPONINI,  in the last 168 hours BNP (last 3 results) No results found for this basename: PROBNP,  in the last 8760 hours CBG: No results found for this basename: GLUCAP,  in the last 168 hours  Recent Results (from the past 240 hour(s))  CLOSTRIDIUM DIFFICILE BY PCR     Status: Abnormal   Collection Time    10/26/13  4:18 AM      Result Value Ref Range Status   C difficile by pcr POSITIVE (*) NEGATIVE Final   Comment: CRITICAL RESULT CALLED TO, READ BACK BY AND VERIFIED WITH:     TRACY RN 11:50 10/26/13 ([email or web address])  Performed at [facility]     Status: None   Collection Time    10/26/13  4:18 AM      Result Value Ref Range Status   Specimen Description STOOL   Final   Special Requests NONE   Final   Culture     Final   Value: Culture reincubated for better growth     Performed at [facility]   Report Status PENDING   Incomplete  MRSA PCR SCREENING     Status: None   Collection Time    10/27/13  3:56 PM      Result Value Ref Range Status   MRSA by PCR NEGATIVE  NEGATIVE Final   Comment:            The GeneXpert MRSA Assay (FDA     approved for NASAL specimens     only), is one component of a     comprehensive MRSA colonization     surveillance program. It is not     intended to diagnose MRSA     infection nor to guide or     monitor treatment for     MRSA infections.     Studies: US Renal  10/28/2013   CLINICAL DATA:  Acute renal failure  EXAM: RENAL/URINARY TRACT ULTRASOUND COMPLETE  COMPARISON:  CT ABD/PELV WO CM dated 10/26/2013  FINDINGS: Right Kidney:  Length: 8.4 cm in length. Normal cortical echogenicity. No hydronephrosis. Trace right perinephric fluid.  Left Kidney:  Length: 9.3 cm in length. Normal echogenicity. No mass or hydronephrosis.  Bladder:  Bladder is decompressed.  Additional findings: Right pleural effusion. Small amount of ascites. Right  lower quadrant bowel wall thickening is noted.  IMPRESSION: No evidence of hydronephrosis.  Minimal right perinephric fluid.   Electronically Signed   By: Maryclare Bean M.D.   On: 10/28/2013 12:42   Dg Chest Port 1 View  10/28/2013   CLINICAL DATA:  Increasing cough, leukocytosis, sepsis.  EXAM: PORTABLE CHEST - 1 VIEW  COMPARISON:  DG CHEST 1V PORT dated 10/27/2013  FINDINGS: Cardiomediastinal silhouette is unremarkable for this low inspiratory portable examination with crowded vasculature markings. The lungs are clear without pleural effusions or focal consolidations. Trachea projects midline and there is no pneumothorax. Included soft tissue planes and osseous structures are non-suspicious. Multiple EKG lines overlie the patient and may obscure subtle underlying pathology. Left subclavian central venous catheter with distal tip projecting at cavoatrial junction.  IMPRESSION: No acute cardiopulmonary process for this low inspiratory portable examination.  No apparent change in position of life support line.   Electronically Signed   By: Elon Alas   On: 10/28/2013 04:36   Dg Chest Port 1 View  10/27/2013   CLINICAL DATA:  Central line placement.  EXAM: PORTABLE CHEST - 1 VIEW  COMPARISON:  September 08, 2013.  FINDINGS: Interval placement of left subclavian catheter with distal tip in expected position of cavoatrial junction. No pneumothorax is noted. Hypoinflation of the lungs is noted. Mild central pulmonary vascular congestion is noted. Bony thorax appears intact.  IMPRESSION: Left subclavian catheter tip in expected position of cavoatrial junction. No pneumothorax is seen.   Electronically Signed   By: Sabino Dick M.D.   On: 10/27/2013 19:20   Dg Abd Portable 2v  10/27/2013   CLINICAL DATA:  Abdominal pain, questionable perforation  EXAM: PORTABLE ABDOMEN - 2 VIEW  COMPARISON:  CT ABD/PELV WO CM dated 10/26/2013  FINDINGS: There is  motion artifact present on all views. No definite free extraluminal  gas collections are demonstrated. The gas pattern is relatively nonspecific. The patient has undergone lower lumbar fusion.  IMPRESSION: There is no definite free extraluminal gas demonstrated. The study is limited due to motion artifact.   Electronically Signed   By: David  Martinique   On: 10/27/2013 14:38    Scheduled Meds: . albumin human  25 g Intravenous Q6H  . albuterol  2.5 mg Nebulization TID  . cefTRIAXone (ROCEPHIN)  IV  1 g Intravenous Q24H  . cholecalciferol  1,000 Units Oral Daily  . famotidine (PEPCID) IV  20 mg Intravenous Q12H  . heparin  5,000 Units Subcutaneous 3 times per day  . lamoTRIgine  200 mg Oral Daily  . levothyroxine  38 mcg Intravenous QAC breakfast  . metronidazole  500 mg Intravenous Q8H  . potassium chloride SA  20 mEq Oral Daily  . vancomycin (VANCOCIN) rectal ENEMA  500 mg Rectal 4 times per day   Continuous Infusions: . sodium chloride 1,000 mL (10/28/13 1427)    Principal Problem:   Acute renal failure Active Problems:   DEPRESSION   HYPERTENSION   Memory loss   ANEMIA   Unspecified hypothyroidism   Spondylolisthesis of lumbar region   Obesity   Right lower quadrant abdominal pain   Altered mental status   Acute kidney injury    Time spent: 64 MINUTES.     [place] [facility] Pager [phone] If 7PM-7AM, please contact night-coverage at [email or web address], password [facility] 10/29/2013, 10:03 AM  LOS: 4 days

## 2013-10-29 NOTE — Progress Notes (Signed)
Report called and given to 5th floor RN, Vira Agar.  Pt transferred via bed.  VSS.

## 2013-10-30 DIAGNOSIS — N390 Urinary tract infection, site not specified: Secondary | ICD-10-CM | POA: Insufficient documentation

## 2013-10-30 LAB — COMPREHENSIVE METABOLIC PANEL
ALBUMIN: 2.9 g/dL — AB (ref 3.5–5.2)
ALT: 23 U/L (ref 0–35)
AST: 43 U/L — AB (ref 0–37)
Alkaline Phosphatase: 58 U/L (ref 39–117)
BUN: 49 mg/dL — AB (ref 6–23)
CALCIUM: 8 mg/dL — AB (ref 8.4–10.5)
CO2: 16 mEq/L — ABNORMAL LOW (ref 19–32)
CREATININE: 1.68 mg/dL — AB (ref 0.50–1.10)
Chloride: 115 mEq/L — ABNORMAL HIGH (ref 96–112)
GFR calc Af Amer: 37 mL/min — ABNORMAL LOW (ref >90)
GFR calc non Af Amer: 32 mL/min — ABNORMAL LOW (ref >90)
Glucose, Bld: 101 mg/dL — ABNORMAL HIGH (ref 70–99)
Potassium: 3.6 mEq/L — ABNORMAL LOW (ref 3.7–5.3)
Sodium: 148 mEq/L — ABNORMAL HIGH (ref 137–147)
TOTAL PROTEIN: 5.1 g/dL — AB (ref 6.0–8.3)
Total Bilirubin: 0.2 mg/dL — ABNORMAL LOW (ref 0.3–1.2)

## 2013-10-30 LAB — URINE CULTURE
CULTURE: NO GROWTH
Colony Count: NO GROWTH

## 2013-10-30 LAB — CBC WITH DIFFERENTIAL/PLATELET
BASOS ABS: 0.2 10*3/uL — AB (ref 0.0–0.1)
Basophils Relative: 1 % (ref 0–1)
EOS ABS: 0 10*3/uL (ref 0.0–0.7)
Eosinophils Relative: 0 % (ref 0–5)
HEMATOCRIT: 26.9 % — AB (ref 36.0–46.0)
Hemoglobin: 9.4 g/dL — ABNORMAL LOW (ref 12.0–15.0)
LYMPHS ABS: 1.3 10*3/uL (ref 0.7–4.0)
Lymphocytes Relative: 7 % — ABNORMAL LOW (ref 12–46)
MCH: 30 pg (ref 26.0–34.0)
MCHC: 34.9 g/dL (ref 30.0–36.0)
MCV: 85.9 fL (ref 78.0–100.0)
MONO ABS: 2 10*3/uL — AB (ref 0.1–1.0)
Monocytes Relative: 11 % (ref 3–12)
NEUTROS ABS: 14.9 10*3/uL — AB (ref 1.7–7.7)
Neutrophils Relative %: 81 % — ABNORMAL HIGH (ref 43–77)
Platelets: 203 10*3/uL (ref 150–400)
RBC: 3.13 MIL/uL — ABNORMAL LOW (ref 3.87–5.11)
RDW: 16 % — AB (ref 11.5–15.5)
WBC: 18.4 10*3/uL — ABNORMAL HIGH (ref 4.0–10.5)

## 2013-10-30 LAB — MAGNESIUM: Magnesium: 2 mg/dL (ref 1.5–2.5)

## 2013-10-30 LAB — STOOL CULTURE

## 2013-10-30 LAB — PHOSPHORUS: PHOSPHORUS: 3.3 mg/dL (ref 2.3–4.6)

## 2013-10-30 MED ORDER — LIP MEDEX EX OINT
TOPICAL_OINTMENT | CUTANEOUS | Status: AC
  Filled 2013-10-30: qty 7

## 2013-10-30 MED ORDER — SODIUM CHLORIDE 0.9 % IJ SOLN
10.0000 mL | INTRAMUSCULAR | Status: DC | PRN
  Administered 2013-10-30: 10 mL
  Administered 2013-10-31: 20 mL
  Administered 2013-11-01 – 2013-11-03 (×6): 10 mL
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
## 2013-10-30 NOTE — Progress Notes (Signed)
[facility] PROGRESS NOTE  [name] JIR:[id number] DOB: 1951-06-14 DOA: 10/25/2013 PCP: Sydney Dawson, MD Interim summary: [name] PMHx depression, memory loss, frequent falls, hypertension, hyperlipidemia, depression, GERD, hypothyroidism,with history of lumbar spondylosis status post lumbar decompression surgery in January 2015, recently treated with antibiotic for UTI was brought in by family as patient complained of having loose / watery diarrhea of 3-4 episodes for past few days. Patient also has been noticed to be more confused and sleepy for past 2 days and her son feels that she may be taking some extra dose of oxycodone. Oxycodone was stopped. She was found to have infectious colitis of her ascending colon and caecum possibly from C DIFF and salmonella. Her stool pcr was positive for both. She was started on IV rocephin and IV flagyl along with Vancomycin enemas. 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 Her baseline creatinine is around 1. Her creatinine on admission is 2.94. Today her creatinine is 2.49  Her leukocytosis has improved to 33,000   Assessment/Plan:  Right lower quadrant pain  - secondary to colitis of ascending colon and caecum and c diff colitis. .  - CT abdomen and pelvis shows the above.  - improving.  - on IV antibiotics.   Altered mental status/ acute metabolic encephalopathy probably secondary to acute renal failure and metabolic acidosis:   Patient reportedly more confused for past few days and sleepy. Son feels she was taking more oxycodone than usual. Will hold oxycodone. (She has oxycodone listed as allergy in the form of confusion) . CT head negative for stroke. -As she is on Wellbutrin, Lexapro for depression. She also takes an extremely high dose of Seroquel at bedtime for? Sleep disorder which I will hold for now until her mental status is improved.  - 2/19 pt is more awake and communicative. Sister is at bedside and they are talking.   Spoke to daughter and sister, wants her to go to SNF on discharge.   Hypertension  Blood pressure stable. Holding ARB and HCTZ given acute kidney injury.   GERD  Continue PPI   Anemia  Patient reports passing dark stools for the last few days. Stool for occult blood was negative. Hemoglobin stable   Unspecified hypothyroidism  Continue Synthroid.    Spondylolisthesis of lumbar region  Status post lumbar decompression surgery for L4/L5 lumbar spondylosis and L4/L5 lateral recess stenosis and January 2015 by Dr. Christella Johnson.   Salmonella Colitis  - started pt on IV rocephin on 2/18. She was on ciprofloxacin the last 2 days.  - 1 bm in the last 24 hours.   Clostridium difficile  -10/26/2013 C. difficile positive by PCR  -Start antibiotic protocol (patient's WBC> 15 which places him severe category)  -Stool culture pending  - pt on IV flagyl and Vancomycin enemas   Acute on CKD stage 2/Metabolic acidosis. : - improving.  - fena <1.  - probably a combination of intra vascular volume depletion and acute kidney injury.  - currently on IV normal saline 122ml/hr.  - bicarb improving.  - US RENAL negative for hydronephrosis.  - repeat UA and urine cultures pending.  - urine sodium is 11 and creatinine is 445 - DR [name] consulted, recommended colloids and fluids.    Leukocytosis: improving.  Changed antibiotics to IV rocephin.   Nutrition; pt is more awake today and SLP ordered to restart diet. She failed SLP as she is       Code Status: full code Family Communication: discussed with  the daughter over the phone and sister at bedside Disposition Plan: pending PT eval. Social worker consulted.    Consultants:  Surgery for acute abdomen.   Procedures:  CT abdomen and pelvis.   CVL on 2//17  Antibiotics:  IV CIPRO 2/16 TO 2/18  IV ROCEPHIN 2/18  IV FLAGYL 2. 16  VANCOMYCIN PO 2/16.   HPI/Subjective: Not in distress, not agitated. Confused.    Objective: Filed Vitals:   10/30/13 1400  BP: 137/82  Pulse: 95  Temp: 97.4 F (36.3 C)  Resp: 18    Intake/Output Summary (Last 24 hours) at 10/30/13 1905 Last data filed at 10/30/13 1800  Gross per 24 hour  Intake   3075 ml  Output    450 ml  Net   2625 ml   Filed Weights   10/26/13 0040 10/28/13 0400 10/29/13 0400  Weight: 107 kg (235 lb 14.3 oz) 113 kg (249 lb 1.9 oz) 114.1 kg (251 lb 8.7 oz)    Exam:   General:  ALERT , confused  Cardiovascular: S1S2,   Respiratory: ctab.   Abdomen: soft no tenderness, bs+  Musculoskeletal: no pedal edema.   Data Reviewed: Basic Metabolic Panel:  Recent Labs Lab 10/26/13 0553 10/27/13 0340  10/27/13 1725 10/27/13 1912 10/28/13 0241 10/29/13 0445 10/30/13 0605  NA 135* 134*  < > 138 138 136* 145 148*  K 4.0 4.1  < > 4.3 4.5 3.9 3.8 3.6*  CL 97 100  < > 104 104 101 109 115*  CO2 18* 16*  < > 13* 15* 16* 17* 16*  GLUCOSE 138* 125*  < > 114* 116* 107* 88 101*  BUN 52* 60*  < > 64* 63* 64* 62* 49*  CREATININE 2.80* 2.91*  < > 2.94* 3.02* 3.03* 2.49* 1.68*  CALCIUM 8.3* 7.6*  < > 7.6* 7.4* 7.2* 7.6* 8.0*  MG  --  1.8  --   --   --  1.8 2.0 2.0  PHOS  --   --   --   --   --   --   --  3.3  < > = values in this interval not displayed. Liver Function Tests:  Recent Labs Lab 10/27/13 1725 10/27/13 1912 10/28/13 0241 10/29/13 0445 10/30/13 0605  AST 36 37 42* 34 43*  ALT 20 19 21 20 23   ALKPHOS 91 82 77 83 58  BILITOT 0.6 0.5 0.5 0.3 0.2*  PROT 5.2* 5.0* 4.9* 5.0* 5.1*  ALBUMIN 2.0* 2.0* 1.9* 2.6* 2.9*   No results found for this basename: LIPASE, AMYLASE,  in the last 168 hours No results found for this basename: AMMONIA,  in the last 168 hours CBC:  Recent Labs Lab 10/25/13 2039  10/27/13 0340 10/27/13 1410 10/28/13 0241 10/29/13 0445 10/30/13 0605  WBC 21.0*  < > 39.6* 46.1* 51.9* 33.5* 18.4*  NEUTROABS 15.2*  --  31.6*  --  43.0* 30.8* 14.9*  HGB 12.0  < > 12.3 11.7* 10.3* 9.4* 9.4*  HCT 33.5*   < > 35.2* 32.9* 29.8* 27.6* 26.9*  MCV 85.5  < > 85.4 84.4 85.9 87.1 85.9  PLT 231  < > 251 248 PLATELET CLUMPS NOTED ON SMEAR, COUNT APPEARS ADEQUATE 190 203  < > = values in this interval not displayed. Cardiac Enzymes: No results found for this basename: CKTOTAL, CKMB, CKMBINDEX, TROPONINI,  in the last 168 hours BNP (last 3 results) No results found for this basename: PROBNP,  in the last 8760 hours CBG: No  results found for this basename: GLUCAP,  in the last 168 hours  Recent Results (from the past 240 hour(s))  CLOSTRIDIUM DIFFICILE BY PCR     Status: Abnormal   Collection Time    10/26/13  4:18 AM      Result Value Ref Range Status   C difficile by pcr POSITIVE (*) NEGATIVE Final   Comment: CRITICAL RESULT CALLED TO, READ BACK BY AND VERIFIED WITH:     TRACY RN 11:50 10/26/13 ([email or web address])     Performed at [facility]  STOOL CULTURE     Status: None   Collection Time    10/26/13  4:18 AM      Result Value Ref Range Status   Specimen Description STOOL   Final   Special Requests NONE   Final   Culture     Final   Value: NO SALMONELLA, SHIGELLA, CAMPYLOBACTER, YERSINIA, OR E.COLI 0157:H7 ISOLATED     Note: REDUCED NORMAL FLORA PRESENT     Performed at [facility]   Report Status 10/30/2013 FINAL   Final  MRSA PCR SCREENING     Status: None   Collection Time    10/27/13  3:56 PM      Result Value Ref Range Status   MRSA by PCR NEGATIVE  NEGATIVE Final   Comment:            The GeneXpert MRSA Assay (FDA     approved for NASAL specimens     only), is one component of a     comprehensive MRSA colonization     surveillance program. It is not     intended to diagnose MRSA     infection nor to guide or     monitor treatment for     MRSA infections.  URINE CULTURE     Status: None   Collection Time    10/28/13 12:00 PM      Result Value Ref Range Status   Specimen Description URINE, CLEAN CATCH   Final   Special Requests NONE   Final   Culture  Setup Time      Final   Value: 10/29/2013 13:59     Performed at [facility] Count     Final   Value: NO GROWTH     Performed at [facility]   Culture     Final   Value: NO GROWTH     Performed at [facility]   Report Status 10/30/2013 FINAL   Final     Studies: No results found.  Scheduled Meds: . albuterol  2.5 mg Nebulization TID  . cefTRIAXone (ROCEPHIN)  IV  1 g Intravenous Q24H  . cholecalciferol  1,000 Units Oral Daily  . famotidine (PEPCID) IV  20 mg Intravenous Q12H  . heparin  5,000 Units Subcutaneous 3 times per day  . lamoTRIgine  200 mg Oral Daily  . levothyroxine  38 mcg Intravenous QAC breakfast  . lip balm      . metronidazole  500 mg Intravenous Q8H  . vancomycin (VANCOCIN) rectal ENEMA  500 mg Rectal 4 times per day   Continuous Infusions: . sodium chloride 125 mL/hr at 10/30/13 7062    Principal Problem:   Acute renal failure Active Problems:   DEPRESSION   HYPERTENSION   Memory loss   ANEMIA   Unspecified hypothyroidism   Spondylolisthesis of lumbar region   Obesity   Right lower quadrant abdominal pain  Altered mental status   Acute kidney injury   UTI (urinary tract infection)    Time spent: 45 MINUTES.     [facility] Pager [phone] If 7PM-7AM, please contact night-coverage at [email or web address], password [facility] 10/30/2013, 7:05 PM  LOS: 5 days

## 2013-10-30 NOTE — Progress Notes (Signed)
Clinical Social Work Department BRIEF PSYCHOSOCIAL ASSESSMENT 10/30/2013  Patient:  [name], [name]     Account Number:  [id number]     Admit date:  10/25/2013  Clinical Social Worker:  Lacie Scotts  Date/Time:  10/30/2013 03:45 PM  Referred by:  Physician  Date Referred:  10/30/2013 Referred for  SNF Placement   Other Referral:   Interview type:   Other interview type:    PSYCHOSOCIAL DATA Living Status:  ALONE Admitted from facility:   Level of care:   Primary support name:  [name] Primary support relationship to patient:  CHILD, ADULT Degree of support available:   supportive    CURRENT CONCERNS Current Concerns  Post-Acute Placement   Other Concerns:    SOCIAL WORK ASSESSMENT / PLAN Pt is a 63 yr old female living at home prior to hospitalization. CSW met with pt / daughter to assist with d/c planning. Pt had been d/c from [facility]  about a week ago. Daughter states she was not managing well at home. SNF placement will be needed following hospital d/c. Daughter states LTC / ALF placement will be needed following rehab. CSW will provide medicaid application to family. Pt has only [employer] and has used her first 20 days of coverage at [employer]. SNF search has been initiated and bed offers will be provided today.   Assessment/plan status:  Psychosocial Support/Ongoing Assessment of Needs Other assessment/ plan:   Information/referral to community resources:   SNF list with bed offers and medicaid application to be provided. Insurance coverage for SNF reviewed.    PATIENT'S/FAMILY'S RESPONSE TO PLAN OF CARE: Family is concerned regarding payment for pt's care. Daughter will assist with medicaid application process. Both pt / daughter agree that placement is needed.   Werner Lean LCSW [phone]

## 2013-10-30 NOTE — Progress Notes (Signed)
Subjective: Pt remains very confused and will not open her eyes or follow any instructions.  She has mittens on.  She is not speaking or responding to questions.  Objective: Vital signs in last 24 hours: Temp:  [97.3 F (36.3 C)-99.2 F (37.3 C)] 97.3 F (36.3 C) (02/20 0538) Pulse Rate:  [94-100] 100 (02/20 0538) Resp:  [16-26] 18 (02/20 0538) BP: (103-134)/(58-85) 134/85 mmHg (02/20 0538) SpO2:  [97 %-100 %] 100 % (02/20 0538) Last BM Date: 10/29/13 1 stool recorded NPO Afebrile, VSS Creatinine is improving WBC is down to 18.4 Intake/Output from previous day: 02/19 0701 - 02/20 0700 In: 2841.3 [I.V.:2331.3; IV Piggyback:250] Out: 925 [Urine:925] Intake/Output this shift:   General appearance: confused and cannot respond to questions or follow directions. She is no distress. GI: soft, non-tender; bowel sounds normal; no masses,  no organomegaly  Lab Results:   Recent Labs  10/29/13 0445 10/30/13 0605  WBC 33.5* 18.4*  HGB 9.4* 9.4*  HCT 27.6* 26.9*  PLT 190 203    BMET  Recent Labs  10/29/13 0445 10/30/13 0605  NA 145 148*  K 3.8 3.6*  CL 109 115*  CO2 17* 16*  GLUCOSE 88 101*  BUN 62* 49*  CREATININE 2.49* 1.68*  CALCIUM 7.6* 8.0*   PT/INR  Recent Labs  10/27/13 1410  LABPROT 15.9*  INR 1.30     Recent Labs Lab 10/27/13 1725 10/27/13 1912 10/28/13 0241 10/29/13 0445 10/30/13 0605  AST 36 37 42* 34 43*  ALT 20 19 21 20 23   ALKPHOS 91 82 77 83 58  BILITOT 0.6 0.5 0.5 0.3 0.2*  PROT 5.2* 5.0* 4.9* 5.0* 5.1*  ALBUMIN 2.0* 2.0* 1.9* 2.6* 2.9*     Lipase  No results found for this basename: lipase     Studies/Results: US Renal  10/28/2013   CLINICAL DATA:  Acute renal failure  EXAM: RENAL/URINARY TRACT ULTRASOUND COMPLETE  COMPARISON:  CT ABD/PELV WO CM dated 10/26/2013  FINDINGS: Right Kidney:  Length: 8.4 cm in length. Normal cortical echogenicity. No hydronephrosis. Trace right perinephric fluid.  Left Kidney:  Length: 9.3 cm in  length. Normal echogenicity. No mass or hydronephrosis.  Bladder:  Bladder is decompressed.  Additional findings: Right pleural effusion. Small amount of ascites. Right lower quadrant bowel wall thickening is noted.  IMPRESSION: No evidence of hydronephrosis.  Minimal right perinephric fluid.   Electronically Signed   By: Maryclare Bean M.D.   On: 10/28/2013 12:42    Medications: . albuterol  2.5 mg Nebulization TID  . cefTRIAXone (ROCEPHIN)  IV  1 g Intravenous Q24H  . cholecalciferol  1,000 Units Oral Daily  . famotidine (PEPCID) IV  20 mg Intravenous Q12H  . heparin  5,000 Units Subcutaneous 3 times per day  . lamoTRIgine  200 mg Oral Daily  . levothyroxine  38 mcg Intravenous QAC breakfast  . metronidazole  500 mg Intravenous Q8H  . potassium chloride SA  20 mEq Oral Daily  . vancomycin (VANCOCIN) rectal ENEMA  500 mg Rectal 4 times per day    Assessment/Plan 1. C Diff/Salmonella colitis/sepsis  Flagyl/Cipro - 2/16 >>>   Vanc enemas - 2/17 >>>  2. Renal failure Improving Creatinine - 3.03 - 10/28/2013  Now 1.68 3. Encephalopathy 4. Recent back surgery - L4/5 decompression - [facility] [place]   Hospitalized 1/9/- 09/25/2013  5. Bipolar/anxiety/depression/sleep disorder with multiple medications  6. Hypertension  7. Hx of GERD  8. Hypothyroid  9. On chronic disability  10. DVT prophylaxis -  SQ Heparin  Plan:  From a surgical standpoint there is nothing for [place] to add at this point.  Please call if we can help.   LOS: 5 days   JENNINGS,WILLARD 10/30/2013  Agree with above.  Alphonsa Overall, MD, [facility] Pager: [phone] Office phone:  [phone]

## 2013-10-30 NOTE — Progress Notes (Signed)
Clinical Social Work Department CLINICAL SOCIAL WORK PLACEMENT NOTE 10/30/2013  Patient:  SALEM, [name]  Account Number:  [id number] Admit date:  10/25/2013  Clinical Social Worker:  Werner Lean, LCSW  Date/time:  10/30/2013 03:58 PM  Clinical Social Work is seeking post-discharge placement for this patient at the following level of care:   SKILLED NURSING   (*CSW will update this form in Epic as items are completed)   10/30/2013  Patient/family provided with [facility] Department of Clinical Social Work's list of facilities offering this level of care within the geographic area requested by the patient (or if unable, by the patient's family).  10/30/2013  Patient/family informed of their freedom to choose among providers that offer the needed level of care, that participate in Medicare, Medicaid or managed care program needed by the patient, have an available bed and are willing to accept the patient.    Patient/family informed of [facility]' ownership interest in [facility], as well as of the fact that they are under no obligation to receive care at this facility.  PASARR submitted to EDS on 10/30/2013 PASARR number received from EDS on 10/30/2013  FL2 transmitted to all facilities in geographic area requested by pt/family on  10/30/2013 FL2 transmitted to all facilities within larger geographic area on   Patient informed that his/her managed care company has contracts with or will negotiate with  certain facilities, including the following:     Patient/family informed of bed offers received:   Patient chooses bed at  Physician recommends and patient chooses bed at    Patient to be transferred to  on   Patient to be transferred to facility by   The following physician request were entered in Epic:   Additional Comments:   Werner Lean LCSW [phone]

## 2013-10-30 NOTE — Progress Notes (Signed)
Assessment:  1 Recovery phase of AKI, nonoliguric hemodynamically mediated due to hypovolemia due to GI losses, hypotension, diuretic and ARB  2 Stage # 3 CKD  3 Hx of hypertension  4 Colitis with C. diff   Plan:  1 Cont to optimize hemodynamics and IVFs prn.  I will sign off . Thank you.  Subjective: Interval History: Improved UOP  Objective: Vital signs in last 24 hours: Temp:  [97.3 F (36.3 C)-99.2 F (37.3 C)] 97.3 F (36.3 C) (02/20 0538) Pulse Rate:  [94-100] 100 (02/20 0538) Resp:  [16-25] 18 (02/20 0538) BP: (103-134)/(58-85) 134/85 mmHg (02/20 0538) SpO2:  [97 %-100 %] 100 % (02/20 0538) Weight change:   Intake/Output from previous day: 02/19 0701 - 02/20 0700 In: 2841.3 [I.V.:2331.3; IV Piggyback:250] Out: 925 [Urine:925] Intake/Output this shift:    General appearance: slowed mentation and less alert today, not speaking in sentences GI: soft, non-tender; bowel sounds normal; no masses,  no organomegaly and protuberant Extremities: edema 1+  Lab Results:  Recent Labs  10/29/13 0445 10/30/13 0605  WBC 33.5* 18.4*  HGB 9.4* 9.4*  HCT 27.6* 26.9*  PLT 190 203   BMET:  Recent Labs  10/29/13 0445 10/30/13 0605  NA 145 148*  K 3.8 3.6*  CL 109 115*  CO2 17* 16*  GLUCOSE 88 101*  BUN 62* 49*  CREATININE 2.49* 1.68*  CALCIUM 7.6* 8.0*   No results found for this basename: PTH,  in the last 72 hours Iron Studies: No results found for this basename: IRON, TIBC, TRANSFERRIN, FERRITIN,  in the last 72 hours Studies/Results: US Renal  10/28/2013   CLINICAL DATA:  Acute renal failure  EXAM: RENAL/URINARY TRACT ULTRASOUND COMPLETE  COMPARISON:  CT ABD/PELV WO CM dated 10/26/2013  FINDINGS: Right Kidney:  Length: 8.4 cm in length. Normal cortical echogenicity. No hydronephrosis. Trace right perinephric fluid.  Left Kidney:  Length: 9.3 cm in length. Normal echogenicity. No mass or hydronephrosis.  Bladder:  Bladder is decompressed.  Additional findings: Right  pleural effusion. Small amount of ascites. Right lower quadrant bowel wall thickening is noted.  IMPRESSION: No evidence of hydronephrosis.  Minimal right perinephric fluid.   Electronically Signed   By: Maryclare Bean M.D.   On: 10/28/2013 12:42   Scheduled: . albuterol  2.5 mg Nebulization TID  . cefTRIAXone (ROCEPHIN)  IV  1 g Intravenous Q24H  . cholecalciferol  1,000 Units Oral Daily  . famotidine (PEPCID) IV  20 mg Intravenous Q12H  . heparin  5,000 Units Subcutaneous 3 times per day  . lamoTRIgine  200 mg Oral Daily  . levothyroxine  38 mcg Intravenous QAC breakfast  . metronidazole  500 mg Intravenous Q8H  . potassium chloride SA  20 mEq Oral Daily  . vancomycin (VANCOCIN) rectal ENEMA  500 mg Rectal 4 times per day    LOS: 5 days   Shayne Deerman C 10/30/2013,8:28 AM

## 2013-10-30 NOTE — Progress Notes (Signed)
ANTIBIOTIC CONSULT NOTE - FOLLOW UP  Pharmacy Consult for PO vancomycin, Ceftriaxone Indication: severe C.diff, Salmonella colitis  Allergies  Allergen Reactions  . Aspirin     REACTION: rash   Vital Signs: Temp: 97.3 F (36.3 C) (02/20 0538) Temp src: Axillary (02/20 0538) BP: 134/85 mmHg (02/20 0538) Pulse Rate: 100 (02/20 0538) Intake/Output from previous day: 02/19 0701 - 02/20 0700 In: 2841.3 [I.V.:2331.3; IV Piggyback:250] Out: 925 [Urine:925]  Labs:  Recent Labs  10/28/13 0241 10/29/13 0445 10/30/13 0605  WBC 51.9* 33.5* 18.4*  HGB 10.3* 9.4* 9.4*  PLT PLATELET CLUMPS NOTED ON SMEAR, COUNT APPEARS ADEQUATE 190 203  CREATININE 3.03* 2.49* 1.68*   Estimated Creatinine Clearance: 44.5 ml/min (by C-G formula based on Cr of 1.68).  Recent Labs  10/28/13 1430  VANCORANDOM <5.0     Assessment: 52 yoF on D#3 Ceftriaxone (s/p 3 days Cipro) for infectious colitis w/ salmonella, D6 Flagyl 500mg  IV q8h and, D5 PO vanc now as ENEMA for severe complicated c.diff colitis. CT abd/pelvis shows inflammatory changes-differential includes infectious colitis, ischemia, acute appendicitis, and typhlitis. Surgery, renal signed off today.   2/16 >> Cipro >> 2/17 2/16 >> Flagyl >> 2/16 >> vanc PO >> dose increased 2/17, rectal 2/18 >> Ceftriaxone >>   Tmax: afebrile WBCs: improved to 18.4K Renal: Scr improved to 1.68, (baseline appears 1.3-1.5), CG 44 ml/min  2/16 C.Diff >> positive 2/16 stool cx >> reincubated 2/16 GI pathogen PCR: (+) C.diff toxin and salmonella   Plan:   Continue Rocephin 1g IV q24h  Continue Flagyl 500mg  IV q8h  Continue Vancomycin enema 500mg  q6h  F/U need for vanc and Flagyl for c.diff or if can de-escalate  Vanessa Ravanna, PharmD, BCPS Pager: [phone] 1:17 PM Pharmacy #: [id number]

## 2013-10-31 LAB — COMPREHENSIVE METABOLIC PANEL
ALT: 36 U/L — AB (ref 0–35)
AST: 83 U/L — AB (ref 0–37)
Albumin: 2.8 g/dL — ABNORMAL LOW (ref 3.5–5.2)
Alkaline Phosphatase: 74 U/L (ref 39–117)
BUN: 39 mg/dL — ABNORMAL HIGH (ref 6–23)
CALCIUM: 8.3 mg/dL — AB (ref 8.4–10.5)
CO2: 17 mEq/L — ABNORMAL LOW (ref 19–32)
Chloride: 122 mEq/L — ABNORMAL HIGH (ref 96–112)
Creatinine, Ser: 1.38 mg/dL — ABNORMAL HIGH (ref 0.50–1.10)
GFR calc Af Amer: 46 mL/min — ABNORMAL LOW (ref >90)
GFR calc non Af Amer: 40 mL/min — ABNORMAL LOW (ref >90)
GLUCOSE: 120 mg/dL — AB (ref 70–99)
Potassium: 3.7 mEq/L (ref 3.7–5.3)
Sodium: 155 mEq/L — ABNORMAL HIGH (ref 137–147)
TOTAL PROTEIN: 5.2 g/dL — AB (ref 6.0–8.3)
Total Bilirubin: 0.2 mg/dL — ABNORMAL LOW (ref 0.3–1.2)

## 2013-10-31 LAB — CBC WITH DIFFERENTIAL/PLATELET
BASOS ABS: 0.1 10*3/uL (ref 0.0–0.1)
BASOS PCT: 1 % (ref 0–1)
EOS ABS: 0 10*3/uL (ref 0.0–0.7)
Eosinophils Relative: 0 % (ref 0–5)
HEMATOCRIT: 29.8 % — AB (ref 36.0–46.0)
HEMOGLOBIN: 10.1 g/dL — AB (ref 12.0–15.0)
Lymphocytes Relative: 12 % (ref 12–46)
Lymphs Abs: 1.6 10*3/uL (ref 0.7–4.0)
MCH: 29.6 pg (ref 26.0–34.0)
MCHC: 33.9 g/dL (ref 30.0–36.0)
MCV: 87.4 fL (ref 78.0–100.0)
MONOS PCT: 10 % (ref 3–12)
Monocytes Absolute: 1.3 10*3/uL — ABNORMAL HIGH (ref 0.1–1.0)
NEUTROS ABS: 10.1 10*3/uL — AB (ref 1.7–7.7)
NEUTROS PCT: 77 % (ref 43–77)
Platelets: 198 10*3/uL (ref 150–400)
RBC: 3.41 MIL/uL — ABNORMAL LOW (ref 3.87–5.11)
RDW: 16.6 % — ABNORMAL HIGH (ref 11.5–15.5)
WBC: 13.1 10*3/uL — AB (ref 4.0–10.5)

## 2013-10-31 LAB — MAGNESIUM: Magnesium: 1.9 mg/dL (ref 1.5–2.5)

## 2013-10-31 MED ORDER — DEXTROSE 5 % IV SOLN
INTRAVENOUS | Status: DC
  Administered 2013-10-31 – 2013-11-03 (×6): via INTRAVENOUS

## 2013-10-31 MED ORDER — METOPROLOL TARTRATE 1 MG/ML IV SOLN
2.5000 mg | Freq: Two times a day (BID) | INTRAVENOUS | Status: DC
  Administered 2013-10-31 – 2013-11-02 (×5): 2.5 mg via INTRAVENOUS
  Administered 2013-11-02: 22:00:00 via INTRAVENOUS
  Administered 2013-11-03: 2.5 mg via INTRAVENOUS
  Filled 2013-10-31 (×8): qty 5

## 2013-10-31 MED ORDER — HYDRALAZINE HCL 20 MG/ML IJ SOLN
5.0000 mg | Freq: Four times a day (QID) | INTRAMUSCULAR | Status: DC | PRN
  Filled 2013-10-31: qty 0.25

## 2013-10-31 NOTE — Progress Notes (Signed)
Speech Language Pathology Treatment: Dysphagia  Patient Details Name: [name] MRN: [id number] DOB: 1951-05-13 Today's Date: 10/31/2013 Time: 4098-1191 SLP Time Calculation (min): 35 min  Assessment / Plan / Recommendation Clinical Impression  Pt. Seen for dysphagia treatment with diagnostic assessment of current swallow function.  Pt. Alert, cooperative  and conversive with family at bedside  Pt. required max verbal/visual/tactile cues to facilitate labial seal around utensil/cup preventing leakage which improved throughout session.  Delayed swallow initiation suspected with possible mild pharyngeal residue evidenced by extra swallows.  No cough, throat clear or wet vocal quality exhibited.  Evidence of possible esophageal impairments as pt. Repositioned to sit up in bed, questionable soft belch and watery eyes (hx GERD).  Recommend pt. Initiate a Dys 1 diet texture with thin liquids, small straw sips allowed, crush pills, allow breaks if needed (reflux) and full supervision and assist.  ST will continue to work with pt. On safety with diet/liquids and upgrade when/ir appropriate.   HPI HPI: 63 yo female adm [facility]25/13 with diarrhea and mental status change.  Pt found to have C Diff/Salmonella colitis/sepsis, Flagyl/Cipro - 2/16 >>>Vanc enemas - 2/17 >>> 2. Renal failure Improving Creatinine - 3.03 - 10/28/2013 Now 1.68, 3. Encephalopathy 4. Recent back surgery - L4/5 decompression - [facility] 1/9/- 09/25/2013.  5. Bipolar/anxiety/depression/sleep disorder with multiple medications. 6. Hypertension. 7. Hx of GERD. 8. Hypothyroid. 9. On chronic disability.  10. DVT prophylaxis - SQ Heparin.  Pt CT head negative for acute changes.  Per sister [name], pt with poor mental status since admit, waxes and wanes from agitation to lethargic.  Bedside swallow evaluation ordered.   Pertinent Vitals No   SLP Plan  Continue with current plan of care    Recommendations Diet recommendations:  Dysphagia 1 (puree);Thin liquid Liquids provided via: Cup;Straw Medication Administration: Crushed with puree Supervision: Patient able to self feed;Full supervision/cueing for compensatory strategies Compensations: Slow rate;Small sips/bites;Check for pocketing Postural Changes and/or Swallow Maneuvers: Seated upright 90 degrees;Upright 30-60 min after meal              Oral Care Recommendations: Oral care Q4 per protocol Follow up Recommendations:  (TBD) Plan: Continue with current plan of care         Houston Siren M.Ed [employer] [phone]  10/31/2013

## 2013-10-31 NOTE — Progress Notes (Signed)
[facility] PROGRESS NOTE  [name] TDV:[id number] DOB: [birth year] DOA: 10/25/2013 PCP: [name], MD Interim summary: 13-yo BF PMHx depression, memory loss, frequent falls, hypertension, hyperlipidemia, depression, GERD, hypothyroidism,with history of lumbar spondylosis status post lumbar decompression surgery in January 2015, recently treated with antibiotic for UTI was brought in by family as patient complained of having loose / watery diarrhea of 3-4 episodes for past few days. Patient also has been noticed to be more confused and sleepy for past 2 days and her son feels that she may be taking some extra dose of oxycodone. Oxycodone was stopped. She was found to have infectious colitis of her ascending colon and caecum possibly from C DIFF and salmonella. Her stool pcr was positive for both. She was started on IV rocephin and IV flagyl along with Vancomycin enemas. 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 Her baseline creatinine is around 1. Her creatinine on admission is 2.94. Today her creatinine is 1.39 Her leukocytosis has improved to  13,000   Assessment/Plan:  Right lower quadrant pain  - secondary to colitis of ascending colon and caecum and c diff colitis. .  - CT abdomen and pelvis shows the above.  - improving.  - on IV antibiotics.   Altered mental status/ acute metabolic encephalopathy probably secondary to acute renal failure and metabolic acidosis:   Patient reportedly more confused for past few days and sleepy. Son feels she was taking more oxycodone than usual. Will hold oxycodone. (She has oxycodone listed as allergy in the form of confusion) . CT head negative for stroke. -As she is on Wellbutrin, Lexapro for depression. She also takes an extremely high dose of Seroquel at bedtime for? Sleep disorder which I will hold for now until her mental status is improved.  - 2/19 pt is more awake and communicative. Sister is at bedside and they are talking.   Spoke to daughter and sister, wants her to go to SNF on discharge.   Hypertension  Blood pressure stable. Holding ARB and HCTZ given acute kidney injury.   GERD  Continue PPI   Anemia  Patient reports passing dark stools for the last few days. Stool for occult blood was negative. Hemoglobin stable   Unspecified hypothyroidism  Continue Synthroid.    Spondylolisthesis of lumbar region  Status post lumbar decompression surgery for L4/L5 lumbar spondylosis and L4/L5 lateral recess stenosis and January 2015 by Dr. Christella Noa.   Salmonella Colitis  - started pt on IV rocephin on 2/18. She was on ciprofloxacin the last 2 days.  - 1 bm in the last 24 hours.   Clostridium difficile  -10/26/2013 C. difficile positive by PCR  -Start antibiotic protocol (patient's WBC> 15 which places him severe category)  -Stool culture pending  - pt on IV flagyl and Vancomycin enemas   Acute on CKD stage 2/Metabolic acidosis. : - improving.  - fena <1.  - probably a combination of intra vascular volume depletion and acute kidney injury.  - currently on IV normal saline 147ml/hr.  - bicarb improving.  - US RENAL negative for hydronephrosis.  - repeat UA and urine cultures pending.  - urine sodium is 11 and creatinine is 445 - DR Florene Glen consulted, recommended colloids and fluids.    Hypernatremia: - free water deficient.  - d extrose fluids ordered.  -    Leukocytosis: improving.  Changed antibiotics to IV rocephin.   Nutrition; pt is more awake today and SLP ordered to restart diet.  Code Status: full code Family Communication: discussed with the daughter over the phone and sister at bedside Disposition Plan: pending PT eval. Social worker consulted.    Consultants:  Surgery for acute abdomen.   Procedures:  CT abdomen and pelvis.   CVL on 2//17  Antibiotics:  IV CIPRO 2/16 TO 2/18  IV ROCEPHIN 2/18  IV FLAGYL 2. 16  VANCOMYCIN PO 2/16.   HPI/Subjective: Not  in distress, not agitated. Confused.   Objective: Filed Vitals:   10/31/13 1309  BP: 143/80  Pulse: 87  Temp:   Resp: 18    Intake/Output Summary (Last 24 hours) at 10/31/13 1919 Last data filed at 10/31/13 1800  Gross per 24 hour  Intake   3035 ml  Output   1251 ml  Net   1784 ml   Filed Weights   10/26/13 0040 10/28/13 0400 10/29/13 0400  Weight: 107 kg (235 lb 14.3 oz) 113 kg (249 lb 1.9 oz) 114.1 kg (251 lb 8.7 oz)    Exam:   General:  ALERT , confused  Cardiovascular: S1S2,   Respiratory: ctab.   Abdomen: soft no tenderness, bs+  Musculoskeletal: no pedal edema.   Data Reviewed: Basic Metabolic Panel:  Recent Labs Lab 10/27/13 0340  10/27/13 1912 10/28/13 0241 10/29/13 0445 10/30/13 0605 10/31/13 0509  NA 134*  < > 138 136* 145 148* 155*  K 4.1  < > 4.5 3.9 3.8 3.6* 3.7  CL 100  < > 104 101 109 115* 122*  CO2 16*  < > 15* 16* 17* 16* 17*  GLUCOSE 125*  < > 116* 107* 88 101* 120*  BUN 60*  < > 63* 64* 62* 49* 39*  CREATININE 2.91*  < > 3.02* 3.03* 2.49* 1.68* 1.38*  CALCIUM 7.6*  < > 7.4* 7.2* 7.6* 8.0* 8.3*  MG 1.8  --   --  1.8 2.0 2.0 1.9  PHOS  --   --   --   --   --  3.3  --   < > = values in this interval not displayed. Liver Function Tests:  Recent Labs Lab 10/27/13 1912 10/28/13 0241 10/29/13 0445 10/30/13 0605 10/31/13 0509  AST 37 42* 34 43* 83*  ALT 19 21 20 23  36*  ALKPHOS 82 77 83 58 74  BILITOT 0.5 0.5 0.3 0.2* 0.2*  PROT 5.0* 4.9* 5.0* 5.1* 5.2*  ALBUMIN 2.0* 1.9* 2.6* 2.9* 2.8*   No results found for this basename: LIPASE, AMYLASE,  in the last 168 hours No results found for this basename: AMMONIA,  in the last 168 hours CBC:  Recent Labs Lab 10/27/13 0340 10/27/13 1410 10/28/13 0241 10/29/13 0445 10/30/13 0605 10/31/13 0509  WBC 39.6* 46.1* 51.9* 33.5* 18.4* 13.1*  NEUTROABS 31.6*  --  43.0* 30.8* 14.9* 10.1*  HGB 12.3 11.7* 10.3* 9.4* 9.4* 10.1*  HCT 35.2* 32.9* 29.8* 27.6* 26.9* 29.8*  MCV 85.4 84.4 85.9  87.1 85.9 87.4  PLT 251 248 PLATELET CLUMPS NOTED ON SMEAR, COUNT APPEARS ADEQUATE 190 203 198   Cardiac Enzymes: No results found for this basename: CKTOTAL, CKMB, CKMBINDEX, TROPONINI,  in the last 168 hours BNP (last 3 results) No results found for this basename: PROBNP,  in the last 8760 hours CBG: No results found for this basename: GLUCAP,  in the last 168 hours  Recent Results (from the past 240 hour(s))  CLOSTRIDIUM DIFFICILE BY PCR     Status: Abnormal   Collection Time    10/26/13  4:18  AM      Result Value Ref Range Status   C difficile by pcr POSITIVE (*) NEGATIVE Final   Comment: CRITICAL RESULT CALLED TO, READ BACK BY AND VERIFIED WITH:     TRACY RN 11:50 10/26/13 ([email or web address])     Performed at [facility]     Status: None   Collection Time    10/26/13  4:18 AM      Result Value Ref Range Status   Specimen Description STOOL   Final   Special Requests NONE   Final   Culture     Final   Value: NO SALMONELLA, SHIGELLA, CAMPYLOBACTER, YERSINIA, OR E.COLI 0157:H7 ISOLATED     Note: REDUCED NORMAL FLORA PRESENT     Performed at [facility]   Report Status 10/30/2013 FINAL   Final  MRSA PCR SCREENING     Status: None   Collection Time    10/27/13  3:56 PM      Result Value Ref Range Status   MRSA by PCR NEGATIVE  NEGATIVE Final   Comment:            The GeneXpert MRSA Assay (FDA     approved for NASAL specimens     only), is one component of a     comprehensive MRSA colonization     surveillance program. It is not     intended to diagnose MRSA     infection nor to guide or     monitor treatment for     MRSA infections.  URINE CULTURE     Status: None   Collection Time    10/28/13 12:00 PM      Result Value Ref Range Status   Specimen Description URINE, CLEAN CATCH   Final   Special Requests NONE   Final   Culture  Setup Time     Final   Value: 10/29/2013 13:59     Performed at [facility]     Final    Value: NO GROWTH     Performed at [facility]   Culture     Final   Value: NO GROWTH     Performed at [facility]   Report Status 10/30/2013 FINAL   Final     Studies: No results found.  Scheduled Meds: . cefTRIAXone (ROCEPHIN)  IV  1 g Intravenous Q24H  . cholecalciferol  1,000 Units Oral Daily  . heparin  5,000 Units Subcutaneous 3 times per day  . lamoTRIgine  200 mg Oral Daily  . levothyroxine  38 mcg Intravenous QAC breakfast  . metoprolol  2.5 mg Intravenous Q12H  . metronidazole  500 mg Intravenous Q8H  . vancomycin (VANCOCIN) rectal ENEMA  500 mg Rectal 4 times per day   Continuous Infusions: . dextrose 75 mL/hr at 10/31/13 1842    Principal Problem:   Acute renal failure Active Problems:   DEPRESSION   HYPERTENSION   Memory loss   ANEMIA   Unspecified hypothyroidism   Spondylolisthesis of lumbar region   Obesity   Right lower quadrant abdominal pain   Altered mental status   Acute kidney injury   UTI (urinary tract infection)    Time spent: 45 MINUTES.     [facility] Pager [phone] If 7PM-7AM, please contact night-coverage at [email or web address], password [facility] 10/31/2013, 7:19 PM  LOS: 6 days

## 2013-11-01 DIAGNOSIS — N390 Urinary tract infection, site not specified: Secondary | ICD-10-CM

## 2013-11-01 LAB — COMPREHENSIVE METABOLIC PANEL
ALBUMIN: 2.8 g/dL — AB (ref 3.5–5.2)
ALK PHOS: 78 U/L (ref 39–117)
ALT: 48 U/L — ABNORMAL HIGH (ref 0–35)
AST: 103 U/L — ABNORMAL HIGH (ref 0–37)
BILIRUBIN TOTAL: 0.3 mg/dL (ref 0.3–1.2)
BUN: 33 mg/dL — AB (ref 6–23)
CHLORIDE: 120 meq/L — AB (ref 96–112)
CO2: 20 meq/L (ref 19–32)
Calcium: 8.6 mg/dL (ref 8.4–10.5)
Creatinine, Ser: 1.28 mg/dL — ABNORMAL HIGH (ref 0.50–1.10)
GFR calc Af Amer: 51 mL/min — ABNORMAL LOW (ref >90)
GFR, EST NON AFRICAN AMERICAN: 44 mL/min — AB (ref >90)
GLUCOSE: 136 mg/dL — AB (ref 70–99)
POTASSIUM: 3.2 meq/L — AB (ref 3.7–5.3)
Sodium: 153 mEq/L — ABNORMAL HIGH (ref 137–147)
Total Protein: 5.2 g/dL — ABNORMAL LOW (ref 6.0–8.3)

## 2013-11-01 LAB — CBC WITH DIFFERENTIAL/PLATELET
BASOS PCT: 1 % (ref 0–1)
Basophils Absolute: 0.1 10*3/uL (ref 0.0–0.1)
EOS PCT: 0 % (ref 0–5)
Eosinophils Absolute: 0 10*3/uL (ref 0.0–0.7)
HEMATOCRIT: 28.7 % — AB (ref 36.0–46.0)
HEMOGLOBIN: 10.1 g/dL — AB (ref 12.0–15.0)
Lymphocytes Relative: 12 % (ref 12–46)
Lymphs Abs: 1.6 10*3/uL (ref 0.7–4.0)
MCH: 29.9 pg (ref 26.0–34.0)
MCHC: 35.2 g/dL (ref 30.0–36.0)
MCV: 84.9 fL (ref 78.0–100.0)
MONO ABS: 1.8 10*3/uL — AB (ref 0.1–1.0)
Monocytes Relative: 14 % — ABNORMAL HIGH (ref 3–12)
NEUTROS ABS: 9.7 10*3/uL — AB (ref 1.7–7.7)
NRBC: 7 /100{WBCs} — AB
Neutrophils Relative %: 73 % (ref 43–77)
Platelets: 206 10*3/uL (ref 150–400)
RBC: 3.38 MIL/uL — AB (ref 3.87–5.11)
RDW: 16.6 % — ABNORMAL HIGH (ref 11.5–15.5)
WBC: 13.2 10*3/uL — ABNORMAL HIGH (ref 4.0–10.5)

## 2013-11-01 LAB — MAGNESIUM: Magnesium: 1.8 mg/dL (ref 1.5–2.5)

## 2013-11-01 MED ORDER — LORAZEPAM 2 MG/ML IJ SOLN
0.5000 mg | Freq: Once | INTRAMUSCULAR | Status: AC
  Administered 2013-11-01: 0.5 mg via INTRAVENOUS

## 2013-11-01 MED ORDER — HALOPERIDOL LACTATE 5 MG/ML IJ SOLN: 2.0000 mg | Freq: Four times a day (QID) | INTRAMUSCULAR | Status: DC | PRN

## 2013-11-01 MED ORDER — QUETIAPINE FUMARATE ER 200 MG PO TB24
200.0000 mg | ORAL_TABLET | Freq: Every day | ORAL | Status: DC
  Administered 2013-11-01 – 2013-11-03 (×3): 200 mg via ORAL
  Filled 2013-11-01 (×4): qty 1

## 2013-11-01 MED ORDER — FREE WATER: 200.0000 mL | Freq: Three times a day (TID) | Status: DC

## 2013-11-01 MED ORDER — LORAZEPAM 2 MG/ML IJ SOLN
INTRAMUSCULAR | Status: AC
  Filled 2013-11-01: qty 1

## 2013-11-01 MED ORDER — POTASSIUM CHLORIDE CRYS ER 20 MEQ PO TBCR
40.0000 meq | EXTENDED_RELEASE_TABLET | Freq: Two times a day (BID) | ORAL | Status: AC
  Administered 2013-11-01 (×2): 40 meq via ORAL
  Filled 2013-11-01 (×3): qty 2

## 2013-11-01 MED ORDER — BUPROPION HCL ER (XL) 150 MG PO TB24
150.0000 mg | ORAL_TABLET | Freq: Every day | ORAL | Status: DC
  Administered 2013-11-01 – 2013-11-04 (×4): 150 mg via ORAL
  Filled 2013-11-01 (×6): qty 1

## 2013-11-01 MED ORDER — FREE WATER
200.0000 mL | Freq: Three times a day (TID) | Status: DC
  Administered 2013-11-02: 200 mL via ORAL

## 2013-11-01 NOTE — Progress Notes (Signed)
Pt becoming increasingly agitated and yelling out that she wants to go home. I tried reassuring pt but she became more anxious. md notified. Orders given.  [name]

## 2013-11-01 NOTE — Progress Notes (Signed)
[facility] PROGRESS NOTE  ALEAN KROMER TDV:[id number] DOB: [birth year] DOA: 10/25/2013 PCP: [name], MD Interim summary: 13-yo BF PMHx depression, memory loss, frequent falls, hypertension, hyperlipidemia, depression, GERD, hypothyroidism,with history of lumbar spondylosis status post lumbar decompression surgery in January 2015, recently treated with antibiotic for UTI was brought in by family as patient complained of having loose / watery diarrhea of 3-4 episodes for past few days. Patient also has been noticed to be more confused and sleepy for past 2 days and her son feels that she may be taking some extra dose of oxycodone. Oxycodone was stopped. She was found to have infectious colitis of her ascending colon and caecum possibly from C DIFF and salmonella. Her stool pcr was positive for both. She was started on IV rocephin and IV flagyl along with Vancomycin enemas. 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 Her baseline creatinine is around 1. Her creatinine on admission is 2.94. Today her creatinine is 1.39 Her leukocytosis has improved to  13,000   Assessment/Plan:  Right lower quadrant pain  - secondary to colitis of ascending colon and caecum and c diff colitis. .  - CT abdomen and pelvis shows the above.  - improving.  - on IV antibiotics.   Altered mental status/ acute metabolic encephalopathy probably secondary to acute renal failure and metabolic acidosis:   Patient reportedly more confused for past few days and sleepy. Son feels she was taking more oxycodone than usual. Will hold oxycodone. (She has oxycodone listed as allergy in the form of confusion) . CT head negative for stroke. -As she is on Wellbutrin, Lexapro for depression. She also takes an extremely high dose of Seroquel at bedtime for? Sleep disorder which I will hold for now until her mental status is improved.  - 2/19 pt is more awake and communicative. Sister is at bedside and they are talking.   Spoke to daughter and sister, wants her to go to SNF on discharge.   Hypertension  Blood pressure stable. Holding ARB and HCTZ given acute kidney injury.   GERD  Continue PPI   Anemia  Patient reports passing dark stools for the last few days. Stool for occult blood was negative. Hemoglobin stable   Unspecified hypothyroidism  Continue Synthroid.    Spondylolisthesis of lumbar region  Status post lumbar decompression surgery for L4/L5 lumbar spondylosis and L4/L5 lateral recess stenosis and January 2015 by Dr. Christella Noa.   Salmonella Colitis  - started pt on IV rocephin on 2/18. She was on ciprofloxacin the last 2 days.  - 1 bm in the last 24 hours.   Clostridium difficile  -10/26/2013 C. difficile positive by PCR  -Start antibiotic protocol (patient's WBC> 15 which places him severe category)  -Stool culture pending  - pt on IV flagyl and Vancomycin enemas   Acute on CKD stage 2/Metabolic acidosis. : - improving.  - fena <1.  - probably a combination of intra vascular volume depletion and acute kidney injury.  - currently on IV normal saline 147ml/hr.  - bicarb improving.  - US RENAL negative for hydronephrosis.  - repeat UA and urine cultures pending.  - urine sodium is 11 and creatinine is 445 - DR Florene Glen consulted, recommended colloids and fluids.    Hypernatremia: - free water deficient.  - d extrose fluids ordered.  -    Leukocytosis: improving.  Changed antibiotics to IV rocephin.   Nutrition; pt is more awake today and SLP ordered to restart diet.  Code Status: full code Family Communication: discussed with the daughter over the phone and sister at bedside Disposition Plan: pending PT eval. Social worker consulted.    Consultants:  Surgery for acute abdomen.   Procedures:  CT abdomen and pelvis.   CVL on 2//17  Antibiotics:  IV CIPRO 2/16 TO 2/18  IV ROCEPHIN 2/18  IV FLAGYL 2. 16  VANCOMYCIN PO 2/16.   HPI/Subjective: Not  in distress, not agitated. Confused.   Objective: Filed Vitals:   11/01/13 0701  BP: 152/83  Pulse: 87  Temp: 96.6 F (35.9 C)  Resp: 16    Intake/Output Summary (Last 24 hours) at 11/01/13 1652 Last data filed at 11/01/13 1000  Gross per 24 hour  Intake 1307.5 ml  Output    501 ml  Net  806.5 ml   Filed Weights   10/26/13 0040 10/28/13 0400 10/29/13 0400  Weight: 107 kg (235 lb 14.3 oz) 113 kg (249 lb 1.9 oz) 114.1 kg (251 lb 8.7 oz)    Exam:   General:  ALERT , confused  Cardiovascular: S1S2,   Respiratory: ctab.   Abdomen: soft no tenderness, bs+  Musculoskeletal: no pedal edema.   Data Reviewed: Basic Metabolic Panel:  Recent Labs Lab 10/28/13 0241 10/29/13 0445 10/30/13 0605 10/31/13 0509 11/01/13 0640  NA 136* 145 148* 155* 153*  K 3.9 3.8 3.6* 3.7 3.2*  CL 101 109 115* 122* 120*  CO2 16* 17* 16* 17* 20  GLUCOSE 107* 88 101* 120* 136*  BUN 64* 62* 49* 39* 33*  CREATININE 3.03* 2.49* 1.68* 1.38* 1.28*  CALCIUM 7.2* 7.6* 8.0* 8.3* 8.6  MG 1.8 2.0 2.0 1.9 1.8  PHOS  --   --  3.3  --   --    Liver Function Tests:  Recent Labs Lab 10/28/13 0241 10/29/13 0445 10/30/13 0605 10/31/13 0509 11/01/13 0640  AST 42* 34 43* 83* 103*  ALT 21 20 23  36* 48*  ALKPHOS 77 83 58 74 78  BILITOT 0.5 0.3 0.2* 0.2* 0.3  PROT 4.9* 5.0* 5.1* 5.2* 5.2*  ALBUMIN 1.9* 2.6* 2.9* 2.8* 2.8*   No results found for this basename: LIPASE, AMYLASE,  in the last 168 hours No results found for this basename: AMMONIA,  in the last 168 hours CBC:  Recent Labs Lab 10/28/13 0241 10/29/13 0445 10/30/13 0605 10/31/13 0509 11/01/13 0640  WBC 51.9* 33.5* 18.4* 13.1* 13.2*  NEUTROABS 43.0* 30.8* 14.9* 10.1* 9.7*  HGB 10.3* 9.4* 9.4* 10.1* 10.1*  HCT 29.8* 27.6* 26.9* 29.8* 28.7*  MCV 85.9 87.1 85.9 87.4 84.9  PLT PLATELET CLUMPS NOTED ON SMEAR, COUNT APPEARS ADEQUATE 190 203 198 206   Cardiac Enzymes: No results found for this basename: CKTOTAL, CKMB, CKMBINDEX,  TROPONINI,  in the last 168 hours BNP (last 3 results) No results found for this basename: PROBNP,  in the last 8760 hours CBG: No results found for this basename: GLUCAP,  in the last 168 hours  Recent Results (from the past 240 hour(s))  CLOSTRIDIUM DIFFICILE BY PCR     Status: Abnormal   Collection Time    10/26/13  4:18 AM      Result Value Ref Range Status   C difficile by pcr POSITIVE (*) NEGATIVE Final   Comment: CRITICAL RESULT CALLED TO, READ BACK BY AND VERIFIED WITH:     TRACY RN 11:50 10/26/13 ([email or web address])     Performed at [facility]     Status: None   Collection  Time    10/26/13  4:18 AM      Result Value Ref Range Status   Specimen Description STOOL   Final   Special Requests NONE   Final   Culture     Final   Value: NO SALMONELLA, SHIGELLA, CAMPYLOBACTER, YERSINIA, OR E.COLI 0157:H7 ISOLATED     Note: REDUCED NORMAL FLORA PRESENT     Performed at [facility]   Report Status 10/30/2013 FINAL   Final  MRSA PCR SCREENING     Status: None   Collection Time    10/27/13  3:56 PM      Result Value Ref Range Status   MRSA by PCR NEGATIVE  NEGATIVE Final   Comment:            The GeneXpert MRSA Assay (FDA     approved for NASAL specimens     only), is one component of a     comprehensive MRSA colonization     surveillance program. It is not     intended to diagnose MRSA     infection nor to guide or     monitor treatment for     MRSA infections.  URINE CULTURE     Status: None   Collection Time    10/28/13 12:00 PM      Result Value Ref Range Status   Specimen Description URINE, CLEAN CATCH   Final   Special Requests NONE   Final   Culture  Setup Time     Final   Value: 10/29/2013 13:59     Performed at [place]     Final   Value: NO GROWTH     Performed at [facility]   Culture     Final   Value: NO GROWTH     Performed at [facility]   Report Status 10/30/2013 FINAL   Final      Studies: No results found.  Scheduled Meds: . buPROPion  150 mg Oral Daily  . cefTRIAXone (ROCEPHIN)  IV  1 g Intravenous Q24H  . cholecalciferol  1,000 Units Oral Daily  . free water  200 mL Oral 3 times per day  . heparin  5,000 Units Subcutaneous 3 times per day  . lamoTRIgine  200 mg Oral Daily  . levothyroxine  38 mcg Intravenous QAC breakfast  . metoprolol  2.5 mg Intravenous Q12H  . metronidazole  500 mg Intravenous Q8H  . potassium chloride  40 mEq Oral BID  . QUEtiapine  200 mg Oral QHS  . vancomycin (VANCOCIN) rectal ENEMA  500 mg Rectal 4 times per day   Continuous Infusions: . dextrose 75 mL/hr at 11/01/13 1446    Principal Problem:   Acute renal failure Active Problems:   DEPRESSION   HYPERTENSION   Memory loss   ANEMIA   Unspecified hypothyroidism   Spondylolisthesis of lumbar region   Obesity   Right lower quadrant abdominal pain   Altered mental status   Acute kidney injury   UTI (urinary tract infection)    Time spent: 35 MINUTES.     [facility] Pager [phone] If 7PM-7AM, please contact night-coverage at [email or web address], password [facility] 11/01/2013, 4:52 PM  LOS: 7 days

## 2013-11-02 ENCOUNTER — Ambulatory Visit: Payer: Medicare Other | Admitting: Internal Medicine

## 2013-11-02 DIAGNOSIS — Z0289 Encounter for other administrative examinations: Secondary | ICD-10-CM

## 2013-11-02 LAB — COMPREHENSIVE METABOLIC PANEL
ALT: 45 U/L — ABNORMAL HIGH (ref 0–35)
AST: 81 U/L — ABNORMAL HIGH (ref 0–37)
Albumin: 2.7 g/dL — ABNORMAL LOW (ref 3.5–5.2)
Alkaline Phosphatase: 75 U/L (ref 39–117)
BUN: 30 mg/dL — AB (ref 6–23)
CO2: 21 mEq/L (ref 19–32)
Calcium: 8.3 mg/dL — ABNORMAL LOW (ref 8.4–10.5)
Chloride: 118 mEq/L — ABNORMAL HIGH (ref 96–112)
Creatinine, Ser: 1.19 mg/dL — ABNORMAL HIGH (ref 0.50–1.10)
GFR calc Af Amer: 56 mL/min — ABNORMAL LOW (ref >90)
GFR calc non Af Amer: 48 mL/min — ABNORMAL LOW (ref >90)
Glucose, Bld: 126 mg/dL — ABNORMAL HIGH (ref 70–99)
POTASSIUM: 3.3 meq/L — AB (ref 3.7–5.3)
Sodium: 150 mEq/L — ABNORMAL HIGH (ref 137–147)
Total Bilirubin: <0.2 mg/dL — ABNORMAL LOW (ref 0.3–1.2)
Total Protein: 4.9 g/dL — ABNORMAL LOW (ref 6.0–8.3)

## 2013-11-02 LAB — CBC WITH DIFFERENTIAL/PLATELET
BASOS ABS: 0 10*3/uL (ref 0.0–0.1)
Basophils Relative: 0 % (ref 0–1)
Eosinophils Absolute: 0 10*3/uL (ref 0.0–0.7)
Eosinophils Relative: 0 % (ref 0–5)
HCT: 28.2 % — ABNORMAL LOW (ref 36.0–46.0)
Hemoglobin: 9.6 g/dL — ABNORMAL LOW (ref 12.0–15.0)
LYMPHS ABS: 2 10*3/uL (ref 0.7–4.0)
Lymphocytes Relative: 13 % (ref 12–46)
MCH: 29.4 pg (ref 26.0–34.0)
MCHC: 34 g/dL (ref 30.0–36.0)
MCV: 86.5 fL (ref 78.0–100.0)
MONOS PCT: 10 % (ref 3–12)
Monocytes Absolute: 1.5 10*3/uL — ABNORMAL HIGH (ref 0.1–1.0)
NEUTROS PCT: 77 % (ref 43–77)
Neutro Abs: 11.9 10*3/uL — ABNORMAL HIGH (ref 1.7–7.7)
PLATELETS: 205 10*3/uL (ref 150–400)
RBC: 3.26 MIL/uL — ABNORMAL LOW (ref 3.87–5.11)
RDW: 16.8 % — AB (ref 11.5–15.5)
WBC: 15.4 10*3/uL — AB (ref 4.0–10.5)

## 2013-11-02 MED ORDER — VANCOMYCIN 50 MG/ML ORAL SOLUTION
125.0000 mg | Freq: Four times a day (QID) | ORAL | Status: DC
  Administered 2013-11-02 – 2013-11-04 (×8): 125 mg via ORAL
  Filled 2013-11-02 (×13): qty 2.5

## 2013-11-02 MED ORDER — ENSURE COMPLETE PO LIQD: 237.0000 mL | Freq: Two times a day (BID) | ORAL | Status: DC

## 2013-11-02 NOTE — Progress Notes (Signed)
CSW assisting with d/c planning. Pt / family have chosen [facility] for SNF placement. Bed should be available Tuesday if pt is ready for d/c. CSW will continue following to assist with d/c planning.  Werner Lean LCSW [phone]

## 2013-11-02 NOTE — Telephone Encounter (Signed)
Noted.  Until seen, WP will not be signing orders.  Pt currently in the hospital.  See other notes.

## 2013-11-02 NOTE — Progress Notes (Signed)
ANTIBIOTIC CONSULT NOTE - FOLLOW UP  Pharmacy Consult for PO vancomycin, Ceftriaxone Indication: severe C.diff, Salmonella colitis  Allergies  Allergen Reactions  . Aspirin     REACTION: rash   Vital Signs: Temp: 97.7 F (36.5 C) (02/23 0644) Temp src: Oral (02/23 0644) BP: 128/81 mmHg (02/23 0900) Pulse Rate: 74 (02/23 0900) Intake/Output from previous day: 02/22 0701 - 02/23 0700 In: 2770 [P.O.:320; I.V.:2100; IV Piggyback:350] Out: 502 [Urine:501; Stool:1]  Labs:  Recent Labs  10/31/13 0509 11/01/13 0640 11/02/13 0445  WBC 13.1* 13.2* 15.4*  HGB 10.1* 10.1* 9.6*  PLT 198 206 205  CREATININE 1.38* 1.28* 1.19*   Estimated Creatinine Clearance: 62.8 ml/min (by C-G formula based on Cr of 1.19).    Assessment: 34 yoF on D#6 Ceftriaxone (s/p 3 days Cipro also) for infectious colitis w/ Salmonella, D9 Flagyl 500mg  IV q8h and, D8 vanc now as ENEMA for severe complicated C.diff colitis.   2/16 >> Cipro >> 2/17 2/16 >> Flagyl >> 2/16 >> vanc PO >> dose increased 2/17, changed to enemas 2/18 >> Ceftriaxone >>   Tmax: afebrile WBCs: remains elevated, 15.4 Renal: Scr improved to 1.19, CrCl 63 mL/min  2/16 C.Diff >> positive 2/16 stool cx >> "reduced normal flora present; NO SALMONELLA, SHIGELLA, CAMPYLOBACTER, YERSINIA, OR E.COLI 0157:H7 ISOLATED" 2/16 GI pathogen PCR: (+) C.diff toxin and salmonella 2/18 Urine cx >> NG Final   Plan:   Continue Rocephin 1g IV q24h  Continue Flagyl 500mg  IV q8h  Continue Vancomycin enema 500mg  q6h  When pt able to tolerate PO medications, suggest considering changing vancomycin from enema to PO and discontinuing Flagyl.  Clayburn Pert, PharmD, BCPS Pager: [phone] 11/02/2013  1:10 PM

## 2013-11-02 NOTE — Progress Notes (Signed)
NUTRITION FOLLOW UP  Intervention:   - Ensure Complete BID - Encouraged increased meal intake - Will continue to monitor   Nutrition Dx:   Inadequate oral intake related to n/v/loose stools as evidenced by PO intake < 75% - ongoing   Goal:   Pt to meet >/= 90% of their estimated nutrition needs - not met  Monitor:   Weights, labs, intake, nausea  Assessment:   63 year old obese female with history of lumbar spondylosis status post lumbar decompression surgery in January 2015, hypertension, hyperlipidemia, depression, GERD, hypothyroidism, recently treated with antibiotic for UTI was brought in by family as patient complained of having loose / watery diarrhea of 3-4 episodes for past few days. Patient is confused during conversation she was primarily provided by her son at bedside. Patient was discharged to skilled nursing facility about 10 days back. For past 2 days she has been complaining of stools and right lower quadrant pain radiating to the back. Patient also has been noticed to be more confused and sleepy for past 2 days and her son feels that she may be taking some extra dose of oxycodone. Patient also informs some nausea and vomiting but is unable to explain further   2/16: -Pt reported unable to tolerate any PO intake d/t nausea and vomiting for past few days. With RLQ ad pain per MD  -Pt's son assists in her care, and has been attempting to encourage pt to eat.  -Pt believes some weight loss has occurred but is unable to quantify amount. Weight has decreased 23 lbs in 5 weeks per previous medical records. Was willing to try supplements if appetite continues to be <75% of needs  -Positive for c.diff, which is likely contributing to poor PO intake and decreased appetite  -Had not eaten breakfast; was eager to try clear liquids for lunch; however, continues to have nausea and was spitting up after only several bites of foods  -Prerenal d/t dehydration per MD  2/23: -Reviewed  events since last RD visit. -2/17: Pt was noted to be very lethargic with significant change compared to the day prior was transferred to the ICU. Transferred to [facility] 2/19.  -Nephrology signed off as pt in recovery phase of acute kidney injury and pt with improved urine output.  -2/20: SLP bedside swallow evaluation and was noted to have severe aspiration risk with recommendations for NPO.  -2/21: SLP treatment dysphagia note recommended dysphagia 1/thin liquid which was ordered. -PO intake documented since diet advanced has ranged from 10-80% meal completion.  -Met with pt today who reports not eating anything for breakfast and had not touched any of her lunch tray. C/o smell of food makes her not want to eat it. RN reports pt had some nausea this morning and also reports pt's confusion improving.   Sodium elevated Potassium low, getting oral replacement BUN/Cr elevated but trending down and GFR low but improving AST/ALT elevated but trending down   Height: Ht Readings from Last 1 Encounters:  10/26/13 _0  (1.676 m)    Weight Status:   Wt Readings from Last 1 Encounters:  10/29/13 251 lb 8.7 oz (114.1 kg)    Re-estimated needs:  Kcal: 1700-1900  Protein: 110-120 lbs  Fluid: >/= 2000 ml/daily  Skin: Non-pitting RLE, LLE edema  Diet Order: Dysphagia 1, thin    Intake/Output Summary (Last 24 hours) at 11/02/13 1254 Last data filed at 11/02/13 1001  Gross per 24 hour  Intake 2461.25 ml  Output    501  ml  Net 1960.25 ml    Last BM: 2/23   Labs:   Recent Labs Lab 10/29/13 0445 10/30/13 0605 10/31/13 0509 11/01/13 0640 11/02/13 0445  NA 145 148* 155* 153* 150*  K 3.8 3.6* 3.7 3.2* 3.3*  CL 109 115* 122* 120* 118*  CO2 17* 16* 17* 20 21  BUN 62* 49* 39* 33* 30*  CREATININE 2.49* 1.68* 1.38* 1.28* 1.19*  CALCIUM 7.6* 8.0* 8.3* 8.6 8.3*  MG 2.0 2.0 1.9 1.8  --   PHOS  --  3.3  --   --   --   GLUCOSE 88 101* 120* 136* 126*    CBG (last 3)  No results  found for this basename: GLUCAP,  in the last 72 hours  Scheduled Meds: . buPROPion  150 mg Oral Daily  . cefTRIAXone (ROCEPHIN)  IV  1 g Intravenous Q24H  . cholecalciferol  1,000 Units Oral Daily  . free water  200 mL Oral 3 times per day  . heparin  5,000 Units Subcutaneous 3 times per day  . lamoTRIgine  200 mg Oral Daily  . levothyroxine  38 mcg Intravenous QAC breakfast  . metoprolol  2.5 mg Intravenous Q12H  . metronidazole  500 mg Intravenous Q8H  . QUEtiapine  200 mg Oral QHS  . vancomycin (VANCOCIN) rectal ENEMA  500 mg Rectal 4 times per day    Continuous Infusions: . dextrose 75 mL/hr at 11/02/13 0126    Mikey College MS, [place], [place] Pager [phone] After Hours Pager

## 2013-11-02 NOTE — Progress Notes (Signed)
Resumed care of patient.  No change in patient assessment.  Will continue to  Monitor.

## 2013-11-02 NOTE — Progress Notes (Signed)
Physical Therapy Treatment Patient Details Name: [name] MRN: [id number] DOB: [birth year] Today's Date: 11/02/2013 Time: 0347-4259 PT Time Calculation (min): 28 min  PT Assessment / Plan / Recommendation  History of Present Illness 62-yo BF PMHx depression, memory loss, frequent falls, hypertension, hyperlipidemia, depression, GERD, hypothyroidism,with history of lumbar spondylosis status post lumbar decompression surgery in January 2015, recently treated with antibiotic for UTI was brought in by family as patient complained of having loose / watery diarrhea of 3-4 episodes for past few days. Patient also has been noticed to be more confused and sleepy for past 2 days and her son feels that she may be taking some extra dose of oxycodone. Oxycodone was stopped. She was found to have infectious colitis of her ascending colon and caecum possibly from C DIFF and salmonella. Her stool pcr was positive for both. She was started on IV rocephin and IV flagyl along with Vancomycin enemas. 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 Her baseline creatinine is around 1. Her creatinine on admission is 2.94.   PT Comments   Pt progressing, requires encouragement  Follow Up Recommendations  SNF     Does the patient have the potential to tolerate intense rehabilitation     Barriers to Discharge        Equipment Recommendations  None recommended by PT    Recommendations for Other Services    Frequency Min 3X/week   Progress towards PT Goals Progress towards PT goals: Progressing toward goals  Plan Current plan remains appropriate    Precautions / Restrictions Precautions Precautions: Fall;Back Precaution Comments: incontinent of stool/CDiff; pt able to verbalize 2/3 back precautions   Pertinent Vitals/Pain Pt c/o pain, nonspecific   Mobility  Bed Mobility Overal bed mobility: Needs Assistance;+2 for physical assistance Bed Mobility: Sidelying to Sit;Rolling;Sit to Sidelying Rolling: Mod  assist Sidelying to sit: Mod assist Sit to sidelying: Mod assist General bed mobility comments: verbal cues for self assist and back precautions Transfers Overall transfer level: Needs assistance Equipment used: Rolling walker (2 wheeled) Transfers: Sit to/from Stand Sit to Stand: Min assist;From elevated surface General transfer comment: verbal cues for hand placement Ambulation/Gait Ambulation/Gait assistance:  (lateral steps with RW and min/mod)    Exercises     PT Diagnosis:    PT Problem List:   PT Treatment Interventions:     PT Goals (current goals can now be found in the care plan section) Acute Rehab PT Goals Patient Stated Goal: none stated Time For Goal Achievement: 11/05/13 Potential to Achieve Goals: Fair  Visit Information  Last PT Received On: 11/02/13 Assistance Needed: +2 History of Present Illness: 62-yo BF PMHx depression, memory loss, frequent falls, hypertension, hyperlipidemia, depression, GERD, hypothyroidism,with history of lumbar spondylosis status post lumbar decompression surgery in January 2015, recently treated with antibiotic for UTI was brought in by family as patient complained of having loose / watery diarrhea of 3-4 episodes for past few days. Patient also has been noticed to be more confused and sleepy for past 2 days and her son feels that she may be taking some extra dose of oxycodone. Oxycodone was stopped. She was found to have infectious colitis of her ascending colon and caecum possibly from C DIFF and salmonella. Her stool pcr was positive for both. She was started on IV rocephin and IV flagyl along with Vancomycin enemas. 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 Her baseline creatinine is around 1. Her creatinine on admission is 2.94.    Subjective Data  Patient  Stated Goal: none stated   Cognition  Cognition Arousal/Alertness: Awake/alert Behavior During Therapy: WFL for tasks assessed/performed Overall Cognitive Status: No  family/caregiver present to determine baseline cognitive functioning Following Commands: Follows one step commands with increased time    Balance  Balance Overall balance assessment: Needs assistance Sitting-balance support: Bilateral upper extremity supported;Feet supported Sitting balance-Leahy Scale: Fair Sitting balance - Comments: able to sit unassisted but unable to tol challenges Standing balance support: Bilateral upper extremity supported;During functional activity Standing balance-Leahy Scale: Poor  End of Session PT - End of Session Equipment Utilized During Treatment: Gait belt Activity Tolerance: Patient tolerated treatment well Patient left: in bed;with call bell/phone within reach Nurse Communication: Mobility status   GP     [facility] 11/02/2013, 4:26 PM

## 2013-11-02 NOTE — Telephone Encounter (Signed)
She stated the information was for the doctor to be aware of

## 2013-11-02 NOTE — Progress Notes (Addendum)
Speech Language Pathology Treatment: Dysphagia  Patient Details Name: [name] MRN: [id number] DOB: 07-Aug-1951 Today's Date: 11/02/2013 Time: 7319-2438 SLP Time Calculation (min): 14 min  Assessment / Plan / Recommendation Clinical Impression  Much improved mentation compared to Friday's SLP visit resulting in much improved swallow and expressive communication ability.  Pt reports issues with food odors making her nauseous but denies dysphagia.  She was able to adequately masticate graham cracker and consume tea without s/s of aspiration nor significant oral stasis.  Pt reports scent of Ensure made her feel sick as well.    Recommend to advance diet to regular/thin with general aspiration precautions as transient oral dysphagia has resolved- presumed due to decreased encephalopathy.  Educated pt to recommendations and precautions- no further SLP indicated.      HPI HPI: 63 yo female adm to [facility]/25/13 with diarrhea and mental status change.  Pt found to have C Diff/Salmonella colitis/sepsis, Flagyl/Cipro - 2/16 >>>Vanc enemas - 2/17 >>> 2. Renal failure Improving Creatinine - 3.03 - 10/28/2013 Now 1.68, 3. Encephalopathy 4. Recent back surgery - L4/5 decompression - [facility] [place] [facility] 1/9/- 09/25/2013.  5. Bipolar/anxiety/depression/sleep disorder with multiple medications. 6. Hypertension. 7. Hx of GERD. 8. Hypothyroid. 9. On chronic disability.  10. DVT prophylaxis - SQ Heparin.  Pt CT head negative for acute changes.  Per sister [name], pt with poor mental status since admit, waxes and wanes from agitation to lethargic.  Bedside swallow evaluation completed on Friday with clinical swallow treatment on Saturday indicating readiness for dietary advancement.  Per review of RD note and pt confirmation,  pt reports smell of food makes her feel naused.    Pertinent Vitals Afebrile, decreased  SLP Plan  All goals met    Recommendations Diet recommendations: Regular;Thin liquid Liquids  provided via: Cup;Straw Medication Administration: Whole meds with liquid Supervision: Patient able to self feed;Intermittent supervision to cue for compensatory strategies Compensations: Slow rate;Small sips/bites Postural Changes and/or Swallow Maneuvers: Seated upright 90 degrees;Upright 30-60 min after meal              Oral Care Recommendations: Oral care BID Plan: All goals met    [place], [place] [facility] SLP [phone]

## 2013-11-02 NOTE — Progress Notes (Signed)
[facility] PROGRESS NOTE  [name]:[id number] DOB: 08-Oct-1950 DOA: 10/25/2013 PCP: [name], MD Interim summary: 40-yo BF PMHx depression, memory loss, frequent falls, hypertension, hyperlipidemia, depression, GERD, hypothyroidism,with history of lumbar spondylosis status post lumbar decompression surgery in January 2015, recently treated with antibiotic for UTI was brought in by family as patient complained of having loose / watery diarrhea of 3-4 episodes for past few days. Patient also has been noticed to be more confused and sleepy for past 2 days and her son feels that she may be taking some extra dose of oxycodone. Oxycodone was stopped. She was found to have infectious colitis of her ascending colon and caecum possibly from C DIFF and salmonella. Her stool pcr was positive for both. She was started on IV rocephin and IV flagyl along with Vancomycin enemas. 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 Her baseline creatinine is around 1. Her creatinine on admission is 2.94. Today her creatinine is 1.39 Her leukocytosis has improved to  15,000   Assessment/Plan:  Right lower quadrant pain  - secondary to colitis of ascending colon and caecum and c diff colitis. .  - CT abdomen and pelvis shows the above.  - improving.  - on IV antibiotics.   Altered mental status/ acute metabolic encephalopathy probably secondary to acute renal failure and metabolic acidosis:   Resolved. With fluids.  Spoke to daughter and sister, wants her to go to SNF on discharge.   Hypertension  Blood pressure stable. Holding ARB and HCTZ given acute kidney injury.   GERD  Continue PPI   Anemia  Patient reports passing dark stools for the last few days. Stool for occult blood was negative. Hemoglobin stable   Unspecified hypothyroidism  Continue Synthroid.    Spondylolisthesis of lumbar region  Status post lumbar decompression surgery for L4/L5 lumbar spondylosis and L4/L5 lateral  recess stenosis and January 2015 by Dr. Christella Noa.   Salmonella Colitis  - started pt on IV rocephin on 2/18. She was on ciprofloxacin the last 2 days.  - 1 bm in the last 24 hours.   Clostridium difficile  -10/26/2013 C. difficile positive by PCR  -Start antibiotic protocol (patient's WBC> 15 which places him severe category)  -Stool culture pending  - pt on IV flagyl and vanco enemas changed to po vancomycin.  Acute on CKD stage 2/Metabolic acidosis. : - improving.  - fena <1.  - probably a combination of intra vascular volume depletion and acute kidney injury.  - currently on IV normal saline 165ml/hr.  - bicarb improving.  - US RENAL negative for hydronephrosis.  - repeat UA and urine cultures pending.  - urine sodium is 11 and creatinine is 445 - DR Florene Glen consulted, recommended colloids and fluids.    Hypernatremia: - free water deficient.  - d extrose fluids ordered.  - improved.    Leukocytosis: improving.  Changed antibiotics to IV rocephin.   Nutrition; pt is more awake today and SLP ordered to restart diet.       Code Status: full code Family Communication: discussed with the daughter over the phone and sister at bedside Disposition Plan: pending PT eval. Social worker consulted.    Consultants:  Surgery for acute abdomen.   Procedures:  CT abdomen and pelvis.   CVL on 2//17  Antibiotics:  IV CIPRO 2/16 TO 2/18  IV ROCEPHIN 2/18  IV FLAGYL 2. 16  VANCOMYCIN PO 2/16.   HPI/Subjective: Not in distress, not agitated lucid.   Objective: Filed  Vitals:   11/02/13 1336  BP: 126/70  Pulse: 72  Temp: 97.7 F (36.5 C)  Resp: 20    Intake/Output Summary (Last 24 hours) at 11/02/13 1842 Last data filed at 11/02/13 1800  Gross per 24 hour  Intake 1681.25 ml  Output      0 ml  Net 1681.25 ml   Filed Weights   10/26/13 0040 10/28/13 0400 10/29/13 0400  Weight: 107 kg (235 lb 14.3 oz) 113 kg (249 lb 1.9 oz) 114.1 kg (251 lb 8.7 oz)     Exam:   General:  ALERT , afebrile comfortable.   Cardiovascular: S1S2,   Respiratory: ctab.   Abdomen: soft no tenderness, bs+  Musculoskeletal: no pedal edema.   Data Reviewed: Basic Metabolic Panel:  Recent Labs Lab 10/28/13 0241 10/29/13 0445 10/30/13 0605 10/31/13 0509 11/01/13 0640 11/02/13 0445  NA 136* 145 148* 155* 153* 150*  K 3.9 3.8 3.6* 3.7 3.2* 3.3*  CL 101 109 115* 122* 120* 118*  CO2 16* 17* 16* 17* 20 21  GLUCOSE 107* 88 101* 120* 136* 126*  BUN 64* 62* 49* 39* 33* 30*  CREATININE 3.03* 2.49* 1.68* 1.38* 1.28* 1.19*  CALCIUM 7.2* 7.6* 8.0* 8.3* 8.6 8.3*  MG 1.8 2.0 2.0 1.9 1.8  --   PHOS  --   --  3.3  --   --   --    Liver Function Tests:  Recent Labs Lab 10/29/13 0445 10/30/13 0605 10/31/13 0509 11/01/13 0640 11/02/13 0445  AST 34 43* 83* 103* 81*  ALT 20 23 36* 48* 45*  ALKPHOS 83 58 74 78 75  BILITOT 0.3 0.2* 0.2* 0.3 <0.2*  PROT 5.0* 5.1* 5.2* 5.2* 4.9*  ALBUMIN 2.6* 2.9* 2.8* 2.8* 2.7*   No results found for this basename: LIPASE, AMYLASE,  in the last 168 hours No results found for this basename: AMMONIA,  in the last 168 hours CBC:  Recent Labs Lab 10/29/13 0445 10/30/13 0605 10/31/13 0509 11/01/13 0640 11/02/13 0445  WBC 33.5* 18.4* 13.1* 13.2* 15.4*  NEUTROABS 30.8* 14.9* 10.1* 9.7* 11.9*  HGB 9.4* 9.4* 10.1* 10.1* 9.6*  HCT 27.6* 26.9* 29.8* 28.7* 28.2*  MCV 87.1 85.9 87.4 84.9 86.5  PLT 190 203 198 206 205   Cardiac Enzymes: No results found for this basename: CKTOTAL, CKMB, CKMBINDEX, TROPONINI,  in the last 168 hours BNP (last 3 results) No results found for this basename: PROBNP,  in the last 8760 hours CBG: No results found for this basename: GLUCAP,  in the last 168 hours  Recent Results (from the past 240 hour(s))  CLOSTRIDIUM DIFFICILE BY PCR     Status: Abnormal   Collection Time    10/26/13  4:18 AM      Result Value Ref Range Status   C difficile by pcr POSITIVE (*) NEGATIVE Final   Comment:  CRITICAL RESULT CALLED TO, READ BACK BY AND VERIFIED WITH:     TRACY RN 11:50 10/26/13 ([email or web address])     Performed at [place]     Status: None   Collection Time    10/26/13  4:18 AM      Result Value Ref Range Status   Specimen Description STOOL   Final   Special Requests NONE   Final   Culture     Final   Value: NO SALMONELLA, SHIGELLA, CAMPYLOBACTER, YERSINIA, OR E.COLI 0157:H7 ISOLATED     Note: REDUCED NORMAL FLORA PRESENT     Performed at  [facility]   Report Status 10/30/2013 FINAL   Final  MRSA PCR SCREENING     Status: None   Collection Time    10/27/13  3:56 PM      Result Value Ref Range Status   MRSA by PCR NEGATIVE  NEGATIVE Final   Comment:            The GeneXpert MRSA Assay (FDA     approved for NASAL specimens     only), is one component of a     comprehensive MRSA colonization     surveillance program. It is not     intended to diagnose MRSA     infection nor to guide or     monitor treatment for     MRSA infections.  URINE CULTURE     Status: None   Collection Time    10/28/13 12:00 PM      Result Value Ref Range Status   Specimen Description URINE, CLEAN CATCH   Final   Special Requests NONE   Final   Culture  Setup Time     Final   Value: 10/29/2013 13:59     Performed at [place]     Final   Value: NO GROWTH     Performed at [facility]   Culture     Final   Value: NO GROWTH     Performed at [facility]   Report Status 10/30/2013 FINAL   Final     Studies: No results found.  Scheduled Meds: . buPROPion  150 mg Oral Daily  . cefTRIAXone (ROCEPHIN)  IV  1 g Intravenous Q24H  . cholecalciferol  1,000 Units Oral Daily  . feeding supplement (ENSURE COMPLETE)  237 mL Oral BID BM  . free water  200 mL Oral 3 times per day  . heparin  5,000 Units Subcutaneous 3 times per day  . lamoTRIgine  200 mg Oral Daily  . levothyroxine  38 mcg Intravenous QAC breakfast  .  metoprolol  2.5 mg Intravenous Q12H  . metronidazole  500 mg Intravenous Q8H  . QUEtiapine  200 mg Oral QHS  . vancomycin  125 mg Oral QID   Continuous Infusions: . dextrose 75 mL/hr at 11/02/13 1628    Principal Problem:   Acute renal failure Active Problems:   DEPRESSION   HYPERTENSION   Memory loss   ANEMIA   Unspecified hypothyroidism   Spondylolisthesis of lumbar region   Obesity   Right lower quadrant abdominal pain   Altered mental status   Acute kidney injury   UTI (urinary tract infection)    Time spent: 35 MINUTES.     [facility] Pager [phone] If 7PM-7AM, please contact night-coverage at [email or web address], password [facility] 11/02/2013, 6:42 PM  LOS: 8 days

## 2013-11-03 LAB — BASIC METABOLIC PANEL
BUN: 24 mg/dL — ABNORMAL HIGH (ref 6–23)
BUN: 21 mg/dL (ref 6–23)
CHLORIDE: 109 meq/L (ref 96–112)
CO2: 20 meq/L (ref 19–32)
CO2: 21 mEq/L (ref 19–32)
Calcium: 7.8 mg/dL — ABNORMAL LOW (ref 8.4–10.5)
Calcium: 7.6 mg/dL — ABNORMAL LOW (ref 8.4–10.5)
Chloride: 111 mEq/L (ref 96–112)
Creatinine, Ser: 1.02 mg/dL (ref 0.50–1.10)
Creatinine, Ser: 0.94 mg/dL (ref 0.50–1.10)
GFR calc Af Amer: 67 mL/min — ABNORMAL LOW (ref >90)
GFR calc Af Amer: 74 mL/min — ABNORMAL LOW (ref >90)
GFR calc non Af Amer: 58 mL/min — ABNORMAL LOW (ref >90)
GFR, EST NON AFRICAN AMERICAN: 64 mL/min — AB (ref >90)
GLUCOSE: 122 mg/dL — AB (ref 70–99)
Glucose, Bld: 119 mg/dL — ABNORMAL HIGH (ref 70–99)
POTASSIUM: 3.2 meq/L — AB (ref 3.7–5.3)
Potassium: 3 mEq/L — ABNORMAL LOW (ref 3.7–5.3)
SODIUM: 142 meq/L (ref 137–147)
Sodium: 141 mEq/L (ref 137–147)

## 2013-11-03 LAB — PHENCYCLIDINE (PCP), URINE, CONFIRMATION: PCP Quant, Ur: NEGATIVE ng/mL

## 2013-11-03 LAB — CBC WITH DIFFERENTIAL/PLATELET
Basophils Absolute: 0 10*3/uL (ref 0.0–0.1)
Basophils Relative: 0 % (ref 0–1)
EOS PCT: 0 % (ref 0–5)
Eosinophils Absolute: 0.1 10*3/uL (ref 0.0–0.7)
HCT: 27.8 % — ABNORMAL LOW (ref 36.0–46.0)
HEMOGLOBIN: 9.6 g/dL — AB (ref 12.0–15.0)
Lymphocytes Relative: 11 % — ABNORMAL LOW (ref 12–46)
Lymphs Abs: 1.6 10*3/uL (ref 0.7–4.0)
MCH: 29.4 pg (ref 26.0–34.0)
MCHC: 34.5 g/dL (ref 30.0–36.0)
MCV: 85 fL (ref 78.0–100.0)
Monocytes Absolute: 1.5 10*3/uL — ABNORMAL HIGH (ref 0.1–1.0)
Monocytes Relative: 10 % (ref 3–12)
Neutro Abs: 11.7 10*3/uL — ABNORMAL HIGH (ref 1.7–7.7)
Neutrophils Relative %: 79 % — ABNORMAL HIGH (ref 43–77)
Platelets: 204 10*3/uL (ref 150–400)
RBC: 3.27 MIL/uL — AB (ref 3.87–5.11)
RDW: 16.9 % — ABNORMAL HIGH (ref 11.5–15.5)
WBC: 14.8 10*3/uL — AB (ref 4.0–10.5)

## 2013-11-03 MED ORDER — POTASSIUM CHLORIDE 10 MEQ/100ML IV SOLN
10.0000 meq | INTRAVENOUS | Status: AC
  Administered 2013-11-03 (×2): 10 meq via INTRAVENOUS
  Filled 2013-11-03 (×2): qty 100

## 2013-11-03 MED ORDER — PROMETHAZINE HCL 25 MG/ML IJ SOLN
12.5000 mg | Freq: Four times a day (QID) | INTRAMUSCULAR | Status: DC | PRN
  Administered 2013-11-03 – 2013-11-04 (×3): 12.5 mg via INTRAVENOUS
  Filled 2013-11-03 (×3): qty 1

## 2013-11-03 MED ORDER — LEVOTHYROXINE SODIUM 75 MCG PO TABS
75.0000 ug | ORAL_TABLET | Freq: Every day | ORAL | Status: DC
  Administered 2013-11-04: 75 ug via ORAL
  Filled 2013-11-03 (×2): qty 1

## 2013-11-03 NOTE — Progress Notes (Signed)
[facility] PROGRESS NOTE  [name] [id number] DOB: 1951/01/14 DOA: 10/25/2013 PCP: [name], MD Interim summary: 47-yo BF PMHx depression, memory loss, frequent falls, hypertension, hyperlipidemia, depression, GERD, hypothyroidism,with history of lumbar spondylosis status post lumbar decompression surgery in January 2015, recently treated with antibiotic for UTI was brought in by family as patient complained of having loose / watery diarrhea of 3-4 episodes for past few days. Patient also has been noticed to be more confused and sleepy for past 2 days and her son feels that she may be taking some extra dose of oxycodone. Oxycodone was stopped. She was found to have infectious colitis of her ascending colon and caecum possibly from C DIFF and salmonella. Her stool pcr was positive for both. She was started on IV rocephin and IV flagyl along with Vancomycin enemas. 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 Her baseline creatinine is around 1. Her creatinine on admission is 2.94. Today her creatinine is 0.94 Her leukocytosis has improved to  15,000.  I WOULD have discharged her today ,but she had 11 loose watery bowel movements yesterday . We are going to watch her today.    Assessment/Plan:  Right lower quadrant pain  - secondary to colitis of ascending colon and caecum and c diff colitis. .  - CT abdomen and pelvis shows the above.  - improving.  - on IV antibiotics.   Altered mental status/ acute metabolic encephalopathy probably secondary to acute renal failure and metabolic acidosis:   Resolved. With fluids.  Spoke to daughter and sister, wants her to go to SNF on discharge.   Hypertension  Blood pressure stable. Holding ARB and HCTZ given acute kidney injury.   GERD  Continue PPI   Anemia  Patient reports passing dark stools for the last few days. Stool for occult blood was negative. Hemoglobin stable   Unspecified hypothyroidism  Continue Synthroid.     Spondylolisthesis of lumbar region  Status post lumbar decompression surgery for L4/L5 lumbar spondylosis and L4/L5 lateral recess stenosis and January 2015 by Dr. Christella Noa.   Salmonella Colitis  - started pt on IV rocephin on 2/18. She was on ciprofloxacin the last 2 days.  - 11 bm in the last 24 hours.   Clostridium difficile  -10/26/2013 C. difficile positive by PCR  -Start antibiotic protocol (patient's WBC> 15 which places him severe category)  -Stool culture pending  - pt on IV flagyl and vanco enemas changed to po vancomycin.  Acute on CKD stage 2/Metabolic acidosis. : - resolved. With fluids. - fena <1.  - probably a combination of intra vascular volume depletion and acute kidney injury.  - currently on IV normal saline 16ml/hr.  - bicarb improving.  - US RENAL negative for hydronephrosis.  - repeat UA and urine cultures pending.  - urine sodium is 11 and creatinine is 445 - DR Florene Glen consulted, recommended colloids and fluids.    Hypernatremia: - free water deficient.  - improved.    Leukocytosis: improving.  Changed antibiotics to IV rocephin.   Nutrition; pt is more awake today and SLP ordered to restart diet.       Code Status: full code Family Communication: discussed with the daughter over the phone and sister at bedside Disposition Plan: SNF when diarrhea improves. Social worker consulted.    Consultants:  Surgery for acute abdomen.   Procedures:  CT abdomen and pelvis.   CVL on 2//17  Antibiotics:  IV CIPRO 2/16 TO 2/18  IV ROCEPHIN 2/18  IV FLAGYL 2. 16  VANCOMYCIN PO 2/16.   HPI/Subjective: Not in distress,  At baseline.   Objective: Filed Vitals:   11/03/13 1324  BP: 136/80  Pulse: 75  Temp: 97.5 F (36.4 C)  Resp: 16    Intake/Output Summary (Last 24 hours) at 11/03/13 2038 Last data filed at 11/03/13 1800  Gross per 24 hour  Intake 2886.25 ml  Output      0 ml  Net 2886.25 ml   Filed Weights   10/26/13 0040  10/28/13 0400 10/29/13 0400  Weight: 107 kg (235 lb 14.3 oz) 113 kg (249 lb 1.9 oz) 114.1 kg (251 lb 8.7 oz)    Exam:   General:  ALERT , afebrile comfortable.   Cardiovascular: S1S2,   Respiratory: ctab.   Abdomen: soft no tenderness, bs+  Musculoskeletal: no pedal edema.   Data Reviewed: Basic Metabolic Panel:  Recent Labs Lab 10/28/13 0241 10/29/13 0445 10/30/13 4742 10/31/13 0509 11/01/13 0640 11/02/13 0445 11/03/13 0445 11/03/13 1208  NA 136* 145 148* 155* 153* 150* 142 141  K 3.9 3.8 3.6* 3.7 3.2* 3.3* 3.0* 3.2*  CL 101 109 115* 122* 120* 118* 111 109  CO2 16* 17* 16* 17* 20 21 20 21   GLUCOSE 107* 88 101* 120* 136* 126* 119* 122*  BUN 64* 62* 49* 39* 33* 30* 24* 21  CREATININE 3.03* 2.49* 1.68* 1.38* 1.28* 1.19* 1.02 0.94  CALCIUM 7.2* 7.6* 8.0* 8.3* 8.6 8.3* 7.8* 7.6*  MG 1.8 2.0 2.0 1.9 1.8  --   --   --   PHOS  --   --  3.3  --   --   --   --   --    Liver Function Tests:  Recent Labs Lab 10/29/13 0445 10/30/13 0605 10/31/13 0509 11/01/13 0640 11/02/13 0445  AST 34 43* 83* 103* 81*  ALT 20 23 36* 48* 45*  ALKPHOS 83 58 74 78 75  BILITOT 0.3 0.2* 0.2* 0.3 <0.2*  PROT 5.0* 5.1* 5.2* 5.2* 4.9*  ALBUMIN 2.6* 2.9* 2.8* 2.8* 2.7*   No results found for this basename: LIPASE, AMYLASE,  in the last 168 hours No results found for this basename: AMMONIA,  in the last 168 hours CBC:  Recent Labs Lab 10/30/13 0605 10/31/13 0509 11/01/13 0640 11/02/13 0445 11/03/13 0445  WBC 18.4* 13.1* 13.2* 15.4* 14.8*  NEUTROABS 14.9* 10.1* 9.7* 11.9* 11.7*  HGB 9.4* 10.1* 10.1* 9.6* 9.6*  HCT 26.9* 29.8* 28.7* 28.2* 27.8*  MCV 85.9 87.4 84.9 86.5 85.0  PLT 203 198 206 205 204   Cardiac Enzymes: No results found for this basename: CKTOTAL, CKMB, CKMBINDEX, TROPONINI,  in the last 168 hours BNP (last 3 results) No results found for this basename: PROBNP,  in the last 8760 hours CBG: No results found for this basename: GLUCAP,  in the last 168  hours  Recent Results (from the past 240 hour(s))  CLOSTRIDIUM DIFFICILE BY PCR     Status: Abnormal   Collection Time    10/26/13  4:18 AM      Result Value Ref Range Status   C difficile by pcr POSITIVE (*) NEGATIVE Final   Comment: CRITICAL RESULT CALLED TO, READ BACK BY AND VERIFIED WITH:     TRACY RN 11:50 10/26/13 ([email or web address])     Performed at [place]     Status: None   Collection Time    10/26/13  4:18 AM      Result Value Ref  Range Status   Specimen Description STOOL   Final   Special Requests NONE   Final   Culture     Final   Value: NO SALMONELLA, SHIGELLA, CAMPYLOBACTER, YERSINIA, OR E.COLI 0157:H7 ISOLATED     Note: REDUCED NORMAL FLORA PRESENT     Performed at [facility]   Report Status 10/30/2013 FINAL   Final  MRSA PCR SCREENING     Status: None   Collection Time    10/27/13  3:56 PM      Result Value Ref Range Status   MRSA by PCR NEGATIVE  NEGATIVE Final   Comment:            The GeneXpert MRSA Assay (FDA     approved for NASAL specimens     only), is one component of a     comprehensive MRSA colonization     surveillance program. It is not     intended to diagnose MRSA     infection nor to guide or     monitor treatment for     MRSA infections.  URINE CULTURE     Status: None   Collection Time    10/28/13 12:00 PM      Result Value Ref Range Status   Specimen Description URINE, CLEAN CATCH   Final   Special Requests NONE   Final   Culture  Setup Time     Final   Value: 10/29/2013 13:59     Performed at [place]     Final   Value: NO GROWTH     Performed at [facility]   Culture     Final   Value: NO GROWTH     Performed at [facility]   Report Status 10/30/2013 FINAL   Final     Studies: No results found.  Scheduled Meds: . buPROPion  150 mg Oral Daily  . cefTRIAXone (ROCEPHIN)  IV  1 g Intravenous Q24H  . cholecalciferol  1,000 Units Oral Daily  . feeding  supplement (ENSURE COMPLETE)  237 mL Oral BID BM  . heparin  5,000 Units Subcutaneous 3 times per day  . lamoTRIgine  200 mg Oral Daily  . [START ON 11/04/2013] levothyroxine  75 mcg Oral QAC breakfast  . metronidazole  500 mg Intravenous Q8H  . QUEtiapine  200 mg Oral QHS  . vancomycin  125 mg Oral QID   Continuous Infusions:    Principal Problem:   Acute renal failure Active Problems:   DEPRESSION   HYPERTENSION   Memory loss   ANEMIA   Unspecified hypothyroidism   Spondylolisthesis of lumbar region   Obesity   Right lower quadrant abdominal pain   Altered mental status   Acute kidney injury   UTI (urinary tract infection)    Time spent: 35 MINUTES.     [facility] Pager [phone] If 7PM-7AM, please contact night-coverage at [email or web address], password [facility] 11/03/2013, 8:38 PM  LOS: 9 days

## 2013-11-04 ENCOUNTER — Other Ambulatory Visit: Payer: Self-pay | Admitting: Internal Medicine

## 2013-11-04 DIAGNOSIS — B370 Candidal stomatitis: Secondary | ICD-10-CM

## 2013-11-04 DIAGNOSIS — A0472 Enterocolitis due to Clostridium difficile, not specified as recurrent: Secondary | ICD-10-CM

## 2013-11-04 DIAGNOSIS — A020 Salmonella enteritis: Secondary | ICD-10-CM

## 2013-11-04 LAB — BASIC METABOLIC PANEL
BUN: 18 mg/dL (ref 6–23)
CHLORIDE: 108 meq/L (ref 96–112)
CO2: 20 mEq/L (ref 19–32)
Calcium: 7.8 mg/dL — ABNORMAL LOW (ref 8.4–10.5)
Creatinine, Ser: 0.92 mg/dL (ref 0.50–1.10)
GFR calc Af Amer: 76 mL/min — ABNORMAL LOW (ref >90)
GFR, EST NON AFRICAN AMERICAN: 65 mL/min — AB (ref >90)
GLUCOSE: 104 mg/dL — AB (ref 70–99)
POTASSIUM: 2.9 meq/L — AB (ref 3.7–5.3)
SODIUM: 141 meq/L (ref 137–147)

## 2013-11-04 LAB — MAGNESIUM: Magnesium: 1.3 mg/dL — ABNORMAL LOW (ref 1.5–2.5)

## 2013-11-04 MED ORDER — ENSURE COMPLETE PO LIQD: 237.0000 mL | Freq: Two times a day (BID) | ORAL | Status: AC

## 2013-11-04 MED ORDER — MAGNESIUM SULFATE 4000MG/100ML IJ SOLN
4.0000 g | Freq: Once | INTRAMUSCULAR | Status: AC
  Administered 2013-11-04: 4 g via INTRAVENOUS
  Filled 2013-11-04: qty 100

## 2013-11-04 MED ORDER — FLUCONAZOLE 100 MG PO TABS
100.0000 mg | ORAL_TABLET | Freq: Every day | ORAL | Status: DC
  Administered 2013-11-04: 100 mg via ORAL
  Filled 2013-11-04 (×2): qty 1

## 2013-11-04 MED ORDER — FLUCONAZOLE 100 MG PO TABS: 100.0000 mg | ORAL_TABLET | Freq: Every day | ORAL | Status: DC

## 2013-11-04 MED ORDER — POTASSIUM CHLORIDE CRYS ER 20 MEQ PO TBCR
40.0000 meq | EXTENDED_RELEASE_TABLET | ORAL | Status: DC
  Filled 2013-11-04 (×2): qty 2

## 2013-11-04 MED ORDER — POTASSIUM CHLORIDE 10 MEQ/100ML IV SOLN
10.0000 meq | INTRAVENOUS | Status: AC
  Administered 2013-11-04 (×2): 10 meq via INTRAVENOUS
  Filled 2013-11-04 (×2): qty 100

## 2013-11-04 MED ORDER — POTASSIUM CHLORIDE 20 MEQ/15ML (10%) PO LIQD
40.0000 meq | ORAL | Status: AC
  Administered 2013-11-04 (×2): 40 meq via ORAL
  Filled 2013-11-04 (×2): qty 30

## 2013-11-04 MED ORDER — VANCOMYCIN 50 MG/ML ORAL SOLUTION: 125.0000 mg | Freq: Four times a day (QID) | ORAL | Status: DC

## 2013-11-04 NOTE — Progress Notes (Signed)
Physical Therapy Treatment Patient Details Name: [name] MRN: [id number] DOB: 06/07/1951 Today's Date: 11/04/2013 Time: 2778-2423 PT Time Calculation (min): 27 min  PT Assessment / Plan / Recommendation  History of Present Illness 62-yo BF PMHx depression, memory loss, frequent falls, hypertension, hyperlipidemia, depression, GERD, hypothyroidism,with history of lumbar spondylosis status post lumbar decompression surgery in January 2015, recently treated with antibiotic for UTI was brought in by family as patient complained of having loose / watery diarrhea of 3-4 episodes for past few days. Patient also has been noticed to be more confused and sleepy for past 2 days and her son feels that she may be taking some extra dose of oxycodone. Oxycodone was stopped. She was found to have infectious colitis of her ascending colon and caecum possibly from C DIFF and salmonella. Her stool pcr was positive for both. She was started on IV rocephin and IV flagyl along with Vancomycin enemas. 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 Her baseline creatinine is around 1. Her creatinine on admission is 2.94.   PT Comments   *Pt was saturated in urine at start of tx. ASsisted pt to [facility] and to perform pericare and change gown. She walked 56' with RW and performed BLE exercises. She is progressing with mobility. **  Follow Up Recommendations  SNF     Does the patient have the potential to tolerate intense rehabilitation     Barriers to Discharge        Equipment Recommendations  None recommended by PT    Recommendations for Other Services    Frequency Min 3X/week   Progress towards PT Goals Progress towards PT goals: Progressing toward goals  Plan Current plan remains appropriate    Precautions / Restrictions Precautions Precautions: Fall;Back Precaution Comments: incontinent of stool/CDiff; pt able to verbalize 2/3 back precautions Restrictions Weight Bearing Restrictions: No   Pertinent  Vitals/Pain **no c/o pain SaO2 93% on RA with walking 2/4 dyspnea with walking*    Mobility  Bed Mobility Overal bed mobility: Needs Assistance Bed Mobility: Sidelying to Sit;Rolling Rolling: Min assist Sidelying to sit: Min assist General bed mobility comments: verbal cues for self assist and back precautions Transfers Overall transfer level: Needs assistance Equipment used: Rolling walker (2 wheeled) Transfers: Sit to/from [employer] Transfers Sit to Stand: From elevated surface;Min guard Stand pivot transfers: Supervision General transfer comment: verbal cues for hand placement Ambulation/Gait Ambulation/Gait assistance: Min guard Ambulation Distance (Feet): 50 Feet Assistive device: Rolling walker (2 wheeled) Gait Pattern/deviations: Step-through pattern;Decreased step length - left;Decreased stance time - right Gait velocity interpretation: Below normal speed for age/gender General Gait Details: min/guard for safety, distance limited by fatigue/SOB, SaO2 93% on RA with walking    Exercises General Exercises - Lower Extremity Ankle Circles/Pumps: AROM;Both;10 reps;Seated Long Arc Quad: AROM;Both;10 reps;Seated   PT Diagnosis:    PT Problem List:   PT Treatment Interventions:     PT Goals (current goals can now be found in the care plan section) Acute Rehab PT Goals Patient Stated Goal: none stated PT Goal Formulation: With patient Time For Goal Achievement: 11/05/13 Potential to Achieve Goals: Fair  Visit Information  Last PT Received On: 11/04/13 Assistance Needed: +1 History of Present Illness: 62-yo BF PMHx depression, memory loss, frequent falls, hypertension, hyperlipidemia, depression, GERD, hypothyroidism,with history of lumbar spondylosis status post lumbar decompression surgery in January 2015, recently treated with antibiotic for UTI was brought in by family as patient complained of having loose / watery diarrhea of 3-4 episodes  for past few days.  Patient also has been noticed to be more confused and sleepy for past 2 days and her son feels that she may be taking some extra dose of oxycodone. Oxycodone was stopped. She was found to have infectious colitis of her ascending colon and caecum possibly from C DIFF and salmonella. Her stool pcr was positive for both. She was started on IV rocephin and IV flagyl along with Vancomycin enemas. 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 Her baseline creatinine is around 1. Her creatinine on admission is 2.94.    Subjective Data  Patient Stated Goal: none stated   Cognition  Cognition Arousal/Alertness: Awake/alert Behavior During Therapy: WFL for tasks assessed/performed Overall Cognitive Status: No family/caregiver present to determine baseline cognitive functioning Following Commands: Follows one step commands with increased time    Balance     End of Session PT - End of Session Equipment Utilized During Treatment: Gait belt Activity Tolerance: Patient limited by fatigue Patient left: with call bell/phone within reach;in chair Nurse Communication: Mobility status   GP     Blondell Reveal Kistler 11/04/2013, 2:03 PM [phone]

## 2013-11-04 NOTE — Progress Notes (Signed)
CRITICAL VALUE ALERT  Critical value received:  K+ 2.9  Date of notification:  11/04/2013  Time of notification:  0610  Critical value read back:yes  Nurse who received alert:  Tamsen Snider  MD notified (1st page):  Tylene Fantasia  Time of first page:  0610  MD notified (2nd page):  Time of second page:  Responding MD:K. Baltazar Najjar  Time MD responded: Notified via page, awaiting orders

## 2013-11-04 NOTE — Progress Notes (Signed)
EMS booked to transport patient to [place]. CVL d/c'd ad ordered. CVL site dressing clean, dry and intact. No c/o pain or discomfort. We will continue to monitor.

## 2013-11-04 NOTE — Progress Notes (Signed)
EMS transported pt to [place] receiving nurse notified of their departure.

## 2013-11-04 NOTE — Discharge Summary (Addendum)
Physician Discharge Summary  [name]:[id number] DOB: Feb 18, 1951 DOA: 10/25/2013  PCP: [name], MD  Admit date: 10/25/2013 Discharge date: 11/04/2013  Recommendations for Outpatient Follow-up:  1. Push fluids please for next few days 2. Continue oral vancomycin for minimum of 14 days, day 1 on 2/26.  Patient should follow up with primary care doctor before the end of 14 days to determine if her course of vancomycin should be extended past 14 days.  No imodium or lamotil please.   3. Repeat CMP in 1-2 weeks to check kidney function, liver function, and potassium.  CBC at that time to follow up leukocytosis and anemia.  BP check and restart BP medications if indicated.   4. Fluconazole through 2/27, then stop.    Discharge Diagnoses:  Principal Problem:   AKI due to dehydration from severe C. Diff colitis & Salmonella enteritis  Active Problems:   DEPRESSION   HYPERTENSION   Memory loss   ANEMIA   Unspecified hypothyroidism   Spondylolisthesis of lumbar region   Obesity   Right lower quadrant abdominal pain   Altered mental status   Acute kidney injury   Thrush   Discharge Condition: stable, improved  Diet recommendation: regular   Wt Readings from Last 3 Encounters:  10/29/13 114.1 kg (251 lb 8.7 oz)  09/18/13 117.119 kg (258 lb 3.2 oz)  09/18/13 117.119 kg (258 lb 3.2 oz)    History of present illness:  63 year old obese female with history of lumbar spondylosis status post lumbar decompression surgery in January 2015, hypertension, hyperlipidemia, depression, GERD, hypothyroidism, recently treated with antibiotic for UTI was brought in by family as patient complained of having loose / watery diarrhea of 3-4 episodes for past few days. Patient is confused during conversation she was primarily provided by her son at bedside. Patient was discharged to skilled nursing facility about 10 days back. For past 2 days she has been complaining of stools and right  lower quadrant pain radiating to the back. Patient also has been noticed to be more confused and sleepy for past 2 days and her son feels that she may be taking some extra dose of oxycodone. Patient also informs some nausea and vomiting but is unable to explain further. She did not have headache, dizziness, dysuria, weakness, syncope, muscle aches or joint pains.   Hospital Course:   Salmonella enterocolitis:  Completed course of antibiotics prior to discharge, initially with ciprofloxacin which was transitioned to ceftriaxone.  Her GI pathogen PCR was positive for salmonella, however her stool culture was negative. The stool culture was likely negative because she received several days of antibiotics prior to the stool culture being obtained by nursing. She completed a minimum of 10 days of antibiotics.  C. difficile colitis:  She was category I as does severe C. difficile colitis secondary to her high white blood cell count and renal failure. She was started on vancomycin enemas and IV Flagyl, and once she clinically improved it was able to tolerate by mouth, her vancomycin enemas were transitioned to oral vancomycin. Her diarrhea has persisted, but has been improving in frequency and texture. Her stools are now more applesauce consistency, and she is only having approximately 5-6 stools per day (down from 11 per day).  She is tolerating food and fluids.    Thrush:  Likely secondary to antibiotics as above.  Recommend fluconazole for 2 more days, through 2/27, then stop.    Altered mental status, acute metabolic encephalopathy, likely secondary to acute  renal failure and metabolic acidosis. Her mentation improved as her underlying medical illness improved. She was evaluated by physical therapy and is going to be discharged to [facility].  Hypertension: Blood pressures were initially low normal and her ARB and HCTZ were discontinued because of acute kidney injury. Her blood pressures have remained  within normal limits despite no blood pressure medications. She should have a repeat blood pressure check and BMP by her primary care doctor in approximately one to 2 weeks in her medications may be restarted at that time.  GERD, discontinued PPI secondary to C. difficile colitis.  Normocytic anemia, occult stool negative. Recommend further evaluation by her primary care doctor. Please repeat CBC and anemia evaluation as an outpatient.   Hypothyroidism, stable. Continue the Synthroid.   Spondylolisthesis of lumbar region: Status post lumbar decompression surgery for L4/L5 lumbar spondylosis at L4-L5 lateral recess stenosis in January 2015 by Dr. Christella Noa.  Metabolic acidosis was likely secondary to profuse diarrhea and corrected once her diarrhea had improved.  Acute on chronic kidney injury, baseline CK D. stage II. Her female with less than 1 and she was most likely dehydrated from her profound diarrhea. Renal ultrasound demonstrated no evidence of hydronephrosis. Her renal function improved with IV fluids.  Hypernatremia was likely secondary to free water deficit and improved with IV fluids and ability to tolerate oral fluids.  Hypokalemia, hypomagnesemia: Secondary to diarrhea. She received IV and oral supplementation. She should have repeat BMP and magnesium done approximately one week to make sure her potassium dose is correct. Recommend again starting oral magnesium supplementation until her diarrhea is completely resolved.  Leukocytosis, trend down with treatment of her C. difficile colitis and Salmonella enterocolitis.  Mild elevation of LFTs likely secondary to enterocolitis, mild dehydration, and resolving.  Repeat in 1-2 weeks.    Mild to moderate protein calorie malnutrition secondary to acute illness. She should consume a regular diet. She was seen by nutrition who recommended Ensure twice a day.  Consultants:  Surgery for acute abdomen.  Procedures:  CT abdomen and pelvis.   CVL on 2//17 Antibiotics:  IV CIPRO 2/16 TO 2/18  IV ROCEPHIN 2/18  IV FLAGYL 2. 16  VANCOMYCIN PO 2/16.    Discharge Exam: Filed Vitals:   11/04/13 0621  BP: 113/76  Pulse: 90  Temp: 98 F (36.7 C)  Resp: 16   Filed Vitals:   11/03/13 1324 11/03/13 2138 11/04/13 0621 11/04/13 1359  BP: 136/80 124/79 113/76   Pulse: 75 78 90   Temp: 97.5 F (36.4 C) 97.5 F (36.4 C) 98 F (36.7 C)   TempSrc: Oral Oral Oral   Resp: 16 16 16    Height:      Weight:      SpO2: 98% 99% 99% 93%    General: BF, NAD HEENT:  NCAT, MMM Cardiovascular: RRR, no mrg, 2+ pulses, warm extremities Respiratory: CTAB, no increased WOB ABD:  Hyperactive BS, soft, NT/ND MSK:  No LEE Neuro:  Grossly intact  Discharge Instructions      Discharge Orders   Future Orders Complete By Expires   Call MD for:  difficulty breathing, headache or visual disturbances  As directed    Call MD for:  extreme fatigue  As directed    Call MD for:  hives  As directed    Call MD for:  persistant dizziness or light-headedness  As directed    Call MD for:  persistant nausea and vomiting  As directed    Call MD  for:  severe uncontrolled pain  As directed    Call MD for:  temperature >100.4  As directed    Diet general  As directed    Increase activity slowly  As directed        Medication List    STOP taking these medications       esomeprazole 40 MG capsule  Commonly known as:  NEXIUM     oxyCODONE 5 MG immediate release tablet  Commonly known as:  Oxy IR/ROXICODONE     telmisartan-hydrochlorothiazide 80-25 MG per tablet  Commonly known as:  MICARDIS HCT      TAKE these medications       buPROPion 150 MG 24 hr tablet  Commonly known as:  WELLBUTRIN XL  Take 150 mg by mouth daily.     cholecalciferol 1000 UNITS tablet  Commonly known as:  VITAMIN D  Take 1,000 Units by mouth daily.     escitalopram 20 MG tablet  Commonly known as:  LEXAPRO  Take 20 mg by mouth daily.     feeding supplement  (ENSURE COMPLETE) Liqd  Take 237 mLs by mouth 2 (two) times daily between meals.     ferrous sulfate 325 (65 FE) MG tablet  Take 325 mg by mouth daily with breakfast.     fluconazole 100 MG tablet  Commonly known as:  DIFLUCAN  Take 1 tablet (100 mg total) by mouth daily.     hydrOXYzine 25 MG tablet  Commonly known as:  ATARAX/VISTARIL  Take 25 mg by mouth 3 (three) times daily as needed for itching.     lamoTRIgine 200 MG tablet  Commonly known as:  LAMICTAL  Take 200 mg by mouth daily.     levothyroxine 75 MCG tablet  Commonly known as:  SYNTHROID, LEVOTHROID  TAKE 1 TABLET (75 MCG TOTAL) BY MOUTH DAILY.     potassium chloride SA 20 MEQ tablet  Commonly known as:  K-DUR,KLOR-CON  TAKE 1 TABLET (20 MEQ TOTAL) BY MOUTH DAILY.     QUEtiapine 300 MG tablet  Commonly known as:  SEROQUEL  Take 300 mg by mouth at bedtime.     QUEtiapine 200 MG 24 hr tablet  Commonly known as:  SEROQUEL XR  Take 200 mg by mouth at bedtime.     rosuvastatin 10 MG tablet  Commonly known as:  CRESTOR  Take 10 mg by mouth at bedtime.     vancomycin 50 mg/mL oral solution  Commonly known as:  VANCOCIN  Take 2.5 mLs (125 mg total) by mouth 4 (four) times daily.       Follow-up Information   Follow up with [name], MD. Schedule an appointment as soon as possible for a visit in 2 weeks.   Specialty:  Internal Medicine   Contact information:   [place] [address] [phone]       The results of significant diagnostics from this hospitalization (including imaging, microbiology, ancillary and laboratory) are listed below for reference.    Significant Diagnostic Studies: Ct Abdomen Pelvis Wo Contrast  10/26/2013   CLINICAL DATA:  Dark stool  EXAM: CT ABDOMEN AND PELVIS WITHOUT CONTRAST  TECHNIQUE: Multidetector CT imaging of the abdomen and pelvis was performed following the standard protocol without IV contrast.  COMPARISON:  None.  FINDINGS: Motion  artifact, lack of intravenous contrast and body habitus markedly limits the examination.  Tiny pericardial effusion lateral to the left ventricle is noted.  There is wall thickening and  inflammatory change involving the see come and ascending colon. The hepatic flexure, transverse colon, and distal colon are unremarkable allowing for technical quality of the scan.  There is no pneumatosis. No extraluminal bowel gas. There is no portal venous gas. There is fluid and inflammatory stranding lateral to the cecum and ascending colon, in the region of the right pericolic gutter. Inflammatory cecal nodes.  The appendix is not clearly identified.  Small amount of free fluid layers in the anterior pelvis.  Uterus and adnexa are unremarkable.  Bladder is decompressed.  Liver, gallbladder, spleen, pancreas, adrenal glands are within normal limits.  Postoperative changes from lumbar fusion are noted.  IMPRESSION: Inflammatory changes involving the cecum and ascending colon are noted. Differential diagnosis includes infectious colitis, ischemia, acute appendicitis, and typhlitis. There is no pneumatosis. No abscess. No extraluminal bowel gas.   Electronically Signed   By: Maryclare Bean M.D.   On: 10/26/2013 12:18   Ct Head Wo Contrast  10/26/2013   CLINICAL DATA:  Altered mental status  EXAM: CT HEAD WITHOUT CONTRAST  TECHNIQUE: Contiguous axial images were obtained from the base of the skull through the vertex without intravenous contrast.  COMPARISON:  Prior CT from 09/21/2013  FINDINGS: Mild chronic microvascular ischemic disease is unchanged. There is no acute intracranial hemorrhage or infarct. No mass lesion or midline shift. Gray-white matter differentiation is well maintained. Ventricles are normal in size without evidence of hydrocephalus. CSF containing spaces are within normal limits. No extra-axial fluid collection.  The calvarium is intact.  Orbital soft tissues are within normal limits.  The paranasal sinuses and  mastoid air cells are well pneumatized and free of fluid.  Scalp soft tissues are unremarkable.  IMPRESSION: 1. No acute intracranial abnormality. 2. Mild chronic microvascular ischemic changes, stable.   Electronically Signed   By: Jeannine Boga M.D.   On: 10/26/2013 02:23   US Renal  10/28/2013   CLINICAL DATA:  Acute renal failure  EXAM: RENAL/URINARY TRACT ULTRASOUND COMPLETE  COMPARISON:  CT ABD/PELV WO CM dated 10/26/2013  FINDINGS: Right Kidney:  Length: 8.4 cm in length. Normal cortical echogenicity. No hydronephrosis. Trace right perinephric fluid.  Left Kidney:  Length: 9.3 cm in length. Normal echogenicity. No mass or hydronephrosis.  Bladder:  Bladder is decompressed.  Additional findings: Right pleural effusion. Small amount of ascites. Right lower quadrant bowel wall thickening is noted.  IMPRESSION: No evidence of hydronephrosis.  Minimal right perinephric fluid.   Electronically Signed   By: Maryclare Bean M.D.   On: 10/28/2013 12:42   Dg Chest Port 1 View  10/28/2013   CLINICAL DATA:  Increasing cough, leukocytosis, sepsis.  EXAM: PORTABLE CHEST - 1 VIEW  COMPARISON:  DG CHEST 1V PORT dated 10/27/2013  FINDINGS: Cardiomediastinal silhouette is unremarkable for this low inspiratory portable examination with crowded vasculature markings. The lungs are clear without pleural effusions or focal consolidations. Trachea projects midline and there is no pneumothorax. Included soft tissue planes and osseous structures are non-suspicious. Multiple EKG lines overlie the patient and may obscure subtle underlying pathology. Left subclavian central venous catheter with distal tip projecting at cavoatrial junction.  IMPRESSION: No acute cardiopulmonary process for this low inspiratory portable examination.  No apparent change in position of life support line.   Electronically Signed   By: Elon Alas   On: 10/28/2013 04:36   Dg Chest Port 1 View  10/27/2013   CLINICAL DATA:  Central line placement.   EXAM: PORTABLE CHEST - 1 VIEW  COMPARISON:  September 08, 2013.  FINDINGS: Interval placement of left subclavian catheter with distal tip in expected position of cavoatrial junction. No pneumothorax is noted. Hypoinflation of the lungs is noted. Mild central pulmonary vascular congestion is noted. Bony thorax appears intact.  IMPRESSION: Left subclavian catheter tip in expected position of cavoatrial junction. No pneumothorax is seen.   Electronically Signed   By: Sabino Dick M.D.   On: 10/27/2013 19:20   Dg Abd Portable 2v  10/27/2013   CLINICAL DATA:  Abdominal pain, questionable perforation  EXAM: PORTABLE ABDOMEN - 2 VIEW  COMPARISON:  CT ABD/PELV WO CM dated 10/26/2013  FINDINGS: There is motion artifact present on all views. No definite free extraluminal gas collections are demonstrated. The gas pattern is relatively nonspecific. The patient has undergone lower lumbar fusion.  IMPRESSION: There is no definite free extraluminal gas demonstrated. The study is limited due to motion artifact.   Electronically Signed   By: David  Martinique   On: 10/27/2013 14:38    Microbiology: Recent Results (from the past 240 hour(s))  CLOSTRIDIUM DIFFICILE BY PCR     Status: Abnormal   Collection Time    10/26/13  4:18 AM      Result Value Ref Range Status   C difficile by pcr POSITIVE (*) NEGATIVE Final   Comment: CRITICAL RESULT CALLED TO, READ BACK BY AND VERIFIED WITH:     TRACY RN 11:50 10/26/13 ([email or web address])     Performed at [place] [facility]     Status: None   Collection Time    10/26/13  4:18 AM      Result Value Ref Range Status   Specimen Description STOOL   Final   Special Requests NONE   Final   Culture     Final   Value: NO SALMONELLA, SHIGELLA, CAMPYLOBACTER, YERSINIA, OR E.COLI [id number]:H7 ISOLATED     Note: REDUCED NORMAL FLORA PRESENT     Performed at [facility]   Report Status 10/30/2013 FINAL   Final  MRSA PCR SCREENING     Status: None   Collection Time    10/27/13   3:56 PM      Result Value Ref Range Status   MRSA by PCR NEGATIVE  NEGATIVE Final   Comment:            The GeneXpert MRSA Assay (FDA     approved for NASAL specimens     only), is one component of a     comprehensive MRSA colonization     surveillance program. It is not     intended to diagnose MRSA     infection nor to guide or     monitor treatment for     MRSA infections.  URINE CULTURE     Status: None   Collection Time    10/28/13 12:00 PM      Result Value Ref Range Status   Specimen Description URINE, CLEAN CATCH   Final   Special Requests NONE   Final   Culture  Setup Time     Final   Value: 10/29/2013 13:59     Performed at [facility]     Final   Value: NO GROWTH     Performed at [facility]   Culture     Final   Value: NO GROWTH     Performed at [facility]   Report Status 10/30/2013 FINAL   Final  Labs: Basic Metabolic Panel:  Recent Labs Lab 10/29/13 0445 10/30/13 5366 10/31/13 0509 11/01/13 0640 11/02/13 0445 11/03/13 0445 11/03/13 1208 11/04/13 0425  NA 145 148* 155* 153* 150* 142 141 141  K 3.8 3.6* 3.7 3.2* 3.3* 3.0* 3.2* 2.9*  CL 109 115* 122* 120* 118* 111 109 108  CO2 17* 16* 17* 20 21 20 21 20   GLUCOSE 88 101* 120* 136* 126* 119* 122* 104*  BUN 62* 49* 39* 33* 30* 24* 21 18  CREATININE 2.49* 1.68* 1.38* 1.28* 1.19* 1.02 0.94 0.92  CALCIUM 7.6* 8.0* 8.3* 8.6 8.3* 7.8* 7.6* 7.8*  MG 2.0 2.0 1.9 1.8  --   --   --  1.3*  PHOS  --  3.3  --   --   --   --   --   --    Liver Function Tests:  Recent Labs Lab 10/29/13 0445 10/30/13 0605 10/31/13 0509 11/01/13 0640 11/02/13 0445  AST 34 43* 83* 103* 81*  ALT 20 23 36* 48* 45*  ALKPHOS 83 58 74 78 75  BILITOT 0.3 0.2* 0.2* 0.3 <0.2*  PROT 5.0* 5.1* 5.2* 5.2* 4.9*  ALBUMIN 2.6* 2.9* 2.8* 2.8* 2.7*   No results found for this basename: LIPASE, AMYLASE,  in the last 168 hours No results found for this basename: AMMONIA,  in the last 168  hours CBC:  Recent Labs Lab 10/30/13 0605 10/31/13 0509 11/01/13 0640 11/02/13 0445 11/03/13 0445  WBC 18.4* 13.1* 13.2* 15.4* 14.8*  NEUTROABS 14.9* 10.1* 9.7* 11.9* 11.7*  HGB 9.4* 10.1* 10.1* 9.6* 9.6*  HCT 26.9* 29.8* 28.7* 28.2* 27.8*  MCV 85.9 87.4 84.9 86.5 85.0  PLT 203 198 206 205 204   Cardiac Enzymes: No results found for this basename: CKTOTAL, CKMB, CKMBINDEX, TROPONINI,  in the last 168 hours BNP: BNP (last 3 results) No results found for this basename: PROBNP,  in the last 8760 hours CBG: No results found for this basename: GLUCAP,  in the last 168 hours  Time coordinating discharge: 45 minutes  Signed:  Chancellor Vanderloop  [facility] 11/04/2013, 2:25 PM

## 2013-11-04 NOTE — Progress Notes (Signed)
Report called to Nurse Amy at [facility].   We will call EMS as soon as the IV Nurse remove the CVL and we will also inform AMY as soon as EMS gets here.Patient was made aware of the transfer.

## 2013-11-04 NOTE — Progress Notes (Signed)
Pt has a SNF bed at [facility] today if she is stable for d/c. Will continue to follow to assist with d/c planning.  Werner Lean LCSW [phone]

## 2013-11-05 NOTE — Progress Notes (Signed)
Clinical Social Work Department CLINICAL SOCIAL WORK PLACEMENT NOTE 11/05/2013  Patient:  SHEKIA, KUPER  Account Number:  [id number] Admit date:  10/25/2013  Clinical Social Worker:  Werner Lean, LCSW  Date/time:  10/30/2013 03:58 PM  Clinical Social Work is seeking post-discharge placement for this patient at the following level of care:   SKILLED NURSING   (*CSW will update this form in Epic as items are completed)   10/30/2013  Patient/family provided with [place] Department of Clinical Social Work's list of facilities offering this level of care within the geographic area requested by the patient (or if unable, by the patient's family).  10/30/2013  Patient/family informed of their freedom to choose among providers that offer the needed level of care, that participate in Medicare, Medicaid or managed care program needed by the patient, have an available bed and are willing to accept the patient.    Patient/family informed of [facility]' ownership interest in [facility], as well as of the fact that they are under no obligation to receive care at this facility.  PASARR submitted to EDS on 10/30/2013 PASARR number received from EDS on 10/30/2013  FL2 transmitted to all facilities in geographic area requested by pt/family on  10/30/2013 FL2 transmitted to all facilities within larger geographic area on   Patient informed that his/her managed care company has contracts with or will negotiate with  certain facilities, including the following:     Patient/family informed of bed offers received:  11/02/2013 Patient chooses bed at [facility] Physician recommends and patient chooses bed at    Patient to be transferred to [facility] on  11/04/2013 Patient to be transferred to facility by EMS  The following physician request were entered in Epic:   Additional Comments:  Werner Lean LCSW [phone]

## 2013-11-08 ENCOUNTER — Emergency Department (HOSPITAL_COMMUNITY): Payer: Medicare Other

## 2013-11-08 ENCOUNTER — Encounter (HOSPITAL_COMMUNITY): Payer: Self-pay | Admitting: Emergency Medicine

## 2013-11-08 ENCOUNTER — Inpatient Hospital Stay (HOSPITAL_COMMUNITY)
Admission: EM | Admit: 2013-11-08 | Discharge: 2014-01-08 | DRG: 853 | Disposition: E | Payer: Medicare Other | Attending: Internal Medicine | Admitting: Internal Medicine

## 2013-11-08 ENCOUNTER — Inpatient Hospital Stay (HOSPITAL_COMMUNITY): Payer: Medicare Other

## 2013-11-08 DIAGNOSIS — Z66 Do not resuscitate: Secondary | ICD-10-CM | POA: Diagnosis not present

## 2013-11-08 DIAGNOSIS — I129 Hypertensive chronic kidney disease with stage 1 through stage 4 chronic kidney disease, or unspecified chronic kidney disease: Secondary | ICD-10-CM | POA: Diagnosis present

## 2013-11-08 DIAGNOSIS — Z860100 Personal history of colon polyps, unspecified: Secondary | ICD-10-CM

## 2013-11-08 DIAGNOSIS — A020 Salmonella enteritis: Secondary | ICD-10-CM

## 2013-11-08 DIAGNOSIS — R110 Nausea: Secondary | ICD-10-CM

## 2013-11-08 DIAGNOSIS — J9600 Acute respiratory failure, unspecified whether with hypoxia or hypercapnia: Secondary | ICD-10-CM

## 2013-11-08 DIAGNOSIS — A0471 Enterocolitis due to Clostridium difficile, recurrent: Secondary | ICD-10-CM

## 2013-11-08 DIAGNOSIS — N183 Chronic kidney disease, stage 3 unspecified: Secondary | ICD-10-CM | POA: Diagnosis present

## 2013-11-08 DIAGNOSIS — I2789 Other specified pulmonary heart diseases: Secondary | ICD-10-CM | POA: Diagnosis present

## 2013-11-08 DIAGNOSIS — I5041 Acute combined systolic (congestive) and diastolic (congestive) heart failure: Secondary | ICD-10-CM | POA: Diagnosis present

## 2013-11-08 DIAGNOSIS — I272 Pulmonary hypertension, unspecified: Secondary | ICD-10-CM | POA: Diagnosis present

## 2013-11-08 DIAGNOSIS — J189 Pneumonia, unspecified organism: Secondary | ICD-10-CM

## 2013-11-08 DIAGNOSIS — E876 Hypokalemia: Secondary | ICD-10-CM

## 2013-11-08 DIAGNOSIS — G479 Sleep disorder, unspecified: Secondary | ICD-10-CM | POA: Diagnosis present

## 2013-11-08 DIAGNOSIS — A419 Sepsis, unspecified organism: Secondary | ICD-10-CM | POA: Diagnosis present

## 2013-11-08 DIAGNOSIS — A0472 Enterocolitis due to Clostridium difficile, not specified as recurrent: Secondary | ICD-10-CM | POA: Diagnosis present

## 2013-11-08 DIAGNOSIS — Z113 Encounter for screening for infections with a predominantly sexual mode of transmission: Secondary | ICD-10-CM

## 2013-11-08 DIAGNOSIS — Z6841 Body Mass Index (BMI) 40.0 and over, adult: Secondary | ICD-10-CM

## 2013-11-08 DIAGNOSIS — J690 Pneumonitis due to inhalation of food and vomit: Secondary | ICD-10-CM | POA: Diagnosis present

## 2013-11-08 DIAGNOSIS — Z79899 Other long term (current) drug therapy: Secondary | ICD-10-CM

## 2013-11-08 DIAGNOSIS — I498 Other specified cardiac arrhythmias: Secondary | ICD-10-CM | POA: Diagnosis present

## 2013-11-08 DIAGNOSIS — I10 Essential (primary) hypertension: Secondary | ICD-10-CM

## 2013-11-08 DIAGNOSIS — I509 Heart failure, unspecified: Secondary | ICD-10-CM

## 2013-11-08 DIAGNOSIS — R1314 Dysphagia, pharyngoesophageal phase: Secondary | ICD-10-CM | POA: Diagnosis present

## 2013-11-08 DIAGNOSIS — A029 Salmonella infection, unspecified: Secondary | ICD-10-CM | POA: Diagnosis present

## 2013-11-08 DIAGNOSIS — R6521 Severe sepsis with septic shock: Secondary | ICD-10-CM

## 2013-11-08 DIAGNOSIS — E785 Hyperlipidemia, unspecified: Secondary | ICD-10-CM

## 2013-11-08 DIAGNOSIS — M549 Dorsalgia, unspecified: Secondary | ICD-10-CM | POA: Diagnosis present

## 2013-11-08 DIAGNOSIS — Z96659 Presence of unspecified artificial knee joint: Secondary | ICD-10-CM

## 2013-11-08 DIAGNOSIS — K922 Gastrointestinal hemorrhage, unspecified: Secondary | ICD-10-CM

## 2013-11-08 DIAGNOSIS — K219 Gastro-esophageal reflux disease without esophagitis: Secondary | ICD-10-CM

## 2013-11-08 DIAGNOSIS — E039 Hypothyroidism, unspecified: Secondary | ICD-10-CM

## 2013-11-08 DIAGNOSIS — E872 Acidosis, unspecified: Secondary | ICD-10-CM | POA: Diagnosis not present

## 2013-11-08 DIAGNOSIS — Z801 Family history of malignant neoplasm of trachea, bronchus and lung: Secondary | ICD-10-CM

## 2013-11-08 DIAGNOSIS — I5021 Acute systolic (congestive) heart failure: Secondary | ICD-10-CM

## 2013-11-08 DIAGNOSIS — R188 Other ascites: Secondary | ICD-10-CM

## 2013-11-08 DIAGNOSIS — R578 Other shock: Secondary | ICD-10-CM | POA: Diagnosis not present

## 2013-11-08 DIAGNOSIS — R579 Shock, unspecified: Secondary | ICD-10-CM

## 2013-11-08 DIAGNOSIS — M1710 Unilateral primary osteoarthritis, unspecified knee: Secondary | ICD-10-CM | POA: Diagnosis present

## 2013-11-08 DIAGNOSIS — K633 Ulcer of intestine: Secondary | ICD-10-CM | POA: Diagnosis present

## 2013-11-08 DIAGNOSIS — K3184 Gastroparesis: Secondary | ICD-10-CM | POA: Diagnosis not present

## 2013-11-08 DIAGNOSIS — K209 Esophagitis, unspecified without bleeding: Secondary | ICD-10-CM

## 2013-11-08 DIAGNOSIS — R4182 Altered mental status, unspecified: Secondary | ICD-10-CM

## 2013-11-08 DIAGNOSIS — M79609 Pain in unspecified limb: Secondary | ICD-10-CM

## 2013-11-08 DIAGNOSIS — I82629 Acute embolism and thrombosis of deep veins of unspecified upper extremity: Secondary | ICD-10-CM | POA: Diagnosis not present

## 2013-11-08 DIAGNOSIS — K55059 Acute (reversible) ischemia of intestine, part and extent unspecified: Secondary | ICD-10-CM | POA: Diagnosis present

## 2013-11-08 DIAGNOSIS — N179 Acute kidney failure, unspecified: Secondary | ICD-10-CM

## 2013-11-08 DIAGNOSIS — F29 Unspecified psychosis not due to a substance or known physiological condition: Secondary | ICD-10-CM | POA: Diagnosis not present

## 2013-11-08 DIAGNOSIS — G250 Essential tremor: Secondary | ICD-10-CM

## 2013-11-08 DIAGNOSIS — F3289 Other specified depressive episodes: Secondary | ICD-10-CM

## 2013-11-08 DIAGNOSIS — D649 Anemia, unspecified: Secondary | ICD-10-CM

## 2013-11-08 DIAGNOSIS — I82409 Acute embolism and thrombosis of unspecified deep veins of unspecified lower extremity: Secondary | ICD-10-CM

## 2013-11-08 DIAGNOSIS — R6520 Severe sepsis without septic shock: Secondary | ICD-10-CM

## 2013-11-08 DIAGNOSIS — E669 Obesity, unspecified: Secondary | ICD-10-CM

## 2013-11-08 DIAGNOSIS — K263 Acute duodenal ulcer without hemorrhage or perforation: Secondary | ICD-10-CM | POA: Diagnosis present

## 2013-11-08 DIAGNOSIS — R1319 Other dysphagia: Secondary | ICD-10-CM

## 2013-11-08 DIAGNOSIS — K2100 Gastro-esophageal reflux disease with esophagitis, without bleeding: Secondary | ICD-10-CM | POA: Diagnosis present

## 2013-11-08 DIAGNOSIS — G252 Other specified forms of tremor: Secondary | ICD-10-CM

## 2013-11-08 DIAGNOSIS — K5289 Other specified noninfective gastroenteritis and colitis: Secondary | ICD-10-CM | POA: Diagnosis present

## 2013-11-08 DIAGNOSIS — E440 Moderate protein-calorie malnutrition: Secondary | ICD-10-CM | POA: Diagnosis not present

## 2013-11-08 DIAGNOSIS — K920 Hematemesis: Secondary | ICD-10-CM | POA: Diagnosis present

## 2013-11-08 DIAGNOSIS — Z981 Arthrodesis status: Secondary | ICD-10-CM

## 2013-11-08 DIAGNOSIS — K5730 Diverticulosis of large intestine without perforation or abscess without bleeding: Secondary | ICD-10-CM

## 2013-11-08 DIAGNOSIS — F329 Major depressive disorder, single episode, unspecified: Secondary | ICD-10-CM | POA: Diagnosis present

## 2013-11-08 DIAGNOSIS — D62 Acute posthemorrhagic anemia: Secondary | ICD-10-CM | POA: Diagnosis not present

## 2013-11-08 DIAGNOSIS — N390 Urinary tract infection, site not specified: Secondary | ICD-10-CM

## 2013-11-08 DIAGNOSIS — Z8249 Family history of ischemic heart disease and other diseases of the circulatory system: Secondary | ICD-10-CM

## 2013-11-08 DIAGNOSIS — E870 Hyperosmolality and hypernatremia: Secondary | ICD-10-CM | POA: Diagnosis not present

## 2013-11-08 DIAGNOSIS — G8929 Other chronic pain: Secondary | ICD-10-CM | POA: Diagnosis present

## 2013-11-08 DIAGNOSIS — R609 Edema, unspecified: Secondary | ICD-10-CM

## 2013-11-08 DIAGNOSIS — E43 Unspecified severe protein-calorie malnutrition: Secondary | ICD-10-CM

## 2013-11-08 DIAGNOSIS — Z8659 Personal history of other mental and behavioral disorders: Secondary | ICD-10-CM

## 2013-11-08 DIAGNOSIS — R269 Unspecified abnormalities of gait and mobility: Secondary | ICD-10-CM

## 2013-11-08 DIAGNOSIS — K260 Acute duodenal ulcer with hemorrhage: Secondary | ICD-10-CM | POA: Diagnosis present

## 2013-11-08 DIAGNOSIS — R1031 Right lower quadrant pain: Secondary | ICD-10-CM

## 2013-11-08 DIAGNOSIS — K659 Peritonitis, unspecified: Secondary | ICD-10-CM | POA: Diagnosis not present

## 2013-11-08 DIAGNOSIS — K2981 Duodenitis with bleeding: Secondary | ICD-10-CM | POA: Diagnosis not present

## 2013-11-08 DIAGNOSIS — Z800 Family history of malignant neoplasm of digestive organs: Secondary | ICD-10-CM

## 2013-11-08 DIAGNOSIS — D126 Benign neoplasm of colon, unspecified: Secondary | ICD-10-CM | POA: Diagnosis present

## 2013-11-08 LAB — I-STAT TROPONIN, ED: Troponin i, poc: 0 ng/mL (ref 0.00–0.08)

## 2013-11-08 LAB — CBC WITH DIFFERENTIAL/PLATELET
Basophils Absolute: 0 10*3/uL (ref 0.0–0.1)
Basophils Relative: 0 % (ref 0–1)
Eosinophils Absolute: 0 10*3/uL (ref 0.0–0.7)
Eosinophils Relative: 0 % (ref 0–5)
HEMATOCRIT: 25.8 % — AB (ref 36.0–46.0)
Hemoglobin: 9.1 g/dL — ABNORMAL LOW (ref 12.0–15.0)
LYMPHS ABS: 0.6 10*3/uL — AB (ref 0.7–4.0)
LYMPHS PCT: 4 % — AB (ref 12–46)
MCH: 30.4 pg (ref 26.0–34.0)
MCHC: 35.3 g/dL (ref 30.0–36.0)
MCV: 86.3 fL (ref 78.0–100.0)
MONOS PCT: 4 % (ref 3–12)
Monocytes Absolute: 0.6 10*3/uL (ref 0.1–1.0)
Neutro Abs: 14.6 10*3/uL — ABNORMAL HIGH (ref 1.7–7.7)
Neutrophils Relative %: 93 % — ABNORMAL HIGH (ref 43–77)
Platelets: 323 10*3/uL (ref 150–400)
RBC: 2.99 MIL/uL — ABNORMAL LOW (ref 3.87–5.11)
RDW: 18.9 % — ABNORMAL HIGH (ref 11.5–15.5)
WBC: 15.8 10*3/uL — AB (ref 4.0–10.5)

## 2013-11-08 LAB — URINALYSIS, ROUTINE W REFLEX MICROSCOPIC
Glucose, UA: 100 mg/dL — AB
KETONES UR: 15 mg/dL — AB
Nitrite: POSITIVE — AB
PROTEIN: 100 mg/dL — AB
Specific Gravity, Urine: 1.036 — ABNORMAL HIGH (ref 1.005–1.030)
Urobilinogen, UA: 0.2 mg/dL (ref 0.0–1.0)
pH: 6 (ref 5.0–8.0)

## 2013-11-08 LAB — I-STAT ARTERIAL BLOOD GAS, ED
ACID-BASE EXCESS: 1 mmol/L (ref 0.0–2.0)
Bicarbonate: 24.8 mEq/L — ABNORMAL HIGH (ref 20.0–24.0)
O2 Saturation: 100 %
PO2 ART: 336 mmHg — AB (ref 80.0–100.0)
Patient temperature: 98.6
TCO2: 26 mmol/L (ref 0–100)
pCO2 arterial: 34 mmHg — ABNORMAL LOW (ref 35.0–45.0)
pH, Arterial: 7.47 — ABNORMAL HIGH (ref 7.350–7.450)

## 2013-11-08 LAB — URINE MICROSCOPIC-ADD ON

## 2013-11-08 LAB — COMPREHENSIVE METABOLIC PANEL
ALT: 29 U/L (ref 0–35)
AST: 29 U/L (ref 0–37)
Albumin: 2.3 g/dL — ABNORMAL LOW (ref 3.5–5.2)
Alkaline Phosphatase: 89 U/L (ref 39–117)
BUN: 18 mg/dL (ref 6–23)
CALCIUM: 8.6 mg/dL (ref 8.4–10.5)
CO2: 20 meq/L (ref 19–32)
CREATININE: 1.04 mg/dL (ref 0.50–1.10)
Chloride: 102 mEq/L (ref 96–112)
GFR calc Af Amer: 65 mL/min — ABNORMAL LOW (ref >90)
GFR calc non Af Amer: 56 mL/min — ABNORMAL LOW (ref >90)
Glucose, Bld: 131 mg/dL — ABNORMAL HIGH (ref 70–99)
Potassium: 3.2 mEq/L — ABNORMAL LOW (ref 3.7–5.3)
Sodium: 139 mEq/L (ref 137–147)
Total Bilirubin: 0.3 mg/dL (ref 0.3–1.2)
Total Protein: 5.7 g/dL — ABNORMAL LOW (ref 6.0–8.3)

## 2013-11-08 LAB — GLUCOSE, CAPILLARY: Glucose-Capillary: 112 mg/dL — ABNORMAL HIGH (ref 70–99)

## 2013-11-08 LAB — TROPONIN I
Troponin I: 0.31 ng/mL (ref <0.30)
Troponin I: <0.3 ng/mL (ref <0.30)

## 2013-11-08 LAB — CARBOXYHEMOGLOBIN
Carboxyhemoglobin: 1.6 % — ABNORMAL HIGH (ref 0.5–1.5)
Methemoglobin: 1.1 % (ref 0.0–1.5)
O2 Saturation: 76 %
TOTAL HEMOGLOBIN: 8.4 g/dL — AB (ref 12.0–16.0)

## 2013-11-08 LAB — PROCALCITONIN: PROCALCITONIN: 7.72 ng/mL

## 2013-11-08 LAB — MAGNESIUM: Magnesium: 1.7 mg/dL (ref 1.5–2.5)

## 2013-11-08 LAB — LACTIC ACID, PLASMA
Lactic Acid, Venous: 1.6 mmol/L (ref 0.5–2.2)
Lactic Acid, Venous: 0.7 mmol/L (ref 0.5–2.2)

## 2013-11-08 LAB — I-STAT CG4 LACTIC ACID, ED: Lactic Acid, Venous: 2.39 mmol/L — ABNORMAL HIGH (ref 0.5–2.2)

## 2013-11-08 LAB — PRO B NATRIURETIC PEPTIDE: Pro B Natriuretic peptide (BNP): 1572 pg/mL — ABNORMAL HIGH (ref 0–125)

## 2013-11-08 LAB — MRSA PCR SCREENING: MRSA by PCR: NEGATIVE

## 2013-11-08 MED ORDER — ASPIRIN 81 MG PO CHEW
324.0000 mg | CHEWABLE_TABLET | Freq: Once | ORAL | Status: AC
  Administered 2013-11-08: 324 mg via ORAL
  Filled 2013-11-08: qty 4

## 2013-11-08 MED ORDER — HEPARIN (PORCINE) IN NACL 100-0.45 UNIT/ML-% IJ SOLN
1150.0000 [IU]/h | INTRAMUSCULAR | Status: DC
  Administered 2013-11-08: 1150 [IU]/h via INTRAVENOUS
  Filled 2013-11-08 (×2): qty 250

## 2013-11-08 MED ORDER — HEPARIN BOLUS VIA INFUSION
4000.0000 [IU] | Freq: Once | INTRAVENOUS | Status: AC
  Administered 2013-11-08: 4000 [IU] via INTRAVENOUS
  Filled 2013-11-08: qty 4000

## 2013-11-08 MED ORDER — VANCOMYCIN HCL IN DEXTROSE 1-5 GM/200ML-% IV SOLN
1000.0000 mg | Freq: Two times a day (BID) | INTRAVENOUS | Status: DC
  Administered 2013-11-09 – 2013-11-11 (×6): 1000 mg via INTRAVENOUS
  Filled 2013-11-08 (×7): qty 200

## 2013-11-08 MED ORDER — VANCOMYCIN HCL 10 G IV SOLR: 20.0000 mg/kg | Freq: Once | INTRAVENOUS | Status: DC

## 2013-11-08 MED ORDER — FUROSEMIDE 10 MG/ML IJ SOLN
80.0000 mg | Freq: Once | INTRAMUSCULAR | Status: AC
  Administered 2013-11-08: 80 mg via INTRAVENOUS
  Filled 2013-11-08: qty 8

## 2013-11-08 MED ORDER — PIPERACILLIN-TAZOBACTAM 3.375 G IVPB
3.3750 g | Freq: Three times a day (TID) | INTRAVENOUS | Status: DC
  Administered 2013-11-09 – 2013-11-13 (×14): 3.375 g via INTRAVENOUS
  Filled 2013-11-08 (×15): qty 50

## 2013-11-08 MED ORDER — FUROSEMIDE 10 MG/ML IJ SOLN
20.0000 mg | Freq: Once | INTRAMUSCULAR | Status: DC
  Filled 2013-11-08: qty 2

## 2013-11-08 MED ORDER — VANCOMYCIN HCL 10 G IV SOLR
2000.0000 mg | Freq: Once | INTRAVENOUS | Status: AC
  Administered 2013-11-08: 2000 mg via INTRAVENOUS
  Filled 2013-11-08: qty 2000

## 2013-11-08 MED ORDER — HEPARIN SODIUM (PORCINE) 5000 UNIT/ML IJ SOLN
5000.0000 [IU] | Freq: Three times a day (TID) | INTRAMUSCULAR | Status: DC
  Filled 2013-11-08 (×2): qty 1

## 2013-11-08 MED ORDER — DIPHENHYDRAMINE HCL 50 MG/ML IJ SOLN
25.0000 mg | Freq: Once | INTRAMUSCULAR | Status: AC
  Administered 2013-11-08: 25 mg via INTRAVENOUS
  Filled 2013-11-08: qty 1

## 2013-11-08 MED ORDER — POTASSIUM CHLORIDE 10 MEQ/50ML IV SOLN
10.0000 meq | INTRAVENOUS | Status: AC
  Administered 2013-11-09 (×4): 10 meq via INTRAVENOUS
  Filled 2013-11-08 (×2): qty 50

## 2013-11-08 MED ORDER — FUROSEMIDE 10 MG/ML IJ SOLN
60.0000 mg | Freq: Once | INTRAMUSCULAR | Status: DC
  Filled 2013-11-08: qty 6

## 2013-11-08 MED ORDER — PIPERACILLIN-TAZOBACTAM 3.375 G IVPB 30 MIN
3.3750 g | Freq: Once | INTRAVENOUS | Status: AC
  Administered 2013-11-08: 3.375 g via INTRAVENOUS
  Filled 2013-11-08 (×2): qty 50

## 2013-11-08 NOTE — Progress Notes (Signed)
Dr. Justin Mend notified of ABG, fio2 weaned to 60% post ABG results.  RN aware.  Sat 100%
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
## 2013-11-08 NOTE — Procedures (Signed)
Central Venous Catheter Insertion Procedure Note [name] [id number] 07/30/1951  Procedure: Insertion of Central Venous Catheter Indications: Assessment of intravascular volume  Procedure Details Consent: Risks of procedure as well as the alternatives and risks of each were explained to the (patient/caregiver).  Consent for procedure obtained. Time Out: Verified patient identification, verified procedure, site/side was marked, verified correct patient position, special equipment/implants available, medications/allergies/relevent history reviewed, required imaging and test results available.  Performed  Maximum sterile technique was used including antiseptics, cap, gloves, gown, hand hygiene, mask and sheet. Skin prep: Chlorhexidine; local anesthetic administered A antimicrobial bonded/coated triple lumen catheter was placed in the right internal jugular vein using the Seldinger technique.  Evaluation Blood flow good Complications: No apparent complications Patient did tolerate procedure well. Chest X-ray ordered to verify placement.  CXR: normal.  Daniel BensimhonMD 11/19/2013, 10:39 PM
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
## 2013-11-08 NOTE — ED Notes (Signed)
Lactic Acid 2.9mmol/L results given to Dr. Reather Converse

## 2013-11-08 NOTE — ED Notes (Signed)
Patient presents to ED via EMS from [facility] home with c/o shortness of breath. When EMS arrived pink frothy sputum noted, EMS placed cpap on patient, placed 20g to left AC.

## 2013-11-08 NOTE — Progress Notes (Signed)
Pt brought to ED via EMS on cpap.  Per EMT, pt had frothy pink secretions from mouth at nsg home.  [facility] w/ crackles t/o.  Dr. Justin Mend at bedside and orders Bipap.
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
## 2013-11-09 ENCOUNTER — Inpatient Hospital Stay (HOSPITAL_COMMUNITY): Payer: Medicare Other

## 2013-11-09 DIAGNOSIS — J9600 Acute respiratory failure, unspecified whether with hypoxia or hypercapnia: Secondary | ICD-10-CM

## 2013-11-09 DIAGNOSIS — I5021 Acute systolic (congestive) heart failure: Secondary | ICD-10-CM

## 2013-11-09 DIAGNOSIS — I369 Nonrheumatic tricuspid valve disorder, unspecified: Secondary | ICD-10-CM

## 2013-11-09 DIAGNOSIS — N390 Urinary tract infection, site not specified: Secondary | ICD-10-CM

## 2013-11-09 DIAGNOSIS — J690 Pneumonitis due to inhalation of food and vomit: Secondary | ICD-10-CM | POA: Diagnosis present

## 2013-11-09 DIAGNOSIS — J189 Pneumonia, unspecified organism: Secondary | ICD-10-CM

## 2013-11-09 DIAGNOSIS — I509 Heart failure, unspecified: Secondary | ICD-10-CM

## 2013-11-09 DIAGNOSIS — K922 Gastrointestinal hemorrhage, unspecified: Secondary | ICD-10-CM

## 2013-11-09 DIAGNOSIS — K920 Hematemesis: Secondary | ICD-10-CM | POA: Diagnosis present

## 2013-11-09 LAB — LACTIC ACID, PLASMA
LACTIC ACID, VENOUS: 0.7 mmol/L (ref 0.5–2.2)
LACTIC ACID, VENOUS: 0.8 mmol/L (ref 0.5–2.2)
LACTIC ACID, VENOUS: 0.9 mmol/L (ref 0.5–2.2)
Lactic Acid, Venous: 0.7 mmol/L (ref 0.5–2.2)

## 2013-11-09 LAB — CBC
HCT: 23.8 % — ABNORMAL LOW (ref 36.0–46.0)
HCT: 23.6 % — ABNORMAL LOW (ref 36.0–46.0)
HCT: 23.6 % — ABNORMAL LOW (ref 36.0–46.0)
HEMATOCRIT: 25 % — AB (ref 36.0–46.0)
HEMOGLOBIN: 8.4 g/dL — AB (ref 12.0–15.0)
Hemoglobin: 8.7 g/dL — ABNORMAL LOW (ref 12.0–15.0)
Hemoglobin: 8.4 g/dL — ABNORMAL LOW (ref 12.0–15.0)
Hemoglobin: 8.3 g/dL — ABNORMAL LOW (ref 12.0–15.0)
MCH: 29.9 pg (ref 26.0–34.0)
MCH: 29.5 pg (ref 26.0–34.0)
MCH: 30.4 pg (ref 26.0–34.0)
MCH: 30.3 pg (ref 26.0–34.0)
MCHC: 35.3 g/dL (ref 30.0–36.0)
MCHC: 34.8 g/dL (ref 30.0–36.0)
MCHC: 35.6 g/dL (ref 30.0–36.0)
MCHC: 35.2 g/dL (ref 30.0–36.0)
MCV: 84.7 fL (ref 78.0–100.0)
MCV: 84.7 fL (ref 78.0–100.0)
MCV: 85.5 fL (ref 78.0–100.0)
MCV: 86.1 fL (ref 78.0–100.0)
PLATELETS: 296 10*3/uL (ref 150–400)
PLATELETS: 281 10*3/uL (ref 150–400)
Platelets: 308 10*3/uL (ref 150–400)
Platelets: 327 10*3/uL (ref 150–400)
RBC: 2.81 MIL/uL — ABNORMAL LOW (ref 3.87–5.11)
RBC: 2.95 MIL/uL — ABNORMAL LOW (ref 3.87–5.11)
RBC: 2.76 MIL/uL — ABNORMAL LOW (ref 3.87–5.11)
RBC: 2.74 MIL/uL — ABNORMAL LOW (ref 3.87–5.11)
RDW: 18.8 % — AB (ref 11.5–15.5)
RDW: 18.6 % — AB (ref 11.5–15.5)
RDW: 19 % — ABNORMAL HIGH (ref 11.5–15.5)
RDW: 19 % — AB (ref 11.5–15.5)
WBC: 15.1 10*3/uL — ABNORMAL HIGH (ref 4.0–10.5)
WBC: 16.2 10*3/uL — ABNORMAL HIGH (ref 4.0–10.5)
WBC: 15.1 10*3/uL — ABNORMAL HIGH (ref 4.0–10.5)
WBC: 13.6 10*3/uL — AB (ref 4.0–10.5)

## 2013-11-09 LAB — POCT I-STAT 3, ART BLOOD GAS (G3+)
Acid-Base Excess: 1 mmol/L (ref 0.0–2.0)
Bicarbonate: 26.6 mEq/L — ABNORMAL HIGH (ref 20.0–24.0)
O2 Saturation: 100 %
PCO2 ART: 46.2 mmHg — AB (ref 35.0–45.0)
PO2 ART: 230 mmHg — AB (ref 80.0–100.0)
Patient temperature: 97
TCO2: 28 mmol/L (ref 0–100)
pH, Arterial: 7.365 (ref 7.350–7.450)

## 2013-11-09 LAB — BASIC METABOLIC PANEL
BUN: 17 mg/dL (ref 6–23)
CALCIUM: 8.3 mg/dL — AB (ref 8.4–10.5)
CHLORIDE: 102 meq/L (ref 96–112)
CO2: 25 meq/L (ref 19–32)
Creatinine, Ser: 0.99 mg/dL (ref 0.50–1.10)
GFR calc Af Amer: 69 mL/min — ABNORMAL LOW (ref >90)
GFR calc non Af Amer: 60 mL/min — ABNORMAL LOW (ref >90)
GLUCOSE: 103 mg/dL — AB (ref 70–99)
Potassium: 3.1 mEq/L — ABNORMAL LOW (ref 3.7–5.3)
Sodium: 138 mEq/L (ref 137–147)

## 2013-11-09 LAB — TYPE AND SCREEN
ABO/RH(D): B POS
Antibody Screen: NEGATIVE

## 2013-11-09 LAB — PROCALCITONIN: Procalcitonin: 6.94 ng/mL

## 2013-11-09 LAB — OCCULT BLOOD GASTRIC / DUODENUM (SPECIMEN CUP): OCCULT BLOOD, GASTRIC: POSITIVE — AB

## 2013-11-09 LAB — TROPONIN I: Troponin I: <0.3 ng/mL (ref <0.30)

## 2013-11-09 LAB — HEPARIN LEVEL (UNFRACTIONATED): Heparin Unfractionated: 0.58 IU/mL (ref 0.30–0.70)

## 2013-11-09 MED ORDER — POTASSIUM CHLORIDE 10 MEQ/50ML IV SOLN
10.0000 meq | INTRAVENOUS | Status: AC
  Administered 2013-11-09 (×6): 10 meq via INTRAVENOUS
  Filled 2013-11-09 (×6): qty 50

## 2013-11-09 MED ORDER — MIDAZOLAM HCL 5 MG/ML IJ SOLN
1.0000 mg/h | INTRAMUSCULAR | Status: DC
  Administered 2013-11-09: 2 mg/h via INTRAVENOUS
  Administered 2013-11-09 – 2013-11-10 (×2): 5 mg/h via INTRAVENOUS
  Filled 2013-11-09 (×4): qty 10

## 2013-11-09 MED ORDER — MIDAZOLAM HCL 2 MG/2ML IJ SOLN
2.0000 mg | Freq: Once | INTRAMUSCULAR | Status: AC
  Administered 2013-11-09: 2 mg via INTRAVENOUS

## 2013-11-09 MED ORDER — FENTANYL CITRATE 0.05 MG/ML IJ SOLN
100.0000 ug | Freq: Once | INTRAMUSCULAR | Status: AC
  Administered 2013-11-09: 100 ug via INTRAVENOUS

## 2013-11-09 MED ORDER — POTASSIUM CHLORIDE CRYS ER 20 MEQ PO TBCR: 30.0000 meq | EXTENDED_RELEASE_TABLET | ORAL | Status: DC

## 2013-11-09 MED ORDER — PHENYLEPHRINE HCL 10 MG/ML IJ SOLN
30.0000 ug/min | INTRAMUSCULAR | Status: DC
  Filled 2013-11-09: qty 1

## 2013-11-09 MED ORDER — ROCURONIUM BROMIDE 50 MG/5ML IV SOLN
50.0000 mg | Freq: Once | INTRAVENOUS | Status: AC
  Administered 2013-11-09: 50 mg via INTRAVENOUS

## 2013-11-09 MED ORDER — BIOTENE DRY MOUTH MT LIQD
1.0000 "application " | Freq: Four times a day (QID) | OROMUCOSAL | Status: DC
  Administered 2013-11-09 – 2013-11-13 (×17): 15 mL via OROMUCOSAL

## 2013-11-09 MED ORDER — PANTOPRAZOLE SODIUM 40 MG IV SOLR
40.0000 mg | Freq: Two times a day (BID) | INTRAVENOUS | Status: DC
  Administered 2013-11-09 – 2013-11-14 (×11): 40 mg via INTRAVENOUS
  Filled 2013-11-09 (×15): qty 40

## 2013-11-09 MED ORDER — SODIUM CHLORIDE 0.9 % IV SOLN
25.0000 ug/h | INTRAVENOUS | Status: DC
  Administered 2013-11-09: 100 ug/h via INTRAVENOUS
  Administered 2013-11-10: 150 ug/h via INTRAVENOUS
  Filled 2013-11-09 (×2): qty 50

## 2013-11-09 MED ORDER — ETOMIDATE 2 MG/ML IV SOLN
20.0000 mg | Freq: Once | INTRAVENOUS | Status: AC
  Administered 2013-11-09: 20 mg via INTRAVENOUS

## 2013-11-09 MED ORDER — MIDAZOLAM HCL 2 MG/2ML IJ SOLN
INTRAMUSCULAR | Status: AC
  Administered 2013-11-09: 2 mg via INTRAVENOUS
  Filled 2013-11-09: qty 2

## 2013-11-09 MED ORDER — CHLORHEXIDINE GLUCONATE 0.12 % MT SOLN
15.0000 mL | Freq: Two times a day (BID) | OROMUCOSAL | Status: DC
  Administered 2013-11-09 – 2013-11-13 (×9): 15 mL via OROMUCOSAL
  Filled 2013-11-09 (×9): qty 15

## 2013-11-09 MED ORDER — FENTANYL CITRATE 0.05 MG/ML IJ SOLN
INTRAMUSCULAR | Status: AC
  Administered 2013-11-09: 100 ug via INTRAVENOUS
  Filled 2013-11-09: qty 2

## 2013-11-09 NOTE — Progress Notes (Signed)
  Echocardiogram 2D Echocardiogram has been performed.  [name] 11/09/2013, 9:22 AM

## 2013-11-09 NOTE — Progress Notes (Signed)
ANTICOAGULATION CONSULT NOTE - Follow Up Consult  Pharmacy Consult for heparin Indication: chest pain/ACS  Labs:  Recent Labs  2013/11/30 1512  November 30, 2013 2227 11/09/13 0014 11/09/13 0500 11/09/13 0510  HGB 9.1*  --   --   --  8.4*  --   HCT 25.8*  --   --   --  23.8*  --   PLT 323  --   --   --  308  --   HEPARINUNFRC  --   --   --   --  0.58  --   CREATININE 1.04  --   --   --  0.99  --   TROPONINI  --   < > <0.30 <0.30  --  <0.30  < > = values in this interval not displayed.   Assessment/Plan:  63yo female therapeutic on heparin with initial dosing for CP.  Will continue gtt at current rate and confirm stable with additional level.  Wynona Neat, PharmD, BCPS  11/09/2013,7:38 AM

## 2013-11-09 NOTE — Progress Notes (Signed)
[facility] ADULT ICU REPLACEMENT PROTOCOL FOR AM LAB REPLACEMENT ONLY  The patient does apply for the [facility] Adult ICU Electrolyte Replacment Protocol based on the criteria listed below:   1. Is GFR >/= 40 ml/min? yes  Patient's GFR today is 69 2. Is urine output >/= 0.5 ml/kg/hr for the last 6 hours? yes Patient's UOP is 0.7 ml/kg/hr 3. Is BUN < 60 mg/dL? yes  Patient's BUN today is 17 4. Abnormal electrolyte(s):K 3.1 5. Ordered repletion with: protocol 6. If a panic level lab has been reported, has the CCM MD in charge been notified? yes.   Physician:  Hulan Amato [facility] 11/09/2013 6:04 AM

## 2013-11-09 NOTE — Progress Notes (Signed)
Patient vomited while on BIPAP.  No aspiration suspected as patient is alert and oriented and was able to notify staff and effectively clear airway.  Patient placed on 60% venti mask.  Patient reports no SOB.  RN at bedside.

## 2013-11-09 NOTE — Procedures (Signed)
Name: [name] MRN: [id number] DOB: [birth year]   PROCEDURE NOTE  Procedure:  Endotracheal intubation.  Indication:  Acute respiratory failure  Consent:  Consent was implied due to the emergency nature of the procedure.  Anesthesia:  A total of 10 mg of Etomidate and 50 mg of Rocuronium was given intravenously  Procedure summary:  Appropriate equipment was assembled. The patient was identified as [name] and safety timeout was performed. The patient was placed supine, with head in sniffing position. After adequate level of anesthesia was achieved, a GS#3 blade was inserted into the oropharynx and the vocal cords were visualized. A 7.5 endotracheal tube was inserted without difficulty and visualized going through the vocal cords. The stylette was removed and cuff inflated. Colorimetric change was noted on the CO2 meter. Breath sounds were heard over both lung fields equally. ETT was secured at 22 cm lip line.  Post procedure chest xray was ordered.  Complications:  No immediate complications were noted.  Hemodynamic parameters and oxygenation remained stable throughout the procedure.    Penne Lash, M.D. Pulmonary and [place] Pager: [phone]  11/09/2013, 8:45 AM
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
## 2013-11-09 NOTE — ED Provider Notes (Signed)
Medical screening examination/treatment/procedure(s) were conducted as a shared visit with non-physician practitioner(s) or resident and myself. I personally evaluated the patient during the encounter and agree with the findings and plan unless otherwise indicated.  I have personally reviewed any xrays and/ or EKG's with the provider and I agree with interpretation.  Hx of lipids, gerd, recent hospitalization for C diff/ colitis presents with worsening dyspnea and intermittent chest pressure since yesterday. No hx of cardiac or CHF. Worse with lying flat, pt has been in rehab/NH so minimal exertion since hospitalization. Started on po abx yesterday for possible pneumonia, worsening since. Leg swelling worse.  Pt on vanco po. Exam crackles at bases, mild tachypnea, bipap in place, 2+ leg edema bilateral, NSR, mild distress.  Pt required bipap on route for ems. Clinically CHF new onset vs HCAP vs NSTEMI vs other. Lasix dose in ED.  Plan for step down or ICU. CRITICAL CARE Performed by: Mariea Clonts  Total critical care time: 35 min  Critical care time was exclusive of separately billable procedures and treating other patients.  Critical care was necessary to treat or prevent imminent or life-threatening deterioration.  Critical care was time spent personally by me on the following activities: development of treatment plan with patient and/or surrogate as well as nursing, discussions with consultants, evaluation of patient's response to treatment, examination of patient, obtaining history from patient or surrogate, ordering and performing treatments and interventions, ordering and review of laboratory studies, ordering and review of radiographic studies, pulse oximetry and re-evaluation of patient's condition.  EMERGENCY DEPARTMENT [place] CARDIAC EXAM  "Study: Limited Ultrasound of the heart and pericardium"  INDICATIONS:Dyspnea  Multiple views of the heart and pericardium were obtained in real-time with  a multi-frequency probe.  PERFORMED RU:[id number]  IMAGES ARCHIVED?: Yes  FINDINGS: No pericardial effusion and Decreased contractility  LIMITATIONS: Body habitus  VIEWS USED: Parasternal long axis, Parasternal short axis and Inferior Vena Cava  INTERPRETATION: Cardiac activity present, Pericardial effusioin absent, Cardiac tamponade absent and Decreased contractility mild decr overall EF  Medical screening examination/treatment/procedure(s) were performed by non-physician practitioner and as supervising physician I was immediately available for consultation/collaboration.   EKG Interpretation   Date/Time:  Sunday November 08 2013 14:57:17 EST Ventricular Rate:  101 PR Interval:  155 QRS Duration: 90 QT Interval:  493 QTC Calculation: 639 R Axis:   20 Text Interpretation:  Sinus tachycardia Atrial premature complex  Nonspecific T abnrm, anterolateral leads Prolonged QT interval Confirmed  by Elloise Roark  MD, Addie Cederberg ([id number]) on 11/12/2013 3:07:59 PM      Repeat EKG normal QT.  Date: 11/09/2013  Rate: 92  Rhythm: normal sinus rhythm  QRS Axis: indeterminate  Intervals: QT prolonged  ST/T Wave abnormalities: nonspecific ST changes  Conduction Disutrbances:none  Narrative Interpretation:   Old EKG Reviewed: changes noted    HCAP, Acute pulmonary edema, Acute Resp distress     Mariea Clonts, MD 11/09/13 [phone]

## 2013-11-09 NOTE — H&P (Signed)
PULMONARY / CRITICAL CARE MEDICINE  Name: [name] MRN: [id number] DOB: 1951-07-05    ADMISSION DATE:  12/03/2013 CONSULTATION DATE:  11/11/2013  REFERRING MD :  EDP PRIMARY SERVICE:  PCCM  CHIEF COMPLAINT:  Respiratory failure  BRIEF PATIENT DESCRIPTION: 63 year old obese female with history of HTN, GERD, depression, obesity and lumbar spondylosis status post lumbar decompression surgery in January 2015.  Just discharged 11/04/13 after treatment for c.diff colitis and salmonella enterocolitis. Hospitalization complicated by encephalopathy and acute renal failure (peak creatinine 3.03). Admitted 2/25 with respiratory failure though due to combination PNA and HF.  LINES / TUBES: Foley  3/1 >>> R IJ TLC 3/1 >>> OETT  3/2 >>> NFT  3/2 >>>  CULTURES: 3/1 Blood >>> 3/1 Urine >>> 3/2 Respiratory >>>  ANTIBIOTICS: Zosyn 2/25>>> Vancomycin 2/25 >>>  INTERVAL HISTORY:  Coffee ground emesis wile on BiPAP >>> possible aspirated >>> BiPAP d/c'd.  Respiratory distress and weak cough this am.  VITAL SIGNS: Temp:  [97.5 F (36.4 C)-97.8 F (36.6 C)] 97.5 F (36.4 C) (03/02 0600) Pulse Rate:  [83-101] 101 (03/02 0600) Resp:  [20-47] 29 (03/02 0600) BP: (76-138)/(22-117) 107/48 mmHg (03/02 0600) SpO2:  [92 %-100 %] 92 % (03/02 0600) FiO2 (%):  [60 %-100 %] 60 % (03/02 0351) Weight:  [111.3 kg (245 lb 6 oz)-113.399 kg (250 lb)] 111.3 kg (245 lb 6 oz) (03/02 0500)  EMODYNAMICS: CVP:  [7 mmHg] 7 mmHg  VENTILATOR SETTINGS: Vent Mode:  [-] BIPAP FiO2 (%):  [60 %-100 %] 60 % Set Rate:  [15 bmp] 15 bmp PEEP:  [5 cmH20] 5 cmH20 Plateau Pressure:  [2 cmH20] 2 cmH20  INTAKE / OUTPUT: Intake/Output     03/01 0701 - 03/02 0700 03/02 0701 - 03/03 0700   I.V. (mL/kg) 105.7 (0.9)    IV Piggyback 50    Total Intake(mL/kg) 155.7 (1.4)    Urine (mL/kg/hr) 2230    Total Output 2230     Net -2074.3            PHYSICAL EXAMINATION: General:  Increased work of breathing  Neuro:  Awake,  but appears tired, non focal HEENT:  Coffee ground material in oropharynx Neck: JVD Cardiovascular:  Distant regular hard to hear over lung sounds Lungs:  Bilateral air entry, coarse rhonchi throughout Abdomen:  Soft, non tender, bowel sounds diminished  Musculoskeletal:  Moves all extremities, pitting edema bilaterally Skin:  Intact  LABS: CBC  Recent Labs Lab 11/03/13 0445 11/27/2013 1512 11/09/13 0500  WBC 14.8* 15.8* 15.1*  HGB 9.6* 9.1* 8.4*  HCT 27.8* 25.8* 23.8*  PLT 204 323 308   Coag's No results found for this basename: APTT, INR,  in the last 168 hours  BMET  Recent Labs Lab 11/04/13 0425 11/13/2013 1512 11/09/13 0500  NA 141 139 138  K 2.9* 3.2* 3.1*  CL 108 102 102  CO2 20 20 25   BUN 18 18 17   CREATININE 0.92 1.04 0.99  GLUCOSE 104* 131* 103*   Electrolytes  Recent Labs Lab 11/04/13 0425 11/29/2013 1512 11/09/13 0500  CALCIUM 7.8* 8.6 8.3*  MG 1.3* 1.7  --    Sepsis Markers  Recent Labs Lab 11/17/2013 1523 11/22/2013 1944 11/29/2013 2228 11/09/13 0500 11/09/13 0514  LATICACIDVEN 2.39* 1.6 0.7  --  0.7  PROCALCITON  --  7.72  --  6.94  --    ABG  Recent Labs Lab 11/12/2013 1538  PHART 7.470*  PCO2ART 34.0*  PO2ART 336.0*   Liver Enzymes  Recent Labs Lab 11/10/2013 1512  AST 29  ALT 29  ALKPHOS 89  BILITOT 0.3  ALBUMIN 2.3*   Cardiac Enzymes  Recent Labs Lab 12/03/2013 1512  11/21/2013 2227 11/09/13 0014 11/09/13 0510  TROPONINI  --   < > <0.30 <0.30 <0.30  PROBNP 1572.0*  --   --   --   --   < > = values in this interval not displayed. Glucose  Recent Labs Lab 11/16/2013 1858  GLUCAP 112*   IMAGING:  Dg Chest Port 1 View  11/28/2013   CLINICAL DATA:  Check central line position.  EXAM: PORTABLE CHEST - 1 VIEW  COMPARISON:  Chest x-ray from the same day at 3:35 p.m.  FINDINGS: New right IJ central line, tip at the level of the upper right atrium. There is no evidence of pneumothorax.  Stable cardiomegaly. No change in upper  mediastinal contours. Unchanged lung aeration with patchy bilateral interstitial and airspace disease. No evidence of pleural effusion.  IMPRESSION: New right IJ catheter, tip at the upper right atrium. No pneumothorax.   Electronically Signed   By: Jorje Guild M.D.   On: 12/06/2013 23:29   Dg Chest Portable 1 View  12/01/2013   CLINICAL DATA:  Shortness of breath and hypertension.  EXAM: PORTABLE CHEST - 1 VIEW  COMPARISON:  10/28/2013 and 10/27/2013  FINDINGS: Lungs are hypoinflated with mild worsening of prominence of the perihilar markings. There is increased density in the left retrocardiac region. No definite effusion. Cardiomediastinal silhouette and remainder of the exam is unchanged.  IMPRESSION: Worsening opacification in the left retrocardiac region which may be due to atelectasis versus infection.  Mild prominence of the perihilar markings suggesting vascular congestion.   Electronically Signed   By: Marin Olp M.D.   On: 11/10/2013 15:44   ASSESSMENT / PLAN:  PULMONARY A:   Acute respiratory failure, suspect combination PNA and acute HF Now possible aspiration / unable to protect airway P:   BiPAP contraindicated Intubate after NG tube placed and gastric content aspirated Goal pH>7.30, SpO2>92 Continuous mechanical support VAP bundle Daily SBT Trend ABG/CXR  CARDIOVASCULAR A:  Chest pain on admission but serial troponin negative Suspect acute systolic CHF SIRS / sepsis Borderline hypotensive and may be exacerbated by procedural sedation P:  Goal MAP > 65 Start Neo-Synephrine D/c Heparin as suspect acute upper GI hemorrhage TTE ASA intolerance BB / ACEI contraindicated  Trend lactate   RENAL A:   Hypokalemia P:   VCP q4h Trend BMP K 10 x 6 Hold diuresis while hypotensive  GASTROINTESTINAL A:   Recent c.diff colitis Suspected upper GI hemorrhage Nausea / Vomiting P:   Has finished C.diff therapy NPO  Change Protonix to q12h NGT to  Sx KUB  HEM A: Anemia, acute on chronic VTE Px P: Trend CBC q6h Type and screen SCDs  INFECTIOUS A:   Probable PNA UTI P:   Cultures and antibiotics as above PCT   NEURO A: Sedation for comfort / ventilator synchrony P: Goal RASS 0 to -1 Fentanyl / Versed gtt  I have personally obtained history, examined patient, evaluated and interpreted laboratory and imaging results, reviewed medical records, formulated assessment / plan and placed orders.  CRITICAL CARE:  The patient is critically ill with multiple organ systems failure and requires high complexity decision making for assessment and support, frequent evaluation and titration of therapies, application of advanced monitoring technologies and extensive interpretation of multiple databases. Critical Care Time devoted to patient care services described  in this note is 60 minutes.   Doree Fudge, MD Pulmonary and [place] Pager: [phone]  11/09/2013, 8:08 AM

## 2013-11-10 ENCOUNTER — Inpatient Hospital Stay (HOSPITAL_COMMUNITY): Payer: Medicare Other

## 2013-11-10 DIAGNOSIS — N179 Acute kidney failure, unspecified: Secondary | ICD-10-CM

## 2013-11-10 LAB — CBC
HCT: 23 % — ABNORMAL LOW (ref 36.0–46.0)
HEMATOCRIT: 24.3 % — AB (ref 36.0–46.0)
HEMATOCRIT: 26.4 % — AB (ref 36.0–46.0)
HEMOGLOBIN: 8.1 g/dL — AB (ref 12.0–15.0)
HEMOGLOBIN: 8.5 g/dL — AB (ref 12.0–15.0)
HEMOGLOBIN: 9 g/dL — AB (ref 12.0–15.0)
MCH: 30 pg (ref 26.0–34.0)
MCH: 30.4 pg (ref 26.0–34.0)
MCH: 29.5 pg (ref 26.0–34.0)
MCHC: 35.2 g/dL (ref 30.0–36.0)
MCHC: 35 g/dL (ref 30.0–36.0)
MCHC: 34.1 g/dL (ref 30.0–36.0)
MCV: 85.2 fL (ref 78.0–100.0)
MCV: 86.8 fL (ref 78.0–100.0)
MCV: 86.6 fL (ref 78.0–100.0)
Platelets: 256 10*3/uL (ref 150–400)
Platelets: 278 10*3/uL (ref 150–400)
Platelets: 286 10*3/uL (ref 150–400)
RBC: 2.7 MIL/uL — AB (ref 3.87–5.11)
RBC: 2.8 MIL/uL — AB (ref 3.87–5.11)
RBC: 3.05 MIL/uL — ABNORMAL LOW (ref 3.87–5.11)
RDW: 18.8 % — ABNORMAL HIGH (ref 11.5–15.5)
RDW: 18.8 % — ABNORMAL HIGH (ref 11.5–15.5)
RDW: 19 % — ABNORMAL HIGH (ref 11.5–15.5)
WBC: 10.3 10*3/uL (ref 4.0–10.5)
WBC: 9.5 10*3/uL (ref 4.0–10.5)
WBC: 9.4 10*3/uL (ref 4.0–10.5)

## 2013-11-10 LAB — URINE CULTURE
CULTURE: NO GROWTH
Colony Count: NO GROWTH

## 2013-11-10 LAB — BASIC METABOLIC PANEL
BUN: 16 mg/dL (ref 6–23)
CHLORIDE: 107 meq/L (ref 96–112)
CO2: 24 mEq/L (ref 19–32)
Calcium: 8.4 mg/dL (ref 8.4–10.5)
Creatinine, Ser: 1.04 mg/dL (ref 0.50–1.10)
GFR calc Af Amer: 65 mL/min — ABNORMAL LOW (ref >90)
GFR calc non Af Amer: 56 mL/min — ABNORMAL LOW (ref >90)
GLUCOSE: 88 mg/dL (ref 70–99)
POTASSIUM: 3.7 meq/L (ref 3.7–5.3)
SODIUM: 145 meq/L (ref 137–147)

## 2013-11-10 LAB — PROCALCITONIN: PROCALCITONIN: 4.86 ng/mL

## 2013-11-10 MED ORDER — DEXMEDETOMIDINE HCL IN NACL 200 MCG/50ML IV SOLN
0.4000 ug/kg/h | INTRAVENOUS | Status: DC
  Administered 2013-11-10: 0.335 ug/kg/h via INTRAVENOUS
  Administered 2013-11-10: 0.5 ug/kg/h via INTRAVENOUS
  Administered 2013-11-10: 0.335 ug/kg/h via INTRAVENOUS
  Administered 2013-11-11: 0.4 ug/kg/h via INTRAVENOUS
  Administered 2013-11-11: 0.335 ug/kg/h via INTRAVENOUS
  Administered 2013-11-11: 0.402 ug/kg/h via INTRAVENOUS
  Filled 2013-11-10 (×6): qty 50

## 2013-11-10 MED ORDER — FENTANYL CITRATE 0.05 MG/ML IJ SOLN
50.0000 ug | INTRAMUSCULAR | Status: DC | PRN
  Administered 2013-11-11 (×2): 50 ug via INTRAVENOUS
  Filled 2013-11-10 (×2): qty 2

## 2013-11-10 MED ORDER — POTASSIUM CHLORIDE 10 MEQ/50ML IV SOLN
10.0000 meq | INTRAVENOUS | Status: AC
  Administered 2013-11-10 (×4): 10 meq via INTRAVENOUS
  Filled 2013-11-10 (×4): qty 50

## 2013-11-10 MED ORDER — FUROSEMIDE 10 MG/ML IJ SOLN
40.0000 mg | Freq: Once | INTRAMUSCULAR | Status: AC
  Administered 2013-11-10: 40 mg via INTRAVENOUS
  Filled 2013-11-10: qty 4

## 2013-11-10 NOTE — Clinical Social Work Psychosocial (Signed)
Clinical Social Work Department BRIEF PSYCHOSOCIAL ASSESSMENT 11/10/2013  Patient:  [name], [name]     Account Number:  [id number]     Admit date:  November 15, 2013  Clinical Social Worker:  Wylene Men  Date/Time:  11/10/2013 02:05 PM  Referred by:  Physician  Date Referred:  11/10/2013 Referred for  SNF Placement   Other Referral:   readmission   Interview type:  Other - See comment Other interview type:   daughter, [name]    PSYCHOSOCIAL DATA Living Status:  FACILITY Admitted from facility:  [facility] Level of care:  [place] Primary support name:  [name] Primary support relationship to patient:  CHILD, ADULT Degree of support available:   strong    CURRENT CONCERNS Current Concerns  Post-Acute Placement   Other Concerns:   none    SOCIAL WORK ASSESSMENT / PLAN Pt is on the vent and sedated.  CSW spoke with pt daughter, [name].  [name] reports that pt underwent back surgery on 09/18/2013 at [facility] and was dc to [facility], SNF for 20 days.  From there she was dc home.  Pt was home approx 1 week when she developed c-diff and was admitted to [facility].  From [facility] she was dc back to [facility].  Pt has been at [facility] for only 5 days when she was readmitted here to [facility] with pneumonia and CHF.    Daughter states that she is "holding up pretty well" though she is very tired.  Daughter states that the past two months have been hard on her running back and forth for her mom and it appears that her health is "going down hill". CSW offered emotional support and referred pt to [facility] for caregiver support groups.  Daughter was thankful.    Daughter states that she has been "some-what pleased" with [facility].  The only complaint is that the facility is "not quick to act when things are off".    Daughter is agreeable for pt to return to [facility] to complete STR once pt is ready for dc.  CSW will continue to  follow and assist with disposition and support.   Assessment/plan status:  Psychosocial Support/Ongoing Assessment of Needs Other assessment/ plan:   Pt to return to [facility] once medically stable.   Information/referral to community resources:   [facility]  SNF/STR    PATIENT'S/FAMILY'S RESPONSE TO PLAN OF CARE: Daughter was agreeable to STR/SNF and was appreciative of CSW support and assistance.       Nonnie Done, [place] [phone]  Clinical Social Work

## 2013-11-10 NOTE — Progress Notes (Signed)
115cc of IV Fentanyl wasted in the sink with Ria Comment, Therapist, sports.

## 2013-11-10 NOTE — Progress Notes (Signed)
Respiratory therapy note- Patient fwith increased RR and VE, placed back to full support.

## 2013-11-10 NOTE — Progress Notes (Signed)
PULMONARY / CRITICAL CARE MEDICINE  Name: [name] MRN: [id number] DOB: [birth year]    ADMISSION DATE:  12-05-13 CONSULTATION DATE:  2013/12/05  REFERRING MD :  EDP PRIMARY SERVICE:  PCCM  CHIEF COMPLAINT:  Respiratory failure  BRIEF PATIENT DESCRIPTION: 63 year old obese female with history of HTN, GERD, depression, obesity and lumbar spondylosis status post lumbar decompression surgery in January 2015.  Just discharged 11/04/13 after treatment for c.diff colitis and salmonella enterocolitis. Hospitalization complicated by encephalopathy and acute renal failure (peak creatinine 3.03). Admitted 2/25 with respiratory failure though due to combination PNA and HF.  EVENTS / STUDIES: 3/2  TTE >>> EF 60, no RWMA  LINES / TUBES: Foley  3/1 >>> R IJ TLC 3/1 >>> OETT  3/2 >>> NGT  3/2 >>>  CULTURES: 3/1  MRSA PCR >>> neg 3/1  Blood >>> 3/1  Urine >>> neg  ANTIBIOTICS: Zosyn 2/25>>> Vancomycin 2/25 >>>  INTERVAL HISTORY:  Resolved coffee ground via NGT.  Off vasopressors.  VITAL SIGNS: Temp:  [96.3 F (35.7 C)-98 F (36.7 C)] 98 F (36.7 C) (03/03 0900) Pulse Rate:  [86-96] 95 (03/03 0900) Resp:  [16-32] 18 (03/03 0900) BP: (94-129)/(47-73) 105/70 mmHg (03/03 0900) SpO2:  [100 %] 100 % (03/03 0900) FiO2 (%):  [40 %-60 %] 40 % (03/03 0735) Weight:  [119.5 kg (263 lb 7.2 oz)] 119.5 kg (263 lb 7.2 oz) (03/03 0500)  EMODYNAMICS: CVP:  [5 mmHg-9 mmHg] 9 mmHg  VENTILATOR SETTINGS: Vent Mode:  [-] PRVC FiO2 (%):  [40 %-60 %] 40 % Set Rate:  [16 bmp] 16 bmp Vt Set:  [420 mL] 420 mL PEEP:  [5 cmH20] 5 cmH20 Plateau Pressure:  [19 cmH20-24 cmH20] 22 cmH20  INTAKE / OUTPUT: Intake/Output     03/02 0701 - 03/03 0700 03/03 0701 - 03/04 0700   I.V. (mL/kg) 400.5 (3.4)    IV Piggyback 1100    Total Intake(mL/kg) 1500.5 (12.6)    Urine (mL/kg/hr) 1150 (0.4)    Emesis/NG output 300 (0.1)    Total Output 1450     Net +50.5          Emesis Occurrence 3 x      PHYSICAL  EXAMINATION: General:  Resting comfortable, no distress Neuro:  Well sedated, cough / gag dimonished HEENT:  OETT / NGT Neck: No JVD Cardiovascular:  Regular, no murmurs Lungs:  Bilateral air entry, no added sounds Abdomen:  Soft, non tender, bowel sounds diminished  Musculoskeletal:  Moves all extremities, pitting edema bilaterally Skin:  Intact  LABS: CBC  Recent Labs Lab 11/09/13 2018 11/10/13 0212 11/10/13 0844  WBC 13.6* 10.3 9.5  HGB 8.3* 8.1* 8.5*  HCT 23.6* 23.0* 24.3*  PLT 281 256 278   Coag's No results found for this basename: APTT, INR,  in the last 168 hours  BMET  Recent Labs Lab Dec 05, 2013 1512 11/09/13 0500 11/10/13 0419  NA 139 138 145  K 3.2* 3.1* 3.7  CL 102 102 107  CO2 20 25 24   BUN 18 17 16   CREATININE 1.04 0.99 1.04  GLUCOSE 131* 103* 88   Electrolytes  Recent Labs Lab 11/04/13 0425 December 05, 2013 1512 11/09/13 0500 11/10/13 0419  CALCIUM 7.8* 8.6 8.3* 8.4  MG 1.3* 1.7  --   --    Sepsis Markers  Recent Labs Lab 12-05-13 1944  11/09/13 0500  11/09/13 0829 11/09/13 1429 11/09/13 2018 11/10/13 0419  LATICACIDVEN 1.6  < >  --   < > 0.8 0.7 0.9  --  PROCALCITON 7.72  --  6.94  --   --   --   --  4.86  < > = values in this interval not displayed. ABG  Recent Labs Lab 11/24/2013 1538 11/09/13 1006  PHART 7.470* 7.365  PCO2ART 34.0* 46.2*  PO2ART 336.0* 230.0*   Liver Enzymes  Recent Labs Lab 11/09/2013 1512  AST 29  ALT 29  ALKPHOS 89  BILITOT 0.3  ALBUMIN 2.3*   Cardiac Enzymes  Recent Labs Lab 11/13/2013 1512  12/05/2013 2227 11/09/13 0014 11/09/13 0510  TROPONINI  --   < > <0.30 <0.30 <0.30  PROBNP 1572.0*  --   --   --   --   < > = values in this interval not displayed. Glucose  Recent Labs Lab 12/05/2013 1858  GLUCAP 112*   IMAGING:  Dg Chest Port 1 View  11/10/2013   CLINICAL DATA:  Endotracheal tube position  EXAM: PORTABLE CHEST - 1 VIEW  COMPARISON:  11/09/2013  FINDINGS: Endotracheal tube is 10 mm  above the carina. Right jugular catheter tip in the right atrium. NG tube in the stomach with the tip extending back to the GE junction. This is unchanged.  Increase in bibasilar atelectasis and small effusions. Increased vascular congestion and edema suggesting fluid overload.  IMPRESSION: Endotracheal tube is 10 mm above the carina.  Worsening heart failure and edema. Increased bibasilar atelectasis and bilateral effusions.   Electronically Signed   By: Franchot Gallo M.D.   On: 11/10/2013 07:24   Dg Chest Port 1 View  11/09/2013   CLINICAL DATA:  Hypoxia  EXAM: PORTABLE CHEST - 1 VIEW  COMPARISON:  November 08, 2013  FINDINGS: Endotracheal tube tip is 2.2 cm above the carina. Central catheter tip is in the right atrium. There is a nasogastric tube with tip and side-port in the stomach. No pneumothorax.  There is left lower lobe consolidation which was present 1 day prior. There is new consolidation in the right upper lobe. Heart is upper normal in size with normal pulmonary vascularity. No adenopathy.  IMPRESSION: Tube and catheter positions as described. No pneumothorax. Note that the endotracheal tube may warrant withdrawal of 1-2 cm. There is consolidation in the right upper lobe and left lower lobe with a right upper lobe consolidation new.   Electronically Signed   By: Lowella Grip M.D.   On: 11/09/2013 09:51   Dg Chest Port 1 View  11/16/2013   CLINICAL DATA:  Check central line position.  EXAM: PORTABLE CHEST - 1 VIEW  COMPARISON:  Chest x-ray from the same day at 3:35 p.m.  FINDINGS: New right IJ central line, tip at the level of the upper right atrium. There is no evidence of pneumothorax.  Stable cardiomegaly. No change in upper mediastinal contours. Unchanged lung aeration with patchy bilateral interstitial and airspace disease. No evidence of pleural effusion.  IMPRESSION: New right IJ catheter, tip at the upper right atrium. No pneumothorax.   Electronically Signed   By: Jorje Guild M.D.    On: 12/05/2013 23:29   Dg Chest Portable 1 View  11/15/2013   CLINICAL DATA:  Shortness of breath and hypertension.  EXAM: PORTABLE CHEST - 1 VIEW  COMPARISON:  10/28/2013 and 10/27/2013  FINDINGS: Lungs are hypoinflated with mild worsening of prominence of the perihilar markings. There is increased density in the left retrocardiac region. No definite effusion. Cardiomediastinal silhouette and remainder of the exam is unchanged.  IMPRESSION: Worsening opacification in the left retrocardiac region which  may be due to atelectasis versus infection.  Mild prominence of the perihilar markings suggesting vascular congestion.   Electronically Signed   By: Marin Olp M.D.   On: 12/05/2013 15:44   Dg Abd Portable 1v  11/09/2013   CLINICAL DATA:  Small bowel obstruction  EXAM: PORTABLE ABDOMEN - 1 VIEW  COMPARISON:  DG ABD PORTABLE 2V dated 10/27/2013; CT ABD/PELV WO CM dated 10/26/2013  FINDINGS: No disproportionate dilatation of bowel. NG tube is coiled in the stomach. The tip is in the fundus. Gas-filled loops of colon project over the lower abdomen. There is suspected wall thickening in the cecum. No pneumatosis. No obvious free intraperitoneal gas.  IMPRESSION: Nonobstructive bowel gas pattern.  Colonic wall thickening is suspected.   Electronically Signed   By: Maryclare Bean M.D.   On: 11/09/2013 10:02   ASSESSMENT / PLAN:  PULMONARY A:   Acute respiratory failure, suspect combination PNA and acute HF Possible aspiration pneumonia ARDS / pulmonary edema vs aspiration pneumonitis radiologically, but minimal oxygen requirements P:   BiPAP contraindicated Goal pH>7.30, SpO2>92 Continuous mechanical support VAP bundle Daily SBT Trend ABG/CXR Extubate when sedation is weaned  CARDIOVASCULAR A:  Chest pain on admission but serial troponin negative Suspect acute systolic CHF SIRS / sepsis, off vasopressors, lactate reassuring P:  Goal MAP > 65 D/c Neo-Synephrine Heparin d/c'd as suspect acute upper  GI hemorrhage ASA intolerance BB / ACEI contraindicated   RENAL A:   Hypokalemia P:   CVP Trend BMP K 10 x 4 Lasix 40 x 1  GASTROINTESTINAL A:   Recent c.diff colitis Suspected upper GI hemorrhage, resolved  Nausea / Vomiting P:   Has finished C.diff therapy NPO  Protonix to q12h TF if not extubated today  HEM A: Anemia, acute on chronic, Hb stable VTE Px P: Change CBC to daily SCDs  INFECTIOUS A:   Probable PNA UTI P:   Cultures and antibiotics as above PCT   NEURO A: Sedation for comfort / ventilator synchrony P: Goal RASS 0 to -1 D/c Fentanyl gtt Start Fentanyl PRN Start Precedex gtt  I have personally obtained history, examined patient, evaluated and interpreted laboratory and imaging results, reviewed medical records, formulated assessment / plan and placed orders.  CRITICAL CARE:  The patient is critically ill with multiple organ systems failure and requires high complexity decision making for assessment and support, frequent evaluation and titration of therapies, application of advanced monitoring technologies and extensive interpretation of multiple databases. Critical Care Time devoted to patient care services described in this note is 35 minutes.   Doree Fudge, MD Pulmonary and [place] Pager: [phone]  11/10/2013, 10:54 AM

## 2013-11-10 NOTE — Clinical Documentation Improvement (Signed)
Possible Clinical Conditions   - Sepsis, confirmed and Present on Admission   - Sepsis, Not Present on Admission, but developed during the hospital stay   - Sepsis IS NOT Applicable to this Admission   Clinical Information:  SIRS/ Sepsis documented in CCM H&P  WBC 15.8 Lactic Acid  2.39   Thank You,  Erling Conte ,RN BSN CCDS Certified Clinical Documentation Specialist [phone] [facility]

## 2013-11-10 NOTE — Progress Notes (Signed)
RT Note-patient's sedation has been changed, will continue wean as tolerated. Weaned for several hours this AM.

## 2013-11-11 DIAGNOSIS — A0472 Enterocolitis due to Clostridium difficile, not specified as recurrent: Secondary | ICD-10-CM

## 2013-11-11 DIAGNOSIS — K920 Hematemesis: Secondary | ICD-10-CM

## 2013-11-11 DIAGNOSIS — D649 Anemia, unspecified: Secondary | ICD-10-CM

## 2013-11-11 DIAGNOSIS — K219 Gastro-esophageal reflux disease without esophagitis: Secondary | ICD-10-CM

## 2013-11-11 LAB — BASIC METABOLIC PANEL
BUN: 15 mg/dL (ref 6–23)
CO2: 27 mEq/L (ref 19–32)
CREATININE: 1.15 mg/dL — AB (ref 0.50–1.10)
Calcium: 8.4 mg/dL (ref 8.4–10.5)
Chloride: 107 mEq/L (ref 96–112)
GFR, EST AFRICAN AMERICAN: 58 mL/min — AB (ref >90)
GFR, EST NON AFRICAN AMERICAN: 50 mL/min — AB (ref >90)
GLUCOSE: 85 mg/dL (ref 70–99)
Potassium: 3.9 mEq/L (ref 3.7–5.3)
Sodium: 146 mEq/L (ref 137–147)

## 2013-11-11 LAB — CBC
HEMATOCRIT: 23.3 % — AB (ref 36.0–46.0)
HEMOGLOBIN: 8 g/dL — AB (ref 12.0–15.0)
MCH: 29.6 pg (ref 26.0–34.0)
MCHC: 34.3 g/dL (ref 30.0–36.0)
MCV: 86.3 fL (ref 78.0–100.0)
Platelets: 237 10*3/uL (ref 150–400)
RBC: 2.7 MIL/uL — ABNORMAL LOW (ref 3.87–5.11)
RDW: 18.6 % — ABNORMAL HIGH (ref 11.5–15.5)
WBC: 5.8 10*3/uL (ref 4.0–10.5)

## 2013-11-11 LAB — GLUCOSE, CAPILLARY: Glucose-Capillary: 85 mg/dL (ref 70–99)

## 2013-11-11 MED ORDER — LEVOTHYROXINE SODIUM 100 MCG IV SOLR
44.0000 ug | Freq: Every day | INTRAVENOUS | Status: DC
  Administered 2013-11-11 – 2013-11-14 (×4): 44 ug via INTRAVENOUS
  Filled 2013-11-11 (×4): qty 5

## 2013-11-11 MED ORDER — DEXMEDETOMIDINE HCL IN NACL 400 MCG/100ML IV SOLN
0.4000 ug/kg/h | INTRAVENOUS | Status: DC
  Administered 2013-11-11: 0.401 ug/kg/h via INTRAVENOUS
  Administered 2013-11-11: 0.7 ug/kg/h via INTRAVENOUS
  Administered 2013-11-11: 0.9 ug/kg/h via INTRAVENOUS
  Administered 2013-11-12 (×3): 1 ug/kg/h via INTRAVENOUS
  Administered 2013-11-12: 0.4 ug/kg/h via INTRAVENOUS
  Administered 2013-11-13 (×2): 0.5 ug/kg/h via INTRAVENOUS
  Filled 2013-11-11 (×8): qty 100

## 2013-11-11 MED ORDER — HYDROMORPHONE HCL PF 1 MG/ML IJ SOLN: 2.0000 mg | Freq: Once | INTRAMUSCULAR | Status: DC
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
## 2013-11-11 NOTE — Progress Notes (Signed)
PULMONARY / CRITICAL CARE MEDICINE  Name: [name] MRN: [id number] DOB: 1950-10-07    ADMISSION DATE:  11/13/2013 CONSULTATION DATE:  11/19/2013  REFERRING MD :  EDP PRIMARY SERVICE:  PCCM  CHIEF COMPLAINT:  Respiratory failure  BRIEF PATIENT DESCRIPTION: 63 year old obese female with history of HTN, GERD, depression, obesity and lumbar spondylosis status post lumbar decompression surgery in January 2015.  Just discharged 11/04/13 after treatment for c.diff colitis and salmonella enterocolitis. Hospitalization complicated by encephalopathy and acute renal failure (peak creatinine 3.03). Admitted 2/25 with respiratory failure though due to combination PNA and HF.  EVENTS / STUDIES: 3/2  TTE >>> EF 60, no RWMA  LINES / TUBES: Foley  3/1 >>> R IJ TLC 3/1 >>> OETT  3/2 >>> NGT  3/2 >>>  CULTURES: 3/1  MRSA PCR >>> neg 3/1  Blood >>> 3/1  Urine >>> neg  ANTIBIOTICS: Zosyn 2/25>>> Vancomycin 2/25 >>>  INTERVAL HISTORY:  No issues overnight  VITAL SIGNS: Temp:  [98 F (36.7 C)-99.1 F (37.3 C)] 98 F (36.7 C) (03/04 0843) Pulse Rate:  [71-107] 74 (03/04 0800) Resp:  [16-36] 23 (03/04 0800) BP: (87-124)/(49-79) 104/69 mmHg (03/04 0800) SpO2:  [96 %-100 %] 99 % (03/04 0800) FiO2 (%):  [40 %] 40 % (03/04 0743) Weight:  [119.7 kg (263 lb 14.3 oz)] 119.7 kg (263 lb 14.3 oz) (03/04 0500)  EMODYNAMICS: CVP:  [7 mmHg-10 mmHg] 8 mmHg  VENTILATOR SETTINGS: Vent Mode:  [-] PSV;CPAP FiO2 (%):  [40 %] 40 % Set Rate:  [16 bmp] 16 bmp Vt Set:  [420 mL] 420 mL PEEP:  [5 cmH20] 5 cmH20 Pressure Support:  [5 cmH20-14 cmH20] 12 cmH20 Plateau Pressure:  [19 cmH20-28 cmH20] 24 cmH20  INTAKE / OUTPUT: Intake/Output     03/03 0701 - 03/04 0700 03/04 0701 - 03/05 0700   I.V. (mL/kg) 302.7 (2.5) 12 (0.1)   IV Piggyback 750    Total Intake(mL/kg) 1052.7 (8.8) 12 (0.1)   Urine (mL/kg/hr) 2095 (0.7) 150 (0.6)   Emesis/NG output     Total Output 2095 150   Net -1042.3 -138        Urine Occurrence 1 x      PHYSICAL EXAMINATION: General:  Ventilated, no distress Neuro:  Opens eyes to speech, does not follow commands, cough / gag dimonished HEENT:  OETT / NGT Neck: No JVD Cardiovascular:  Regular, no murmurs Lungs:  Bilateral air entry, no added sounds Abdomen:  Soft, non tender, bowel sounds diminished  Musculoskeletal:  Moves all extremities, pitting edema bilaterally Skin:  Intact  LABS: CBC  Recent Labs Lab 11/10/13 0844 11/10/13 1444 11/11/13 0407  WBC 9.5 9.4 5.8  HGB 8.5* 9.0* 8.0*  HCT 24.3* 26.4* 23.3*  PLT 278 286 237   Coag's No results found for this basename: APTT, INR,  in the last 168 hours  BMET  Recent Labs Lab 11/09/13 0500 11/10/13 0419 11/11/13 0407  NA 138 145 146  K 3.1* 3.7 3.9  CL 102 107 107  CO2 25 24 27   BUN 17 16 15   CREATININE 0.99 1.04 1.15*  GLUCOSE 103* 88 85   Electrolytes  Recent Labs Lab 11/30/2013 1512 11/09/13 0500 11/10/13 0419 11/11/13 0407  CALCIUM 8.6 8.3* 8.4 8.4  MG 1.7  --   --   --    Sepsis Markers  Recent Labs Lab 12/05/2013 1944  11/09/13 0500  11/09/13 0829 11/09/13 1429 11/09/13 2018 11/10/13 0419  LATICACIDVEN 1.6  < >  --   < >  0.8 0.7 0.9  --   PROCALCITON 7.72  --  6.94  --   --   --   --  4.86  < > = values in this interval not displayed. ABG  Recent Labs Lab 12/08/2013 1538 11/09/13 1006  PHART 7.470* 7.365  PCO2ART 34.0* 46.2*  PO2ART 336.0* 230.0*   Liver Enzymes  Recent Labs Lab 11/23/2013 1512  AST 29  ALT 29  ALKPHOS 89  BILITOT 0.3  ALBUMIN 2.3*   Cardiac Enzymes  Recent Labs Lab 11/29/2013 1512  11/30/2013 2227 11/09/13 0014 11/09/13 0510  TROPONINI  --   < > <0.30 <0.30 <0.30  PROBNP 1572.0*  --   --   --   --   < > = values in this interval not displayed. Glucose  Recent Labs Lab 12/06/2013 1858 11/11/13 0350  GLUCAP 112* 85   IMAGING:  Dg Chest Port 1 View  11/10/2013   CLINICAL DATA:  Endotracheal tube position  EXAM: PORTABLE CHEST  - 1 VIEW  COMPARISON:  11/09/2013  FINDINGS: Endotracheal tube is 10 mm above the carina. Right jugular catheter tip in the right atrium. NG tube in the stomach with the tip extending back to the GE junction. This is unchanged.  Increase in bibasilar atelectasis and small effusions. Increased vascular congestion and edema suggesting fluid overload.  IMPRESSION: Endotracheal tube is 10 mm above the carina.  Worsening heart failure and edema. Increased bibasilar atelectasis and bilateral effusions.   Electronically Signed   By: Franchot Gallo M.D.   On: 11/10/2013 07:24   Dg Chest Port 1 View  11/09/2013   CLINICAL DATA:  Hypoxia  EXAM: PORTABLE CHEST - 1 VIEW  COMPARISON:  November 08, 2013  FINDINGS: Endotracheal tube tip is 2.2 cm above the carina. Central catheter tip is in the right atrium. There is a nasogastric tube with tip and side-port in the stomach. No pneumothorax.  There is left lower lobe consolidation which was present 1 day prior. There is new consolidation in the right upper lobe. Heart is upper normal in size with normal pulmonary vascularity. No adenopathy.  IMPRESSION: Tube and catheter positions as described. No pneumothorax. Note that the endotracheal tube may warrant withdrawal of 1-2 cm. There is consolidation in the right upper lobe and left lower lobe with a right upper lobe consolidation new.   Electronically Signed   By: Lowella Grip M.D.   On: 11/09/2013 09:51   Dg Abd Portable 1v  11/09/2013   CLINICAL DATA:  Small bowel obstruction  EXAM: PORTABLE ABDOMEN - 1 VIEW  COMPARISON:  DG ABD PORTABLE 2V dated 10/27/2013; CT ABD/PELV WO CM dated 10/26/2013  FINDINGS: No disproportionate dilatation of bowel. NG tube is coiled in the stomach. The tip is in the fundus. Gas-filled loops of colon project over the lower abdomen. There is suspected wall thickening in the cecum. No pneumatosis. No obvious free intraperitoneal gas.  IMPRESSION: Nonobstructive bowel gas pattern.  Colonic wall  thickening is suspected.   Electronically Signed   By: Maryclare Bean M.D.   On: 11/09/2013 10:02   ASSESSMENT / PLAN:  PULMONARY A:   Acute respiratory failure, suspect combination PNA and acute HF Possible aspiration pneumonia ARDS / pulmonary edema vs aspiration pneumonitis radiologically, but minimal oxygen requirements P:   BiPAP contraindicated Goal pH>7.30, SpO2>92 Continuous mechanical support VAP bundle Daily SBT Trend ABG/CXR Mental status is limiting extubation, also would hold if need EGD  CARDIOVASCULAR A:  Chest pain on admission  but serial troponin negative Suspect acute diastolic CHF SIRS / sepsis, off vasopressors, lactate reassuring P:  Goal MAP > 65 Heparin d/c'd as suspect acute upper GI hemorrhage ASA intolerance BB / ACEI contraindicated as borderline BP  RENAL A:   Hypokalemia Bump in creatinine CVP 12 P:   CVP Trend BMP  GASTROINTESTINAL A:   Recent c.diff colitis Suspected upper GI hemorrhage Nausea / Vomiting P:   Has finished C.diff therapy NPO  Protonix to q12h Hold TF as may need EGD Consult GI as Hb continues to drop  HEM A: Anemia, acute on chronic, Hb decreasing VTE Px P: Trend CBC SCDs  INFECTIOUS A:   Probable PNA PCT 4.86 UTI P:   Cultures and antibiotics as above  NEURO A: Sedation for comfort / ventilator synchrony P: Goal RASS 0 to -1 Precedex gtt  I have personally obtained history, examined patient, evaluated and interpreted laboratory and imaging results, reviewed medical records, formulated assessment / plan and placed orders.  CRITICAL CARE:  The patient is critically ill with multiple organ systems failure and requires high complexity decision making for assessment and support, frequent evaluation and titration of therapies, application of advanced monitoring technologies and extensive interpretation of multiple databases. Critical Care Time devoted to patient care services described in this note is 35  minutes.   Doree Fudge, MD Pulmonary and [place] Pager: [phone]  11/11/2013, 9:07 AM

## 2013-11-12 LAB — BASIC METABOLIC PANEL
BUN: 16 mg/dL (ref 6–23)
CALCIUM: 8.3 mg/dL — AB (ref 8.4–10.5)
CO2: 24 mEq/L (ref 19–32)
CREATININE: 1.22 mg/dL — AB (ref 0.50–1.10)
Chloride: 105 mEq/L (ref 96–112)
GFR calc Af Amer: 54 mL/min — ABNORMAL LOW (ref >90)
GFR calc non Af Amer: 46 mL/min — ABNORMAL LOW (ref >90)
Glucose, Bld: 99 mg/dL (ref 70–99)
Potassium: 3.7 mEq/L (ref 3.7–5.3)
Sodium: 144 mEq/L (ref 137–147)

## 2013-11-12 LAB — CBC
HEMATOCRIT: 25.1 % — AB (ref 36.0–46.0)
Hemoglobin: 8.4 g/dL — ABNORMAL LOW (ref 12.0–15.0)
MCH: 29.6 pg (ref 26.0–34.0)
MCHC: 33.5 g/dL (ref 30.0–36.0)
MCV: 88.4 fL (ref 78.0–100.0)
PLATELETS: 213 10*3/uL (ref 150–400)
RBC: 2.84 MIL/uL — AB (ref 3.87–5.11)
RDW: 18.6 % — ABNORMAL HIGH (ref 11.5–15.5)
WBC: 5.1 10*3/uL (ref 4.0–10.5)

## 2013-11-12 LAB — VANCOMYCIN, TROUGH: Vancomycin Tr: 34.6 ug/mL (ref 10.0–20.0)

## 2013-11-12 MED ORDER — WHITE PETROLATUM GEL
Status: AC
  Administered 2013-11-12: 0.2
  Filled 2013-11-12: qty 5

## 2013-11-12 MED ORDER — SODIUM CHLORIDE 0.9 % IV SOLN
INTRAVENOUS | Status: DC
  Administered 2013-11-12 – 2013-11-13 (×2): via INTRAVENOUS

## 2013-11-12 NOTE — Progress Notes (Signed)
CRITICAL VALUE ALERT  Critical value received:  Vancomycin Tr= 34.6  Date of notification:  11/12/2013  Time of notification:  0500  Critical value read back:yes  Nurse who received alert:  Allegra Grana, RN  MD notified (1st page):  Pharmacy  Time of first page:  0500  MD notified (2nd page):  Time of second page:  Responding MD:  Pharmacy  Time MD responded:  0500

## 2013-11-12 NOTE — Progress Notes (Signed)
PULMONARY / CRITICAL CARE MEDICINE  Name: [name] MRN: [id number] DOB: 1950-11-13    ADMISSION DATE:  11/25/2013 CONSULTATION DATE:  11/09/2013  REFERRING MD :  EDP PRIMARY SERVICE:  PCCM  CHIEF COMPLAINT:  Respiratory failure  BRIEF PATIENT DESCRIPTION: 63 year old obese female with history of HTN, GERD, depression, obesity and lumbar spondylosis status post lumbar decompression surgery in January 2015.  Just discharged 11/04/13 after treatment for c.diff colitis and salmonella enterocolitis. Hospitalization complicated by encephalopathy and acute renal failure (peak creatinine 3.03). Admitted 2/25 with respiratory failure though due to combination PNA and HF.  EVENTS / STUDIES: 3/2  TTE >>> EF 60, no RWMA 3/4  Seen by GI >>> no indications for EGD  LINES / TUBES: Foley  3/1 >>> R IJ TLC 3/1 >>> OETT  3/2 >>> NGT  3/2 >>>  CULTURES: 3/1  MRSA PCR >>> neg 3/1  Blood >>> 3/1  Urine >>> neg  ANTIBIOTICS: Zosyn 2/25>>> Vancomycin 2/25 >>>  INTERVAL HISTORY:  No issues overnight  VITAL SIGNS: Temp:  [98 F (36.7 C)-98.5 F (36.9 C)] 98.4 F (36.9 C) (03/05 0841) Pulse Rate:  [55-81] 56 (03/05 0821) Resp:  [14-25] 18 (03/05 0900) BP: (85-130)/(45-98) 120/89 mmHg (03/05 0900) SpO2:  [96 %-100 %] 100 % (03/05 0821) FiO2 (%):  [40 %] 40 % (03/05 0821) Weight:  [120.8 kg (266 lb 5.1 oz)] 120.8 kg (266 lb 5.1 oz) (03/05 0500)  EMODYNAMICS: CVP:  [8 mmHg-10 mmHg] 10 mmHg  VENTILATOR SETTINGS: Vent Mode:  [-] PSV;CPAP FiO2 (%):  [40 %] 40 % Set Rate:  [16 bmp] 16 bmp Vt Set:  [420 mL] 420 mL PEEP:  [5 cmH20] 5 cmH20 Pressure Support:  [10 cmH20] 10 cmH20 Plateau Pressure:  [19 cmH20-20 cmH20] 19 cmH20  INTAKE / OUTPUT: Intake/Output     03/04 0701 - 03/05 0700 03/05 0701 - 03/06 0700   I.V. (mL/kg) 513.1 (4.2) 59.8 (0.5)   IV Piggyback 350    Total Intake(mL/kg) 863.1 (7.1) 59.8 (0.5)   Urine (mL/kg/hr) 1035 (0.4) 200 (0.5)   Total Output 1035 200   Net  -171.9 -140.2          PHYSICAL EXAMINATION: General:  Ventilated, no distress Neuro:  Sleepy but arousable HEENT:  OETT / NGT with greenish drainage  Neck: No JVD Cardiovascular:  Regular, no murmurs Lungs:  Bilateral air entry, no added sounds Abdomen:  Soft, non tender, bowel sounds diminished  Musculoskeletal:  Moves all extremities, pitting edema bilaterally Skin:  Intact  LABS: CBC  Recent Labs Lab 11/10/13 1444 11/11/13 0407 11/12/13 0415  WBC 9.4 5.8 5.1  HGB 9.0* 8.0* 8.4*  HCT 26.4* 23.3* 25.1*  PLT 286 237 213   Coag's No results found for this basename: APTT, INR,  in the last 168 hours  BMET  Recent Labs Lab 11/10/13 0419 11/11/13 0407 11/12/13 0415  NA 145 146 144  K 3.7 3.9 3.7  CL 107 107 105  CO2 24 27 24   BUN 16 15 16   CREATININE 1.04 1.15* 1.22*  GLUCOSE 88 85 99   Electrolytes  Recent Labs Lab 12/02/2013 1512  11/10/13 0419 11/11/13 0407 11/12/13 0415  CALCIUM 8.6  < > 8.4 8.4 8.3*  MG 1.7  --   --   --   --   < > = values in this interval not displayed. Sepsis Markers  Recent Labs Lab 11/26/2013 1944  11/09/13 0500  11/09/13 5284 11/09/13 1429 11/09/13 2018 11/10/13 1324  LATICACIDVEN 1.6  < >  --   < > 0.8 0.7 0.9  --   PROCALCITON 7.72  --  6.94  --   --   --   --  4.86  < > = values in this interval not displayed. ABG  Recent Labs Lab 11/27/2013 1538 11/09/13 1006  PHART 7.470* 7.365  PCO2ART 34.0* 46.2*  PO2ART 336.0* 230.0*   Liver Enzymes  Recent Labs Lab 11/10/2013 1512  AST 29  ALT 29  ALKPHOS 89  BILITOT 0.3  ALBUMIN 2.3*   Cardiac Enzymes  Recent Labs Lab 11/19/2013 1512  11/19/2013 2227 11/09/13 0014 11/09/13 0510  TROPONINI  --   < > <0.30 <0.30 <0.30  PROBNP 1572.0*  --   --   --   --   < > = values in this interval not displayed. Glucose  Recent Labs Lab 11/09/2013 1858 11/11/13 0350  GLUCAP 112* 85   IMAGING:  No results found.  ASSESSMENT / PLAN:  PULMONARY A:   Acute  respiratory failure, suspect combination PNA and acute HF Possible aspiration pneumonia ARDS / pulmonary edema vs aspiration pneumonitis radiologically, but minimal oxygen requirements P:   BiPAP contraindicated Goal pH>7.30, SpO2>92 Continuous mechanical support VAP bundle Daily SBT Trend ABG/CXR Possible extubation today as mental status improved / no EGD planned  CARDIOVASCULAR A:  Chest pain on admission but serial troponin negative Suspect acute diastolic CHF SIRS / sepsis, off vasopressors, lactate reassuring P:  Goal MAP > 65 Heparin d/c'd as suspect acute upper GI hemorrhage ASA intolerance BB / ACEI contraindicated as borderline BP  RENAL A:   Hypokalemia Bump in creatinine P:   CVP Trend BMP  GASTROINTESTINAL A:   Recent c.diff colitis, treated Suspected upper GI hemorrhage Nausea / vomiting, resolved P:   GI input appreciated NPO  Protonix q12h Hold TF as may extubate  HEM A: Anemia, acute on chronic VTE Px P: Trend CBC SCDs  INFECTIOUS A:   Probable PNA PCT 4.86 UTI P:   Cultures and antibiotics as above  NEURO A: Sedation for comfort / ventilator synchrony P: Goal RASS 0 to -1 Precedex gtt  I have personally obtained history, examined patient, evaluated and interpreted laboratory and imaging results, reviewed medical records, formulated assessment / plan and placed orders.  CRITICAL CARE:  The patient is critically ill with multiple organ systems failure and requires high complexity decision making for assessment and support, frequent evaluation and titration of therapies, application of advanced monitoring technologies and extensive interpretation of multiple databases. Critical Care Time devoted to patient care services described in this note is 35 minutes.   Doree Fudge, MD Pulmonary and [place] Pager: [phone]  11/12/2013, 10:08 AM

## 2013-11-12 NOTE — Progress Notes (Signed)
ANTIBIOTIC CONSULT NOTE - FOLLOW UP  Pharmacy Consult for Vancomycin  Indication: rule out pneumonia  Labs:  Recent Labs  11/10/13 0419 11/10/13 0844 11/10/13 1444 11/11/13 0407  WBC  --  9.5 9.4 5.8  HGB  --  8.5* 9.0* 8.0*  PLT  --  278 286 237  CREATININE 1.04  --   --  1.15*    Recent Labs  11/12/13 0340  VANCOTROUGH 34.6*    Assessment: Elevated vancomycin trough of 34.6 on 1000 mg IV q12h, this trough was drawn correctly (verified with RN).  Goal of Therapy:  Vancomycin trough level 15-20 mcg/ml  Plan:  -Re-check VT in the AM to assess clearance and dosing needs  Narda Bonds 11/12/2013,5:02 AM

## 2013-11-12 NOTE — Procedures (Signed)
Extubation Procedure Note  Patient Details:   Name: [name] DOB: 13-Nov-1950 MRN: [id number]   Airway Documentation:     Evaluation  O2 sats: stable throughout Complications: No apparent complications Patient did tolerate procedure well. Bilateral Breath Sounds: Clear;Diminished Suctioning: Airway Yes Patient tolerated wean. MD ordered to extubate. Positive for cuff leak. Patient extubated to a 4Lpm nasal cannula. No signs of dyspnea or stridor. Family at bedside. Will continue to monitor.  Myrtie Neither 11/12/2013, 11:00 AM

## 2013-11-13 LAB — BASIC METABOLIC PANEL
BUN: 16 mg/dL (ref 6–23)
CALCIUM: 8.3 mg/dL — AB (ref 8.4–10.5)
CO2: 19 mEq/L (ref 19–32)
CREATININE: 1.25 mg/dL — AB (ref 0.50–1.10)
Chloride: 107 mEq/L (ref 96–112)
GFR calc non Af Amer: 45 mL/min — ABNORMAL LOW (ref >90)
GFR, EST AFRICAN AMERICAN: 52 mL/min — AB (ref >90)
GLUCOSE: 94 mg/dL (ref 70–99)
Potassium: 3.6 mEq/L — ABNORMAL LOW (ref 3.7–5.3)
Sodium: 149 mEq/L — ABNORMAL HIGH (ref 137–147)

## 2013-11-13 LAB — CBC
HCT: 25.3 % — ABNORMAL LOW (ref 36.0–46.0)
HEMOGLOBIN: 8.5 g/dL — AB (ref 12.0–15.0)
MCH: 29.6 pg (ref 26.0–34.0)
MCHC: 33.6 g/dL (ref 30.0–36.0)
MCV: 88.2 fL (ref 78.0–100.0)
Platelets: 203 10*3/uL (ref 150–400)
RBC: 2.87 MIL/uL — ABNORMAL LOW (ref 3.87–5.11)
RDW: 18.6 % — AB (ref 11.5–15.5)
WBC: 5.1 10*3/uL (ref 4.0–10.5)

## 2013-11-13 LAB — VANCOMYCIN, RANDOM: VANCOMYCIN RM: 22.5 ug/mL

## 2013-11-13 MED ORDER — WHITE PETROLATUM GEL
Status: AC
  Administered 2013-11-13: 10:00:00
  Filled 2013-11-13: qty 5

## 2013-11-13 MED ORDER — HALOPERIDOL LACTATE 5 MG/ML IJ SOLN
5.0000 mg | Freq: Four times a day (QID) | INTRAMUSCULAR | Status: DC | PRN
  Administered 2013-11-13 (×2): 5 mg via INTRAVENOUS
  Filled 2013-11-13 (×2): qty 1

## 2013-11-13 MED ORDER — SODIUM CHLORIDE 0.9 % IV BOLUS (SEPSIS)
500.0000 mL | Freq: Once | INTRAVENOUS | Status: AC
  Administered 2013-11-13: 500 mL via INTRAVENOUS

## 2013-11-13 MED ORDER — RESOURCE THICKENUP CLEAR PO POWD
ORAL | Status: DC | PRN
  Filled 2013-11-13: qty 125

## 2013-11-13 MED ORDER — DEXTROSE 5 % IV SOLN
INTRAVENOUS | Status: DC
  Administered 2013-11-13: 20:00:00 via INTRAVENOUS
  Administered 2013-11-16 – 2013-11-18 (×2): 1000 mL via INTRAVENOUS
  Administered 2013-11-18: 20:00:00 via INTRAVENOUS
  Administered 2013-11-18: 1000 mL via INTRAVENOUS
  Administered 2013-11-19 – 2013-11-20 (×2): via INTRAVENOUS

## 2013-11-13 MED ORDER — QUETIAPINE FUMARATE ER 200 MG PO TB24
200.0000 mg | ORAL_TABLET | Freq: Every day | ORAL | Status: DC
  Administered 2013-11-13 – 2013-11-19 (×6): 200 mg via ORAL
  Filled 2013-11-13 (×8): qty 1

## 2013-11-13 MED ORDER — ENSURE PUDDING PO PUDG
1.0000 | Freq: Three times a day (TID) | ORAL | Status: DC
Start: 2013-11-13 — End: 2013-11-20
  Administered 2013-11-13 – 2013-11-15 (×3): 1 via ORAL

## 2013-11-13 MED ORDER — HALOPERIDOL LACTATE 5 MG/ML IJ SOLN
INTRAMUSCULAR | Status: AC
  Administered 2013-11-13: 5 mg via INTRAVENOUS
  Filled 2013-11-13: qty 1

## 2013-11-13 MED ORDER — BUPROPION HCL ER (XL) 150 MG PO TB24
150.0000 mg | ORAL_TABLET | Freq: Every day | ORAL | Status: DC
  Administered 2013-11-13 – 2013-12-02 (×14): 150 mg via ORAL
  Filled 2013-11-13 (×21): qty 1

## 2013-11-13 MED ORDER — ESCITALOPRAM OXALATE 20 MG PO TABS
20.0000 mg | ORAL_TABLET | Freq: Every day | ORAL | Status: DC
  Administered 2013-11-13 – 2013-12-10 (×26): 20 mg via ORAL
  Filled 2013-11-13 (×28): qty 1

## 2013-11-13 MED ORDER — LAMOTRIGINE 200 MG PO TABS
200.0000 mg | ORAL_TABLET | Freq: Every day | ORAL | Status: DC
  Administered 2013-11-13 – 2013-12-12 (×26): 200 mg via ORAL
  Filled 2013-11-13 (×31): qty 1
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
## 2013-11-13 NOTE — Progress Notes (Addendum)
PULMONARY / CRITICAL CARE MEDICINE  Name: [name] MRN: [id number] DOB: 1951/03/30    ADMISSION DATE:  11/30/2013 CONSULTATION DATE:  2013-11-30  REFERRING MD :  EDP PRIMARY SERVICE:  PCCM  CHIEF COMPLAINT:  Respiratory failure  BRIEF PATIENT DESCRIPTION: 63 year old obese female with history of HTN, GERD, depression, obesity and lumbar spondylosis status post lumbar decompression surgery in January 2015.  Just discharged 11/04/13 after treatment for c.diff colitis and salmonella enterocolitis. Hospitalization complicated by encephalopathy and acute renal failure (peak creatinine 3.03). Admitted 2/25 with respiratory failure though due to combination PNA and HF.  EVENTS / STUDIES: 3/2  TTE >>> EF 60, no RWMA  LINES / TUBES: Foley  3/1 >>> R IJ TLC 3/1 >>> 3/6 OETT  3/2 >>> 3/5 NGT  3/2 >>> 3/5  CULTURES: 3/1  MRSA PCR >>> neg 3/1  Blood >>> 3/1  Urine >>> neg  ANTIBIOTICS: Zosyn 2/25 >>> 3/6 Vancomycin 2/25 >>> 3/6  INTERVAL HISTORY:  No issues overnight  VITAL SIGNS: Temp:  [97.8 F (36.6 C)-98.5 F (36.9 C)] 98.5 F (36.9 C) (03/06 0811) Pulse Rate:  [50-96] 79 (03/06 0700) Resp:  [16-33] 19 (03/06 0700) BP: (51-172)/(36-155) 107/61 mmHg (03/06 0800) SpO2:  [94 %-100 %] 98 % (03/06 0700) Weight:  [117.2 kg (258 lb 6.1 oz)] 117.2 kg (258 lb 6.1 oz) (03/06 0200)  EMODYNAMICS: CVP:  [7 mmHg-10 mmHg] 9 mmHg  VENTILATOR SETTINGS:    INTAKE / OUTPUT: Intake/Output     03/05 0701 - 03/06 0700 03/06 0701 - 03/07 0700   I.V. (mL/kg) 733.1 (6.3) 35 (0.3)   IV Piggyback 137.5    Total Intake(mL/kg) 870.6 (7.4) 35 (0.3)   Urine (mL/kg/hr) 790 (0.3) 50 (0.1)   Total Output 790 50   Net +80.6 -15          PHYSICAL EXAMINATION: General:  No distress Neuro:  Confused, but follows commands and protects airway HEENT:  Moist membranes  Neck: No JVD Cardiovascular:  Regular, no murmurs Lungs:  Bilateral air entry Abdomen:  Soft, non tender, bowel sounds  normal Musculoskeletal:  Moves all extremities, pitting edema bilaterally Skin:  Intact  LABS: CBC  Recent Labs Lab 11/10/13 1444 11/11/13 0407 11/12/13 0415  WBC 9.4 5.8 5.1  HGB 9.0* 8.0* 8.4*  HCT 26.4* 23.3* 25.1*  PLT 286 237 213   Coag's No results found for this basename: APTT, INR,  in the last 168 hours  BMET  Recent Labs Lab 11/10/13 0419 11/11/13 0407 11/12/13 0415  NA 145 146 144  K 3.7 3.9 3.7  CL 107 107 105  CO2 24 27 24   BUN 16 15 16   CREATININE 1.04 1.15* 1.22*  GLUCOSE 88 85 99   Electrolytes  Recent Labs Lab 2013-11-30 1512  11/10/13 0419 11/11/13 0407 11/12/13 0415  CALCIUM 8.6  < > 8.4 8.4 8.3*  MG 1.7  --   --   --   --   < > = values in this interval not displayed. Sepsis Markers  Recent Labs Lab 2013-11-30 1944  11/09/13 0500  11/09/13 0829 11/09/13 1429 11/09/13 2018 11/10/13 0419  LATICACIDVEN 1.6  < >  --   < > 0.8 0.7 0.9  --   PROCALCITON 7.72  --  6.94  --   --   --   --  4.86  < > = values in this interval not displayed.  ABG  Recent Labs Lab November 30, 2013 1538 11/09/13 1006  PHART 7.470* 7.365  PCO2ART  34.0* 46.2*  PO2ART 336.0* 230.0*   Liver Enzymes  Recent Labs Lab 11/17/2013 1512  AST 29  ALT 29  ALKPHOS 89  BILITOT 0.3  ALBUMIN 2.3*   Cardiac Enzymes  Recent Labs Lab 11/24/2013 1512  11/17/2013 2227 11/09/13 0014 11/09/13 0510  TROPONINI  --   < > <0.30 <0.30 <0.30  PROBNP 1572.0*  --   --   --   --   < > = values in this interval not displayed. Glucose  Recent Labs Lab 11/19/2013 1858 11/11/13 0350  GLUCAP 112* 85   IMAGING:  No results found.  ASSESSMENT / PLAN:  PULMONARY A:   Acute respiratory failure, suspect combination PNA and acute HF, resolved Possible aspiration pneumonia / pneumonitis, completed abx P:   Goal SpO2>92 Supplemental oxygen PRN  CARDIOVASCULAR A:  Chest pain on admission but serial troponin negative Suspect acute on chronic diastolic CHF SIRS / sepsis, off  vasopressors, lactate reassuring P:  Goal MAP > 65 ASA intolerance D/c CVL  RENAL A:   No active issues P:   Trend BMP  GASTROINTESTINAL A:   Recent c.diff colitis, treated Suspected upper GI hemorrhage, resolved, no EGD recommended by GI Nutrition P:   SLP evaluation Diet as tolerated Protonix to q12h  HEM A: Anemia, acute on chronic, Hb decreasing VTE Px P: Trend CBC SCDs  INFECTIOUS A:   Probable PNA UTI P:   Antibiotics completed  ENDOCRINE A: Hypothyroidism P: Synthroid  NEURO A: Delirium Psychiatric history P: D/c Fentanyl / Precedex Restart Lexapro, Wellbutrin, Seroquel, Lamictal Haldol PRN PT / OT  Transfer to SDU 3/5.  PCCM will sign off.  TR[facility] to assume care 3/6.  I have personally obtained history, examined patient, evaluated and interpreted laboratory and imaging results, reviewed medical records, formulated assessment / plan and placed orders.  Doree Fudge, MD Pulmonary and [place] Pager: [phone]  11/13/2013, 10:36 AM

## 2013-11-13 NOTE — Progress Notes (Signed)
CRITICAL VALUE ALERT  Critical value received:  Troponin 0.45  Date of notification:  11/13/2013  Time of notification:  02:01  Critical value read back:yes  Nurse who received alert:  BShepherd, RN  MD notified (1st page):  Titus Mould  Time of first page:  02:03  MD notified (2nd page):  Time of second page:  Responding MD:  Titus Mould  Time MD responded:  02:05

## 2013-11-13 NOTE — Progress Notes (Signed)
NUTRITION FOLLOW UP  Intervention:    Ensure Pudding po TID, each supplement provides 170 kcal and 4 grams of protein  Nutrition Dx:   Inadequate oral intake now related to recent intubation, now with dysphagia as evidenced by diet just advanced. Ongoing.  Goal:   Intake to meet >90% of estimated nutrition needs.  Monitor:   PO intake, labs, weight trend.  Assessment:   63 year old obese female with history of HTN, GERD, depression, obesity and lumbar spondylosis status post lumbar decompression surgery in January 2015. Just discharged 11/04/13 after treatment for c.diff colitis and salmonella enterocolitis. Hospitalization complicated by encephalopathy and acute renal failure (peak creatinine 3.03). Admitted 2/25 with respiratory failure thought due to combination PNA and HF.  Patient was extubated on 3/5. Patient has not been receiving tube feeding. S/P bedside swallow evaluation with SLP this afternoon. Diet has been advanced to dysphagia 1 with nectar thick liquids.  Height: Ht Readings from Last 1 Encounters:  12/03/2013 5\' 3"  (1.6 m)    Weight Status:   Wt Readings from Last 1 Encounters:  11/13/13 258 lb 6.1 oz (117.2 kg)    Body mass index is 45.78 kg/(m^2).   Re-estimated needs:  Kcal: 2000 Protein: 100 gm Fluid: 2 L  Skin: no wounds  Diet Order:  NPO   Intake/Output Summary (Last 24 hours) at 11/13/13 1353 Last data filed at 11/13/13 1100  Gross per 24 hour  Intake  903.5 ml  Output    560 ml  Net  343.5 ml    Last BM: PTA   Labs:   Recent Labs Lab 11/25/2013 1512  11/11/13 0407 11/12/13 0415 11/13/13 1122  NA 139  < > 146 144 149*  K 3.2*  < > 3.9 3.7 3.6*  CL 102  < > 107 105 107  CO2 20  < > 27 24 19   BUN 18  < > 15 16 16   CREATININE 1.04  < > 1.15* 1.22* 1.25*  CALCIUM 8.6  < > 8.4 8.3* 8.3*  MG 1.7  --   --   --   --   GLUCOSE 131*  < > 85 99 94  < > = values in this interval not displayed.  CBG (last 3)   Recent Labs   11/11/13 0350  GLUCAP 85    Scheduled Meds: . buPROPion  150 mg Oral Daily  . escitalopram  20 mg Oral Daily  . lamoTRIgine  200 mg Oral Daily  . levothyroxine  44 mcg Intravenous Daily  . pantoprazole (PROTONIX) IV  40 mg Intravenous Q12H  . QUEtiapine  200 mg Oral QHS    Continuous Infusions: . sodium chloride 20 mL/hr at 11/13/13 0600    Molli Barrows, RD, LDN, [place] Pager [phone] After Hours Pager [phone]

## 2013-11-14 DIAGNOSIS — R1314 Dysphagia, pharyngoesophageal phase: Secondary | ICD-10-CM

## 2013-11-14 DIAGNOSIS — E039 Hypothyroidism, unspecified: Secondary | ICD-10-CM

## 2013-11-14 DIAGNOSIS — J690 Pneumonitis due to inhalation of food and vomit: Secondary | ICD-10-CM

## 2013-11-14 DIAGNOSIS — F329 Major depressive disorder, single episode, unspecified: Secondary | ICD-10-CM

## 2013-11-14 DIAGNOSIS — F3289 Other specified depressive episodes: Secondary | ICD-10-CM

## 2013-11-14 DIAGNOSIS — Z8659 Personal history of other mental and behavioral disorders: Secondary | ICD-10-CM

## 2013-11-14 LAB — CBC
HCT: 24.1 % — ABNORMAL LOW (ref 36.0–46.0)
HEMOGLOBIN: 8 g/dL — AB (ref 12.0–15.0)
MCH: 29.2 pg (ref 26.0–34.0)
MCHC: 33.2 g/dL (ref 30.0–36.0)
MCV: 88 fL (ref 78.0–100.0)
Platelets: 188 10*3/uL (ref 150–400)
RBC: 2.74 MIL/uL — ABNORMAL LOW (ref 3.87–5.11)
RDW: 18.3 % — ABNORMAL HIGH (ref 11.5–15.5)
WBC: 7.3 10*3/uL (ref 4.0–10.5)

## 2013-11-14 LAB — CULTURE, BLOOD (ROUTINE X 2)
CULTURE: NO GROWTH
Culture: NO GROWTH

## 2013-11-14 LAB — BASIC METABOLIC PANEL
BUN: 15 mg/dL (ref 6–23)
CHLORIDE: 108 meq/L (ref 96–112)
CO2: 16 mEq/L — ABNORMAL LOW (ref 19–32)
Calcium: 8.4 mg/dL (ref 8.4–10.5)
Creatinine, Ser: 1.27 mg/dL — ABNORMAL HIGH (ref 0.50–1.10)
GFR, EST AFRICAN AMERICAN: 51 mL/min — AB (ref >90)
GFR, EST NON AFRICAN AMERICAN: 44 mL/min — AB (ref >90)
Glucose, Bld: 93 mg/dL (ref 70–99)
POTASSIUM: 3.1 meq/L — AB (ref 3.7–5.3)
Sodium: 150 mEq/L — ABNORMAL HIGH (ref 137–147)

## 2013-11-14 LAB — CLOSTRIDIUM DIFFICILE BY PCR: Toxigenic C. Difficile by PCR: POSITIVE — AB

## 2013-11-14 MED ORDER — SODIUM CHLORIDE 0.45 % IV SOLN
INTRAVENOUS | Status: DC
  Administered 2013-11-14: 17:00:00 via INTRAVENOUS
  Administered 2013-11-16: 1000 mL via INTRAVENOUS

## 2013-11-14 MED ORDER — PANTOPRAZOLE SODIUM 40 MG PO TBEC: 40.0000 mg | DELAYED_RELEASE_TABLET | Freq: Every day | ORAL | Status: DC

## 2013-11-14 MED ORDER — MORPHINE SULFATE 2 MG/ML IJ SOLN
2.0000 mg | Freq: Once | INTRAMUSCULAR | Status: AC
  Administered 2013-11-14: 2 mg via INTRAVENOUS
  Filled 2013-11-14: qty 1

## 2013-11-14 MED ORDER — VANCOMYCIN 50 MG/ML ORAL SOLUTION
500.0000 mg | Freq: Four times a day (QID) | ORAL | Status: AC
  Administered 2013-11-14 – 2013-11-28 (×39): 500 mg via ORAL
  Filled 2013-11-14 (×58): qty 10

## 2013-11-14 MED ORDER — LEVOTHYROXINE SODIUM 88 MCG PO TABS
88.0000 ug | ORAL_TABLET | Freq: Every day | ORAL | Status: DC
  Administered 2013-11-15 – 2013-12-12 (×26): 88 ug via ORAL
  Filled 2013-11-14 (×31): qty 1

## 2013-11-14 MED ORDER — METRONIDAZOLE IN NACL 5-0.79 MG/ML-% IV SOLN
500.0000 mg | Freq: Three times a day (TID) | INTRAVENOUS | Status: AC
  Administered 2013-11-14 – 2013-11-28 (×39): 500 mg via INTRAVENOUS
  Filled 2013-11-14 (×43): qty 100

## 2013-11-14 NOTE — Progress Notes (Signed)
[facility] PROGRESS NOTE  [name] [id number] DOB: 13-Jun-1951 DOA: 12/02/2013 PCP: [name], MD  Interim summary:  63 year old obese female with history of hypertension, hypothyroidism and psychosis who was discharged on 2/25 after hospitalization for C. difficile colitis and we admitted on 3/1 for acute respiratory failure secondary to pneumonia.  She was admitted to the critical care service and intubated. Echocardiogram checked and found to be unremarkable. Patient started on broad-spectrum antibiotics. By 3/5, she was able to be extubated and transferred to the hospitalist service in the step down unit starting 3/7. Trace pitting by speech therapy who initially recommended downgrading her diet to dysphagia with nectar thick liquids, however followup evaluation in 3/7 noted further downgraded to n.p.o. with meds only. Patient has been getting slightly more concentrated with increase in her sodium which was 150 by 3/7.  Assessment/Plan:   Acute respiratory failure: Secondary to aspiration pneumonia. He improved.    Upper GI bleed: Coffee ground material seen at time of intubation and also in NG tube canister following intubation on 3/3. Gastroenterology consulted. The following day, no further coffee ground material seen. But possibly some minor erosive esophagitis or gastritis. On PPI. Hemoglobin has remained stable.    Aspiration Pneumonia: A swallow eval today further downgraded.  Dysphagia: See below.  Depression Lexapro restarted.  Hypothyroidism: Synthroid changed to by mouth.  Hyperlipidemia: Stable. Statin we started  Hypertension: Stable.  History of C. difficile colitis: Stable. Completed course of antibiotics. History of psychosis: Likely being off of Seroquel, Lamictal and Lexapro for almost one week may be contributing factor to decreased mentation and worsening swallowing. Meds restarted 3/6 evening.  Hypernatremia: Change IV fluids to  half-normal saline.  Code Status: Full code Family Communication: Daughter at the bedside  Disposition Plan: Unclear. Physical therapy recommending skilled nursing. Hopefully patient will eventually be able to tolerate by mouth   Consultants:  Critical care  Speech therapy  Nutrition  Gastroenterology  Procedures:  Intubation 3/2-3/5  NG tube 3/2-3/5  Right IJ central line 3/1-3/6  Antibiotics:  IV Zosyn 3/1-3/6  HPI/Subjective: Patient with some decreased responsiveness. Still some coughing and shortness of breath.  Objective: Filed Vitals:   11/14/13 1400  BP: 111/58  Pulse: 113  Temp:   Resp: 26    Intake/Output Summary (Last 24 hours) at 11/14/13 1611 Last data filed at 11/14/13 1315  Gross per 24 hour  Intake 1028.33 ml  Output    538 ml  Net 490.33 ml   Filed Weights   11/13/13 0200 11/13/13 2338 11/14/13 0500  Weight: 117.2 kg (258 lb 6.1 oz) 120.1 kg (264 lb 12.4 oz) 120.1 kg (264 lb 12.4 oz)    Exam:   General:  Alert and oriented x2  Cardiovascular: Regular rate and rhythm, S1-S2  Respiratory: Mostly clear, few scattered rhonchi  Abdomen: Soft, nontender, nondistended, positive bowel sounds  Musculoskeletal: No clubbing or cyanosis, trace edema   Data Reviewed: Basic Metabolic Panel:  Recent Labs Lab 11/27/2013 1512  11/10/13 0419 11/11/13 0407 11/12/13 0415 11/13/13 1122 11/14/13 0250  NA 139  < > 145 146 144 149* 150*  K 3.2*  < > 3.7 3.9 3.7 3.6* 3.1*  CL 102  < > 107 107 105 107 108  CO2 20  < > 24 27 24 19  16*  GLUCOSE 131*  < > 88 85 99 94 93  BUN 18  < > 16 15 16 16 15   CREATININE 1.04  < > 1.04 1.15* 1.22* 1.25*  1.27*  CALCIUM 8.6  < > 8.4 8.4 8.3* 8.3* 8.4  MG 1.7  --   --   --   --   --   --   < > = values in this interval not displayed. Liver Function Tests:  Recent Labs Lab 12/06/2013 1512  AST 29  ALT 29  ALKPHOS 89  BILITOT 0.3  PROT 5.7*  ALBUMIN 2.3*   No results found for this basename: LIPASE,  AMYLASE,  in the last 168 hours No results found for this basename: AMMONIA,  in the last 168 hours CBC:  Recent Labs Lab 11/27/2013 1512  11/10/13 1444 11/11/13 0407 11/12/13 0415 11/13/13 1122 11/14/13 0250  WBC 15.8*  < > 9.4 5.8 5.1 5.1 7.3  NEUTROABS 14.6*  --   --   --   --   --   --   HGB 9.1*  < > 9.0* 8.0* 8.4* 8.5* 8.0*  HCT 25.8*  < > 26.4* 23.3* 25.1* 25.3* 24.1*  MCV 86.3  < > 86.6 86.3 88.4 88.2 88.0  PLT 323  < > 286 237 213 203 188  < > = values in this interval not displayed. Cardiac Enzymes:  Recent Labs Lab 11/29/2013 1944 12/03/2013 2227 11/09/13 0014 11/09/13 0510  TROPONINI 0.31* <0.30 <0.30 <0.30   BNP (last 3 results)  Recent Labs  11/25/2013 1512  PROBNP 1572.0*   CBG:  Recent Labs Lab 11/09/2013 1858 11/11/13 0350  GLUCAP 112* 85    Recent Results (from the past 240 hour(s))  CULTURE, BLOOD (ROUTINE X 2)     Status: None   Collection Time    12/05/2013  3:12 PM      Result Value Ref Range Status   Specimen Description BLOOD RIGHT ARM   Final   Special Requests BOTTLES DRAWN AEROBIC AND ANAEROBIC 5CC   Final   Culture  Setup Time     Final   Value: 12/03/2013 20:39     Performed at [facility]   Culture     Final   Value: NO GROWTH 5 DAYS     Performed at [facility]   Report Status 11/14/2013 FINAL   Final  CULTURE, BLOOD (ROUTINE X 2)     Status: None   Collection Time    11/25/2013  3:36 PM      Result Value Ref Range Status   Specimen Description BLOOD LEFT HAND   Final   Special Requests BOTTLES DRAWN AEROBIC AND ANAEROBIC 5CC EACH   Final   Culture  Setup Time     Final   Value: 12/03/2013 20:40     Performed at [facility]   Culture     Final   Value: NO GROWTH 5 DAYS     Performed at [facility]   Report Status 11/14/2013 FINAL   Final  MRSA PCR SCREENING     Status: None   Collection Time    12/05/2013  6:54 PM      Result Value Ref Range Status   MRSA by PCR NEGATIVE  NEGATIVE Final    Comment:            The GeneXpert MRSA Assay (FDA     approved for NASAL specimens     only), is one component of a     comprehensive MRSA colonization     surveillance program. It is not     intended to diagnose MRSA     infection  nor to guide or     monitor treatment for     MRSA infections.  URINE CULTURE     Status: None   Collection Time    12/02/2013  9:44 PM      Result Value Ref Range Status   Specimen Description URINE, CATHETERIZED   Final   Special Requests NONE   Final   Culture  Setup Time     Final   Value: 11/09/2013 04:04     Performed at [place]     Final   Value: NO GROWTH     Performed at [facility]   Culture     Final   Value: NO GROWTH     Performed at [facility]   Report Status 11/10/2013 FINAL   Final     Studies: No results found.  Scheduled Meds: . buPROPion  150 mg Oral Daily  . escitalopram  20 mg Oral Daily  . feeding supplement (ENSURE)  1 Container Oral TID BM  . lamoTRIgine  200 mg Oral Daily  . [START ON 11/15/2013] levothyroxine  88 mcg Oral QAC breakfast  . pantoprazole  40 mg Oral Daily  . QUEtiapine  200 mg Oral QHS   Continuous Infusions: . sodium chloride    . dextrose 50 mL/hr at 11/14/13 0300    Active Problems:   Acute respiratory failure   Upper GI bleed   Acute systolic CHF (congestive heart failure)   Pneumonia    Time spent: 35 minutes    [facility] Pager [phone]. If 7PM-7AM, please contact night-coverage at [email or web address], password [facility] 11/14/2013, 4:11 PM  LOS: 6 days

## 2013-11-14 NOTE — Progress Notes (Signed)
Speech Language Pathology Treatment: Dysphagia  Patient Details Name: [name] MRN: [id number] DOB: May 02, 1951 Today's Date: 11/14/2013 Time: 2119-4174 SLP Time Calculation (min): 22 min  Assessment / Plan / Recommendation Clinical Impression  Pt with wet vocal quality of baseline, able to clear mucous with cues; voice then clear. Thin liquids lead to immediate evidence of aspiration with cough followed by gagging and regurgitation. Nectar and purees are tolerate for 30 seconds, but then again followed by gagging and regurgitation, possibly even some vomiting. Pt reports nausea. Recommend NPO today though suggested RN could attempt meds in applesauce later. Pt may also have ice chips following oral care. SLP will f/u in am for ability to resume diet.    HPI HPI: 63 year old obese female with history of HTN, GERD, depression, obesity and lumbar spondylosis status post lumbar decompression surgery in January 2015.  Just discharged 11/04/13 after treatment for c.diff colitis and salmonella enterocolitis. Hospitalization complicated by encephalopathy and acute renal failure (peak creatinine 3.03). Admitted 2/25 with respiratory failure though due to combination PNA and HF.   Pertinent Vitals NA  SLP Plan  Continue with current plan of care    Recommendations Diet recommendations: NPO (except meds, ice chips after oral care) Medication Administration: Whole meds with puree              Oral Care Recommendations: Oral care Q4 per protocol Plan: Continue with current plan of care    GO    [facility], MA CCC-SLP [phone]   Lynann Beaver 11/14/2013, 1:12 PM

## 2013-11-14 NOTE — Evaluation (Signed)
Physical Therapy Evaluation Patient Details Name: JULIANAH MARCIEL MRN: [id number] DOB: Apr 24, 1951 Today's Date: 11/14/2013 Time: 4268-3419 PT Time Calculation (min): 42 min  PT Assessment / Plan / Recommendation History of Present Illness  63 year old obese female with history of HTN, GERD, depression, obesity and lumbar spondylosis status post lumbar PLIF surgery in January 2015.  Just discharged 11/04/13 after treatment for c.diff colitis and salmonella enterocolitis. Hospitalization complicated by encephalopathy and acute renal failure (peak creatinine 3.03). Admitted 2/25 with respiratory failure though due to combination PNA and HF. Pt [facility] after [facility]IF was home 1wk with falls and difficulty when she was readmitted last time then return to [facility] at that DC  Clinical Impression  Pt with continued decline in function since PLIF back in January and repeated hospitalizations. Pt encouraged to be OOB daily , perform bil LE HEP, and assist with all transfers acutely. Pt with generalized weakness and fatigue as well as below deficits and will benefit from acute therapy to maximize function, strength, and mobility to decrease burden of care. Pt incontinent of stool and required total assist for pericare with skin breakdown of gluteal cleft noted and RN informed. Pt with dizziness in standing with repeated trials.     PT Assessment  Patient needs continued PT services    Follow Up Recommendations  SNF    Does the patient have the potential to tolerate intense rehabilitation      Barriers to Discharge        Equipment Recommendations  None recommended by PT    Recommendations for Other Services     Frequency Min 2X/week    Precautions / Restrictions Precautions Precautions: Fall Precaution Comments: incontinent of stool; pt able to verbalize 2/3 back precautions and educated for benefit of maintaining back precautions Restrictions Weight Bearing Restrictions: No    Pertinent Vitals/Pain HR 123 BP 108/74 No pain     Mobility  Bed Mobility Overal bed mobility: Needs Assistance Bed Mobility: Rolling;Sidelying to Sit Rolling: Min assist Sidelying to sit: Min assist General bed mobility comments: cues for sequence and precautions Transfers Overall transfer level: Needs assistance Equipment used: Rolling walker (2 wheeled) Transfers: Sit to/from [facility] Sit to Stand: Min assist Stand pivot transfers: Min assist General transfer comment: cues for hand placement and safety with standing from bed and from [facility] x 3 with transfer BSC to chair Ambulation/Gait Ambulation/Gait assistance: Min assist Ambulation Distance (Feet): 3 Feet Assistive device: Rolling walker (2 wheeled) Gait Pattern/deviations: Shuffle;Wide base of support Gait velocity interpretation: <1.8 ft/sec, indicative of risk for recurrent falls General Gait Details: cueing for safety, position in RW and posture    Exercises General Exercises - Lower Extremity Long Arc Quad: AROM;Both;5 reps;Seated   PT Diagnosis: Difficulty walking;Generalized weakness  PT Problem List: Decreased strength;Decreased activity tolerance;Decreased mobility;Decreased balance;Decreased knowledge of use of DME;Decreased safety awareness;Decreased range of motion;Decreased skin integrity;Obesity PT Treatment Interventions: DME instruction;Gait training;Functional mobility training;Patient/family education;Therapeutic activities;Therapeutic exercise     PT Goals(Current goals can be found in the care plan section) Acute Rehab PT Goals Patient Stated Goal: be able to walk and return home PT Goal Formulation: With patient/family Time For Goal Achievement: 11/28/13 Potential to Achieve Goals: Fair  Visit Information  Last PT Received On: 11/14/13 Assistance Needed: +1 History of Present Illness: 63 year old obese female with history of HTN, GERD, depression, obesity and lumbar  spondylosis status post lumbar PLIF surgery in January 2015.  Just discharged 11/04/13 after treatment for c.diff colitis and salmonella  enterocolitis. Hospitalization complicated by encephalopathy and acute renal failure (peak creatinine 3.03). Admitted 2/25 with respiratory failure though due to combination PNA and HF. Pt [facility] after [facility]IF was home 1wk with falls and difficulty when she was readmitted last time then return to [facility] at that DC       Prior [place] expects to be discharged to:: Skilled nursing facility Prior Function Level of Independence: Needs assistance Gait / Transfers Assistance Needed: since DC after PLIF needs assist for transfers and gait min assist. Gait has been limited to 44' with RW  ADL's / Homemaking Assistance Needed: assist for all ADLs Communication Communication: Expressive difficulties (very soft spoken after ETT)    Cognition  Cognition Arousal/Alertness: Awake/alert Behavior During Therapy: Flat affect Overall Cognitive Status: Within Functional Limits for tasks assessed    Extremity/Trunk Assessment Upper Extremity Assessment Upper Extremity Assessment: Generalized weakness Lower Extremity Assessment Lower Extremity Assessment: Generalized weakness (ROM limited by body habitus) Cervical / Trunk Assessment Cervical / Trunk Assessment: Normal   Balance Balance Sitting balance-Leahy Scale: Good  End of Session PT - End of Session Equipment Utilized During Treatment: Gait belt Activity Tolerance: Patient tolerated treatment well Patient left: in chair;with call bell/phone within reach;with nursing/sitter in room;with family/visitor present Nurse Communication: Mobility status  GP     Lanetta Inch Beth 11/14/2013, 1:14 PM Elwyn Reach, [place]

## 2013-11-15 LAB — BASIC METABOLIC PANEL
BUN: 14 mg/dL (ref 6–23)
CO2: 22 mEq/L (ref 19–32)
Calcium: 8.3 mg/dL — ABNORMAL LOW (ref 8.4–10.5)
Chloride: 106 mEq/L (ref 96–112)
Creatinine, Ser: 1.11 mg/dL — ABNORMAL HIGH (ref 0.50–1.10)
GFR, EST AFRICAN AMERICAN: 60 mL/min — AB (ref >90)
GFR, EST NON AFRICAN AMERICAN: 52 mL/min — AB (ref >90)
GLUCOSE: 82 mg/dL (ref 70–99)
POTASSIUM: 2.7 meq/L — AB (ref 3.7–5.3)
Sodium: 147 mEq/L (ref 137–147)

## 2013-11-15 LAB — GLUCOSE, CAPILLARY: GLUCOSE-CAPILLARY: 91 mg/dL (ref 70–99)

## 2013-11-15 LAB — CBC
HCT: 22.5 % — ABNORMAL LOW (ref 36.0–46.0)
HEMOGLOBIN: 7.7 g/dL — AB (ref 12.0–15.0)
MCH: 29.6 pg (ref 26.0–34.0)
MCHC: 34.2 g/dL (ref 30.0–36.0)
MCV: 86.5 fL (ref 78.0–100.0)
Platelets: 185 10*3/uL (ref 150–400)
RBC: 2.6 MIL/uL — AB (ref 3.87–5.11)
RDW: 18.2 % — ABNORMAL HIGH (ref 11.5–15.5)
WBC: 6.4 10*3/uL (ref 4.0–10.5)

## 2013-11-15 LAB — MAGNESIUM: MAGNESIUM: 1.4 mg/dL — AB (ref 1.5–2.5)

## 2013-11-15 MED ORDER — PIPERACILLIN-TAZOBACTAM 3.375 G IVPB
3.3750 g | Freq: Three times a day (TID) | INTRAVENOUS | Status: DC
  Administered 2013-11-15 – 2013-11-17 (×6): 3.375 g via INTRAVENOUS
  Filled 2013-11-15 (×8): qty 50

## 2013-11-15 MED ORDER — DIGOXIN 0.25 MG/ML IJ SOLN: 0.1250 mg | Freq: Every day | INTRAMUSCULAR | Status: DC

## 2013-11-15 MED ORDER — POTASSIUM CHLORIDE 10 MEQ/100ML IV SOLN
10.0000 meq | INTRAVENOUS | Status: AC
  Administered 2013-11-15 (×2): 10 meq via INTRAVENOUS
  Filled 2013-11-15 (×2): qty 100

## 2013-11-15 MED ORDER — MAGNESIUM SULFATE 50 % IJ SOLN: 1.0000 g | Freq: Once | INTRAMUSCULAR | Status: DC

## 2013-11-15 MED ORDER — MAGNESIUM SULFATE IN D5W 10-5 MG/ML-% IV SOLN
1.0000 g | Freq: Once | INTRAVENOUS | Status: AC
  Administered 2013-11-15: 1 g via INTRAVENOUS
  Filled 2013-11-15: qty 100

## 2013-11-15 MED ORDER — DIGOXIN 0.25 MG/ML IJ SOLN: 0.2500 mg | Freq: Once | INTRAMUSCULAR | Status: DC

## 2013-11-15 MED ORDER — POTASSIUM CHLORIDE 10 MEQ/100ML IV SOLN
10.0000 meq | INTRAVENOUS | Status: AC
  Administered 2013-11-15 (×4): 10 meq via INTRAVENOUS
  Filled 2013-11-15 (×2): qty 100

## 2013-11-15 NOTE — Progress Notes (Addendum)
Speech Language Pathology Treatment: Dysphagia  Patient Details Name: [name] MRN: [id number] DOB: 1951-09-02 Today's Date: 11/15/2013 Time: 1600-1630 SLP Time Calculation (min): 30 min  Assessment / Plan / Recommendation Clinical Impression  F/u to reassess swallow to determine PO readiness.  Initial BSE 3/6 with diet rec's of puree/nectar thick liquids.  ST treatment completed on 3/7 with recommendations for NPO with exception of ice chips and medication administered in puree.    Nursing reported patient with increased difficulty swallowing medication administered in puree and ice chips with noted increased coughing 3/8 in am.   Patient observed this date with ice chips x3 with functional oral phase.  Wet, hoarse vocal quality prior to PO trials.  Moderately after swallow increased wet productive coughing/gagging requiring oral suction.  Due to s/s present with increased difficulty protecting airway with  even ice chips and limited amounts of puree consistency recommend NPO status.  ST to f/u bedside to assess PO readiness vs. completion of objective evaluation of FEES.   HPI HPI: 63 year old obese female with history of HTN, GERD, depression, obesity and lumbar spondylosis status post lumbar decompression surgery in January 2015.  Just discharged 11/04/13 after treatment for c.diff colitis and salmonella enterocolitis. Hospitalization complicated by encephalopathy and acute renal failure (peak creatinine 3.03). Admitted 2/25 with respiratory failure though due to combination PNA and HF.       SLP Plan  New goals to be determined pending instrumental study    Recommendations Diet recommendations: NPO Medication Administration: Via alternative means              Oral Care Recommendations: Oral care Q4 per protocol Follow up Recommendations: Outpatient SLP Plan: New goals to be determined pending instrumental study    [place], [facility] 11/15/2013,  5:08 PM

## 2013-11-15 NOTE — Progress Notes (Signed)
Patient having difficulty swallowing. She attempts to take applesauce with medications, but gags and does not seem to get the small bolus back to her soft palate before she begins To vomit and have dry heaves. After several attempts, she required oral suctioning to clear secretions (thick white sputum). Prior to attempt, her voice was hoarse, soft, and moist. After the attempt, she was congested for several minutes. Vital signs were relatively stable. Patient's voice cleared after several minutes.  MD notified.  Will speak with SLP this afternoon.  Will keep patient NPO with frequent mouth care as per MD>

## 2013-11-15 NOTE — Progress Notes (Addendum)
[facility] PROGRESS NOTE  JOLAINE FRYBERGER UEA:[id number] DOB: [birth year] DOA: 11/29/2013 PCP: [name], MD  Interim summary:  63 year old obese female with history of hypertension, hypothyroidism and psychosis who was discharged on 2/25 after hospitalization for C. difficile colitis and we admitted on 3/1 for acute respiratory failure secondary to pneumonia.  She was admitted to the critical care service and intubated. Echocardiogram checked and found to be unremarkable. Patient started on broad-spectrum antibiotics. By 3/5, she was able to be extubated and transferred to the hospitalist service in the step down unit starting 3/7. Trace pitting by speech therapy who initially recommended downgrading her diet to dysphagia with nectar thick liquids, however followup evaluation in 3/7 noted further downgraded to n.p.o. with meds only. Patient has been getting slightly more concentrated with increase in her sodium which was 150 by 3/7 and started on gentle half normal saline. C. difficile cultures repeated and patient still positive. Still having some diarrhea.   Assessment/Plan:   Acute respiratory failure: Secondary to aspiration pneumonia. He improved.    Upper GI bleed: Coffee ground material seen at time of intubation and also in NG tube canister following intubation on 3/3. Gastroenterology consulted. The following day, no further coffee ground material seen. But possibly some minor erosive esophagitis or gastritis. On PPI. Hemoglobin has remained stable.    Aspiration Pneumonia: After initial swallow evaluation on 3/6, diet restricted. Followup on 3/7 showed worsening aspiration so patient downgraded to n.p.o. status. Hopefully some of this may be from cognitive limitations because of not being on her home psychiatry medications. Continue to evaluate and if no improvement in the next few days, will need to consider alternative options such as feeding tube. Repeat chest x-ray  tomorrow.  Dysphagia: See below.  Depression Lexapro restarted.  Hypothyroidism: Synthroid changed to by mouth.  Hyperlipidemia: Stable. Statin we started  Hypertension: Stable.  History of C. difficile colitis: Stable. Completed course of antibiotics. Repeat culture 3/7 positive. Restarted IV Flagyl and by mouth vancomycin  History of psychosis: Likely being off of Seroquel, Lamictal and Lexapro for almost one week may be contributing factor to decreased mentation and worsening swallowing. Meds restarted 3/6 evening.  Hypernatremia: Change IV fluids to half-normal saline. Sodium better today at 147.  Hypokalemia: Replacing. Magnesium level also low. Hypomagnesemia: Replaced.  Code Status: Full code Family Communication: Sister at the bedside  Disposition Plan: Unclear. Physical therapy recommending skilled nursing. Hopefully patient will eventually be able to tolerate by mouth eventually. transferred to the floor today.   Consultants:  Critical care  Speech therapy  Nutrition  Gastroenterology  Procedures:  Intubation 3/2-3/5  NG tube 3/2-3/5  Right IJ central line 3/1-3/6  Antibiotics:  IV Zosyn 3/1-3/6, restarted 3/8  IV Flagyl, by mouth vancomycin started 3/7  HPI/Subjective: Patient doing okay. Interactive. Still some coughing and labored breathing  Objective: Filed Vitals:   11/15/13 1500  BP: 132/67  Pulse: 97  Temp: 98.5 F (36.9 C)  Resp: 21    Intake/Output Summary (Last 24 hours) at 11/15/13 1604 Last data filed at 11/15/13 0100  Gross per 24 hour  Intake    665 ml  Output    175 ml  Net    490 ml   Filed Weights   11/13/13 2338 11/14/13 0500 11/15/13 0500  Weight: 120.1 kg (264 lb 12.4 oz) 120.1 kg (264 lb 12.4 oz) 120.9 kg (266 lb 8.6 oz)    Exam:   General:  Alert and oriented x2  Cardiovascular: Regular  rate and rhythm, S1-S2  Respiratory: Mostly clear, few scattered rhonchi  Abdomen: Soft, nontender, nondistended,  positive bowel sounds  Musculoskeletal: No clubbing or cyanosis, trace edema   Data Reviewed: Basic Metabolic Panel:  Recent Labs Lab 11/11/13 0407 11/12/13 0415 11/13/13 1122 11/14/13 0250 11/15/13 0400 11/15/13 0750  NA 146 144 149* 150* 147  --   K 3.9 3.7 3.6* 3.1* 2.7*  --   CL 107 105 107 108 106  --   CO2 27 24 19  16* 22  --   GLUCOSE 85 99 94 93 82  --   BUN 15 16 16 15 14   --   CREATININE 1.15* 1.22* 1.25* 1.27* 1.11*  --   CALCIUM 8.4 8.3* 8.3* 8.4 8.3*  --   MG  --   --   --   --   --  1.4*   Liver Function Tests: No results found for this basename: AST, ALT, ALKPHOS, BILITOT, PROT, ALBUMIN,  in the last 168 hours No results found for this basename: LIPASE, AMYLASE,  in the last 168 hours No results found for this basename: AMMONIA,  in the last 168 hours CBC:  Recent Labs Lab 11/11/13 0407 11/12/13 0415 11/13/13 1122 11/14/13 0250 11/15/13 0400  WBC 5.8 5.1 5.1 7.3 6.4  HGB 8.0* 8.4* 8.5* 8.0* 7.7*  HCT 23.3* 25.1* 25.3* 24.1* 22.5*  MCV 86.3 88.4 88.2 88.0 86.5  PLT 237 213 203 188 185   Cardiac Enzymes:  Recent Labs Lab 11-10-13 1944 10-Nov-2013 2227 11/09/13 0014 11/09/13 0510  TROPONINI 0.31* <0.30 <0.30 <0.30   BNP (last 3 results)  Recent Labs  2013/11/10 1512  PROBNP 1572.0*   CBG:  Recent Labs Lab 10-Nov-2013 1858 11/11/13 0350  GLUCAP 112* 85    Recent Results (from the past 240 hour(s))  CULTURE, BLOOD (ROUTINE X 2)     Status: None   Collection Time    10-Nov-2013  3:12 PM      Result Value Ref Range Status   Specimen Description BLOOD RIGHT ARM   Final   Special Requests BOTTLES DRAWN AEROBIC AND ANAEROBIC 5CC   Final   Culture  Setup Time     Final   Value: 10-Nov-2013 20:39     Performed at [facility]   Culture     Final   Value: NO GROWTH 5 DAYS     Performed at [facility]   Report Status 11/14/2013 FINAL   Final  CULTURE, BLOOD (ROUTINE X 2)     Status: None   Collection Time    11/10/13  3:36 PM       Result Value Ref Range Status   Specimen Description BLOOD LEFT HAND   Final   Special Requests BOTTLES DRAWN AEROBIC AND ANAEROBIC [facility] EACH   Final   Culture  Setup Time     Final   Value: November 10, 2013 20:40     Performed at [place]     Final   Value: NO GROWTH 5 DAYS     Performed at [facility]   Report Status 11/14/2013 FINAL   Final  MRSA PCR SCREENING     Status: None   Collection Time    2013-11-10  6:54 PM      Result Value Ref Range Status   MRSA by PCR NEGATIVE  NEGATIVE Final   Comment:            The GeneXpert MRSA Assay (FDA  approved for NASAL specimens     only), is one component of a     comprehensive MRSA colonization     surveillance program. It is not     intended to diagnose MRSA     infection nor to guide or     monitor treatment for     MRSA infections.  URINE CULTURE     Status: None   Collection Time    11/13/2013  9:44 PM      Result Value Ref Range Status   Specimen Description URINE, CATHETERIZED   Final   Special Requests NONE   Final   Culture  Setup Time     Final   Value: 11/09/2013 04:04     Performed at [place]     Final   Value: NO GROWTH     Performed at [facility]   Culture     Final   Value: NO GROWTH     Performed at [facility]   Report Status 11/10/2013 FINAL   Final  CLOSTRIDIUM DIFFICILE BY PCR     Status: Abnormal   Collection Time    11/14/13 12:44 PM      Result Value Ref Range Status   C difficile by pcr POSITIVE (*) NEGATIVE Final   Comment: CRITICAL RESULT CALLED TO, READ BACK BY AND VERIFIED WITH:     Bonne Dolores RN AT [id number] 11/14/13 BY [facility]OOLLENK     Studies: No results found.  Scheduled Meds: . buPROPion  150 mg Oral Daily  . escitalopram  20 mg Oral Daily  . feeding supplement (ENSURE)  1 Container Oral TID BM  . lamoTRIgine  200 mg Oral Daily  . levothyroxine  88 mcg Oral QAC breakfast  . magnesium sulfate 1 - 4 g bolus IVPB   1 g Intravenous Once  . vancomycin  500 mg Oral 4 times per day   And  . metronidazole  500 mg Intravenous 3 times per day  . potassium chloride  10 mEq Intravenous Q1 Hr x 2  . QUEtiapine  200 mg Oral QHS   Continuous Infusions: . sodium chloride 75 mL/hr at 11/14/13 1728  . dextrose Stopped (11/14/13 1612)    Active Problems:   Acute respiratory failure   Upper GI bleed   Acute systolic CHF (congestive heart failure)   Pneumonia    Time spent: 35 minutes    [place] Pager [phone]. If 7PM-7AM, please contact night-coverage at [email or web address], password [facility] 11/15/2013, 4:04 PM  LOS: 7 days

## 2013-11-15 NOTE — Progress Notes (Signed)
ANTIBIOTIC CONSULT NOTE - FOLLOW UP  Pharmacy Consult for Zosyn Indication: aspiration pneumonia  Allergies  Allergen Reactions  . Aspirin Rash    Patient Measurements: Height: 5\' 6"  (167.6 cm) Weight: 266 lb 8.6 oz (120.9 kg) IBW/kg (Calculated) : 59.3  Vital Signs: Temp: 98.5 F (36.9 C) (03/08 1500) Temp src: Axillary (03/08 1500) BP: 132/67 mmHg (03/08 1500) Pulse Rate: 97 (03/08 1500) Intake/Output from previous day: 03/07 0701 - 03/08 0700 In: 915 [I.V.:815; IV Piggyback:100] Out: 627 [Urine:625; Stool:2] Intake/Output from this shift:    Labs:  Recent Labs  11/13/13 1122 11/14/13 0250 11/15/13 0400  WBC 5.1 7.3 6.4  HGB 8.5* 8.0* 7.7*  PLT 203 188 185  CREATININE 1.25* 1.27* 1.11*   Estimated Creatinine Clearance: 69.6 ml/min (by C-G formula based on Cr of 1.11).  Recent Labs  11/13/13 0420  VANCORANDOM 22.5     Microbiology: Recent Results (from the past 720 hour(s))  CLOSTRIDIUM DIFFICILE BY PCR     Status: Abnormal   Collection Time    10/26/13  4:18 AM      Result Value Ref Range Status   C difficile by pcr POSITIVE (*) NEGATIVE Final   Comment: CRITICAL RESULT CALLED TO, READ BACK BY AND VERIFIED WITH:     TRACY RN 11:50 10/26/13 (wilsonm)     Performed at [place]     Status: None   Collection Time    10/26/13  4:18 AM      Result Value Ref Range Status   Specimen Description STOOL   Final   Special Requests NONE   Final   Culture     Final   Value: NO SALMONELLA, SHIGELLA, CAMPYLOBACTER, YERSINIA, OR E.COLI 0157:H7 ISOLATED     Note: REDUCED NORMAL FLORA PRESENT     Performed at [facility]   Report Status 10/30/2013 FINAL   Final  MRSA PCR SCREENING     Status: None   Collection Time    10/27/13  3:56 PM      Result Value Ref Range Status   MRSA by PCR NEGATIVE  NEGATIVE Final   Comment:            The GeneXpert MRSA Assay (FDA     approved for NASAL specimens     only), is one component of a      comprehensive MRSA colonization     surveillance program. It is not     intended to diagnose MRSA     infection nor to guide or     monitor treatment for     MRSA infections.  URINE CULTURE     Status: None   Collection Time    10/28/13 12:00 PM      Result Value Ref Range Status   Specimen Description URINE, CLEAN CATCH   Final   Special Requests NONE   Final   Culture  Setup Time     Final   Value: 10/29/2013 13:59     Performed at [place]     Final   Value: NO GROWTH     Performed at [facility]   Culture     Final   Value: NO GROWTH     Performed at [facility]   Report Status 10/30/2013 FINAL   Final  CULTURE, BLOOD (ROUTINE X 2)     Status: None   Collection Time    12/05/2013  3:12 PM  Result Value Ref Range Status   Specimen Description BLOOD RIGHT ARM   Final   Special Requests BOTTLES DRAWN AEROBIC AND ANAEROBIC 5CC   Final   Culture  Setup Time     Final   Value: 12/08/2013 20:39     Performed at [facility]   Culture     Final   Value: NO GROWTH 5 DAYS     Performed at [facility]   Report Status 11/14/2013 FINAL   Final  CULTURE, BLOOD (ROUTINE X 2)     Status: None   Collection Time    12/08/2013  3:36 PM      Result Value Ref Range Status   Specimen Description BLOOD LEFT HAND   Final   Special Requests BOTTLES DRAWN AEROBIC AND ANAEROBIC [facility] EACH   Final   Culture  Setup Time     Final   Value: 11/28/2013 20:40     Performed at [facility]   Culture     Final   Value: NO GROWTH 5 DAYS     Performed at [facility]   Report Status 11/14/2013 FINAL   Final  MRSA PCR SCREENING     Status: None   Collection Time    11/11/2013  6:54 PM      Result Value Ref Range Status   MRSA by PCR NEGATIVE  NEGATIVE Final   Comment:            The GeneXpert MRSA Assay (FDA     approved for NASAL specimens     only), is one component of a     comprehensive MRSA colonization      surveillance program. It is not     intended to diagnose MRSA     infection nor to guide or     monitor treatment for     MRSA infections.  URINE CULTURE     Status: None   Collection Time    11/23/2013  9:44 PM      Result Value Ref Range Status   Specimen Description URINE, CATHETERIZED   Final   Special Requests NONE   Final   Culture  Setup Time     Final   Value: 11/09/2013 04:04     Performed at [place]     Final   Value: NO GROWTH     Performed at [facility]   Culture     Final   Value: NO GROWTH     Performed at [facility]   Report Status 11/10/2013 FINAL   Final  CLOSTRIDIUM DIFFICILE BY PCR     Status: Abnormal   Collection Time    11/14/13 12:44 PM      Result Value Ref Range Status   C difficile by pcr POSITIVE (*) NEGATIVE Final   Comment: CRITICAL RESULT CALLED TO, READ BACK BY AND VERIFIED WITH:     Bonne Dolores RN AT [id number] 11/14/13 BY [facility]    Anti-infectives   Start     Dose/Rate Route Frequency Ordered Stop   11/14/13 2000  metroNIDAZOLE (FLAGYL) IVPB 500 mg     500 mg 100 mL/hr over 60 Minutes Intravenous 3 times per day 11/14/13 1854 11/28/13 2159   11/14/13 1945  vancomycin (VANCOCIN) 50 mg/mL oral solution 500 mg     500 mg Oral 4 times per day 11/14/13 1854 11/28/13 1759   11/09/13 0500  vancomycin (VANCOCIN) IVPB 1000  mg/200 mL premix  Status:  Discontinued     1,000 mg 200 mL/hr over 60 Minutes Intravenous Every 12 hours 11/30/2013 1743 11/12/13 0506   11/09/13 0100  piperacillin-tazobactam (ZOSYN) IVPB 3.375 g  Status:  Discontinued     3.375 g 12.5 mL/hr over 240 Minutes Intravenous 3 times per day 11/11/2013 1743 11/13/13 1049   11/19/2013 1600  vancomycin (VANCOCIN) 2,268 mg in sodium chloride 0.9 % 500 mL IVPB  Status:  Discontinued     20 mg/kg  113.4 kg 250 mL/hr over 120 Minutes Intravenous  Once 11/14/2013 1548 11/28/2013 1549   11/24/2013 1600  piperacillin-tazobactam (ZOSYN) IVPB 3.375 g      3.375 g 100 mL/hr over 30 Minutes Intravenous  Once 12/08/2013 1548 11/12/13 1200   12/06/2013 1600  vancomycin (VANCOCIN) 2,000 mg in sodium chloride 0.9 % 500 mL IVPB     2,000 mg 250 mL/hr over 120 Minutes Intravenous  Once 11/13/2013 1550 11/12/13 1200      Assessment: 63 year old female who received 5 days of Vanc/Zosyn earlier this admission for aspiration pneumonia, now to restart Zosyn to continue therapy for aspiration pneumonia. Note she also has a history of clostridium difficile and is PCR positive again this admission. She is on PO Vancomycin and Flagyl per Dr. [place] Pink.   Plan:  Zosyn 3.375gm IV q8h extended infusion Recommend limiting duration of antibiotic therapy given concomitant Clostridium difficile.  Legrand Como, Pharm.D., BCPS, AAHIVP Clinical Pharmacist Phone: [phone] or [phone] 11/15/2013, 4:15 PM

## 2013-11-15 NOTE — Progress Notes (Deleted)
Patient having difficulty swallowing.  She attempts to take applesauce with medications, but gags and does not seem to get the small bolus back to her soft palate before she begins  To vomit and have dry heaves.  After several attempts, she required oral suctioning to clear secretions (thick white sputum).  Prior to attempt, her voice was hoarse, soft, and moist.  After the attempt, she was congested for several minutes.  Vital signs were relatively stable.  Patient's voice cleared after several minutes.

## 2013-11-16 ENCOUNTER — Inpatient Hospital Stay (HOSPITAL_COMMUNITY): Payer: Medicare Other

## 2013-11-16 ENCOUNTER — Telehealth: Payer: Self-pay | Admitting: Internal Medicine

## 2013-11-16 DIAGNOSIS — G250 Essential tremor: Secondary | ICD-10-CM

## 2013-11-16 DIAGNOSIS — E876 Hypokalemia: Secondary | ICD-10-CM

## 2013-11-16 DIAGNOSIS — G252 Other specified forms of tremor: Secondary | ICD-10-CM

## 2013-11-16 LAB — GLUCOSE, CAPILLARY
GLUCOSE-CAPILLARY: 97 mg/dL (ref 70–99)
GLUCOSE-CAPILLARY: 79 mg/dL (ref 70–99)
GLUCOSE-CAPILLARY: 80 mg/dL (ref 70–99)
Glucose-Capillary: 78 mg/dL (ref 70–99)
Glucose-Capillary: 91 mg/dL (ref 70–99)
Glucose-Capillary: 99 mg/dL (ref 70–99)

## 2013-11-16 LAB — BASIC METABOLIC PANEL
BUN: 11 mg/dL (ref 6–23)
CHLORIDE: 109 meq/L (ref 96–112)
CO2: 24 meq/L (ref 19–32)
Calcium: 8.2 mg/dL — ABNORMAL LOW (ref 8.4–10.5)
Creatinine, Ser: 1.04 mg/dL (ref 0.50–1.10)
GFR calc Af Amer: 65 mL/min — ABNORMAL LOW (ref >90)
GFR calc non Af Amer: 56 mL/min — ABNORMAL LOW (ref >90)
Glucose, Bld: 90 mg/dL (ref 70–99)
Potassium: 3 mEq/L — ABNORMAL LOW (ref 3.7–5.3)
SODIUM: 149 meq/L — AB (ref 137–147)

## 2013-11-16 LAB — LIPASE, BLOOD: Lipase: 47 U/L (ref 11–59)

## 2013-11-16 MED ORDER — METOCLOPRAMIDE HCL 5 MG/ML IJ SOLN
5.0000 mg | Freq: Four times a day (QID) | INTRAMUSCULAR | Status: DC
  Administered 2013-11-16: 18:00:00 via INTRAVENOUS
  Administered 2013-11-17 (×2): 5 mg via INTRAVENOUS
  Filled 2013-11-16 (×4): qty 1
  Filled 2013-11-16: qty 2
  Filled 2013-11-16: qty 1

## 2013-11-16 MED ORDER — POTASSIUM CHLORIDE 10 MEQ/100ML IV SOLN
10.0000 meq | INTRAVENOUS | Status: AC
  Administered 2013-11-16 – 2013-11-17 (×4): 10 meq via INTRAVENOUS
  Filled 2013-11-16 (×4): qty 100

## 2013-11-16 NOTE — Progress Notes (Signed)
Speech Language Pathology Treatment: Dysphagia  Patient Details Name: [name] MRN: [id number] DOB: 27-Apr-1951 Today's Date: 11/16/2013 Time: 1000-1026 SLP Time Calculation (min): 26 min  Assessment / Plan / Recommendation Clinical Impression  Patient with significant change with inability to keep anything down after swallowing.  Immediate vomiting of green-yellow bile after 1 ice chip and 1/2 teaspoon of water.  Per nursing notes, pt. Has been unable to take any meds.  This is significantly different than when seen by this SLP on 11/13/13.  Pt's voice remains hoarse with intermittent dysphonia,   Recommend f/u with GI.  Will d/c ST until GI issues have been resolved.  If oropharyngeal dysphagia continues to be an issue, please reorder ST.   HPI HPI: 63 year old obese female with history of HTN, GERD, depression, obesity and lumbar spondylosis status post lumbar decompression surgery in January 2015.  Just discharged 11/04/13 after treatment for c.diff colitis and salmonella enterocolitis. Hospitalization complicated by encephalopathy and acute renal failure (peak creatinine 3.03). Admitted 2/25 with respiratory failure though due to combination PNA and HF.    Pertinent Vitals Afebrile; LS clear/diminished  SLP Plan  Consult other service (comment) (GI)    Recommendations Diet recommendations: NPO              Oral Care Recommendations: Oral care Q4 per protocol Follow up Recommendations: 24 hour supervision/assistance Plan: Consult other service (comment) (GI)    GO     Quinn Axe T, CCC-SLP 11/16/2013, 10:26 AM

## 2013-11-16 NOTE — Progress Notes (Signed)
[facility] PROGRESS NOTE  KYRIAKI MODER WUX:[id number] DOB: 06/24/1951 DOA: 11/21/2013 PCP: [name], MD  Interim summary:  63 year old obese female with history of hypertension, hypothyroidism and psychosis who was discharged on 2/25 after hospitalization for C. difficile colitis and we admitted on 3/1 for acute respiratory failure secondary to pneumonia.  She was admitted to the critical care service and intubated. Echocardiogram checked and found to be unremarkable. Patient started on broad-spectrum antibiotics. By 3/5, she was able to be extubated and transferred to the hospitalist service in the step down unit starting 3/7. Trace pitting by speech therapy who initially recommended downgrading her diet to dysphagia with nectar thick liquids, however followup evaluation in 3/7 noted further downgraded to n.p.o. with meds only. Patient has been getting slightly more concentrated with increase in her sodium which was 150 by 3/7 and started on gentle half normal saline. C. difficile cultures repeated and patient still positive. Still having some diarrhea.   Assessment/Plan: 1. Acute respiratory failure: Secondary to aspiration pneumonia.  -clinically improved, continue abx  2.  Upper GI bleed: Coffee ground material seen at time of intubation and also in NG tube canister following intubation on 3/3. Gastroenterology consulted. The following day, no further coffee ground material seen. But possibly some minor erosive esophagitis or gastritis. On PPI. Hemoglobin has remained stable.   3. Aspiration Pneumonia: After initial swallow evaluation on 3/6, diet restricted. Followup on 3/7 showed worsening aspiration so patient downgraded to n.p.o. Status. -repeat xray with significant improvement of infiltrates 4.Dysphagia:  -ST today recommending GI given her increased N/V -noted that pt was seen by GI this hospital stay for ? Hematemesis and signed off -see below for eval of  n/v 5.Depression  -continue Lexapro   6.Hypothyroidism:  -Synthroid changed to by mouth.  7.Hyperlipidemia: Stable. Statin we started  8.Hypertension: Stable.  9.History of C. difficile colitis: Stable. Completed course of antibiotics. Repeat culture 3/7 positive. -continue IV Flagyl and by mouth vancomycin  10.History of psychosis: Likely being off of Seroquel, Lamictal and Lexapro for almost one week may be contributing factor to decreased mentation and worsening swallowing. - Meds restarted 3/6 evening- continue.  11.Hypernatremia: - Change IV fluids to back d5W. Sodium up to 149 today.  12.Hypokalemia -replace k -Magnesium was replaced. 13.Hypomagnesemia:  Repleted 3/8, will recheck. 14.Nausea vomiting -will obtain lipase, abd xray(pt also with some abd pain) -place on reglan and follow and if not improving will consult GI pending studies  Code Status: Full code Family Communication: Sister at the bedside  Disposition Plan: Unclear. Physical therapy recommending skilled nursing.    Consultants:  Critical care  Speech therapy  Nutrition  Gastroenterology  Procedures:  Intubation 3/2-3/5  NG tube 3/2-3/5  Right IJ central line 3/1-3/6  Antibiotics:  IV Zosyn 3/1-3/6, restarted 3/8  IV Flagyl, by mouth vancomycin started 3/7  HPI/Subjective: Pt with nausea and vomiting this am  Objective: Filed Vitals:   11/16/13 1139  BP: 148/83  Pulse: 89  Temp: 98.8 F (37.1 C)  Resp: 20    Intake/Output Summary (Last 24 hours) at 11/16/13 1456 Last data filed at 11/16/13 0530  Gross per 24 hour  Intake   2300 ml  Output    552 ml  Net   1748 ml   Filed Weights   11/15/13 0500 11/15/13 2023 11/16/13 0317  Weight: 120.9 kg (266 lb 8.6 oz) 120.2 kg (264 lb 15.9 oz) 120.2 kg (264 lb 15.9 oz)    Exam:   General:  Alert and oriented x2  Cardiovascular: Regular rate and rhythm, S1-S2  Respiratory: coarse BS and scatteredrhonchi  Abdomen: Soft,  nontender, nondistended, positive bowel sounds  Musculoskeletal: No clubbing or cyanosis, trace edema   Data Reviewed: Basic Metabolic Panel:  Recent Labs Lab 11/12/13 0415 11/13/13 1122 11/14/13 0250 11/15/13 0400 11/15/13 0750 11/16/13 0644  NA 144 149* 150* 147  --  149*  K 3.7 3.6* 3.1* 2.7*  --  3.0*  CL 105 107 108 106  --  109  CO2 24 19 16* 22  --  24  GLUCOSE 99 94 93 82  --  90  BUN 16 16 15 14   --  11  CREATININE 1.22* 1.25* 1.27* 1.11*  --  1.04  CALCIUM 8.3* 8.3* 8.4 8.3*  --  8.2*  MG  --   --   --   --  1.4*  --    Liver Function Tests: No results found for this basename: AST, ALT, ALKPHOS, BILITOT, PROT, ALBUMIN,  in the last 168 hours No results found for this basename: LIPASE, AMYLASE,  in the last 168 hours No results found for this basename: AMMONIA,  in the last 168 hours CBC:  Recent Labs Lab 11/11/13 0407 11/12/13 0415 11/13/13 1122 11/14/13 0250 11/15/13 0400  WBC 5.8 5.1 5.1 7.3 6.4  HGB 8.0* 8.4* 8.5* 8.0* 7.7*  HCT 23.3* 25.1* 25.3* 24.1* 22.5*  MCV 86.3 88.4 88.2 88.0 86.5  PLT 237 213 203 188 185   Cardiac Enzymes: No results found for this basename: CKTOTAL, CKMB, CKMBINDEX, TROPONINI,  in the last 168 hours BNP (last 3 results)  Recent Labs  11/17/2013 1512  PROBNP 1572.0*   CBG:  Recent Labs Lab 11/15/13 2020 11/16/13 0017 11/16/13 0436 11/16/13 0806 11/16/13 1137  GLUCAP 91 91 97 79 99    Recent Results (from the past 240 hour(s))  CULTURE, BLOOD (ROUTINE X 2)     Status: None   Collection Time    11/13/2013  3:12 PM      Result Value Ref Range Status   Specimen Description BLOOD RIGHT ARM   Final   Special Requests BOTTLES DRAWN AEROBIC AND ANAEROBIC 5CC   Final   Culture  Setup Time     Final   Value: 11/25/2013 20:39     Performed at [facility]   Culture     Final   Value: NO GROWTH 5 DAYS     Performed at [facility]   Report Status 11/14/2013 FINAL   Final  CULTURE, BLOOD (ROUTINE X 2)      Status: None   Collection Time    11/29/2013  3:36 PM      Result Value Ref Range Status   Specimen Description BLOOD LEFT HAND   Final   Special Requests BOTTLES DRAWN AEROBIC AND ANAEROBIC [facility] EACH   Final   Culture  Setup Time     Final   Value: 11/22/2013 20:40     Performed at [facility]   Culture     Final   Value: NO GROWTH 5 DAYS     Performed at [facility]   Report Status 11/14/2013 FINAL   Final  MRSA PCR SCREENING     Status: None   Collection Time    11/10/2013  6:54 PM      Result Value Ref Range Status   MRSA by PCR NEGATIVE  NEGATIVE Final   Comment:  The GeneXpert MRSA Assay (FDA     approved for NASAL specimens     only), is one component of a     comprehensive MRSA colonization     surveillance program. It is not     intended to diagnose MRSA     infection nor to guide or     monitor treatment for     MRSA infections.  URINE CULTURE     Status: None   Collection Time    11/11/2013  9:44 PM      Result Value Ref Range Status   Specimen Description URINE, CATHETERIZED   Final   Special Requests NONE   Final   Culture  Setup Time     Final   Value: 11/09/2013 04:04     Performed at [place]     Final   Value: NO GROWTH     Performed at [facility]   Culture     Final   Value: NO GROWTH     Performed at [facility]   Report Status 11/10/2013 FINAL   Final  CLOSTRIDIUM DIFFICILE BY PCR     Status: Abnormal   Collection Time    11/14/13 12:44 PM      Result Value Ref Range Status   C difficile by pcr POSITIVE (*) NEGATIVE Final   Comment: CRITICAL RESULT CALLED TO, READ BACK BY AND VERIFIED WITH:     Bonne Dolores RN AT 2979 11/14/13 BY Karie Chimera     Studies: Dg Chest Port 1 View  11/16/2013   CLINICAL DATA:  Pneumonia .  EXAM: PORTABLE CHEST - 1 VIEW  COMPARISON:  DG CHEST 1V PORT dated 11/10/2013  FINDINGS: Interim removal of endotracheal tube, NG tube, and central line. Stable  severe cardiomegaly. Mild pulmonary venous congestion. Diffuse pulmonary interstitial prominence has diminished significantly. These findings are consistent with improving congestive heart failure. No pleural effusion or pneumothorax. No acute osseous abnormality.  IMPRESSION: 1. Interim removal of endotracheal tube, NG tube, and central line. 2. Significant improvement of changes of congestive heart failure.   Electronically Signed   By: Marcello Moores  Register   On: 11/16/2013 07:40    Scheduled Meds: . buPROPion  150 mg Oral Daily  . escitalopram  20 mg Oral Daily  . feeding supplement (ENSURE)  1 Container Oral TID BM  . lamoTRIgine  200 mg Oral Daily  . levothyroxine  88 mcg Oral QAC breakfast  . vancomycin  500 mg Oral 4 times per day   And  . metronidazole  500 mg Intravenous 3 times per day  . piperacillin-tazobactam (ZOSYN)  IV  3.375 g Intravenous Q8H  . QUEtiapine  200 mg Oral QHS   Continuous Infusions: . sodium chloride 1,000 mL (11/16/13 1051)  . dextrose Stopped (11/14/13 1612)    Active Problems:   Acute respiratory failure   Upper GI bleed   Acute systolic CHF (congestive heart failure)   Pneumonia    Time spent: 35 minutes    [facility] Pager [phone]. If 7PM-7AM, please contact night-coverage at [email or web address], password [facility] 11/16/2013, 2:56 PM  LOS: 8 days

## 2013-11-16 NOTE — Telephone Encounter (Signed)
Noted I was aware.  Make sure no No show charges are given to her.

## 2013-11-16 NOTE — Telephone Encounter (Signed)
FYI pt is calling to let MD know she is still in hospital and will make an appt once she is discharged

## 2013-11-16 NOTE — Progress Notes (Signed)
Muliple times I have tried to give the patient medications that have been ordered by mouth to her. Every attempt she takes the medication and begins to gag and then start to throw up. Will notify MD that patient needs to be completely NPO with no medication by mouth at all and to find an alternative route for all oral medications. Will continue to monitor and assess the patient and not give any more medications.   [employer], RN [address] [place] [address] [place]

## 2013-11-16 NOTE — Evaluation (Signed)
Occupational Therapy Evaluation Patient Details Name: ISOBELLA ASCHER MRN: [id number] DOB: 07-08-1951 Today's Date: 11/16/2013 Time: 3086-5784 OT Time Calculation (min): 20 min  OT Assessment / Plan / Recommendation History of present illness 63 year old obese female with history of HTN, GERD, depression, obesity and lumbar spondylosis status post lumbar PLIF surgery in January 2015.  Just discharged 11/04/13 after treatment for c.diff colitis and salmonella enterocolitis. Hospitalization complicated by encephalopathy and acute renal failure (peak creatinine 3.03). Admitted 2/25 with respiratory failure though due to combination PNA and HF. [facility] was home 1wk with falls and difficulty when she was readmitted last time then return to [facility] at that DC   Clinical Impression   Pt demos decline in function with ADLs and ADL mobiloty safety with decreased strength and endurance. Pt would benefit from acute OT services to address impairments to increase level of function and safety. Pt unable to recall how much assistance is required for her at SNF for ADLs and mobility, but states that she plans to return to SNF for rehab and that her family does not want her living alone any longer    OT Assessment  Patient needs continued OT Services    Follow Up Recommendations  SNF;Supervision/Assistance - 24 hour    Barriers to Discharge Decreased caregiver support plans to return to SNF to continue with rehab  Equipment Recommendations  None recommended by OT    Recommendations for Other Services    Frequency  Min 2X/week    Precautions / Restrictions Precautions Precautions: Fall Precaution Comments: pt able to verbalize 3/3 back precautions, required cues to maintain during bed mobility Restrictions Weight Bearing Restrictions: No   Pertinent Vitals/Pain No c/o pain    ADL  Grooming: Performed;Wash/dry hands;Wash/dry face;Supervision/safety;Set up Upper Body  Bathing: Simulated;Supervision/safety;Set up Where Assessed - Upper Body Bathing: Unsupported sitting Lower Body Bathing: Simulated;Moderate assistance Where Assessed - Lower Body Bathing: Unsupported sitting;Lean right and/or left Upper Body Dressing: Performed;Supervision/safety;Set up Where Assessed - Upper Body Dressing: Unsupported sitting Lower Body Dressing: +1 Total assistance Tub/Shower Transfer: +2 Total assistance Transfers/Ambulation Related to ADLs: pt declined transfers due to fatigue. Per PT pt is min A with transfers using RW ADL Comments: reviewed back precautions with pt for ADLs, pt familair with ADL A/E from PLIF 1/15 and verbalized how to use A/E    OT Diagnosis: Generalized weakness  OT Problem List: Decreased strength;Decreased activity tolerance OT Treatment Interventions: Self-care/ADL training;Therapeutic exercise;Patient/family education;Neuromuscular education;Balance training;Therapeutic activities;DME and/or AE instruction   OT Goals(Current goals can be found in the care plan section) Acute Rehab OT Goals Patient Stated Goal: be able to walk and return home OT Goal Formulation: With patient Time For Goal Achievement: 11/23/13 Potential to Achieve Goals: Good ADL Goals Pt Will Perform Grooming: with min assist;with min guard assist;standing Pt Will Perform Lower Body Bathing: with min assist;sitting/lateral leans;sit to/from stand Pt Will Transfer to Toilet: with min guard assist;bedside commode;ambulating;regular height toilet;grab bars Pt Will Perform Toileting - Clothing Manipulation and hygiene: with max assist;with mod assist;sitting/lateral leans;sit to/from stand Additional ADL Goal #1: Pt will complete bed mobility with min guard A - sup to sit EOB in prep for ADLs Additional ADL Goal #2: Pt will verbalize/demo all back precautions for ADLs  Visit Information  Last OT Received On: 11/16/13 Assistance Needed: +1 History of Present Illness:  63 year old obese female with history of HTN, GERD, depression, obesity and lumbar spondylosis status post lumbar PLIF surgery in January 2015.  Just  discharged 11/04/13 after treatment for c.diff colitis and salmonella enterocolitis. Hospitalization complicated by encephalopathy and acute renal failure (peak creatinine 3.03). Admitted 2/25 with respiratory failure though due to combination PNA and HF. Pt [facility] after [facility] was home 1wk with falls and difficulty when she was readmitted last time then return to [facility] at that DC       Prior [place] expects to be discharged to:: Skilled nursing facility Living Arrangements: Alone Additional Comments: plans to return to SNF for rehab Prior Function Level of Independence: Needs assistance Gait / Transfers Assistance Needed: since DC after PLIF needs assist for transfers and gait min assist. Gait has been limited to 1' with RW  ADL's / Homemaking Assistance Needed: assist for all ADLs. Pt states that she does not remember how much assist staff had been givngher at SNF Communication Communication: No difficulties Dominant Hand: Right         Vision/Perception Vision - History Baseline Vision: Wears glasses only for reading Patient Visual Report: No change from baseline Perception Perception: Within Functional Limits   Cognition  Cognition Arousal/Alertness: Awake/alert Behavior During Therapy: WFL for tasks assessed/performed Overall Cognitive Status: Within Functional Limits for tasks assessed Following Commands: Follows one step commands with increased time    Extremity/Trunk Assessment Upper Extremity Assessment Upper Extremity Assessment: Overall WFL for tasks assessed;Generalized weakness Lower Extremity Assessment Lower Extremity Assessment: Defer to PT evaluation     Mobility Bed Mobility Overal bed mobility: Needs Assistance Bed Mobility: Rolling;Sidelying to  Sit Rolling: Min assist Sidelying to sit: Min assist Sit to sidelying: Mod assist General bed mobility comments: cues for sequence and precautions, assist with LEs back onto bed Transfers General transfer comment: pt declined transfers due to fatigue. Per PT pt is min A with transfers using RW          Balance Balance Overall balance assessment: Needs assistance Sitting-balance support: Single extremity supported;Feet supported;Bilateral upper extremity supported Sitting balance-Leahy Scale: Good   End of Session OT - End of Session Activity Tolerance: Patient limited by fatigue Patient left: in bed;with call bell/phone within reach;with bed alarm set  GO     [name] 11/16/2013, 1:30 PM

## 2013-11-17 ENCOUNTER — Encounter (HOSPITAL_COMMUNITY): Payer: Self-pay | Admitting: Physician Assistant

## 2013-11-17 DIAGNOSIS — I10 Essential (primary) hypertension: Secondary | ICD-10-CM

## 2013-11-17 DIAGNOSIS — R4182 Altered mental status, unspecified: Secondary | ICD-10-CM

## 2013-11-17 LAB — BASIC METABOLIC PANEL
BUN: 11 mg/dL (ref 6–23)
CALCIUM: 8.3 mg/dL — AB (ref 8.4–10.5)
CO2: 27 mEq/L (ref 19–32)
Chloride: 107 mEq/L (ref 96–112)
Creatinine, Ser: 1.08 mg/dL (ref 0.50–1.10)
GFR calc Af Amer: 62 mL/min — ABNORMAL LOW (ref >90)
GFR, EST NON AFRICAN AMERICAN: 54 mL/min — AB (ref >90)
GLUCOSE: 113 mg/dL — AB (ref 70–99)
Potassium: 3.2 mEq/L — ABNORMAL LOW (ref 3.7–5.3)
Sodium: 147 mEq/L (ref 137–147)

## 2013-11-17 LAB — GLUCOSE, CAPILLARY
GLUCOSE-CAPILLARY: 106 mg/dL — AB (ref 70–99)
Glucose-Capillary: 81 mg/dL (ref 70–99)
Glucose-Capillary: 88 mg/dL (ref 70–99)
Glucose-Capillary: 97 mg/dL (ref 70–99)
Glucose-Capillary: 99 mg/dL (ref 70–99)

## 2013-11-17 LAB — MAGNESIUM: MAGNESIUM: 1.5 mg/dL (ref 1.5–2.5)

## 2013-11-17 MED ORDER — POTASSIUM CHLORIDE 10 MEQ/100ML IV SOLN
10.0000 meq | INTRAVENOUS | Status: AC
  Administered 2013-11-17 (×4): 10 meq via INTRAVENOUS
  Filled 2013-11-17 (×4): qty 100

## 2013-11-17 MED ORDER — METOCLOPRAMIDE HCL 5 MG/ML IJ SOLN: 10.0000 mg | Freq: Four times a day (QID) | INTRAMUSCULAR | Status: DC

## 2013-11-17 MED ORDER — SODIUM CHLORIDE 0.9 % IV SOLN: INTRAVENOUS | Status: DC

## 2013-11-17 MED ORDER — MAGNESIUM SULFATE 40 MG/ML IJ SOLN
2.0000 g | Freq: Once | INTRAMUSCULAR | Status: AC
  Administered 2013-11-17: 2 g via INTRAVENOUS
  Filled 2013-11-17: qty 50

## 2013-11-17 MED ORDER — FAMOTIDINE 20 MG PO TABS
20.0000 mg | ORAL_TABLET | Freq: Two times a day (BID) | ORAL | Status: DC
  Administered 2013-11-18 – 2013-11-19 (×3): 20 mg via ORAL
  Filled 2013-11-17 (×6): qty 1

## 2013-11-17 NOTE — Telephone Encounter (Signed)
This appointment time was during the inclement weather, no charges were posted for that reason alone.  Thanks

## 2013-11-17 NOTE — Progress Notes (Signed)
Physical Therapy Treatment Patient Details Name: [name] MRN: [id number] DOB: 25-Nov-1950 Today's Date: 11/17/2013 Time: 0932-3557 PT Time Calculation (min): 28 min  PT Assessment / Plan / Recommendation  History of Present Illness 63 year old obese female with history of HTN, GERD, depression, obesity and lumbar spondylosis status post lumbar PLIF surgery in January 2015.  Just discharged 11/04/13 after treatment for c.diff colitis and salmonella enterocolitis. Hospitalization complicated by encephalopathy and acute renal failure (peak creatinine 3.03). Admitted 2/25 with respiratory failure though due to combination PNA and HF. Pt [facility] was home 1wk with falls and difficulty when she was readmitted last time then return to [facility] at that DC   PT Comments   Patient able to walk in room and transfer to [facility] with walker.  Nurse tech reported pt having difficulty rolling in bed, so informed her how to assist pt up to [facility] and walker left in the room.  Will need SNF level rehab at d/c.  Follow Up Recommendations  SNF     Does the patient have the potential to tolerate intense rehabilitation   N/A  Barriers to Discharge  None      Equipment Recommendations  None recommended by PT    Recommendations for Other Services  None  Frequency Min 2X/week   Progress towards PT Goals Progress towards PT goals: Progressing toward goals  Plan Current plan remains appropriate    Precautions / Restrictions Precautions Precautions: Fall;Back   Pertinent Vitals/Pain No pain complaints    Mobility  Bed Mobility Overal bed mobility: Needs Assistance Bed Mobility: Rolling;Sidelying to Sit Rolling: Supervision Sidelying to sit: Min assist;HOB elevated General bed mobility comments: use of rail to pull up to sit Transfers Overall transfer level: Needs assistance Equipment used: Rolling walker (2 wheeled) Sit to Stand: Mod assist General transfer comment: lifting  assist needed to stand from bed and BSC, sits at times uncontrolled Ambulation/Gait Ambulation/Gait assistance: Min assist Ambulation Distance (Feet): 12 Feet Assistive device: Rolling walker (2 wheeled) Gait Pattern/deviations: Shuffle;Decreased stride length;Wide base of support General Gait Details: assist to maneuver walker around furniture in the room and for support for safety      PT Goals (current goals can now be found in the care plan section)    Visit Information  Last PT Received On: 11/17/13 Assistance Needed: +1 History of Present Illness: 63 year old obese female with history of HTN, GERD, depression, obesity and lumbar spondylosis status post lumbar PLIF surgery in January 2015.  Just discharged 11/04/13 after treatment for c.diff colitis and salmonella enterocolitis. Hospitalization complicated by encephalopathy and acute renal failure (peak creatinine 3.03). Admitted 2/25 with respiratory failure though due to combination PNA and HF. Pt [facility] was home 1wk with falls and difficulty when she was readmitted last time then return to [facility] at that DC    Subjective Data   They haven't had the equipment in here till now.   Cognition  Cognition Arousal/Alertness: Awake/alert Behavior During Therapy: WFL for tasks assessed/performed Overall Cognitive Status: Within Functional Limits for tasks assessed    Balance  Balance Overall balance assessment: Needs assistance Standing balance-Leahy Scale: Poor Standing balance comment: UE support needed for safety; stood for hygiene after toileting with supervision with UE support for 2 minutes  End of Session PT - End of Session Equipment Utilized During Treatment: Gait belt Activity Tolerance: Patient limited by fatigue Patient left: in chair;with call bell/phone within reach;with nursing/sitter in room   GP  [name],[name] 11/17/2013, 4:15 PM Magda Kiel, [place] 11/17/2013

## 2013-11-17 NOTE — Progress Notes (Signed)
[facility] PROGRESS NOTE  [name] YCX:[id number] DOB: [birth year] DOA: 11/30/13 PCP: [name], MD  Interim summary:  63 year old obese female with history of hypertension, hypothyroidism and psychosis who was discharged on 2/25 after hospitalization for C. difficile colitis and we admitted on 3/1 for acute respiratory failure secondary to pneumonia.  She was admitted to the critical care service and intubated. Echocardiogram checked and found to be unremarkable. Patient started on broad-spectrum antibiotics. By 3/5, she was able to be extubated and transferred to the hospitalist service in the step down unit starting 3/7. Trace pitting by speech therapy who initially recommended downgrading her diet to dysphagia with nectar thick liquids, however followup evaluation in 3/7 noted further downgraded to n.p.o. with meds only. Patient has been getting slightly more concentrated with increase in her sodium which was 150 by 3/7 and started on gentle half normal saline. C. difficile cultures repeated and patient still positive. Still having some diarrhea.   Assessment/Plan: 1. Acute respiratory failure: Secondary to aspiration pneumonia.  -clinically improved, continue abx  2.  Upper GI bleed: Coffee ground material seen at time of intubation and also in NG tube canister following intubation on 3/3. Gastroenterology consulted. The following day, no further coffee ground material seen. But possibly some minor erosive esophagitis or gastritis. On PPI. Hemoglobin has remained stable, follow and recheck.   3. Aspiration Pneumonia: After initial swallow evaluation on 3/6, diet restricted. Followup on 3/7 showed worsening aspiration so patient downgraded to n.p.o. Status. -repeat xray 3/9 with significant improvement of infiltrates  4.Dysphagia:  -ST today recommending GI given her increased N/V -noted that pt was seen by GI this hospital stay for ? Hematemesis and signed off -GI  consulted as discussed below for further recs  5.Depression  -continue Lexapro   6.Hypothyroidism:  -Synthroid changed to by mouth.  7.Hyperlipidemia: Stable. Statin we started  8.Hypertension: Stable.  9.History of C. difficile colitis: Stable. Completed course of antibiotics. Repeat culture 3/7 positive. -continue IV Flagyl and by mouth vancomycin  10.History of psychosis: Likely being off of Seroquel, Lamictal and Lexapro for almost one week may be contributing factor to decreased mentation and worsening swallowing. - Meds restarted 3/6 evening- continue.  11.Hypernatremia: - Changed IV fluids to back d5W on 3/9. Sodium improved to 147, follow and recheck  12.Hypokalemia -replace k again -Magnesium was replaced.  13.Hypomagnesemia:  Replete mg again and follow  14.Nausea vomiting -lipase wnl, abd xray neg -increase reglan>> monitor and if any increase in diarrhea dose will need to be decreased or d/ced as clinically appropriate -reconsulted GI today for further recommendations  Code Status: Full code Family Communication: Sister at the bedside  Disposition Plan: Unclear. Physical therapy recommending skilled nursing.    Consultants:  Critical care  Speech therapy  Nutrition  Gastroenterology  Procedures:  Intubation 3/2-3/5  NG tube 3/2-3/5  Right IJ central line 3/1-3/6  Antibiotics:  IV Zosyn 3/1-3/6, restarted 3/8  IV Flagyl, by mouth vancomycin started 3/7  HPI/Subjective: Still with nausea and vomiting this am, +diarrhea  Objective: Filed Vitals:   11/17/13 1129  BP: 136/75  Pulse: 86  Temp: 99.1 F (37.3 C)  Resp: 18    Intake/Output Summary (Last 24 hours) at 11/17/13 1341 Last data filed at 11/17/13 0029  Gross per 24 hour  Intake      0 ml  Output    450 ml  Net   -450 ml   Filed Weights   11/15/13 2023 11/16/13 0317 11/16/13 2026  Weight: 120.2 kg (264 lb 15.9 oz) 120.2 kg (264 lb 15.9 oz) 120.294 kg (265 lb 3.2 oz)     Exam:   General:  Alert and oriented x2  Cardiovascular: Regular rate and rhythm, S1-S2  Respiratory: coarse BS and scatteredrhonchi  Abdomen: Soft, nontender, nondistended, positive bowel sounds  Musculoskeletal: No clubbing or cyanosis, trace edema   Data Reviewed: Basic Metabolic Panel:  Recent Labs Lab 11/13/13 1122 11/14/13 0250 11/15/13 0400 11/15/13 0750 11/16/13 0644 11/17/13 0823  NA 149* 150* 147  --  149* 147  K 3.6* 3.1* 2.7*  --  3.0* 3.2*  CL 107 108 106  --  109 107  CO2 19 16* 22  --  24 27  GLUCOSE 94 93 82  --  90 113*  BUN 16 15 14   --  11 11  CREATININE 1.25* 1.27* 1.11*  --  1.04 1.08  CALCIUM 8.3* 8.4 8.3*  --  8.2* 8.3*  MG  --   --   --  1.4*  --  1.5   Liver Function Tests: No results found for this basename: AST, ALT, ALKPHOS, BILITOT, PROT, ALBUMIN,  in the last 168 hours  Recent Labs Lab 11/16/13 0644  LIPASE 47   No results found for this basename: AMMONIA,  in the last 168 hours CBC:  Recent Labs Lab 11/11/13 0407 11/12/13 0415 11/13/13 1122 11/14/13 0250 11/15/13 0400  WBC 5.8 5.1 5.1 7.3 6.4  HGB 8.0* 8.4* 8.5* 8.0* 7.7*  HCT 23.3* 25.1* 25.3* 24.1* 22.5*  MCV 86.3 88.4 88.2 88.0 86.5  PLT 237 213 203 188 185   Cardiac Enzymes: No results found for this basename: CKTOTAL, CKMB, CKMBINDEX, TROPONINI,  in the last 168 hours BNP (last 3 results)  Recent Labs  11/19/2013 1512  PROBNP 1572.0*   CBG:  Recent Labs Lab 11/16/13 2022 11/17/13 0020 11/17/13 0426 11/17/13 0744 11/17/13 1127  GLUCAP 80 88 97 99 106*    Recent Results (from the past 240 hour(s))  CULTURE, BLOOD (ROUTINE X 2)     Status: None   Collection Time    11/17/2013  3:12 PM      Result Value Ref Range Status   Specimen Description BLOOD RIGHT ARM   Final   Special Requests BOTTLES DRAWN AEROBIC AND ANAEROBIC 5CC   Final   Culture  Setup Time     Final   Value: 11/12/2013 20:39     Performed at [facility]   Culture     Final    Value: NO GROWTH 5 DAYS     Performed at [facility]   Report Status 11/14/2013 FINAL   Final  CULTURE, BLOOD (ROUTINE X 2)     Status: None   Collection Time    11/30/2013  3:36 PM      Result Value Ref Range Status   Specimen Description BLOOD LEFT HAND   Final   Special Requests BOTTLES DRAWN AEROBIC AND ANAEROBIC [facility] EACH   Final   Culture  Setup Time     Final   Value: 12/04/2013 20:40     Performed at [facility]   Culture     Final   Value: NO GROWTH 5 DAYS     Performed at [facility]   Report Status 11/14/2013 FINAL   Final  MRSA PCR SCREENING     Status: None   Collection Time    11/26/2013  6:54 PM  Result Value Ref Range Status   MRSA by PCR NEGATIVE  NEGATIVE Final   Comment:            The GeneXpert MRSA Assay (FDA     approved for NASAL specimens     only), is one component of a     comprehensive MRSA colonization     surveillance program. It is not     intended to diagnose MRSA     infection nor to guide or     monitor treatment for     MRSA infections.  URINE CULTURE     Status: None   Collection Time    12/06/2013  9:44 PM      Result Value Ref Range Status   Specimen Description URINE, CATHETERIZED   Final   Special Requests NONE   Final   Culture  Setup Time     Final   Value: 11/09/2013 04:04     Performed at [place]     Final   Value: NO GROWTH     Performed at [facility]   Culture     Final   Value: NO GROWTH     Performed at [facility]   Report Status 11/10/2013 FINAL   Final  CLOSTRIDIUM DIFFICILE BY PCR     Status: Abnormal   Collection Time    11/14/13 12:44 PM      Result Value Ref Range Status   C difficile by pcr POSITIVE (*) NEGATIVE Final   Comment: CRITICAL RESULT CALLED TO, READ BACK BY AND VERIFIED WITH:     Bonne Dolores RN AT [id number] 11/14/13 BY WOOLLENK     Studies: Dg Abd 1 View  11/16/2013   CLINICAL DATA:  Abdominal pain and diarrhea.  Recent  colitis.  EXAM: ABDOMEN - 1 VIEW  COMPARISON:  11/09/2013 and CT scan dated 10/26/2013  FINDINGS: There is a minimal amount of air in the nondistended colon. No appreciable dilated small bowel. No visible free air or free fluid. No acute osseous abnormality. Prior lumbar fusion at L4-5.  IMPRESSION: No acute abnormalities.   Electronically Signed   By: Rozetta Nunnery M.D.   On: 11/16/2013 22:21   Dg Chest Port 1 View  11/16/2013   CLINICAL DATA:  Pneumonia .  EXAM: PORTABLE CHEST - 1 VIEW  COMPARISON:  DG CHEST 1V PORT dated 11/10/2013  FINDINGS: Interim removal of endotracheal tube, NG tube, and central line. Stable severe cardiomegaly. Mild pulmonary venous congestion. Diffuse pulmonary interstitial prominence has diminished significantly. These findings are consistent with improving congestive heart failure. No pleural effusion or pneumothorax. No acute osseous abnormality.  IMPRESSION: 1. Interim removal of endotracheal tube, NG tube, and central line. 2. Significant improvement of changes of congestive heart failure.   Electronically Signed   By: Marcello Moores  Register   On: 11/16/2013 07:40    Scheduled Meds: . buPROPion  150 mg Oral Daily  . escitalopram  20 mg Oral Daily  . feeding supplement (ENSURE)  1 Container Oral TID BM  . lamoTRIgine  200 mg Oral Daily  . levothyroxine  88 mcg Oral QAC breakfast  . magnesium sulfate 1 - 4 g bolus IVPB  2 g Intravenous Once  . metoCLOPramide (REGLAN) injection  10 mg Intravenous 4 times per day  . vancomycin  500 mg Oral 4 times per day   And  . metronidazole  500 mg Intravenous 3 times per day  . piperacillin-tazobactam (  ZOSYN)  IV  3.375 g Intravenous Q8H  . potassium chloride  10 mEq Intravenous Q1 Hr x 4  . QUEtiapine  200 mg Oral QHS   Continuous Infusions: . dextrose 1,000 mL (11/16/13 1848)    Active Problems:   Acute respiratory failure   Upper GI bleed   Acute systolic CHF (congestive heart failure)   Pneumonia    Time spent: 35  minutes    [facility] Pager [phone]. If 7PM-7AM, please contact night-coverage at [email or web address], password [facility] 11/17/2013, 1:41 PM  LOS: 9 days

## 2013-11-17 NOTE — Progress Notes (Signed)
Daily Rounding Note  11/17/2013, 2:47 PM  LOS: 9 days   SUBJECTIVE:       See consult note of 11/11/2012 with Dr Henrene Pastor.  [name] is a obese 63 y.o. female with multiple medical problems. She is known to Dr. Olevia Perches for a history of adenomatous colon polyps, diverticulossis and GERD. S/p lumbar fusion and decompression in mid January. Following that patient took antibiotics for a UTI. In mid February patient admitted with diarrhea and thrush. Stool was positive for salmonella and C-diff.  Non-contrast CT scan at that time revealed inflammatory changes involving the cecum and ascending colon. Hospitalization was complicated by acute renal failure and respiratory failure secondary to PNA and heart failure. She was discharged on oral vanco for c-diff and diflucan for thrush.  Patient transferred back to hospital from [facility] 12/02/2013 with acute respiratory failure felt to be combination of PNA and heart failure. She required intubation at which time some coffee ground material was encountered, later NGT spirate with CG. No melenic stools. Her baseline hgb is 11-12, it was 9.6 on 11/03/13. Hgb this admission has fluctuated between 8.o and 9, BUN normal. At time of consult pt had no complaints of abdominal pain prior to admission. No known hematemesis or melena prior to admission. She does have a history of GERD and takes daily Nexium . She required oral ETT tube 3/2 - 3/5.   Dr Henrene Pastor did not elect to do EGD for transient CG. She was continued on PPI.  Cdiff is again + as of 3/7 (+ on 2/16 too) and has been started on vanco po, flagyl as of 3/7  She is still having nausea and bilious emesis, loose stools about 3 x day. No melena.  Pain is across breadth ot mid abdomem.  All these sxs present for many years and only able to eat small meals.   PREVIOUS ENDOSCOPIES:  04/25/2010 EGD with dilatation over guidewire  ENDOSCOPIC IMPRESSION:    1) Normal GE junction  2) Polyps, multiple  3) Otherwise normal examination  s/p passage of 17 mm dilator, no evidence of a stricture  RECOMMENDATIONS:  1) Anti-reflux regimen to be follow  continue nexiem 40 mg qd   Colonoscopy July 2011 for polyp surveillance  FINDINGS: There were multiple polyps identified and removed.  throughout the colon. 15 mm lobulated polyp snared from 20 cm,  rest of the polyps 2-3 mm sessile The polyps were removed using  cold biopsy forceps. Polyp was snared, then cauterized with  monopolar cautery. Retrieval was successful (see image2, image6,  image7, image5, and image8). snare polyp Moderate diverticulosis  was found in the sigmoid colon (see image3 and image1). This was  otherwise a normal examination of the colon (see image9 and  image3). Retroflexed views in the rectum revealed no  abnormalities. The scope was then withdrawn from the patient  and the procedure completed.  COMPLICATIONS: None  ENDOSCOPIC IMPRESSION:  1) Polyps, multiple throughout the colon  2) Moderate diverticulosis in the sigmoid colon  3) Otherwise normal examination  RECOMMENDATIONS:  1) Await biopsy results  2) High fiber diet.  REPEAT EXAM: In 5 year(s) for.  OBJECTIVE:         Vital signs in last 24 hours:    Temp:  [98.8 F (37.1 C)-99.2 F (37.3 C)] 99.1 F (37.3 C) (03/10 1129) Pulse Rate:  [83-95] 86 (03/10 1129) Resp:  [18-22] 18 (03/10 1129) BP: (98-141)/(48-79) 136/75 mmHg (03/10  1129) SpO2:  [95 %-98 %] 97 % (03/10 1129) Weight:  [120.294 kg (265 lb 3.2 oz)] 120.294 kg (265 lb 3.2 oz) (03/09 2026) Last BM Date: 11/15/13 General: obese, non-toxic.  Other than being weak she does not look ill.   Heart: RRR.  Chest: productive, loose cough.  No dyspnea.  Abdomen: soft, NT, hugely obese.  No mass.  + BS.  Soft stool is FOB negative.   Extremities: + pedal swelling, no pitting Neuro/Psych:  Cooperative, pleasant.  Intake/Output from previous day: 03/09  0701 - 03/10 0700 In: -  Out: 450 [Urine:450]  Intake/Output this shift:    Lab Results:  Recent Labs  11/15/13 0400  WBC 6.4  HGB 7.7*  HCT 22.5*  PLT 185   BMET  Recent Labs  11/15/13 0400 11/16/13 0644 11/17/13 0823  NA 147 149* 147  K 2.7* 3.0* 3.2*  CL 106 109 107  CO2 22 24 27   GLUCOSE 82 90 113*  BUN 14 11 11   CREATININE 1.11* 1.04 1.08  CALCIUM 8.3* 8.2* 8.3*   LFT No results found for this basename: PROT, ALBUMIN, AST, ALT, ALKPHOS, BILITOT, BILIDIR, IBILI,  in the last 72 hours PT/INR No results found for this basename: LABPROT, INR,  in the last 72 hours Hepatitis Panel No results found for this basename: HEPBSAG, HCVAB, HEPAIGM, HEPBIGM,  in the last 72 hours  Studies/Results: Dg Abd 1 View  11/16/2013   CLINICAL DATA:  Abdominal pain and diarrhea.  Recent colitis.  EXAM: ABDOMEN - 1 VIEW  COMPARISON:  11/09/2013 and CT scan dated 10/26/2013  FINDINGS: There is a minimal amount of air in the nondistended colon. No appreciable dilated small bowel. No visible free air or free fluid. No acute osseous abnormality. Prior lumbar fusion at L4-5.  IMPRESSION: No acute abnormalities.   Electronically Signed   By: Rozetta Nunnery M.D.   On: 11/16/2013 22:21   Dg Chest Port 1 View  11/16/2013   CLINICAL DATA:  Pneumonia .  EXAM: PORTABLE CHEST - 1 VIEW  COMPARISON:  DG CHEST 1V PORT dated 11/10/2013  FINDINGS: Interim removal of endotracheal tube, NG tube, and central line. Stable severe cardiomegaly. Mild pulmonary venous congestion. Diffuse pulmonary interstitial prominence has diminished significantly. These findings are consistent with improving congestive heart failure. No pleural effusion or pneumothorax. No acute osseous abnormality.  IMPRESSION: 1. Interim removal of endotracheal tube, NG tube, and central line. 2. Significant improvement of changes of congestive heart failure.   Electronically Signed   By: Marcello Moores  Register   On: 11/16/2013 07:40    Scheduled  Meds: . buPROPion  150 mg Oral Daily  . escitalopram  20 mg Oral Daily  . feeding supplement (ENSURE)  1 Container Oral TID BM  . lamoTRIgine  200 mg Oral Daily  . levothyroxine  88 mcg Oral QAC breakfast  . magnesium sulfate 1 - 4 g bolus IVPB  2 g Intravenous Once  . metoCLOPramide (REGLAN) injection  10 mg Intravenous 4 times per day  . vancomycin  500 mg Oral 4 times per day   And  . metronidazole  500 mg Intravenous 3 times per day  . piperacillin-tazobactam (ZOSYN)  IV  3.375 g Intravenous Q8H  . potassium chloride  10 mEq Intravenous Q1 Hr x 4  . QUEtiapine  200 mg Oral QHS   Continuous Infusions: . dextrose 1,000 mL (11/16/13 1848)   PRN Meds:.haloperidol lactate, RESOURCE THICKENUP CLEAR   ASSESMENT:   *  Acute  on chronic n/v Elevated AST/ALTto 100/40s max in Feb at time of sepsis, now normal .  Lipase normal.   *  CHF.  PNA.  Still with cough  *  Recent treatment for Salmonella and C diff + stools.  Has 3 loose stools per day.  C diff positive on 3/7 and on po vanc/IV flagyl since.  Ct scan of 12/16 show cecal and ascending inflamation, attributed to infection.   *  Chronic, normocytic anemia. Of chronic disease.   *  Stage 3 CKD  *  Hx gastric polyps.  Treated for Hpylori in 2005  *  Hx adenomatous colon polyps 2005 and 2011.    PLAN   *  Per Dr Olevia Perches:  Set up for EGD tomorrow afternoon.   *  Will trial Pepcid (try to avoid PPI in setting of C diff.)  *  Since Reglan is not helping and it may be exacerbating diarrhea and incontinence, will stop this.   *  Why is she getting Zosyn?  This is making C diff hard to treat. 3/2 urine, 3/1 blood cultures.  couse of this 2/25 - ? , and again on 3/8.  Since CXR 3/9 shows such improvement, will stop this     Azucena Freed  11/17/2013, 2:47 PM Pager: [phone] Attending MD note:   I have taken a history nd reviewed the chart. I agree with the Advanced Practitioner's impression and recommendations.   [facility] Pager # [phone]

## 2013-11-18 ENCOUNTER — Encounter (HOSPITAL_COMMUNITY): Payer: Self-pay | Admitting: *Deleted

## 2013-11-18 ENCOUNTER — Encounter (HOSPITAL_COMMUNITY): Admission: EM | Disposition: E | Payer: Self-pay | Source: Home / Self Care | Attending: Internal Medicine

## 2013-11-18 DIAGNOSIS — I272 Pulmonary hypertension, unspecified: Secondary | ICD-10-CM | POA: Diagnosis present

## 2013-11-18 DIAGNOSIS — K2100 Gastro-esophageal reflux disease with esophagitis, without bleeding: Secondary | ICD-10-CM | POA: Diagnosis present

## 2013-11-18 DIAGNOSIS — K263 Acute duodenal ulcer without hemorrhage or perforation: Secondary | ICD-10-CM | POA: Diagnosis present

## 2013-11-18 DIAGNOSIS — E785 Hyperlipidemia, unspecified: Secondary | ICD-10-CM

## 2013-11-18 DIAGNOSIS — I2789 Other specified pulmonary heart diseases: Secondary | ICD-10-CM

## 2013-11-18 HISTORY — PX: ESOPHAGOGASTRODUODENOSCOPY: SHX5428

## 2013-11-18 LAB — CBC WITH DIFFERENTIAL/PLATELET
Basophils Absolute: 0 10*3/uL (ref 0.0–0.1)
Basophils Relative: 0 % (ref 0–1)
Eosinophils Absolute: 0 10*3/uL (ref 0.0–0.7)
Eosinophils Relative: 0 % (ref 0–5)
HEMATOCRIT: 26.9 % — AB (ref 36.0–46.0)
HEMOGLOBIN: 9 g/dL — AB (ref 12.0–15.0)
Lymphocytes Relative: 12 % (ref 12–46)
Lymphs Abs: 1.4 10*3/uL (ref 0.7–4.0)
MCH: 29.4 pg (ref 26.0–34.0)
MCHC: 33.5 g/dL (ref 30.0–36.0)
MCV: 87.9 fL (ref 78.0–100.0)
MONO ABS: 0.8 10*3/uL (ref 0.1–1.0)
MONOS PCT: 7 % (ref 3–12)
NEUTROS ABS: 9.5 10*3/uL — AB (ref 1.7–7.7)
Neutrophils Relative %: 81 % — ABNORMAL HIGH (ref 43–77)
Platelets: 378 10*3/uL (ref 150–400)
RBC: 3.06 MIL/uL — ABNORMAL LOW (ref 3.87–5.11)
RDW: 18.9 % — AB (ref 11.5–15.5)
WBC: 11.8 10*3/uL — ABNORMAL HIGH (ref 4.0–10.5)

## 2013-11-18 LAB — BASIC METABOLIC PANEL
BUN: 11 mg/dL (ref 6–23)
CALCIUM: 8.2 mg/dL — AB (ref 8.4–10.5)
CO2: 27 meq/L (ref 19–32)
Chloride: 106 mEq/L (ref 96–112)
Creatinine, Ser: 1.04 mg/dL (ref 0.50–1.10)
GFR calc non Af Amer: 56 mL/min — ABNORMAL LOW (ref >90)
GFR, EST AFRICAN AMERICAN: 65 mL/min — AB (ref >90)
Glucose, Bld: 97 mg/dL (ref 70–99)
Potassium: 2.9 mEq/L — CL (ref 3.7–5.3)
SODIUM: 147 meq/L (ref 137–147)

## 2013-11-18 LAB — GLUCOSE, CAPILLARY
GLUCOSE-CAPILLARY: 91 mg/dL (ref 70–99)
Glucose-Capillary: 93 mg/dL (ref 70–99)
Glucose-Capillary: 97 mg/dL (ref 70–99)
Glucose-Capillary: 105 mg/dL — ABNORMAL HIGH (ref 70–99)

## 2013-11-18 LAB — CBC
HCT: 25.8 % — ABNORMAL LOW (ref 36.0–46.0)
HEMOGLOBIN: 8.7 g/dL — AB (ref 12.0–15.0)
MCH: 29.6 pg (ref 26.0–34.0)
MCHC: 33.7 g/dL (ref 30.0–36.0)
MCV: 87.8 fL (ref 78.0–100.0)
Platelets: 336 10*3/uL (ref 150–400)
RBC: 2.94 MIL/uL — ABNORMAL LOW (ref 3.87–5.11)
RDW: 18.9 % — AB (ref 11.5–15.5)
WBC: 11.4 10*3/uL — ABNORMAL HIGH (ref 4.0–10.5)

## 2013-11-18 LAB — POTASSIUM: Potassium: 2.6 mEq/L — CL (ref 3.7–5.3)

## 2013-11-18 LAB — MAGNESIUM
MAGNESIUM: 1.8 mg/dL (ref 1.5–2.5)
MAGNESIUM: 1.7 mg/dL (ref 1.5–2.5)

## 2013-11-18 SURGERY — EGD (ESOPHAGOGASTRODUODENOSCOPY)
Anesthesia: Moderate Sedation

## 2013-11-18 MED ORDER — POTASSIUM CHLORIDE CRYS ER 20 MEQ PO TBCR
20.0000 meq | EXTENDED_RELEASE_TABLET | Freq: Every day | ORAL | Status: DC
  Administered 2013-11-19: 20 meq via ORAL
  Filled 2013-11-18 (×2): qty 1

## 2013-11-18 MED ORDER — ALBUTEROL SULFATE (2.5 MG/3ML) 0.083% IN NEBU: 2.5000 mg | INHALATION_SOLUTION | Freq: Four times a day (QID) | RESPIRATORY_TRACT | Status: DC | PRN

## 2013-11-18 MED ORDER — GUAIFENESIN ER 600 MG PO TB12
600.0000 mg | ORAL_TABLET | Freq: Two times a day (BID) | ORAL | Status: DC
  Administered 2013-11-19 – 2013-11-20 (×2): 600 mg via ORAL
  Filled 2013-11-18 (×7): qty 1

## 2013-11-18 MED ORDER — POTASSIUM CHLORIDE 10 MEQ/100ML IV SOLN
10.0000 meq | INTRAVENOUS | Status: AC
  Administered 2013-11-18 (×3): 10 meq via INTRAVENOUS
  Filled 2013-11-18 (×3): qty 100

## 2013-11-18 MED ORDER — LEVOTHYROXINE SODIUM 88 MCG PO TABS: 88.0000 ug | ORAL_TABLET | Freq: Every day | ORAL | Status: DC

## 2013-11-18 MED ORDER — ALBUTEROL SULFATE 1.25 MG/3ML IN NEBU: 1.2500 mg | INHALATION_SOLUTION | Freq: Four times a day (QID) | RESPIRATORY_TRACT | Status: DC | PRN

## 2013-11-18 MED ORDER — BUTAMBEN-TETRACAINE-BENZOCAINE 2-2-14 % EX AERO
INHALATION_SPRAY | CUTANEOUS | Status: DC | PRN
  Administered 2013-11-18: 1 via TOPICAL

## 2013-11-18 MED ORDER — FENTANYL CITRATE 0.05 MG/ML IJ SOLN
INTRAMUSCULAR | Status: DC | PRN
  Administered 2013-11-18: 25 ug via INTRAVENOUS

## 2013-11-18 MED ORDER — AZITHROMYCIN 500 MG PO TABS
500.0000 mg | ORAL_TABLET | Freq: Every day | ORAL | Status: DC
  Administered 2013-11-18 – 2013-11-19 (×2): 500 mg via ORAL
  Filled 2013-11-18 (×2): qty 1

## 2013-11-18 MED ORDER — POTASSIUM CHLORIDE 10 MEQ/100ML IV SOLN
10.0000 meq | INTRAVENOUS | Status: AC
  Administered 2013-11-19 (×3): 10 meq via INTRAVENOUS
  Filled 2013-11-18 (×4): qty 100

## 2013-11-18 MED ORDER — SACCHAROMYCES BOULARDII 250 MG PO CAPS
250.0000 mg | ORAL_CAPSULE | Freq: Two times a day (BID) | ORAL | Status: DC
  Administered 2013-11-19 – 2013-12-11 (×43): 250 mg via ORAL
  Filled 2013-11-18 (×48): qty 1

## 2013-11-18 MED ORDER — MIDAZOLAM HCL 5 MG/ML IJ SOLN
INTRAMUSCULAR | Status: AC
  Filled 2013-11-18: qty 1

## 2013-11-18 MED ORDER — FENTANYL CITRATE 0.05 MG/ML IJ SOLN
INTRAMUSCULAR | Status: AC
  Filled 2013-11-18: qty 2

## 2013-11-18 MED ORDER — SUCRALFATE 1 GM/10ML PO SUSP
1.0000 g | Freq: Three times a day (TID) | ORAL | Status: DC
  Administered 2013-11-18 – 2013-12-12 (×84): 1 g via ORAL
  Filled 2013-11-18 (×101): qty 10

## 2013-11-18 MED ORDER — MIDAZOLAM HCL 10 MG/2ML IJ SOLN
INTRAMUSCULAR | Status: DC | PRN
  Administered 2013-11-18: 2 mg via INTRAVENOUS
  Administered 2013-11-18: 1 mg via INTRAVENOUS

## 2013-11-18 MED ORDER — AZITHROMYCIN 500 MG PO TABS: 500.0000 mg | ORAL_TABLET | Freq: Every day | ORAL | Status: DC

## 2013-11-18 MED ORDER — DEXTROSE 5 % IV SOLN
1.0000 g | INTRAVENOUS | Status: DC
  Administered 2013-11-18: 1 g via INTRAVENOUS
  Filled 2013-11-18 (×2): qty 10

## 2013-11-18 MED ORDER — VANCOMYCIN HCL 125 MG PO CAPS: 125.0000 mg | ORAL_CAPSULE | Freq: Four times a day (QID) | ORAL | Status: DC

## 2013-11-18 MED ORDER — PANTOPRAZOLE SODIUM 20 MG PO TBEC
20.0000 mg | DELAYED_RELEASE_TABLET | Freq: Two times a day (BID) | ORAL | Status: DC
  Administered 2013-11-18 – 2013-11-19 (×2): 20 mg via ORAL
  Filled 2013-11-18 (×3): qty 1

## 2013-11-18 MED ORDER — CEFTRIAXONE SODIUM 1 G IJ SOLR
1.0000 g | Freq: Every day | INTRAMUSCULAR | Status: DC
  Filled 2013-11-18: qty 10

## 2013-11-18 NOTE — Op Note (Signed)
[facility] [place], [id number]   ENDOSCOPY PROCEDURE REPORT  PATIENT: Sydney, Johnson  MR#: [id number] BIRTHDATE: 1951-02-02 , 62  yrs. old GENDER: Female ENDOSCOPIST: Lafayette Dragon, MD REFERRED BY:  Dr A.Viyuoh PROCEDURE DATE:  12/01/2013 PROCEDURE:  EGD w/ biopsy ASA CLASS:     Class III INDICATIONS:  Nausea.   Hematemesis.   aspiration pneumonia.  Small volume hematemesis, morbid obesity, C.  difficile colitis. MEDICATIONS: These medications were titrated to patient response per physician's verbal order, Fentanyl 25 mcg IV, and Versed 3 mg IV TOPICAL ANESTHETIC: Cetacaine Spray  DESCRIPTION OF PROCEDURE: After the risks benefits and alternatives of the procedure were thoroughly explained, informed consent was obtained.  The PENTAX GASTOROSCOPE [id number] endoscope was introduced through the mouth and advanced to the second portion of the duodenum. Without limitations.  The instrument was slowly withdrawn as the mucosa was fully examined.      Esophagus: Orting in upper esophagus through the mid and distal esophagus mucosa was all 8 acutely inflamed. Consistent with severe reflux esophagitis. There was purulent material and white exudate covering the mucosa. There was no stricture. Biopsies were obtained M: Gastric mucosa appeared normal in the proximal and midportion of the stomach. The gastric antrum and pyloric outlet was normal. Retroflexion of the endoscope revealed retained bilious material Duodenum: Duodenal bulb was inflamed and there was a  shallow duodenal ulcer 5 mm in diameter it in the post bulbar duodenum surrounded by   marked edema,  friability and exudate. Biopsies were obtained to rule out H. pyloric there was no significant obstruction. There was no stigmata of bleeding[         The scope was then withdrawn from the patient and the procedure completed.  COMPLICATIONS: There were no complications. ENDOSCOPIC  IMPRESSION:  severe grade 4 reflux esophagitis status post biopsies Mild gastric retention Small duodenal ulcer without stigmata of bleeding Moderately severe duodenitis status post biopsies to rule out H. pylori  RECOMMENDATIONS:  Strict antireflux measures- head of the bed elevation at all times Full liquid Add Carafate slurry continue PPI  REPEAT EXAM:  eSigned:  Lafayette Dragon, MD 11/14/2013 4:35 PM   CC:  PATIENT NAME:  [name], [name] MR#: [id number]

## 2013-11-18 NOTE — Progress Notes (Signed)
[facility] PROGRESS NOTE  ENVI EAGLESON VQQ:[id number] DOB: 04/25/1951 DOA: 11/19/2013 PCP: [name], MD  Assessment/Plan:  Acute respiratory failure: Secondary to aspiration pneumonia.  -clinically improved, continue abx   Pulmonary hypertension   Upper GI bleed:  Coffee ground material seen at time of intubation and also in NG tube canister following intubation on 3/3. Gastroenterology consulted. The following day, no further coffee ground material seen. But possibly some minor erosive esophagitis or gastritis. On PPI. Hemoglobin has remained stable, follow and recheck.  -EGD pending 3/11  Aspiration Pneumonia: After initial swallow evaluation on 3/6, diet restricted. Followup on 3/7 showed worsening aspiration so patient downgraded to n.p.o. Status.  -repeat xray 3/9 with significant improvement of infiltrates   4.Dysphagia:  -ST today recommending GI given her increased N/V  -noted that pt was seen by GI this hospital stay for ? Hematemesis and signed off  -GI consulted as discussed below for further recs  5.Depression  -continue Lexapro  6.Hypothyroidism:  -Synthroid changed to by mouth.  7.Hyperlipidemia: Stable. Statin we started  8.Hypertension: Stable.  9.History of C. difficile colitis: Stable. Completed course of antibiotics. Repeat culture 3/7 positive. -continue IV Flagyl and by mouth vancomycin  10.History of psychosis: Likely being off of Seroquel, Lamictal and Lexapro for almost one week may be contributing factor to decreased mentation and worsening swallowing.  - Meds restarted 3/6 evening- continue.  11.Hypernatremia:  - Changed IV fluids to back d5W on 3/9. Sodium improved to 147, follow and recheck  12.Hypokalemia  -replace k again  -Magnesium was replaced.  13.Hypomagnesemia:  Replete mg again and follow  14.Nausea vomiting  -lipase wnl, abd xray neg  -increase reglan>> monitor and if any increase in diarrhea dose will need to be  decreased or d/ced as clinically appropriate  -reconsulted GI today for further recommendations  Code Status: Full code  Family Communication: Sister at the bedside  Disposition Plan: Unclear. Physical therapy recommending skilled nursing.    Consultants:    Procedures: Intubation 3/2-3/5  NG tube 3/2-3/5  Right IJ central line 3/1-3/6    Antibiotics: IV Zosyn 3/1-stopped 3/10 IV Flagyl 3/7>> By mouth vancomycin 3/7>>   HPI/Subjective: 63 year old obese female with history of hypertension, hypothyroidism and psychosis who was discharged on 2/25 after hospitalization for C. difficile colitis and we admitted on 3/1 for acute respiratory failure secondary to pneumonia. She was admitted to the critical care service and intubated. Echocardiogram checked and found to be unremarkable. Patient started on broad-spectrum antibiotics. By 3/5, she was able to be extubated and transferred to the hospitalist service in the step down unit starting 3/7. Trace pitting by speech therapy who initially recommended downgrading her diet to dysphagia with nectar thick liquids, however followup evaluation in 3/7 noted further downgraded to n.p.o. with meds only. Patient has been getting slightly more concentrated with increase in her sodium which was 150 by 3/7 and started on gentle half normal saline. C. difficile cultures repeated and patient still positive. Still having some diarrhea. 3/11 patient states can not keep her vancomycin down. However talking to her RN when patient does not know she is taking vancomycin able to keep medication down. Currently n.p.o. scheduled for EGD today   Objective: Filed Vitals:   11/17/13 1129 11/17/13 1643 11/17/13 2131 11/12/2013 0315  BP: 136/75 133/76 130/80 124/68  Pulse: 86 85 97 96  Temp: 99.1 F (37.3 C) 98.5 F (36.9 C) 99 F (37.2 C) 97.7 F (36.5 C)  TempSrc: Oral Oral Oral Oral  Resp: 18 20 20 20   Height:   5\' 6"  (1.676 m)   Weight:   117.3 kg (258 lb  9.6 oz)   SpO2: 97% 97% 96% 100%    Intake/Output Summary (Last 24 hours) at 11/20/2013 0911 Last data filed at 11/30/2013 0315  Gross per 24 hour  Intake      0 ml  Output    700 ml  Net   -700 ml   Filed Weights   11/16/13 0317 11/16/13 2026 11/17/13 2131  Weight: 120.2 kg (264 lb 15.9 oz) 120.294 kg (265 lb 3.2 oz) 117.3 kg (258 lb 9.6 oz)    Exam:   General:  A./O. x4, NAD  Cardiovascular: Regular rhythm and rate, murmurs rubs gallops  Respiratory: Clear to auscultation bilateral  Abdomen: Obese, nontender, plus bowel sound  Musculoskeletal: Positive one plus pedal edema bilateral to knee   Data Reviewed: Basic Metabolic Panel:  Recent Labs Lab 11/14/13 0250 11/15/13 0400 11/15/13 0750 11/16/13 0644 11/17/13 0823 11/21/2013 0722  NA 150* 147  --  149* 147 147  K 3.1* 2.7*  --  3.0* 3.2* 2.9*  CL 108 106  --  109 107 106  CO2 16* 22  --  24 27 27   GLUCOSE 93 82  --  90 113* 97  BUN 15 14  --  11 11 11   CREATININE 1.27* 1.11*  --  1.04 1.08 1.04  CALCIUM 8.4 8.3*  --  8.2* 8.3* 8.2*  MG  --   --  1.4*  --  1.5 1.8   Liver Function Tests: No results found for this basename: AST, ALT, ALKPHOS, BILITOT, PROT, ALBUMIN,  in the last 168 hours  Recent Labs Lab 11/16/13 0644  LIPASE 47   No results found for this basename: AMMONIA,  in the last 168 hours CBC:  Recent Labs Lab 11/12/13 0415 11/13/13 1122 11/14/13 0250 11/15/13 0400 11/17/2013 0722  WBC 5.1 5.1 7.3 6.4 11.4*  HGB 8.4* 8.5* 8.0* 7.7* 8.7*  HCT 25.1* 25.3* 24.1* 22.5* 25.8*  MCV 88.4 88.2 88.0 86.5 87.8  PLT 213 203 188 185 336   Cardiac Enzymes: No results found for this basename: CKTOTAL, CKMB, CKMBINDEX, TROPONINI,  in the last 168 hours BNP (last 3 results)  Recent Labs  11/28/2013 1512  PROBNP 1572.0*   CBG:  Recent Labs Lab 11/17/13 0744 11/17/13 1127 11/17/13 1641 11/19/2013 0007 12/01/2013 0736  GLUCAP 99 106* 81 91 93    Recent Results (from the past 240 hour(s))   CULTURE, BLOOD (ROUTINE X 2)     Status: None   Collection Time    November 28, 2013  3:12 PM      Result Value Ref Range Status   Specimen Description BLOOD RIGHT ARM   Final   Special Requests BOTTLES DRAWN AEROBIC AND ANAEROBIC 5CC   Final   Culture  Setup Time     Final   Value: 11-28-13 20:39     Performed at [facility]   Culture     Final   Value: NO GROWTH 5 DAYS     Performed at [facility]   Report Status 11/14/2013 FINAL   Final  CULTURE, BLOOD (ROUTINE X 2)     Status: None   Collection Time    11-28-13  3:36 PM      Result Value Ref Range Status   Specimen Description BLOOD LEFT HAND   Final   Special Requests BOTTLES DRAWN AEROBIC AND ANAEROBIC 5CC  EACH   Final   Culture  Setup Time     Final   Value: 12/05/2013 20:40     Performed at [facility]   Culture     Final   Value: NO GROWTH 5 DAYS     Performed at [facility]   Report Status 11/14/2013 FINAL   Final  MRSA PCR SCREENING     Status: None   Collection Time    12/02/2013  6:54 PM      Result Value Ref Range Status   MRSA by PCR NEGATIVE  NEGATIVE Final   Comment:            The GeneXpert MRSA Assay (FDA     approved for NASAL specimens     only), is one component of a     comprehensive MRSA colonization     surveillance program. It is not     intended to diagnose MRSA     infection nor to guide or     monitor treatment for     MRSA infections.  URINE CULTURE     Status: None   Collection Time    11/22/2013  9:44 PM      Result Value Ref Range Status   Specimen Description URINE, CATHETERIZED   Final   Special Requests NONE   Final   Culture  Setup Time     Final   Value: 11/09/2013 04:04     Performed at [place]     Final   Value: NO GROWTH     Performed at [facility]   Culture     Final   Value: NO GROWTH     Performed at [facility]   Report Status 11/10/2013 FINAL   Final  CLOSTRIDIUM DIFFICILE BY PCR     Status:  Abnormal   Collection Time    11/14/13 12:44 PM      Result Value Ref Range Status   C difficile by pcr POSITIVE (*) NEGATIVE Final   Comment: CRITICAL RESULT CALLED TO, READ BACK BY AND VERIFIED WITH:     Bonne Dolores RN AT [id number] 11/14/13 BY WOOLLENK     Studies: Dg Abd 1 View  11/16/2013   CLINICAL DATA:  Abdominal pain and diarrhea.  Recent colitis.  EXAM: ABDOMEN - 1 VIEW  COMPARISON:  11/09/2013 and CT scan dated 10/26/2013  FINDINGS: There is a minimal amount of air in the nondistended colon. No appreciable dilated small bowel. No visible free air or free fluid. No acute osseous abnormality. Prior lumbar fusion at L4-5.  IMPRESSION: No acute abnormalities.   Electronically Signed   By: Rozetta Nunnery M.D.   On: 11/16/2013 22:21    Scheduled Meds: . buPROPion  150 mg Oral Daily  . escitalopram  20 mg Oral Daily  . famotidine  20 mg Oral BID  . feeding supplement (ENSURE)  1 Container Oral TID BM  . lamoTRIgine  200 mg Oral Daily  . levothyroxine  88 mcg Oral QAC breakfast  . vancomycin  500 mg Oral 4 times per day   And  . metronidazole  500 mg Intravenous 3 times per day  . QUEtiapine  200 mg Oral QHS   Continuous Infusions: . sodium chloride    . dextrose 1,000 mL (11/11/2013 1000)    Principal Problem:   Aspiration pneumonia Active Problems:   Severe Clostridium difficile enterocolitis   Acute respiratory failure  Hematemesis   Dysphagia, pharyngoesophageal phase   H/O psychosis    Time spent: 40 minute   Marcella Dunnaway, J  [facility] Pager [phone]. If 7PM-7AM, please contact night-coverage at [email or web address], password [facility] 11/25/2013, 9:11 AM  LOS: 10 days

## 2013-11-18 NOTE — Progress Notes (Signed)
CRITICAL VALUE ALERT  Critical value received:  K = 2.6  Date of notification:  12/07/2013   Time of notification:  2115  Critical value read back:yes  Nurse who received alert:  K.Laurance Flatten  MD notified (1st page):  L. [place], New order to [employer]

## 2013-11-19 ENCOUNTER — Encounter (HOSPITAL_COMMUNITY): Payer: Self-pay | Admitting: Internal Medicine

## 2013-11-19 DIAGNOSIS — K2100 Gastro-esophageal reflux disease with esophagitis, without bleeding: Secondary | ICD-10-CM

## 2013-11-19 DIAGNOSIS — K263 Acute duodenal ulcer without hemorrhage or perforation: Secondary | ICD-10-CM

## 2013-11-19 DIAGNOSIS — E669 Obesity, unspecified: Secondary | ICD-10-CM

## 2013-11-19 LAB — COMPREHENSIVE METABOLIC PANEL
ALBUMIN: 2 g/dL — AB (ref 3.5–5.2)
ALT: 9 U/L (ref 0–35)
AST: 14 U/L (ref 0–37)
Alkaline Phosphatase: 50 U/L (ref 39–117)
BUN: 10 mg/dL (ref 6–23)
CALCIUM: 7.8 mg/dL — AB (ref 8.4–10.5)
CO2: 28 mEq/L (ref 19–32)
Chloride: 103 mEq/L (ref 96–112)
Creatinine, Ser: 1.03 mg/dL (ref 0.50–1.10)
GFR calc Af Amer: 66 mL/min — ABNORMAL LOW (ref >90)
GFR calc non Af Amer: 57 mL/min — ABNORMAL LOW (ref >90)
Glucose, Bld: 101 mg/dL — ABNORMAL HIGH (ref 70–99)
Potassium: 2.6 mEq/L — CL (ref 3.7–5.3)
SODIUM: 144 meq/L (ref 137–147)
TOTAL PROTEIN: 4.9 g/dL — AB (ref 6.0–8.3)
Total Bilirubin: 0.3 mg/dL (ref 0.3–1.2)

## 2013-11-19 LAB — CBC WITH DIFFERENTIAL/PLATELET
Basophils Absolute: 0 10*3/uL (ref 0.0–0.1)
Basophils Relative: 0 % (ref 0–1)
Eosinophils Absolute: 0.1 10*3/uL (ref 0.0–0.7)
Eosinophils Relative: 1 % (ref 0–5)
HEMATOCRIT: 25.5 % — AB (ref 36.0–46.0)
HEMOGLOBIN: 8.7 g/dL — AB (ref 12.0–15.0)
LYMPHS ABS: 1.4 10*3/uL (ref 0.7–4.0)
LYMPHS PCT: 14 % (ref 12–46)
MCH: 30.3 pg (ref 26.0–34.0)
MCHC: 34.1 g/dL (ref 30.0–36.0)
MCV: 88.9 fL (ref 78.0–100.0)
MONO ABS: 0.8 10*3/uL (ref 0.1–1.0)
MONOS PCT: 8 % (ref 3–12)
NEUTROS ABS: 7.2 10*3/uL (ref 1.7–7.7)
Neutrophils Relative %: 76 % (ref 43–77)
Platelets: 402 10*3/uL — ABNORMAL HIGH (ref 150–400)
RBC: 2.87 MIL/uL — ABNORMAL LOW (ref 3.87–5.11)
RDW: 19.1 % — ABNORMAL HIGH (ref 11.5–15.5)
WBC: 9.6 10*3/uL (ref 4.0–10.5)

## 2013-11-19 LAB — MAGNESIUM: Magnesium: 1.5 mg/dL (ref 1.5–2.5)

## 2013-11-19 MED ORDER — METOCLOPRAMIDE HCL 5 MG/ML IJ SOLN
5.0000 mg | Freq: Four times a day (QID) | INTRAMUSCULAR | Status: DC
  Administered 2013-11-19 – 2013-11-20 (×5): 5 mg via INTRAVENOUS
  Administered 2013-11-21: 19:00:00 via INTRAVENOUS
  Administered 2013-11-21 – 2013-11-29 (×30): 5 mg via INTRAVENOUS
  Administered 2013-11-29: 18:00:00 via INTRAVENOUS
  Administered 2013-11-29 – 2013-12-02 (×10): 5 mg via INTRAVENOUS
  Administered 2013-12-02: 1 mg via INTRAVENOUS
  Administered 2013-12-02: 5 mg via INTRAVENOUS
  Filled 2013-11-19 (×13): qty 1
  Filled 2013-11-19 (×2): qty 2
  Filled 2013-11-19 (×8): qty 1
  Filled 2013-11-19: qty 2
  Filled 2013-11-19 (×2): qty 1
  Filled 2013-11-19: qty 2
  Filled 2013-11-19 (×3): qty 1
  Filled 2013-11-19: qty 2
  Filled 2013-11-19 (×2): qty 1
  Filled 2013-11-19: qty 2
  Filled 2013-11-19 (×4): qty 1
  Filled 2013-11-19: qty 2
  Filled 2013-11-19 (×2): qty 1
  Filled 2013-11-19 (×2): qty 2
  Filled 2013-11-19 (×5): qty 1
  Filled 2013-11-19: qty 2
  Filled 2013-11-19 (×2): qty 1
  Filled 2013-11-19: qty 2
  Filled 2013-11-19: qty 1
  Filled 2013-11-19 (×2): qty 2
  Filled 2013-11-19 (×4): qty 1
  Filled 2013-11-19: qty 2
  Filled 2013-11-19: qty 1
  Filled 2013-11-19: qty 2
  Filled 2013-11-19: qty 1
  Filled 2013-11-19: qty 2
  Filled 2013-11-19 (×4): qty 1

## 2013-11-19 MED ORDER — POTASSIUM CHLORIDE 10 MEQ/100ML IV SOLN: 10.0000 meq | INTRAVENOUS | Status: DC

## 2013-11-19 MED ORDER — ONDANSETRON HCL 4 MG/2ML IJ SOLN
4.0000 mg | Freq: Four times a day (QID) | INTRAMUSCULAR | Status: DC
  Administered 2013-11-19 – 2013-12-12 (×91): 4 mg via INTRAVENOUS
  Filled 2013-11-19 (×87): qty 2

## 2013-11-19 MED ORDER — MAGNESIUM SULFATE 50 % IJ SOLN
3.0000 g | Freq: Once | INTRAMUSCULAR | Status: AC
  Administered 2013-11-19: 3 g via INTRAVENOUS
  Filled 2013-11-19: qty 6

## 2013-11-19 MED ORDER — POTASSIUM CHLORIDE 10 MEQ/100ML IV SOLN
10.0000 meq | INTRAVENOUS | Status: AC
  Administered 2013-11-19 (×2): 10 meq via INTRAVENOUS
  Filled 2013-11-19 (×2): qty 100

## 2013-11-19 MED ORDER — LIDOCAINE HCL 2 % EX GEL
CUTANEOUS | Status: AC
  Filled 2013-11-19 (×2): qty 5

## 2013-11-19 MED ORDER — SODIUM CHLORIDE 0.9 % IV SOLN
25.0000 mg | Freq: Two times a day (BID) | INTRAVENOUS | Status: DC
  Administered 2013-11-19 – 2013-11-25 (×13): 25 mg via INTRAVENOUS
  Filled 2013-11-19 (×16): qty 1

## 2013-11-19 MED ORDER — PANTOPRAZOLE SODIUM 40 MG IV SOLR
40.0000 mg | Freq: Two times a day (BID) | INTRAVENOUS | Status: DC
  Administered 2013-11-20 – 2013-12-02 (×26): 40 mg via INTRAVENOUS
  Filled 2013-11-19 (×29): qty 40

## 2013-11-19 MED ORDER — IOHEXOL 300 MG/ML  SOLN
25.0000 mL | INTRAMUSCULAR | Status: AC
  Administered 2013-11-19: 25 mL via ORAL

## 2013-11-19 NOTE — Progress Notes (Signed)
1830 11/19/13 nsg Pt unable to tolerate po contrast; order to place ngt per Dr. Olevia Perches. CN unable to place NGT; per pt Radiology places in the past. Dr. Sherral Hammers was notified by CN. Notified CT that NGT was ordered then give contrast through NGT since unable to tolerate po. Report given to incoming RN.

## 2013-11-19 NOTE — Progress Notes (Signed)
[facility] PROGRESS NOTE  [name] PPJ:[id number] DOB: 20-Oct-1950 DOA: 11/14/2013 PCP: Sydney Dawson, MD  Assessment/Plan:  Acute respiratory failure:  -Secondary to aspiration pneumonia.  -clinically improved  Pulmonary hypertension -BP within AHA guidelines -Currently not on BP medication  Upper GI bleed: -Patient has significant pathology; esophagitis, duodenal ulcer, duodenitis. See EGD report below   Aspiration Pneumonia:  -Patient made n.p.o. secondary to refractory N./V. and increased risk of aspirating. -  Dysphagia:  -Patient has significant pathology; esophagitis, duodenal ulcer, duodenitis. See EGD report below -Patient now n.p.o. 3/12 -3/12 NG tube to be placed  Depression  -continue Lexapro, Wellbutrin, once NG tube placed  Hypothyroidism:  -Synthroid 88 mcg per NG tube    Hyperlipidemia:  -Stable.  -Obtain lipid panel   Hypertension:  -Stable.   C. difficile colitis:  -Continue Flagyl+ PO vancomycin  Hx psychosis: -Likely being off of Seroquel, Lamictal and Lexapro for almost one week may be contributing factor to decreased me ntation and worsening swallowing. Continue via NG tube   Hypernatremia:  - Resolved, however patient unable to keep any fluids/nutrition down secondary to refractory N./V., start D5 1/2 normal saline 100 ml/hr -Check CMP in the a.m.   Hypokalemia  -replace k 5 runs secondary to refractory N./V.   -Recheck in the a.m. [name]   Hypomagnesemia:  Replete mg 3 gm recheck in a.m.   Nausea vomiting  -Place NG tube -Most likely secondary to gastroparesis + patient significant pathology i.e. esophagitis/duodenitis/duodenal ulcer.  -Patient started on scheduled Zofran  -Thorazine 25 mg BID -Reglan 5 mgQID  Pulmonary hypertension -Stable, see HTN      Code Status: Full code  Family Communication: None   Disposition Plan: Resolution of refractory N./V.; Physical therapy recommending skilled nursing.     Consultants: Dr. Delfin Edis (GI)   Procedures: EGD 11/16/2013 -severe grade 4 reflux esophagitis status post biopsies  -Mild gastric retention  -Small duodenal ulcer without stigmata of bleeding  -Moderately severe duodenitis status post biopsies to rule out H. Pylori  Intubation 3/2-3/5   NG tube 3/2-3/5   Right IJ central line 3/1-3/6  Echocardiogram 11/09/2013 - Left ventricle: The cavity size was normal. mild LVH.  -LVEF; 55% to 60%. - Atrial septum: No defect or patent foramen ovale was identified. - Pulmonary arteries: PA peak pressure: 68mm Hg (S).    Antibiotics: IV Zosyn 3/1-stopped 3/10 IV Flagyl 3/7>> PO vancomycin 3/7>>   HPI/Subjective: 63yo BF PMHx  hypertension, hypothyroidism and psychosis who was discharged on 2/25 after hospitalization for C. difficile colitis and we admitted on 3/1 for acute respiratory failure secondary to pneumonia. She was admitted to the critical care service and intubated. Echocardiogram checked and found to be unremarkable. Patient started on broad-spectrum antibiotics. By 3/5, she was able to be extubated and transferred to the hospitalist service in the step down unit starting 3/7. Trace pitting by speech therapy who initially recommended downgrading her diet to dysphagia with nectar thick liquids, however followup evaluation in 3/7 noted further downgraded to n.p.o. with meds only. Patient has been getting slightly more concentrated with increase in her sodium which was 150 by 3/7 and started on gentle half normal saline. C. difficile cultures repeated and patient still positive. Still having some diarrhea. 3/11 patient states can not keep her vancomycin down. However talking to her RN when patient does not know she is taking vancomycin able to keep medication down. Currently n.p.o. scheduled for EGD today. 3/12 patient obtain EGD yesterday which showed significant  pathology see report below.   Objective: Filed Vitals:   11/19/13  0613 11/19/13 0827 11/19/13 1231 11/19/13 1820  BP: 113/49 125/73 118/62 146/79  Pulse: 87 85 76 91  Temp: 98.6 F (37 C) 99 F (37.2 C) 99 F (37.2 C) 98.2 F (36.8 C)  TempSrc: Oral Oral Oral Oral  Resp: 18 17 18 18   Height:      Weight:      SpO2: 96% 97% 97% 97%    Intake/Output Summary (Last 24 hours) at 11/19/13 1916 Last data filed at 11/19/13 1630  Gross per 24 hour  Intake      0 ml  Output   1101 ml  Net  -1101 ml   Filed Weights   11/16/13 2026 11/17/13 2131 11/29/2013 2223  Weight: 120.294 kg (265 lb 3.2 oz) 117.3 kg (258 lb 9.6 oz) 119.069 kg (262 lb 8 oz)    Exam:   General:  A./O. x4, extremely uncomfortable secondary to refractory N./V. (witness patient vomiting large amount of clear fluid)    Cardiovascular: Tachycardic Regular rhythm,, negative murmurs rubs gallops  Respiratory: Clear to auscultation bilateral  Abdomen: Obese, nontender, plus bowel sound  Musculoskeletal: Positive one plus pedal edema bilateral to knee   Data Reviewed: Basic Metabolic Panel:  Recent Labs Lab 11/15/13 0400 11/15/13 0750 11/16/13 0644 11/17/13 0823 11/26/2013 0722 11/10/2013 1000 12/08/2013 2013 11/19/13 0540  NA 147  --  149* 147 147  --   --  144  K 2.7*  --  3.0* 3.2* 2.9*  --  2.6* 2.6*  CL 106  --  109 107 106  --   --  103  CO2 22  --  24 27 27   --   --  28  GLUCOSE 82  --  90 113* 97  --   --  101*  BUN 14  --  11 11 11   --   --  10  CREATININE 1.11*  --  1.04 1.08 1.04  --   --  1.03  CALCIUM 8.3*  --  8.2* 8.3* 8.2*  --   --  7.8*  MG  --  1.4*  --  1.5 1.8 1.7  --  1.5   Liver Function Tests:  Recent Labs Lab 11/19/13 0540  AST 14  ALT 9  ALKPHOS 50  BILITOT 0.3  PROT 4.9*  ALBUMIN 2.0*    Recent Labs Lab 11/16/13 0644  LIPASE 47   No results found for this basename: AMMONIA,  in the last 168 hours CBC:  Recent Labs Lab 11/14/13 0250 11/15/13 0400 11/26/2013 0722 11/26/2013 1000 11/19/13 0540  WBC 7.3 6.4 11.4* 11.8* 9.6   NEUTROABS  --   --   --  9.5* 7.2  HGB 8.0* 7.7* 8.7* 9.0* 8.7*  HCT 24.1* 22.5* 25.8* 26.9* 25.5*  MCV 88.0 86.5 87.8 87.9 88.9  PLT 188 185 336 378 402*   Cardiac Enzymes: No results found for this basename: CKTOTAL, CKMB, CKMBINDEX, TROPONINI,  in the last 168 hours BNP (last 3 results)  Recent Labs  Nov 25, 2013 1512  PROBNP 1572.0*   CBG:  Recent Labs Lab 11/17/13 1641 11/30/2013 0007 11/23/2013 0736 11/26/2013 1224 11/09/2013 1723  GLUCAP 81 91 93 97 105*    Recent Results (from the past 240 hour(s))  CLOSTRIDIUM DIFFICILE BY PCR     Status: Abnormal   Collection Time    11/14/13 12:44 PM      Result Value Ref Range Status  C difficile by pcr POSITIVE (*) NEGATIVE Final   Comment: CRITICAL RESULT CALLED TO, READ BACK BY AND VERIFIED WITH:     Bonne Dolores RN AT [id number] 11/14/13 BY [place]     Studies: No results found.  Scheduled Meds: . buPROPion  150 mg Oral Daily  . chlorproMAZINE (THORAZINE) IV  25 mg Intravenous BID  . escitalopram  20 mg Oral Daily  . feeding supplement (ENSURE)  1 Container Oral TID BM  . guaiFENesin  600 mg Oral BID  . iohexol  25 mL Oral Q1 Hr x 2  . lamoTRIgine  200 mg Oral Daily  . levothyroxine  88 mcg Oral QAC breakfast  . metoCLOPramide (REGLAN) injection  5 mg Intravenous 4 times per day  . vancomycin  500 mg Oral 4 times per day   And  . metronidazole  500 mg Intravenous 3 times per day  . ondansetron (ZOFRAN) IV  4 mg Intravenous 4 times per day  . pantoprazole (PROTONIX) IV  40 mg Intravenous Q12H  . potassium chloride SA  20 mEq Oral Daily  . QUEtiapine  200 mg Oral QHS  . saccharomyces boulardii  250 mg Oral BID  . sucralfate  1 g Oral TID AC & HS   Continuous Infusions: . dextrose 75 mL/hr at 12/07/2013 2028    Principal Problem:   Aspiration pneumonia Active Problems:   Severe Clostridium difficile enterocolitis   Acute respiratory failure   Hematemesis   Dysphagia, pharyngoesophageal phase   H/O psychosis    Pulmonary hypertension   Duodenal ulcer, acute   Reflux esophagitis    Time spent: 40 minute   Pal Shell, J  [facility] Pager [phone]. If 7PM-7AM, please contact night-coverage at [email or web address], password [facility] 11/19/2013, 7:16 PM  LOS: 11 days

## 2013-11-19 NOTE — Progress Notes (Signed)
OT Cancellation Note  Patient Details Name: [name] MRN: [id number] DOB: Sep 15, 1950   Cancelled Treatment:    Reason Eval/Treat Not Completed: Patient declined. Pt continues to be nauseous and politely declines OT today. Will re attempt tomorrow  [name] 11/19/2013, 1:31 PM

## 2013-11-19 NOTE — Progress Notes (Signed)
PT Cancellation Note  Patient Details Name: [name] MRN: [id number] DOB: 03/21/1951   Cancelled Treatment:    Reason Eval/Treat Not Completed: Medical issues which prohibited therapy; vomiting most of day today.  States MD considering NG tube.  Will attempt tomorrow.   Ambra Haverstick,[name] 11/19/2013, 3:12 PM

## 2013-11-19 NOTE — Progress Notes (Signed)
Daily Rounding Note  11/19/2013, 1:01 PM  LOS: 11 days   SUBJECTIVE:       Persistent nausea, vomiting.  No stools today, about 3 yesterday.    OBJECTIVE:         Vital signs in last 24 hours:    Temp:  [98.2 F (36.8 C)-99.4 F (37.4 C)] 99 F (37.2 C) (03/12 1231) Pulse Rate:  [76-96] 76 (03/12 1231) Resp:  [17-32] 18 (03/12 1231) BP: (112-158)/(49-111) 118/62 mmHg (03/12 1231) SpO2:  [93 %-100 %] 97 % (03/12 1231) Weight:  [119.069 kg (262 lb 8 oz)] 119.069 kg (262 lb 8 oz) (03/11 2223) Last BM Date: 11/17/13 General: obese, not ill looking   Heart: RRR Chest: significant upper airway mucous and ronchi.  + dyspnea with speech.  Abdomen: obese, Nt,ND.  BS present  Extremities: obese  Neuro/Psych:  Oriented x 3.  No tremor.  Relaxed   Intake/Output from previous day: 03/11 0701 - 03/12 0700 In: -  Out: 700 [Urine:700]  Intake/Output this shift:    Lab Results:  Recent Labs  11/26/2013 0722 11/14/2013 1000 11/19/13 0540  WBC 11.4* 11.8* 9.6  HGB 8.7* 9.0* 8.7*  HCT 25.8* 26.9* 25.5*  PLT 336 378 402*   BMET  Recent Labs  11/17/13 0823 12/03/2013 0722 11/16/2013 2013 11/19/13 0540  NA 147 147  --  144  K 3.2* 2.9* 2.6* 2.6*  CL 107 106  --  103  CO2 27 27  --  28  GLUCOSE 113* 97  --  101*  BUN 11 11  --  10  CREATININE 1.08 1.04  --  1.03  CALCIUM 8.3* 8.2*  --  7.8*   LFT  Recent Labs  11/19/13 0540  PROT 4.9*  ALBUMIN 2.0*  AST 14  ALT 9  ALKPHOS 50  BILITOT 0.3   Scheduled Meds: . azithromycin  500 mg Oral Daily  . buPROPion  150 mg Oral Daily  . cefTRIAXone (ROCEPHIN)  IV  1 g Intravenous Q24H  . escitalopram  20 mg Oral Daily  . famotidine  20 mg Oral BID  . feeding supplement (ENSURE)  1 Container Oral TID BM  . guaiFENesin  600 mg Oral BID  . lamoTRIgine  200 mg Oral Daily  . levothyroxine  88 mcg Oral QAC breakfast  . vancomycin  500 mg Oral 4 times per day   And  .  metronidazole  500 mg Intravenous 3 times per day  . pantoprazole  20 mg Oral BID  . potassium chloride SA  20 mEq Oral Daily  . QUEtiapine  200 mg Oral QHS  . saccharomyces boulardii  250 mg Oral BID  . sucralfate  1 g Oral TID AC & HS   Continuous Infusions: . dextrose 75 mL/hr at 11/16/2013 2028   PRN Meds:.albuterol, haloperidol lactate, RESOURCE THICKENUP CLEAR   ASSESMENT:  *  Acute on chronic n/v EGD 3/11: Severe reflux esophagitis, gastric retention, small DU, moderately sever duodenitis.   On carafate, PPI po bid, bid pepcid, florastor.. Head CT 2/16 with stable, mild, chronic microvascular changes.  No narcotics in use.  * CHF. PNA. Still with cough .  On Zithromax, Zosyn 3/1 - 3/10.  * Recent treatment for Salmonella and C diff + stools. Has 3 loose stools per day. C diff positive on 3/7 and on po vanc/IV flagyl since (day 6) .  Ct scan of 12/16 show cecal and ascending inflamation,  attributed to infection.  *  Dysphagia, pharyngeal on 3/6 BSS. Placed on puree and nectar thicks but downgraded to NPO 3/7 due to clinical observed aspiration. . * Chronic, normocytic anemia. Of chronic disease.  * Stage 3 CKD  * Hx gastric polyps. Treated for H pylori in 2005  * Hx adenomatous colon polyps 2005 and 2011.    PLAN   *  Follow up biopsies for H Pylori.  *  Keep HOB elevated. She is NPO.  *  Will change to IV Protonix. Will stop Pepcid, doubt it is adding much. *  Could add back Reglan but it was not previously helpful and may increase her loose stools.     Azucena Freed  11/19/2013, 1:01 PM Pager: [phone] Attending MD note:   I have taken a history, examined the patient, and reviewed the chart. I agree with the Advanced Practitioner's impression and recommendations. Intractable reflux- suggest NG tube to LOW suction to prevent continuous reflux and ?? Aspiration. She is not a candidate for Nissen fundoplication. Continue PPI and Carafate slurry around the NG tube.  Melburn Popper [facility] Pager # [phone]

## 2013-11-19 NOTE — Progress Notes (Addendum)
OT Cancellation Note  Patient Details Name: [name] MRN: [id number] DOB: 02/05/1951   Cancelled Treatment:    Reason Eval/Treat Not Completed: Patient declined. Pt not feeling well with nausea, requested OT return later. Will re attempt later today as time allows  [name], OT 11/19/2013, 11:45 AM

## 2013-11-19 NOTE — Progress Notes (Signed)
NUTRITION FOLLOW UP  Intervention:   - Continue Ensure Pudding po TID, each supplement provides 170 kcal and 4 grams of protein - If pt continues to be unable to tolerate po intake, consider initiation of tube feeding.   Nutrition Dx:   Inadequate oral intake now related to recent intubation, now with dysphagia as evidenced by diet just advanced. Ongoing.  Goal:   Pt to meet >/= 90% of their estimated nutrition needs; not met  Monitor:   PO intake, labs, weight trends, I/O's  Assessment:   63 year old obese female with history of HTN, GERD, depression, obesity and lumbar spondylosis status post lumbar decompression surgery in January 2015. Just discharged 11/04/13 after treatment for c.diff colitis and salmonella enterocolitis. Hospitalization complicated by encephalopathy and acute renal failure (peak creatinine 3.03). Admitted 2/25 with respiratory failure thought due to combination PNA and HF.   3/6- Patient was extubated on 3/5. Patient has not been receiving tube feeding. S/P bedside swallow evaluation with SLP this afternoon. Diet has been advanced to dysphagia 1 with nectar thick liquids.  3/12- Pt has been on and off of a full liquid diet since 3/6. During RD visit, RN administered medications with juice, which were then instantly regurgitated violently by pt.  Pt underwent endoscopy  3/11. Findings consistent with severe reflux esophagitis per surgery note.  Height: Ht Readings from Last 1 Encounters:  11/17/13 '5\' 6"'  (1.676 m)    Weight Status:   Wt Readings from Last 1 Encounters:  11/23/2013 262 lb 8 oz (119.069 kg)    Re-estimated needs:  Kcal: 1800-2100 Protein: 100-110 Fluid: 1.8-2.0 L/day  Skin: WNL  Diet Order: Full Liquid   Intake/Output Summary (Last 24 hours) at 11/19/13 1323 Last data filed at 11/19/13 1009  Gross per 24 hour  Intake      0 ml  Output    500 ml  Net   -500 ml    Last BM: none recorded  Labs:   Recent Labs Lab 11/17/13 0823  11/27/2013 0722 11/14/2013 1000 12/05/2013 2013 11/19/13 0540  NA 147 147  --   --  144  K 3.2* 2.9*  --  2.6* 2.6*  CL 107 106  --   --  103  CO2 27 27  --   --  28  BUN 11 11  --   --  10  CREATININE 1.08 1.04  --   --  1.03  CALCIUM 8.3* 8.2*  --   --  7.8*  MG 1.5 1.8 1.7  --  1.5  GLUCOSE 113* 97  --   --  101*    CBG (last 3)   Recent Labs  12/07/2013 0736 11/10/2013 1224 12/03/2013 1723  GLUCAP 93 97 105*    Scheduled Meds: . azithromycin  500 mg Oral Daily  . buPROPion  150 mg Oral Daily  . cefTRIAXone (ROCEPHIN)  IV  1 g Intravenous Q24H  . escitalopram  20 mg Oral Daily  . famotidine  20 mg Oral BID  . feeding supplement (ENSURE)  1 Container Oral TID BM  . guaiFENesin  600 mg Oral BID  . lamoTRIgine  200 mg Oral Daily  . levothyroxine  88 mcg Oral QAC breakfast  . vancomycin  500 mg Oral 4 times per day   And  . metronidazole  500 mg Intravenous 3 times per day  . pantoprazole  20 mg Oral BID  . potassium chloride SA  20 mEq Oral Daily  . QUEtiapine  200  mg Oral QHS  . saccharomyces boulardii  250 mg Oral BID  . sucralfate  1 g Oral TID AC & HS    Continuous Infusions: . dextrose 75 mL/hr at 11/19/2013 2028    Terrace Arabia RD, LDN

## 2013-11-19 NOTE — Progress Notes (Signed)
CRITICAL VALUE ALERT  Critical value received:  k = 2.6  Date of notification:  11/19/13   Time of notification:  0715  Critical value read back:yes  Nurse who received alert:  K. Laurance Flatten  MD notified (1st page):  C. Woods  Time of first page:  [phone]

## 2013-11-20 ENCOUNTER — Inpatient Hospital Stay (HOSPITAL_COMMUNITY): Payer: Medicare Other

## 2013-11-20 DIAGNOSIS — K5730 Diverticulosis of large intestine without perforation or abscess without bleeding: Secondary | ICD-10-CM

## 2013-11-20 LAB — COMPREHENSIVE METABOLIC PANEL
ALK PHOS: 52 U/L (ref 39–117)
ALT: 8 U/L (ref 0–35)
AST: 12 U/L (ref 0–37)
Albumin: 1.9 g/dL — ABNORMAL LOW (ref 3.5–5.2)
BUN: 9 mg/dL (ref 6–23)
CHLORIDE: 101 meq/L (ref 96–112)
CO2: 28 meq/L (ref 19–32)
Calcium: 7.8 mg/dL — ABNORMAL LOW (ref 8.4–10.5)
Creatinine, Ser: 0.95 mg/dL (ref 0.50–1.10)
GFR calc Af Amer: 73 mL/min — ABNORMAL LOW (ref >90)
GFR, EST NON AFRICAN AMERICAN: 63 mL/min — AB (ref >90)
Glucose, Bld: 105 mg/dL — ABNORMAL HIGH (ref 70–99)
Potassium: 2.7 mEq/L — CL (ref 3.7–5.3)
SODIUM: 142 meq/L (ref 137–147)
TOTAL PROTEIN: 4.7 g/dL — AB (ref 6.0–8.3)
Total Bilirubin: 0.2 mg/dL — ABNORMAL LOW (ref 0.3–1.2)

## 2013-11-20 LAB — LIPID PANEL
Cholesterol: 116 mg/dL (ref 0–200)
HDL: 47 mg/dL (ref >39)
LDL CALC: 56 mg/dL (ref 0–99)
TRIGLYCERIDES: 65 mg/dL (ref <150)
Total CHOL/HDL Ratio: 2.5 RATIO
VLDL: 13 mg/dL (ref 0–40)

## 2013-11-20 LAB — CBC WITH DIFFERENTIAL/PLATELET
BASOS PCT: 0 % (ref 0–1)
Basophils Absolute: 0 10*3/uL (ref 0.0–0.1)
EOS PCT: 1 % (ref 0–5)
Eosinophils Absolute: 0.1 10*3/uL (ref 0.0–0.7)
HEMATOCRIT: 24.7 % — AB (ref 36.0–46.0)
Hemoglobin: 8.4 g/dL — ABNORMAL LOW (ref 12.0–15.0)
Lymphocytes Relative: 14 % (ref 12–46)
Lymphs Abs: 1.5 10*3/uL (ref 0.7–4.0)
MCH: 30 pg (ref 26.0–34.0)
MCHC: 34 g/dL (ref 30.0–36.0)
MCV: 88.2 fL (ref 78.0–100.0)
MONO ABS: 1 10*3/uL (ref 0.1–1.0)
Monocytes Relative: 9 % (ref 3–12)
NEUTROS ABS: 7.9 10*3/uL — AB (ref 1.7–7.7)
Neutrophils Relative %: 75 % (ref 43–77)
Platelets: 440 10*3/uL — ABNORMAL HIGH (ref 150–400)
RBC: 2.8 MIL/uL — ABNORMAL LOW (ref 3.87–5.11)
RDW: 19.3 % — ABNORMAL HIGH (ref 11.5–15.5)
WBC: 10.5 10*3/uL (ref 4.0–10.5)

## 2013-11-20 LAB — MAGNESIUM
Magnesium: 1.9 mg/dL (ref 1.5–2.5)
Magnesium: 2.1 mg/dL (ref 1.5–2.5)

## 2013-11-20 LAB — GLUCOSE, CAPILLARY
GLUCOSE-CAPILLARY: 93 mg/dL (ref 70–99)
Glucose-Capillary: 104 mg/dL — ABNORMAL HIGH (ref 70–99)

## 2013-11-20 LAB — POTASSIUM: POTASSIUM: 2.8 meq/L — AB (ref 3.7–5.3)

## 2013-11-20 MED ORDER — QUETIAPINE FUMARATE 100 MG PO TABS
100.0000 mg | ORAL_TABLET | Freq: Two times a day (BID) | ORAL | Status: DC
  Administered 2013-11-20 – 2013-12-12 (×44): 100 mg via ORAL
  Filled 2013-11-20 (×47): qty 1

## 2013-11-20 MED ORDER — IOHEXOL 300 MG/ML  SOLN
25.0000 mL | INTRAMUSCULAR | Status: AC
  Administered 2013-11-20 (×2): 25 mL via ORAL

## 2013-11-20 MED ORDER — SODIUM CHLORIDE 0.9 % IJ SOLN
10.0000 mL | INTRAMUSCULAR | Status: DC | PRN
  Administered 2013-11-21 – 2013-11-27 (×6): 10 mL

## 2013-11-20 MED ORDER — IOHEXOL 300 MG/ML  SOLN
100.0000 mL | Freq: Once | INTRAMUSCULAR | Status: AC | PRN
  Administered 2013-11-20: 100 mL via INTRAVENOUS

## 2013-11-20 MED ORDER — LIDOCAINE HCL 2 % EX GEL
CUTANEOUS | Status: AC
  Filled 2013-11-20: qty 40

## 2013-11-20 MED ORDER — POTASSIUM CHLORIDE 10 MEQ/100ML IV SOLN
10.0000 meq | INTRAVENOUS | Status: AC
  Administered 2013-11-20 (×4): 10 meq via INTRAVENOUS
  Filled 2013-11-20 (×5): qty 100

## 2013-11-20 MED ORDER — MAGNESIUM SULFATE 50 % IJ SOLN
3.0000 g | Freq: Once | INTRAVENOUS | Status: AC
  Administered 2013-11-20: 3 g via INTRAVENOUS
  Filled 2013-11-20: qty 6

## 2013-11-20 MED ORDER — POTASSIUM CHLORIDE 10 MEQ/100ML IV SOLN
10.0000 meq | INTRAVENOUS | Status: AC
  Administered 2013-11-20: 10 meq via INTRAVENOUS
  Filled 2013-11-20 (×5): qty 100

## 2013-11-20 NOTE — Progress Notes (Addendum)
CRITICAL VALUE ALERT  Critical value received: K+ 2.8  Date of notification: 11/20/13  Time of notification: 19:08  Critical value read back:yes  Nurse who received alert: Marlis Edelson, RN  MD notified (1st page): MD Lynch night coverage  Time of first page: 19:37 via Amion  MD notified (2nd page): N/A  Time of second page: N/A  Responding MD: MD Donnal Debar  Time MD responded: 19:38

## 2013-11-20 NOTE — Progress Notes (Signed)
Daily Rounding Note  11/20/2013, 12:35 PM  LOS: 12 days   SUBJECTIVE:       Better with Thorazine and Zofran. Less nausea and vomiting.  The pain on right abdomen is better.   CT scan planned with contrast via NGT placed this AM.   OBJECTIVE:         Vital signs in last 24 hours:    Temp:  [98.2 F (36.8 C)-99.2 F (37.3 C)] 98.8 F (37.1 C) (03/13 1000) Pulse Rate:  [91-95] 95 (03/13 1000) Resp:  [17-18] 18 (03/13 1000) BP: (106-146)/(61-79) 106/61 mmHg (03/13 1000) SpO2:  [96 %-98 %] 96 % (03/13 1000) Weight:  [121.473 kg (267 lb 12.8 oz)-121.519 kg (267 lb 14.4 oz)] 121.519 kg (267 lb 14.4 oz) (03/13 0500) Last BM Date: 11/19/13 General: looks better   Heart: RRR Chest: very congested vocals c/w trouble handling oral secretions which are white an foamy Abdomen: soft, BS present,  No tinkling BS.  No tenderness.   Extremities: feet warm.  No pitting edema Neuro/Psych:  Pleasant, alert.   Intake/Output from previous day: 03/12 0701 - 03/13 0700 In: 0  Out: 1001 [Urine:1000; Stool:1]  Intake/Output this shift: Total I/O In: 20 [P.O.:20] Out: -   Lab Results:  Recent Labs  12/07/2013 1000 11/19/13 0540 11/20/13 0630  WBC 11.8* 9.6 10.5  HGB 9.0* 8.7* 8.4*  HCT 26.9* 25.5* 24.7*  PLT 378 402* 440*   BMET  Recent Labs  11/28/2013 0722 12/08/2013 2013 11/19/13 0540 11/20/13 0630  NA 147  --  144 142  K 2.9* 2.6* 2.6* 2.7*  CL 106  --  103 101  CO2 27  --  28 28  GLUCOSE 97  --  101* 105*  BUN 11  --  10 9  CREATININE 1.04  --  1.03 0.95  CALCIUM 8.2*  --  7.8* 7.8*   LFT  Recent Labs  11/19/13 0540 11/20/13 0630  PROT 4.9* 4.7*  ALBUMIN 2.0* 1.9*  AST 14 12  ALT 9 8  ALKPHOS 50 52  BILITOT 0.3 0.2*   PT/INR No results found for this basename: LABPROT, INR,  in the last 72 hours Hepatitis Panel No results found for this basename: HEPBSAG, HCVAB, HEPAIGM, HEPBIGM,  in the last 72  hours  Studies/Results: Dg Abd 1 View  11/20/2013   CLINICAL DATA:  Nasogastric tube placement.  EXAM: ABDOMEN - 1 VIEW  COMPARISON:  None.  FINDINGS: A nasogastric tube is been placed under fluoroscopic guidance with the tip located in the distal stomach.  IMPRESSION: Nasogastric tube placed with tip in distal stomach.   Electronically Signed   By: Aletta Edouard M.D.   On: 11/20/2013 10:04    ASSESMENT:   * Acute on chronic n/v  EGD 3/11: Severe reflux esophagitis, gastric retention, small DU, moderately sever duodenitis.  On carafate, PPI po bid, bid pepcid, florastor, zofran, thorazine. .. Head CT 2/16 with stable, mild, chronic microvascular changes.  No narcotics in use.  * CHF. PNA. Still with cough . On Zithromax, Zosyn 3/1 - 3/10.  * Recent treatment for Salmonella and C diff + stools. Has 3 loose stools per day. C diff positive on 3/7 and on po vanc/IV flagyl since (day 6) .  Ct scan of 12/16 show cecal and ascending inflamation, attributed to infection.  Repeat CT today. * Dysphagia, pharyngeal on 3/6 BSS. Placed on puree and nectar thicks but downgraded to NPO  3/7 due to clinical observed aspiration. .  * Chronic, normocytic anemia. Of chronic disease.  * Stage 3 CKD  * Hx gastric polyps. Treated for H pylori in 2005  * Hx adenomatous colon polyps 2005 and 2011.     PLAN   *  Still awaiting pathology from EGD.  CT scan to be done today.     Azucena Freed  11/20/2013, 12:35 PM Pager: [phone] Attending MD note:   I have taken a history,and reviewed the chart. I agree with the Advanced Practitioner's impression and recommendations. NG tube in place. CT scam of the abdomen worrisome for rectal mass, and colitis Last colon in 2011 multiple polyps. Consider flex sigm or colonoscopy to assess , will see how she does in next few days.  Mel[facility] Pager # [phone]

## 2013-11-20 NOTE — Progress Notes (Signed)
NUTRITION FOLLOW UP  Intervention:   Discontinue Ensure Pudding po TID, each supplement provides 170 kcal and 4 grams of protein Monitor magnesium, potassium, and phosphorus daily for at least 3 days, MD to replete as needed, as pt is at risk for refeeding syndrome given ongoing po intolerance and lack of intake. If enteral nutrition desired, recommend post-pyloric tube with initiation of trophic feedings of Osmolite 1.5 via tube at 20 ml/hr. RD to provide further advancement recommendations if warranted. RD to continue to follow nutrition care plan.  Nutrition Dx:   Inadequate oral intake now related to GI distress AEB inability to tolerate PO's.  Goal:   Pt to meet >/= 90% of their estimated nutrition needs; not met  Monitor:   Diet advancement, labs, weight trends, I/O's  Assessment:   63-year-old obese female with history of HTN, GERD, depression, obesity and lumbar spondylosis status post lumbar decompression surgery in January 2015. Just discharged 11/04/13 after treatment for c.diff colitis and salmonella enterocolitis. Hospitalization complicated by encephalopathy and acute renal failure (peak creatinine 3.03). Admitted 2/25 with respiratory failure thought due to combination PNA and HF.   Patient was extubated on 3/5. BSE on 3/6 with recommendations for Dysphagia 1 with Nectar Thick liquids. Pt has been on and off of a full liquid diet since 3/6. During RD visit, RN administered medications with juice, which were then instantly regurgitated violently by pt.  Pt underwent endoscopy 3/11. Findings consistent with severe reflux esophagitis. Pt with likely gastroparesis. Pt just underwent NGT placement per RN. Plan for NGT to low suction to prevent continuous reflux and possible aspiration.  Patient is at nutrition risk 2/2 development of skin breakdown and poor oral intake since admission.  Potassium low, currently 2.7, requiring repletion. Magnesium WNL. CBG's WNL: 97, 105, 93 Meds  include: D5 at 75 ml/hr, IV Mag Sulfate, IV reglan, IV zofran, IV protonix, IV KCl, carafate  Height: Ht Readings from Last 1 Encounters:  11/17/13 5' 6" (1.676 m)    Weight Status:   Wt Readings from Last 1 Encounters:  11/20/13 267 lb 14.4 oz (121.519 kg)  Admit wt 250 lb  Re-estimated needs:  Kcal: 1800-2100 Protein: 100-110 Fluid: 1.8-2.0 L/day  Skin:  Stage II to upper medial sacrum Stage II to mid medial sacrum Stage II to lower medial sacrum  Diet Order: NPO   Intake/Output Summary (Last 24 hours) at 11/20/13 0932 Last data filed at 11/20/13 0553  Gross per 24 hour  Intake      0 ml  Output   1001 ml  Net  -1001 ml    Last BM: 3/12  Labs:   Recent Labs Lab 12/07/2013 0722 11/20/2013 1000 12/02/2013 2013 11/19/13 0540 11/20/13 0630  NA 147  --   --  144 142  K 2.9*  --  2.6* 2.6* 2.7*  CL 106  --   --  103 101  CO2 27  --   --  28 28  BUN 11  --   --  10 9  CREATININE 1.04  --   --  1.03 0.95  CALCIUM 8.2*  --   --  7.8* 7.8*  MG 1.8 1.7  --  1.5 1.9  GLUCOSE 97  --   --  101* 105*    CBG (last 3)   Recent Labs  11/08/2013 1224 11/27/2013 1723 11/20/13 0756  GLUCAP 97 105* 93    Scheduled Meds: . buPROPion  150 mg Oral Daily  . chlorproMAZINE (THORAZINE) IV    25 mg Intravenous BID  . escitalopram  20 mg Oral Daily  . feeding supplement (ENSURE)  1 Container Oral TID BM  . guaiFENesin  600 mg Oral BID  . lamoTRIgine  200 mg Oral Daily  . levothyroxine  88 mcg Oral QAC breakfast  . lidocaine   Topical NOW  . magnesium sulfate 1 - 4 g bolus IVPB  3 g Intravenous Once  . metoCLOPramide (REGLAN) injection  5 mg Intravenous 4 times per day  . vancomycin  500 mg Oral 4 times per day   And  . metronidazole  500 mg Intravenous 3 times per day  . ondansetron (ZOFRAN) IV  4 mg Intravenous 4 times per day  . pantoprazole (PROTONIX) IV  40 mg Intravenous Q12H  . potassium chloride  10 mEq Intravenous Q1 Hr x 5  . QUEtiapine  200 mg Oral QHS  .  saccharomyces boulardii  250 mg Oral BID  . sucralfate  1 g Oral TID AC & HS    Continuous Infusions: . dextrose 75 mL/hr at 11/19/13 2148      MS, RD, LDN Inpatient Registered Dietitian Pager: [phone] After-hours pager: [phone]     

## 2013-11-20 NOTE — Progress Notes (Signed)
[facility] PROGRESS NOTE  [name] [id number] DOB: August 05, 1951 DOA: 11/16/2013 PCP: [name], MD  Assessment/Plan:  Acute respiratory failure:  -Secondary to aspiration pneumonia.  -clinically improved  Pulmonary hypertension -BP within AHA guidelines -Currently not on BP medication  Upper GI bleed: -Patient has significant pathology; esophagitis, duodenal ulcer, duodenitis. See EGD report below -All medication is to be administered via NG tube (carafate, PPI po bid, bid pepcid, florastor) -  Aspiration Pneumonia:  -Patient made n.p.o. secondary to refractory N./V. and increased risk of aspirating.  Dysphagia:  -Patient has significant pathology; esophagitis, duodenal ulcer, duodenitis. See EGD report below -Patient now n.p.o. 3/12 -  Depression  -continue Lexapro, Wellbutrin, once NG tube placed  Hypothyroidism:  -Synthroid 88 mcg per NG tube    Hyperlipidemia:  -Stable.  -lipid panel within NCEP guidelines   Hypertension:  -Stable.   C. difficile colitis:  -Continue Flagyl+ PO vancomycin -CT scan abdomen and pelvis completed; significant pathology to include possible neoplasm see results below. Will await GI recommendations  Hx psychosis: -Likely being off of Seroquel, Lamictal and Lexapro for almost one week may be contributing factor to decreased me ntation and worsening swallowing. Continue via NG tube   Hypernatremia:  - Resolved, however patient unable to keep any fluids/nutrition down secondary to refractory N./V., start D5 1/2 normal saline 100 ml/hr -Check CMP in the a.m.   Hypokalemia  -replace k 5 runs secondary to refractory N./V.   -Recheck in the a.m. [name]   Hypomagnesemia:  Replete mg 3 gm recheck in a.m.   Nausea vomiting  -Place NG tube -Most likely secondary to gastroparesis + patient significant pathology i.e. esophagitis/duodenitis/duodenal ulcer.  -Patient started on scheduled Zofran  -Thorazine 25 mg  BID -Reglan 5 mgQID  Pulmonary hypertension -Stable, see HTN      Code Status: Full code  Family Communication: None   Disposition Plan: Resolution of refractory N./V.; Physical therapy recommending skilled nursing.    Consultants: Dr. Delfin Edis (GI)   Procedures: Abdomen pelvis with contrast 11/20/2013 -Interval progression of now diffuse colonic wall thickening, surrounding stranding, and pericolonic fluid tracking to the pelvis. Findings are most suggestive of infectious or inflammatory colitis  such as may be seen with Clostridium difficile infection, ischemia, or less likely inflammatory bowel disease. No free air to suggest perforation.  -A polypoid intraluminal masslike filling defect is noted level of the rectum which could represent malignancy such as colon cancer, although oval inpouching related to wall thickening could  appear similar. After the patient's current symptoms resolve, colonoscopy or sigmoidoscopy is recommended for further evaluation at direct visualization.  -New moderate right pleural effusion with right greater than left lower lobe atelectasis.  -New amorphous hyperdensity within the right lower lobe, which could indicate aspiration.   EGD 11/25/2013 -severe grade 4 reflux esophagitis status post biopsies  -Mild gastric retention  -Small duodenal ulcer without stigmata of bleeding  -Moderately severe duodenitis status post biopsies to rule out H. Pylori   PICC/Midline 3/13>>  NG tube 3/13>>>  Intubation 3/2-3/5   NG tube 3/2-3/5   Right IJ central line 3/1-3/6  Echocardiogram 11/09/2013 - Left ventricle: The cavity size was normal. mild LVH.  -LVEF; 55% to 60%. - Atrial septum: No defect or patent foramen ovale was identified. - Pulmonary arteries: PA peak pressure: 18mm Hg (S).    Antibiotics: IV Zosyn 3/1-stopped 3/10 IV Flagyl 3/7>> PO vancomycin 3/7>>   HPI/Subjective: 63yo BF PMHx  hypertension, hypothyroidism and psychosis who  was  discharged on 2/25 after hospitalization for C. difficile colitis and we admitted on 3/1 for acute respiratory failure secondary to pneumonia. She was admitted to the critical care service and intubated. Echocardiogram checked and found to be unremarkable. Patient started on broad-spectrum antibiotics. By 3/5, she was able to be extubated and transferred to the hospitalist service in the step down unit starting 3/7. Trace pitting by speech therapy who initially recommended downgrading her diet to dysphagia with nectar thick liquids, however followup evaluation in 3/7 noted further downgraded to n.p.o. with meds only. Patient has been getting slightly more concentrated with increase in her sodium which was 150 by 3/7 and started on gentle half normal saline. C. difficile cultures repeated and patient still positive. Still having some diarrhea. 3/11 patient states can not keep her vancomycin down. However talking to her RN when patient does not know she is taking vancomycin able to keep medication down. Currently n.p.o. scheduled for EGD today. 3/12 patient obtain EGD yesterday which showed significant pathology see report below. 3/13 patient feeling improved, negative N./V. since placement of NG tube   Objective: Filed Vitals:   11/20/13 0549 11/20/13 1000 11/20/13 1352 11/20/13 1700  BP: 131/69 106/61 132/71 131/41  Pulse: 93 95 95 99  Temp: 99.1 F (37.3 C) 98.8 F (37.1 C) 98.6 F (37 C)   TempSrc: Oral Oral Oral Oral  Resp: 18 18 18 20   Height:      Weight:      SpO2: 98% 96% 96% 94%    Intake/Output Summary (Last 24 hours) at 11/20/13 1717 Last data filed at 11/20/13 1350  Gross per 24 hour  Intake 7062.5 ml  Output    400 ml  Net 6662.5 ml   Filed Weights   12/06/2013 2223 11/19/13 2105 11/20/13 0500  Weight: 119.069 kg (262 lb 8 oz) 121.473 kg (267 lb 12.8 oz) 121.519 kg (267 lb 14.4 oz)    Exam:   General:  A./O. x4, NAD     Cardiovascular: Regular rhythm and rate,,  negative murmurs rubs gallops  Respiratory: Clear to auscultation bilateral  Abdomen: Obese, nontender, plus bowel sound  Musculoskeletal: Positive one plus pedal edema bilateral to knee   Data Reviewed: Basic Metabolic Panel:  Recent Labs Lab 11/16/13 0644 11/17/13 0823 11/23/2013 0722 11/15/2013 1000 11/13/2013 2013 11/19/13 0540 11/20/13 0630  NA 149* 147 147  --   --  144 142  K 3.0* 3.2* 2.9*  --  2.6* 2.6* 2.7*  CL 109 107 106  --   --  103 101  CO2 24 27 27   --   --  28 28  GLUCOSE 90 113* 97  --   --  101* 105*  BUN 11 11 11   --   --  10 9  CREATININE 1.04 1.08 1.04  --   --  1.03 0.95  CALCIUM 8.2* 8.3* 8.2*  --   --  7.8* 7.8*  MG  --  1.5 1.8 1.7  --  1.5 1.9   Liver Function Tests:  Recent Labs Lab 11/19/13 0540 11/20/13 0630  AST 14 12  ALT 9 8  ALKPHOS 50 52  BILITOT 0.3 0.2*  PROT 4.9* 4.7*  ALBUMIN 2.0* 1.9*    Recent Labs Lab 11/16/13 0644  LIPASE 47   No results found for this basename: AMMONIA,  in the last 168 hours CBC:  Recent Labs Lab 11/15/13 0400 11/13/2013 0722 11/11/2013 1000 11/19/13 0540 11/20/13 0630  WBC 6.4 11.4* 11.8* 9.6  10.5  NEUTROABS  --   --  9.5* 7.2 7.9*  HGB 7.7* 8.7* 9.0* 8.7* 8.4*  HCT 22.5* 25.8* 26.9* 25.5* 24.7*  MCV 86.5 87.8 87.9 88.9 88.2  PLT 185 336 378 402* 440*   Cardiac Enzymes: No results found for this basename: CKTOTAL, CKMB, CKMBINDEX, TROPONINI,  in the last 168 hours BNP (last 3 results)  Recent Labs  11/15/2013 1512  PROBNP 1572.0*   CBG:  Recent Labs Lab 12-18-13 0736 December 18, 2013 1224 12/18/13 1723 11/20/13 0756 11/20/13 1153  GLUCAP 93 97 105* 93 104*    Recent Results (from the past 240 hour(s))  CLOSTRIDIUM DIFFICILE BY PCR     Status: Abnormal   Collection Time    11/14/13 12:44 PM      Result Value Ref Range Status   C difficile by pcr POSITIVE (*) NEGATIVE Final   Comment: CRITICAL RESULT CALLED TO, READ BACK BY AND VERIFIED WITH:     Bonne Dolores RN AT 3785 11/14/13 BY  WOOLLENK     Studies: Dg Abd 1 View  11/20/2013   CLINICAL DATA:  Nasogastric tube placement.  EXAM: ABDOMEN - 1 VIEW  COMPARISON:  None.  FINDINGS: A nasogastric tube is been placed under fluoroscopic guidance with the tip located in the distal stomach.  IMPRESSION: Nasogastric tube placed with tip in distal stomach.   Electronically Signed   By: Aletta Edouard M.D.   On: 11/20/2013 10:04   Ct Abdomen Pelvis W Contrast  11/20/2013   CLINICAL DATA:  Diffuse abdominal pain  EXAM: CT ABDOMEN AND PELVIS WITH CONTRAST  TECHNIQUE: Multidetector CT imaging of the abdomen and pelvis was performed using the standard protocol following bolus administration of intravenous contrast.  CONTRAST:  16mL OMNIPAQUE IOHEXOL 300 MG/ML  SOLN  COMPARISON:  DG ABD 1 VIEW dated 11/20/2013; DG ABD 1 VIEW dated 11/16/2013; DG CHEST 1V PORT dated 11/09/2013; CT ABD/PELV WO CM dated 10/26/2013  FINDINGS: Interval development of moderate sized right pleural effusion noted with right greater than left lower lobe atelectasis identified. This likely accounts for the subjectively nodular appearance of left lower lobe subpleural atelectasis on image 18, new since the recent prior exam. There is new amorphous hyperdensity within the right lower lobe peripherally image 14 of unclear significance, possibly related to aspiration.  Contrast is present within nasogastric tube terminating within the stomach. There is now diffuse confluent colonic wall thickening to a maximal thickness of approximately 1 cm at the level of the cecum image 43. Trace colonic surrounding stranding is evident with trace fluid tracking into the pelvis. The most significant presumed inflammatory changes are located about the cecum, as seen previously. No small bowel wall thickening or focal segmental dilatation is identified. The appendix is thought to be visualized at the base of the cecum image 60, grossly unremarkable in its visualized aspects.  Liver, gallbladder,  adrenal glands, kidneys, spleen, and pancreas are normal. No lymphadenopathy.  There is an apparent intraluminal rectal mass measuring 3.2 x 1.7 cm image 75. No evidence for proximal obstruction is identified. Alternatively, this could represent focal wall thickening. Focal inpouching of the wall of the rectosigmoid colon is incidentally noted image 46 which could represent adhesion or redundant colon without evidence for obstruction. Given the appearance on the previous exam, this is felt to most likely represent an anatomic variant.  No new osseous abnormality. L4-L5 fusion hardware reidentified without evidence for hardware complication.  IMPRESSION: Interval progression of now diffuse colonic wall thickening, surrounding stranding,  and pericolonic fluid tracking to the pelvis. Findings are most suggestive of infectious or inflammatory colitis such as may be seen with Clostridium difficile infection, ischemia, or less likely inflammatory bowel disease. No free air to suggest perforation.  A polypoid intraluminal masslike filling defect is noted at the level of the rectum which could represent malignancy such as colon cancer, although oval inpouching related to wall thickening could appear similar. After the patient's current symptoms resolve, colonoscopy or sigmoidoscopy is recommended for further evaluation at direct visualization.  New moderate right pleural effusion with right greater than left lower lobe atelectasis.  New amorphous hyperdensity within the right lower lobe, which could indicate aspiration.  These results will be called to the ordering clinician or representative by the Radiologist Assistant, and communication documented in the PACS Dashboard.   Electronically Signed   By: Conchita Paris M.D.   On: 11/20/2013 16:09   Dg Addison Bailey G Tube Plc W/fl-no Rad  11/20/2013   CLINICAL DATA: unable to place NG by nursing staff   NASO G TUBE PLACEMENT WITH FLUORO  Fluoroscopy was utilized by the requesting  physician.  No radiographic  interpretation.     Scheduled Meds: . buPROPion  150 mg Oral Daily  . chlorproMAZINE (THORAZINE) IV  25 mg Intravenous BID  . escitalopram  20 mg Oral Daily  . guaiFENesin  600 mg Oral BID  . lamoTRIgine  200 mg Oral Daily  . levothyroxine  88 mcg Oral QAC breakfast  . lidocaine   Topical NOW  . metoCLOPramide (REGLAN) injection  5 mg Intravenous 4 times per day  . vancomycin  500 mg Oral 4 times per day   And  . metronidazole  500 mg Intravenous 3 times per day  . ondansetron (ZOFRAN) IV  4 mg Intravenous 4 times per day  . pantoprazole (PROTONIX) IV  40 mg Intravenous Q12H  . QUEtiapine  200 mg Oral QHS  . saccharomyces boulardii  250 mg Oral BID  . sucralfate  1 g Oral TID AC & HS   Continuous Infusions: . dextrose Stopped (11/20/13 1350)    Principal Problem:   Aspiration pneumonia Active Problems:   Severe Clostridium difficile enterocolitis   Acute respiratory failure   Hematemesis   Dysphagia, pharyngoesophageal phase   H/O psychosis   Pulmonary hypertension   Duodenal ulcer, acute   Reflux esophagitis    Time spent: 40 minute   Danile Trier, [place], J  [facility] Pager [phone]. If 7PM-7AM, please contact night-coverage at [email or web address], password [facility] 11/20/2013, 5:17 PM  LOS: 12 days

## 2013-11-20 NOTE — Progress Notes (Signed)
Pt returned from CT. NG tube now connected to low wall intermittent suction per Dr. Sherral Hammers.

## 2013-11-20 NOTE — Progress Notes (Signed)
CRITICAL VALUE ALERT  Critical value received: K+ 2.7  Date of notification:  11/20/13  Time of notification:  7:40  Critical value read back:yes  Nurse who received alert:  Marlis Edelson, RN  MD notified (1st page):  Dr. Sherral Hammers already aware, orders for IV potassium just ordered  Time of first page:  N/A  MD notified (2nd page): N/A  Time of second page: N/A  Responding MD: Dr. Sherral Hammers  Time MD responded:  Orders received at 7:44

## 2013-11-20 NOTE — Progress Notes (Signed)
Per Dr. Sherral Hammers, pt should now receive her PO medications through the NG tube. Suction should be clamped appropriately before and after medications are given, then pt should be returned to low wall intermittent suction afterwards. Night RN will be notified.

## 2013-11-20 NOTE — Progress Notes (Signed)
PT Cancellation Note  Patient Details Name: [name] MRN: [id number] DOB: 12/13/1950   Cancelled Treatment:    Reason Eval/Treat Not Completed: Medical issues which prohibited therapy.  Patient having nausea/vomiting.  NG tube placed.  To have contrast for CT.  Will hold PT today.  Will return tomorrow to attempt PT session.   Despina Pole 11/20/2013, 11:48 AM Carita Pian. Sanjuana Kava, [place] Pager [phone]

## 2013-11-20 NOTE — Progress Notes (Signed)
Peripherally Inserted Central Catheter/Midline Placement  The IV Nurse has discussed with the patient and/or persons authorized to consent for the patient, the purpose of this procedure and the potential benefits and risks involved with this procedure.  The benefits include less needle sticks, lab draws from the catheter and patient may be discharged home with the catheter.  Risks include, but not limited to, infection, bleeding, blood clot (thrombus formation), and puncture of an artery; nerve damage and irregular heat beat.  Alternatives to this procedure were also discussed.    PICC/Midline Placement Documentation        [name], Nicolette Bang 11/20/2013, 5:38 PM

## 2013-11-21 LAB — CBC WITH DIFFERENTIAL/PLATELET
Basophils Absolute: 0 10*3/uL (ref 0.0–0.1)
Basophils Relative: 0 % (ref 0–1)
Eosinophils Absolute: 0.1 10*3/uL (ref 0.0–0.7)
Eosinophils Relative: 1 % (ref 0–5)
HCT: 23.6 % — ABNORMAL LOW (ref 36.0–46.0)
Hemoglobin: 8.1 g/dL — ABNORMAL LOW (ref 12.0–15.0)
LYMPHS PCT: 15 % (ref 12–46)
Lymphs Abs: 1.3 10*3/uL (ref 0.7–4.0)
MCH: 30.2 pg (ref 26.0–34.0)
MCHC: 34.3 g/dL (ref 30.0–36.0)
MCV: 88.1 fL (ref 78.0–100.0)
MONOS PCT: 11 % (ref 3–12)
Monocytes Absolute: 1 10*3/uL (ref 0.1–1.0)
NEUTROS PCT: 73 % (ref 43–77)
Neutro Abs: 6.8 10*3/uL (ref 1.7–7.7)
PLATELETS: 397 10*3/uL (ref 150–400)
RBC: 2.68 MIL/uL — ABNORMAL LOW (ref 3.87–5.11)
RDW: 19.3 % — ABNORMAL HIGH (ref 11.5–15.5)
WBC: 9.2 10*3/uL (ref 4.0–10.5)

## 2013-11-21 LAB — COMPREHENSIVE METABOLIC PANEL
ALBUMIN: 1.8 g/dL — AB (ref 3.5–5.2)
ALT: 7 U/L (ref 0–35)
AST: 11 U/L (ref 0–37)
Alkaline Phosphatase: 51 U/L (ref 39–117)
BILIRUBIN TOTAL: 0.3 mg/dL (ref 0.3–1.2)
BUN: 7 mg/dL (ref 6–23)
CALCIUM: 7.7 mg/dL — AB (ref 8.4–10.5)
CHLORIDE: 100 meq/L (ref 96–112)
CO2: 29 mEq/L (ref 19–32)
Creatinine, Ser: 0.95 mg/dL (ref 0.50–1.10)
GFR calc Af Amer: 73 mL/min — ABNORMAL LOW (ref >90)
GFR, EST NON AFRICAN AMERICAN: 63 mL/min — AB (ref >90)
Glucose, Bld: 86 mg/dL (ref 70–99)
Potassium: 3 mEq/L — ABNORMAL LOW (ref 3.7–5.3)
Sodium: 139 mEq/L (ref 137–147)
Total Protein: 4.4 g/dL — ABNORMAL LOW (ref 6.0–8.3)

## 2013-11-21 LAB — POTASSIUM: POTASSIUM: 3 meq/L — AB (ref 3.7–5.3)

## 2013-11-21 LAB — GLUCOSE, CAPILLARY: Glucose-Capillary: 95 mg/dL (ref 70–99)

## 2013-11-21 LAB — MAGNESIUM: MAGNESIUM: 1.9 mg/dL (ref 1.5–2.5)

## 2013-11-21 MED ORDER — METOPROLOL TARTRATE 12.5 MG HALF TABLET
12.5000 mg | ORAL_TABLET | Freq: Two times a day (BID) | ORAL | Status: DC
  Administered 2013-11-21 – 2013-12-11 (×40): 12.5 mg via ORAL
  Filled 2013-11-21 (×43): qty 1

## 2013-11-21 MED ORDER — DEXTROSE 5 % IV SOLN
3.0000 g | Freq: Once | INTRAVENOUS | Status: AC
  Administered 2013-11-21: 3 g via INTRAVENOUS
  Filled 2013-11-21: qty 6

## 2013-11-21 MED ORDER — DEXTROSE-NACL 5-0.45 % IV SOLN
INTRAVENOUS | Status: DC
  Administered 2013-11-21 – 2013-11-25 (×7): via INTRAVENOUS

## 2013-11-21 MED ORDER — PEG-KCL-NACL-NASULF-NA ASC-C 100 G PO SOLR
1.0000 | Freq: Once | ORAL | Status: AC
  Administered 2013-11-21: 200 g
  Filled 2013-11-21: qty 1

## 2013-11-21 MED ORDER — POTASSIUM CHLORIDE 10 MEQ/100ML IV SOLN
10.0000 meq | INTRAVENOUS | Status: AC
  Administered 2013-11-21 (×6): 10 meq via INTRAVENOUS
  Filled 2013-11-21 (×6): qty 100

## 2013-11-21 MED ORDER — GUAIFENESIN 100 MG/5ML PO SOLN
15.0000 mL | Freq: Four times a day (QID) | ORAL | Status: DC
  Administered 2013-11-21 – 2013-12-11 (×81): 300 mg
  Filled 2013-11-21 (×86): qty 15

## 2013-11-21 NOTE — Progress Notes (Deleted)
ERROR

## 2013-11-21 NOTE — Progress Notes (Signed)
Subjective  Diarrhea following CT scan   Objective   Appears more comfortable. Nausea but no vomiting, CT scan shows diffuse colitis as well as a questionable rectal mass. She has a hx of colon polyps. in 2011. Will plan on prep for colonoscopy for tomorrow to r/o rectal mass. Pt agrees with the plan Vital signs in last 24 hours: Temp:  [98.6 F (37 C)-98.8 F (37.1 C)] 98.6 F (37 C) (03/14 0459) Pulse Rate:  [94-99] 94 (03/14 0459) Resp:  [18-20] 20 (03/14 0459) BP: (106-132)/(41-71) 129/65 mmHg (03/14 0459) SpO2:  [94 %-96 %] 94 % (03/14 0459) Weight:  [274 lb (124.286 kg)] 274 lb (124.286 kg) (03/14 0500) Last BM Date: 11/20/13 General:    AA female in NAD, NG in place Heart:  Regular rate and rhythm; no murmurs Lungs: Respirations even and unlabored, lungs CTA bilaterally Abdomen:  Soft, nontender and nondistended. Normal bowel sounds. Extremities:  Without edema. Neurologic:  Alert and oriented,  grossly normal neurologically. Psych:  Cooperative. Normal mood and affect.  Intake/Output from previous day: 03/13 0701 - 03/14 0700 In: 732.5 [I.V.:607.5; IV Piggyback:125] Out: 1150 [Urine:1150] Intake/Output this shift:    Lab Results:  Recent Labs  11/19/13 0540 11/20/13 0630 11/21/13 0546  WBC 9.6 10.5 9.2  HGB 8.7* 8.4* 8.1*  HCT 25.5* 24.7* 23.6*  PLT 402* 440* 397   BMET  Recent Labs  11/19/13 0540 11/20/13 0630 11/20/13 1755 11/20/13 2330 11/21/13 0546  NA 144 142  --   --  139  K 2.6* 2.7* 2.8* 3.0* 3.0*  CL 103 101  --   --  100  CO2 28 28  --   --  29  GLUCOSE 101* 105*  --   --  86  BUN 10 9  --   --  7  CREATININE 1.03 0.95  --   --  0.95  CALCIUM 7.8* 7.8*  --   --  7.7*   LFT  Recent Labs  11/21/13 0546  PROT 4.4*  ALBUMIN 1.8*  AST 11  ALT 7  ALKPHOS 51  BILITOT 0.3   PT/INR No results found for this basename: LABPROT, INR,  in the last 72 hours  Studies/Results: Dg Abd 1 View  11/20/2013   CLINICAL DATA:  Nasogastric  tube placement.  EXAM: ABDOMEN - 1 VIEW  COMPARISON:  None.  FINDINGS: A nasogastric tube is been placed under fluoroscopic guidance with the tip located in the distal stomach.  IMPRESSION: Nasogastric tube placed with tip in distal stomach.   Electronically Signed   By: Aletta Edouard M.D.   On: 11/20/2013 10:04   Ct Abdomen Pelvis W Contrast  11/20/2013   CLINICAL DATA:  Diffuse abdominal pain  EXAM: CT ABDOMEN AND PELVIS WITH CONTRAST  TECHNIQUE: Multidetector CT imaging of the abdomen and pelvis was performed using the standard protocol following bolus administration of intravenous contrast.  CONTRAST:  192mL OMNIPAQUE IOHEXOL 300 MG/ML  SOLN  COMPARISON:  DG ABD 1 VIEW dated 11/20/2013; DG ABD 1 VIEW dated 11/16/2013; DG CHEST 1V PORT dated 11/09/2013; CT ABD/PELV WO CM dated 10/26/2013  FINDINGS: Interval development of moderate sized right pleural effusion noted with right greater than left lower lobe atelectasis identified. This likely accounts for the subjectively nodular appearance of left lower lobe subpleural atelectasis on image 18, new since the recent prior exam. There is new amorphous hyperdensity within the right lower lobe peripherally image 14 of unclear significance, possibly related to aspiration.  Contrast  is present within nasogastric tube terminating within the stomach. There is now diffuse confluent colonic wall thickening to a maximal thickness of approximately 1 cm at the level of the cecum image 43. Trace colonic surrounding stranding is evident with trace fluid tracking into the pelvis. The most significant presumed inflammatory changes are located about the cecum, as seen previously. No small bowel wall thickening or focal segmental dilatation is identified. The appendix is thought to be visualized at the base of the cecum image 60, grossly unremarkable in its visualized aspects.  Liver, gallbladder, adrenal glands, kidneys, spleen, and pancreas are normal. No lymphadenopathy.  There is  an apparent intraluminal rectal mass measuring 3.2 x 1.7 cm image 75. No evidence for proximal obstruction is identified. Alternatively, this could represent focal wall thickening. Focal inpouching of the wall of the rectosigmoid colon is incidentally noted image 46 which could represent adhesion or redundant colon without evidence for obstruction. Given the appearance on the previous exam, this is felt to most likely represent an anatomic variant.  No new osseous abnormality. L4-L5 fusion hardware reidentified without evidence for hardware complication.  IMPRESSION: Interval progression of now diffuse colonic wall thickening, surrounding stranding, and pericolonic fluid tracking to the pelvis. Findings are most suggestive of infectious or inflammatory colitis such as may be seen with Clostridium difficile infection, ischemia, or less likely inflammatory bowel disease. No free air to suggest perforation.  A polypoid intraluminal masslike filling defect is noted at the level of the rectum which could represent malignancy such as colon cancer, although oval inpouching related to wall thickening could appear similar. After the patient's current symptoms resolve, colonoscopy or sigmoidoscopy is recommended for further evaluation at direct visualization.  New moderate right pleural effusion with right greater than left lower lobe atelectasis.  New amorphous hyperdensity within the right lower lobe, which could indicate aspiration.  These results will be called to the ordering clinician or representative by the Radiologist Assistant, and communication documented in the PACS Dashboard.   Electronically Signed   By: Conchita Paris M.D.   On: 11/20/2013 16:09   Dg Chest Port 1 View  11/20/2013   CLINICAL DATA:  PICC placement.  EXAM: PORTABLE CHEST - 1 VIEW  COMPARISON:  11/16/2013  FINDINGS: Left PICC has been inserted and the tip is in good position in the superior vena cava approximately 17 mm below the carina.  Heart  size and pulmonary vascularity are normal. There is an area of consolidation at the right lung base medially. Left lung is clear.  IMPRESSION: 1. PICC in good position. 2. Infiltrate at the right lung base posterior medially.   Electronically Signed   By: Rozetta Nunnery M.D.   On: 11/20/2013 18:27   Dg Addison Bailey G Tube Plc W/fl-no Rad  11/20/2013   CLINICAL DATA: unable to place NG by nursing staff   NASO G TUBE PLACEMENT WITH FLUORO  Fluoroscopy was utilized by the requesting physician.  No radiographic  interpretation.        Assessment / Plan:   New finding of ? Rectal mass on CT scan, may be an anatomic variation of the sigmoid colon but with hx of colon polyps, will proceed with colonoscopy.  Principal Problem:   Aspiration pneumonia Active Problems:   Severe Clostridium difficile enterocolitis   Acute respiratory failure   Hematemesis   Dysphagia, pharyngoesophageal phase   H/O psychosis   Pulmonary hypertension   Duodenal ulcer, acute   Reflux esophagitis     LOS: 13  days   Delfin Edis  11/21/2013, 7:17 AM

## 2013-11-21 NOTE — Progress Notes (Signed)
PT Cancellation Note  Patient Details Name: [name] MRN: [id number] DOB: [birth year]   Cancelled Treatment:    Reason Eval/Treat Not Completed: Medical issues which prohibited therapy.  Attempted to see patient this pm.  Patient being prepped for colonoscopy.  Will return at later date for PT session.   Despina Pole 11/21/2013, 6:08 PM Carita Pian. Sanjuana Kava, [place] Pager [phone]

## 2013-11-21 NOTE — Progress Notes (Signed)
[facility] PROGRESS NOTE  XOLANI DEGRACIA PIR:[id number] DOB: 01/02/1951 DOA: 12/04/2013 PCP: [name], MD  Assessment/Plan:  Acute respiratory failure:  -Secondary to aspiration pneumonia.  -clinically improved -Continue NG tube  Pulmonary hypertension -BP not within AHA guidelines Start metoprolol 12.5 mg BID  -Currently not on BP medication  Upper GI bleed: -Patient has significant pathology; esophagitis, duodenal ulcer, duodenitis. See EGD report below -All medication to be administered via NG tube (carafate, PPI po bid, bid pepcid, florastor) -  Aspiration Pneumonia:  -Patient made n.p.o. secondary to refractory N./V. and increased risk of aspirating.  Dysphagia:  -Patient has significant pathology; esophagitis, duodenal ulcer, duodenitis. See EGD report below -Patient now n.p.o. 3/12 -  Depression  -continue Lexapro, Wellbutrin, once NG tube placed  Hypothyroidism:  -Synthroid 88 mcg per NG tube    Hyperlipidemia:  -Stable.  -lipid panel within NCEP guidelines   Hypertension:  -BP not within AHA guidelines Start metoprolol 12.5 mg BID    C. difficile colitis:  -Continue Flagyl+ PO vancomycin. Will Consider 3/13 as first full day of treatment (3/13 NG tube placed and patient able to keep all medication down) -CT scan abdomen and pelvis completed; significant pathology to include possible neoplasm see results below.  -3/14 spoke with Dr. Delfin Edis (GI) and she will take patient for colonoscopy on 3/15  Hx psychosis: -Likely being off of Seroquel, Lamictal and Lexapro for almost one week may be contributing factor to decreased me ntation and worsening swallowing. Continue via NG tube   Hypernatremia:  - Resolved -however patient still unable to keep any fluids/nutrition down secondary to refractory N./V., continue D5 1/2 normal saline 125 ml/hr -Check CMP in the a.m.   Hypokalemia  -3/14 replace k 6 runs secondary to refractory N./V., and  patient having bowel prep for colonoscopy this evening   -Recheck in the a.m. [name]   Hypomagnesemia:  Replete mg 3 gm recheck in a.m.   Nausea vomiting  -NG tube placed 3/13 -Most likely secondary to gastroparesis + patient significant pathology i.e. esophagitis/duodenitis/duodenal ulcer.  -Continue scheduled Zofran  -Thorazine 25 mg BID -Reglan 5 mg QID       Code Status: Full code  Family Communication: Spoke with sister over the phone and answered all questions concerning plan of care    Disposition Plan: Resolution of refractory N./V.; Physical therapy recommending skilled nursing.    Consultants: Dr. Delfin Edis (GI)   Procedures: Abdomen pelvis with contrast 11/20/2013 -Interval progression of now diffuse colonic wall thickening, surrounding stranding, and pericolonic fluid tracking to the pelvis. Findings are most suggestive of infectious or inflammatory colitis  such as may be seen with Clostridium difficile infection, ischemia, or less likely inflammatory bowel disease. No free air to suggest perforation.  -A polypoid intraluminal masslike filling defect is noted level of the rectum which could represent malignancy such as colon cancer, although oval inpouching related to wall thickening could  appear similar. After the patient's current symptoms resolve, colonoscopy or sigmoidoscopy is recommended for further evaluation at direct visualization.  -New moderate right pleural effusion with right greater than left lower lobe atelectasis.  -New amorphous hyperdensity within the right lower lobe, which could indicate aspiration.   EGD 11/17/2013 -severe grade 4 reflux esophagitis status post biopsies  -Mild gastric retention  -Small duodenal ulcer without stigmata of bleeding  -Moderately severe duodenitis status post biopsies to rule out H. Pylori   PICC/Midline 3/13>>  NG tube 3/13>>>  Intubation 3/2-3/5   NG tube 3/2-3/5  Right IJ central line  3/1-3/6  Echocardiogram 11/09/2013 - Left ventricle: The cavity size was normal. mild LVH.  -LVEF; 55% to 60%. - Atrial septum: No defect or patent foramen ovale was identified. - Pulmonary arteries: PA peak pressure: 51m Hg (S).    Antibiotics: IV Zosyn 3/1-stopped 3/10 IV Flagyl 3/7>> PO vancomycin 3/7>>   HPI/Subjective: 63yo BF PMHx  hypertension, hypothyroidism and psychosis who was discharged on 2/25 after hospitalization for C. difficile colitis and we admitted on 3/1 for acute respiratory failure secondary to pneumonia. She was admitted to the critical care service and intubated. Echocardiogram checked and found to be unremarkable. Patient started on broad-spectrum antibiotics. By 3/5, she was able to be extubated and transferred to the hospitalist service in the step down unit starting 3/7. Trace pitting by speech therapy who initially recommended downgrading her diet to dysphagia with nectar thick liquids, however followup evaluation in 3/7 noted further downgraded to n.p.o. with meds only. Patient has been getting slightly more concentrated with increase in her sodium which was 150 by 3/7 and started on gentle half normal saline. C. difficile cultures repeated and patient still positive. Still having some diarrhea. 3/11 patient states can not keep her vancomycin down. However talking to her RN when patient does not know she is taking vancomycin able to keep medication down. Currently n.p.o. scheduled for EGD today. 3/12 patient obtain EGD yesterday which showed significant pathology see report below. 3/13 patient feeling improved, negative N./V. since placement of NG tube 3/14 patient with only mild episodes of nausea, states she understands the plan outlined by Dr. BOlevia Perches(GI) concerning the need for colonoscopy   Objective: Filed Vitals:   11/21/13 0500 11/21/13 0845 11/21/13 1050 11/21/13 1254  BP:  141/75 132/66 135/69  Pulse:  93 96 92  Temp:  98.4 F (36.9 C)  98.4 F (36.9  C)  TempSrc:  Oral  Oral  Resp:  _0 Height:      Weight: 124.286 kg (274 lb)     SpO2:  96%  95%    Intake/Output Summary (Last 24 hours) at 11/21/13 1419 Last data filed at 11/21/13 1300  Gross per 24 hour  Intake 118.75 ml  Output   3600 ml  Net -3481.25 ml   Filed Weights   11/19/13 2105 11/20/13 0500 11/21/13 0500  Weight: 121.473 kg (267 lb 12.8 oz) 121.519 kg (267 lb 14.4 oz) 124.286 kg (274 lb)    Exam:   General:  A./O. x4, NAD     Cardiovascular: Regular rhythm and rate,, negative murmurs rubs gallops  Respiratory: Clear to auscultation bilateral  Abdomen: Obese, nontender, plus bowel sound  Musculoskeletal: Positive one plus pedal edema bilateral to knee   Data Reviewed: Basic Metabolic Panel:  Recent Labs Lab 11/17/13 0823 12/08/2013 0722 12/02/2013 1000  11/19/13 0540 11/20/13 0630 11/20/13 1755 11/20/13 2330 11/21/13 0546  NA 147 147  --   --  144 142  --   --  139  K 3.2* 2.9*  --   < > 2.6* 2.7* 2.8* 3.0* 3.0*  CL 107 106  --   --  103 101  --   --  100  CO2 27 27  --   --  28 28  --   --  29  GLUCOSE 113* 97  --   --  101* 105*  --   --  86  BUN 11 11  --   --  10 9  --   --  7  CREATININE 1.08 1.04  --   --  1.03 0.95  --   --  0.95  CALCIUM 8.3* 8.2*  --   --  7.8* 7.8*  --   --  7.7*  MG 1.5 1.8 1.7  --  1.5 1.9 2.1  --  1.9  < > = values in this interval not displayed. Liver Function Tests:  Recent Labs Lab 11/19/13 0540 11/20/13 0630 11/21/13 0546  AST _0 ALT _1 ALKPHOS 50 52 51  BILITOT 0.3 0.2* 0.3  PROT 4.9* 4.7* 4.4*  ALBUMIN 2.0* 1.9* 1.8*    Recent Labs Lab 11/16/13 0644  LIPASE 47   No results found for this basename: AMMONIA,  in the last 168 hours CBC:  Recent Labs Lab 11/11/2013 0722 11/10/2013 1000 11/19/13 0540 11/20/13 0630 11/21/13 0546  WBC 11.4* 11.8* 9.6 10.5 9.2  NEUTROABS  --  9.5* 7.2 7.9* 6.8  HGB 8.7* 9.0* 8.7* 8.4* 8.1*  HCT 25.8* 26.9* 25.5* 24.7* 23.6*  MCV 87.8 87.9 88.9  88.2 88.1  PLT 336 378 402* 440* 397   Cardiac Enzymes: No results found for this basename: CKTOTAL, CKMB, CKMBINDEX, TROPONINI,  in the last 168 hours BNP (last 3 results)  Recent Labs  11/19/2013 1512  PROBNP 1572.0*   CBG:  Recent Labs Lab 11/14/2013 0736 11/24/2013 1224 11/20/2013 1723 11/20/13 0756 11/20/13 1153  GLUCAP 93 97 105* 93 104*    Recent Results (from the past 240 hour(s))  CLOSTRIDIUM DIFFICILE BY PCR     Status: Abnormal   Collection Time    11/14/13 12:44 PM      Result Value Ref Range Status   C difficile by pcr POSITIVE (*) NEGATIVE Final   Comment: CRITICAL RESULT CALLED TO, READ BACK BY AND VERIFIED WITH:     Bonne Dolores RN AT 2500 11/14/13 BY WOOLLENK     Studies: Dg Abd 1 View  11/20/2013   CLINICAL DATA:  Nasogastric tube placement.  EXAM: ABDOMEN - 1 VIEW  COMPARISON:  None.  FINDINGS: A nasogastric tube is been placed under fluoroscopic guidance with the tip located in the distal stomach.  IMPRESSION: Nasogastric tube placed with tip in distal stomach.   Electronically Signed   By: Aletta Edouard M.D.   On: 11/20/2013 10:04   Ct Abdomen Pelvis W Contrast  11/20/2013   CLINICAL DATA:  Diffuse abdominal pain  EXAM: CT ABDOMEN AND PELVIS WITH CONTRAST  TECHNIQUE: Multidetector CT imaging of the abdomen and pelvis was performed using the standard protocol following bolus administration of intravenous contrast.  CONTRAST:  161m OMNIPAQUE IOHEXOL 300 MG/ML  SOLN  COMPARISON:  DG ABD 1 VIEW dated 11/20/2013; DG ABD 1 VIEW dated 11/16/2013; DG CHEST 1V PORT dated 11/09/2013; CT ABD/PELV WO CM dated 10/26/2013  FINDINGS: Interval development of moderate sized right pleural effusion noted with right greater than left lower lobe atelectasis identified. This likely accounts for the subjectively nodular appearance of left lower lobe subpleural atelectasis on image 18, new since the recent prior exam. There is new amorphous hyperdensity within the right lower lobe  peripherally image 14 of unclear significance, possibly related to aspiration.  Contrast is present within nasogastric tube terminating within the stomach. There is now diffuse confluent colonic wall thickening to a maximal thickness of approximately 1 cm at the level of the cecum image 43. Trace colonic surrounding stranding is evident with trace fluid tracking into the pelvis. The most significant presumed  inflammatory changes are located about the cecum, as seen previously. No small bowel wall thickening or focal segmental dilatation is identified. The appendix is thought to be visualized at the base of the cecum image 60, grossly unremarkable in its visualized aspects.  Liver, gallbladder, adrenal glands, kidneys, spleen, and pancreas are normal. No lymphadenopathy.  There is an apparent intraluminal rectal mass measuring 3.2 x 1.7 cm image 75. No evidence for proximal obstruction is identified. Alternatively, this could represent focal wall thickening. Focal inpouching of the wall of the rectosigmoid colon is incidentally noted image 46 which could represent adhesion or redundant colon without evidence for obstruction. Given the appearance on the previous exam, this is felt to most likely represent an anatomic variant.  No new osseous abnormality. L4-L5 fusion hardware reidentified without evidence for hardware complication.  IMPRESSION: Interval progression of now diffuse colonic wall thickening, surrounding stranding, and pericolonic fluid tracking to the pelvis. Findings are most suggestive of infectious or inflammatory colitis such as may be seen with Clostridium difficile infection, ischemia, or less likely inflammatory bowel disease. No free air to suggest perforation.  A polypoid intraluminal masslike filling defect is noted at the level of the rectum which could represent malignancy such as colon cancer, although oval inpouching related to wall thickening could appear similar. After the patient's  current symptoms resolve, colonoscopy or sigmoidoscopy is recommended for further evaluation at direct visualization.  New moderate right pleural effusion with right greater than left lower lobe atelectasis.  New amorphous hyperdensity within the right lower lobe, which could indicate aspiration.  These results will be called to the ordering clinician or representative by the Radiologist Assistant, and communication documented in the PACS Dashboard.   Electronically Signed   By: Conchita Paris M.D.   On: 11/20/2013 16:09   Dg Chest Port 1 View  11/20/2013   CLINICAL DATA:  PICC placement.  EXAM: PORTABLE CHEST - 1 VIEW  COMPARISON:  11/16/2013  FINDINGS: Left PICC has been inserted and the tip is in good position in the superior vena cava approximately 17 mm below the carina.  Heart size and pulmonary vascularity are normal. There is an area of consolidation at the right lung base medially. Left lung is clear.  IMPRESSION: 1. PICC in good position. 2. Infiltrate at the right lung base posterior medially.   Electronically Signed   By: Rozetta Nunnery M.D.   On: 11/20/2013 18:27   Dg Addison Bailey G Tube Plc W/fl-no Rad  11/20/2013   CLINICAL DATA: unable to place NG by nursing staff   NASO G TUBE PLACEMENT WITH FLUORO  Fluoroscopy was utilized by the requesting physician.  No radiographic  interpretation.     Scheduled Meds: . buPROPion  150 mg Oral Daily  . chlorproMAZINE (THORAZINE) IV  25 mg Intravenous BID  . escitalopram  20 mg Oral Daily  . guaiFENesin  15 mL Per Tube 4 times per day  . lamoTRIgine  200 mg Oral Daily  . levothyroxine  88 mcg Oral QAC breakfast  . metoCLOPramide (REGLAN) injection  5 mg Intravenous 4 times per day  . vancomycin  500 mg Oral 4 times per day   And  . metronidazole  500 mg Intravenous 3 times per day  . ondansetron (ZOFRAN) IV  4 mg Intravenous 4 times per day  . pantoprazole (PROTONIX) IV  40 mg Intravenous Q12H  . peg 3350 powder  1 kit Per Tube Once  . QUEtiapine   100 mg Oral BID  .  saccharomyces boulardii  250 mg Oral BID  . sucralfate  1 g Oral TID AC & HS   Continuous Infusions: . dextrose 75 mL/hr at 11/20/13 2032    Principal Problem:   Aspiration pneumonia Active Problems:   Severe Clostridium difficile enterocolitis   Acute respiratory failure   Hematemesis   Dysphagia, pharyngoesophageal phase   H/O psychosis   Pulmonary hypertension   Duodenal ulcer, acute   Reflux esophagitis    Time spent: 40 minute   Lukus Binion, J  [facility] Pager [phone]. If 7PM-7AM, please contact night-coverage at [email or web address], password [facility] 11/21/2013, 2:19 PM  LOS: 13 days

## 2013-11-22 ENCOUNTER — Encounter (HOSPITAL_COMMUNITY): Admission: EM | Disposition: E | Payer: Self-pay | Source: Home / Self Care | Attending: Internal Medicine

## 2013-11-22 ENCOUNTER — Encounter (HOSPITAL_COMMUNITY): Payer: Self-pay

## 2013-11-22 DIAGNOSIS — D126 Benign neoplasm of colon, unspecified: Secondary | ICD-10-CM | POA: Diagnosis present

## 2013-11-22 DIAGNOSIS — K633 Ulcer of intestine: Secondary | ICD-10-CM

## 2013-11-22 DIAGNOSIS — Z86010 Personal history of colonic polyps: Secondary | ICD-10-CM

## 2013-11-22 DIAGNOSIS — K5289 Other specified noninfective gastroenteritis and colitis: Secondary | ICD-10-CM

## 2013-11-22 HISTORY — PX: COLONOSCOPY: SHX5424

## 2013-11-22 LAB — MAGNESIUM: Magnesium: 2 mg/dL (ref 1.5–2.5)

## 2013-11-22 LAB — COMPREHENSIVE METABOLIC PANEL
ALBUMIN: 1.8 g/dL — AB (ref 3.5–5.2)
ALK PHOS: 52 U/L (ref 39–117)
ALT: 7 U/L (ref 0–35)
AST: 12 U/L (ref 0–37)
BUN: 6 mg/dL (ref 6–23)
CALCIUM: 7.8 mg/dL — AB (ref 8.4–10.5)
CO2: 29 mEq/L (ref 19–32)
Chloride: 100 mEq/L (ref 96–112)
Creatinine, Ser: 0.93 mg/dL (ref 0.50–1.10)
GFR calc Af Amer: 75 mL/min — ABNORMAL LOW (ref >90)
GFR calc non Af Amer: 65 mL/min — ABNORMAL LOW (ref >90)
Glucose, Bld: 118 mg/dL — ABNORMAL HIGH (ref 70–99)
POTASSIUM: 2.8 meq/L — AB (ref 3.7–5.3)
SODIUM: 139 meq/L (ref 137–147)
Total Bilirubin: 0.2 mg/dL — ABNORMAL LOW (ref 0.3–1.2)
Total Protein: 4.4 g/dL — ABNORMAL LOW (ref 6.0–8.3)

## 2013-11-22 LAB — CBC WITH DIFFERENTIAL/PLATELET
BASOS PCT: 0 % (ref 0–1)
Basophils Absolute: 0 10*3/uL (ref 0.0–0.1)
EOS PCT: 2 % (ref 0–5)
Eosinophils Absolute: 0.1 10*3/uL (ref 0.0–0.7)
HEMATOCRIT: 23.8 % — AB (ref 36.0–46.0)
HEMOGLOBIN: 8.1 g/dL — AB (ref 12.0–15.0)
Lymphocytes Relative: 18 % (ref 12–46)
Lymphs Abs: 1.2 10*3/uL (ref 0.7–4.0)
MCH: 29.7 pg (ref 26.0–34.0)
MCHC: 34 g/dL (ref 30.0–36.0)
MCV: 87.2 fL (ref 78.0–100.0)
MONO ABS: 1.2 10*3/uL — AB (ref 0.1–1.0)
Monocytes Relative: 17 % — ABNORMAL HIGH (ref 3–12)
Neutro Abs: 4.2 10*3/uL (ref 1.7–7.7)
Neutrophils Relative %: 63 % (ref 43–77)
Platelets: 410 10*3/uL — ABNORMAL HIGH (ref 150–400)
RBC: 2.73 MIL/uL — ABNORMAL LOW (ref 3.87–5.11)
RDW: 19.8 % — ABNORMAL HIGH (ref 11.5–15.5)
WBC: 6.7 10*3/uL (ref 4.0–10.5)

## 2013-11-22 LAB — GLUCOSE, CAPILLARY
GLUCOSE-CAPILLARY: 127 mg/dL — AB (ref 70–99)
Glucose-Capillary: 102 mg/dL — ABNORMAL HIGH (ref 70–99)
Glucose-Capillary: 109 mg/dL — ABNORMAL HIGH (ref 70–99)
Glucose-Capillary: 114 mg/dL — ABNORMAL HIGH (ref 70–99)
Glucose-Capillary: 146 mg/dL — ABNORMAL HIGH (ref 70–99)

## 2013-11-22 SURGERY — COLONOSCOPY
Anesthesia: Moderate Sedation

## 2013-11-22 MED ORDER — OSMOLITE 1.2 CAL PO LIQD
1000.0000 mL | ORAL | Status: DC
  Administered 2013-11-22: 1000 mL
  Filled 2013-11-22 (×3): qty 1000

## 2013-11-22 MED ORDER — POTASSIUM CHLORIDE 10 MEQ/100ML IV SOLN
10.0000 meq | INTRAVENOUS | Status: AC
  Administered 2013-11-22 (×4): 10 meq via INTRAVENOUS
  Filled 2013-11-22 (×6): qty 100

## 2013-11-22 MED ORDER — FENTANYL CITRATE 0.05 MG/ML IJ SOLN
INTRAMUSCULAR | Status: AC
  Filled 2013-11-22: qty 2

## 2013-11-22 MED ORDER — SODIUM CHLORIDE 0.9 % IV SOLN: INTRAVENOUS | Status: DC

## 2013-11-22 MED ORDER — MIDAZOLAM HCL 5 MG/5ML IJ SOLN
INTRAMUSCULAR | Status: DC | PRN
  Administered 2013-11-22 (×2): 1 mg via INTRAVENOUS
  Administered 2013-11-22: 2 mg via INTRAVENOUS
  Administered 2013-11-22: 1 mg via INTRAVENOUS

## 2013-11-22 MED ORDER — FENTANYL CITRATE 0.05 MG/ML IJ SOLN
INTRAMUSCULAR | Status: DC | PRN
  Administered 2013-11-22 (×2): 25 ug via INTRAVENOUS

## 2013-11-22 MED ORDER — JEVITY 1.2 CAL PO LIQD: 1000.0000 mL | ORAL | Status: DC

## 2013-11-22 MED ORDER — POTASSIUM CHLORIDE 10 MEQ/100ML IV SOLN
10.0000 meq | INTRAVENOUS | Status: AC
  Administered 2013-11-22 (×2): 10 meq via INTRAVENOUS
  Filled 2013-11-22 (×2): qty 100

## 2013-11-22 MED ORDER — OSMOLITE 1.2 CAL PO LIQD
1000.0000 mL | ORAL | Status: DC
  Filled 2013-11-22 (×2): qty 1000

## 2013-11-22 MED ORDER — MIDAZOLAM HCL 5 MG/ML IJ SOLN
INTRAMUSCULAR | Status: AC
  Filled 2013-11-22: qty 2

## 2013-11-22 NOTE — Progress Notes (Signed)
[facility] PROGRESS NOTE  [name] [id number] DOB: [birth year] DOA: 11/15/2013 PCP: [name], MD  Assessment/Plan:  Acute respiratory failure:  -Secondary to aspiration pneumonia.  -clinically improved -Continue NG tube  Pulmonary hypertension -BP not within AHA guidelines continue metoprolol 12.5 mg BID    Upper GI bleed: -Patient has significant pathology; esophagitis, duodenal ulcer, duodenitis. See EGD report below -All medication to be administered via NG tube (carafate, PPI po bid, bid pepcid, florastor) -  Aspiration Pneumonia:  -Patient made n.p.o. secondary to refractory N./V. and increased risk of aspirating. -Patient has been without nutrition since admission on 3/1 consult nutrition and pharmacy for NG tube feeding.  Dysphagia:  -Patient has significant pathology; esophagitis, duodenal ulcer, duodenitis. See EGD report below -Patient has been without nutrition since admission on 3/1 consult nutrition and pharmacy for NG tube feeding. -  Depression  -continue Lexapro, Wellbutrin, via  NG tube   Hypothyroidism:  -Synthroid 88 mcg per NG tube    Hyperlipidemia:  -Stable.  -lipid panel within NCEP guidelines   Hypertension:  -BP not within AHA guidelines continue metoprolol 12.5 mg BID    C. difficile colitis:  -Continue Flagyl+ PO vancomycin. Will Consider 3/13 as first full day of treatment (3/13 NG tube placed and patient able to keep all medication down) -CT scan abdomen and pelvis completed; significant pathology to include possible neoplasm see results below.  -Dr. Delfin Edis (GI) conducted Colonoscopy on 3/15, which showed significant pathology see report below  Hx psychosis: -Likely being off of Seroquel, Lamictal and Lexapro for almost one week may be contributing factor to decreased me ntation and worsening swallowing. Continue via NG tube   Hypernatremia:  - Resolved -Will continue D5 1/2 normal saline 125 ml/hr, until  patient receiving feeding via NG tube and tolerating. -Check CMP in the a.m.   Hypokalemia  -3/14 replace k 6 runs secondary to refractory N./V., and patient having bowel prep for colonoscopy this evening   -3/15 replace potassium 10 meq x6 runs secondary to loss through NG tube/bowel preparation for her colonoscopy -Recheck in the a.m. [name]   Hypomagnesemia:  -3/15  resolved continue to monitor    Nausea vomiting  -NG tube placed 3/13 -Most likely secondary to gastroparesis + patient significant pathology i.e. sophagitis/duodenitis/duodenal ulcer.  -Continue scheduled Zofran  -Thorazine 25 mg BID -Reglan 5 mg QID       Code Status: Full code  Family Communication: None Disposition Plan: Resolution of refractory N./V.; Physical therapy recommending skilled nursing.    Consultants: Dr. Delfin Edis (GI)   Procedures: Colonoscopy with cold biopsy polypectomy and Colonoscopy with biopsy 11/25/2013 -Two sessile polyps ranging between 3-81mm in size were found in the descending colon; polypectomy was performed with cold forceps  -There was mild diverticulosis noted in the sigmoid colon  - .diffuse nonspecific colitis throughout the lef,t transverse and right colon consistent with resolving colitis, status post random biopsies ,no evidence of pseudomembrane  - Cecal ulcerations likely resolving colitis. Biopsies taken to rule out ischemic etiology versus pseudomembranous colitis    Abdomen pelvis with contrast 11/20/2013 -Interval progression of now diffuse colonic wall thickening, surrounding stranding, and pericolonic fluid tracking to the pelvis. Findings are most suggestive of infectious or inflammatory colitis  such as may be seen with Clostridium difficile infection, ischemia, or less likely inflammatory bowel disease. No free air to suggest perforation.  -A polypoid intraluminal masslike filling defect is noted level of the rectum which could represent malignancy such as colon  cancer, although oval inpouching related to wall thickening could  appear similar. After the patient's current symptoms resolve, colonoscopy or sigmoidoscopy is recommended for further evaluation at direct visualization.  -New moderate right pleural effusion with right greater than left lower lobe atelectasis.  -New amorphous hyperdensity within the right lower lobe, which could indicate aspiration.   EGD 11/29/2013 -severe grade 4 reflux esophagitis status post biopsies  -Mild gastric retention  -Small duodenal ulcer without stigmata of bleeding  -Moderately severe duodenitis status post biopsies to rule out H. Pylori   PICC/Midline 3/13>>  NG tube 3/13>>>  Intubation 3/2-3/5   NG tube 3/2-3/5   Right IJ central line 3/1-3/6  Echocardiogram 11/09/2013 - Left ventricle: The cavity size was normal. mild LVH.  -LVEF; 55% to 60%. - Atrial septum: No defect or patent foramen ovale was identified. - Pulmonary arteries: PA peak pressure: 6mm Hg (S).    Antibiotics: IV Zosyn 3/1-stopped 3/10 IV Flagyl 3/7>> PO vancomycin 3/7>>   HPI/Subjective: 63yo BF PMHx  hypertension, hypothyroidism and psychosis who was discharged on 2/25 after hospitalization for C. difficile colitis and we admitted on 3/1 for acute respiratory failure secondary to pneumonia. She was admitted to the critical care service and intubated. Echocardiogram checked and found to be unremarkable. Patient started on broad-spectrum antibiotics. By 3/5, she was able to be extubated and transferred to the hospitalist service in the step down unit starting 3/7. Trace pitting by speech therapy who initially recommended downgrading her diet to dysphagia with nectar thick liquids, however followup evaluation in 3/7 noted further downgraded to n.p.o. with meds only. Patient has been getting slightly more concentrated with increase in her sodium which was 150 by 3/7 and started on gentle half normal saline. C. difficile cultures  repeated and patient still positive. Still having some diarrhea. 3/11 patient states can not keep her vancomycin down. However talking to her RN when patient does not know she is taking vancomycin able to keep medication down. Currently n.p.o. scheduled for EGD today. 3/12 patient obtain EGD yesterday which showed significant pathology see report below. 3/13 patient feeling improved, negative N./V. since placement of NG tube 3/14 patient with only mild episodes of nausea, states she understands the plan outlined by Dr. Olevia Perches (GI) concerning the need for colonoscopy 3/15 patient states her N./V. has resolved the NG tube placement. Patient has tolerated contrast for CT scan, medications, and bowel prep for colonoscopy.   Objective: Filed Vitals:   11/14/2013 0935 11/23/2013 0940 11/29/2013 0945 11/18/2013 1111  BP: 126/75 138/63 131/67 118/63  Pulse: 95 94 95 97  Temp:    98.3 F (36.8 C)  TempSrc:    Oral  Resp: 26 17 18 18   Height:      Weight:      SpO2: 100% 100% 93% 97%    Intake/Output Summary (Last 24 hours) at 12/06/2013 1243 Last data filed at 11/27/2013 0813  Gross per 24 hour  Intake 1753.92 ml  Output   3700 ml  Net -1946.08 ml   Filed Weights   11/19/13 2105 11/20/13 0500 11/21/13 0500  Weight: 121.473 kg (267 lb 12.8 oz) 121.519 kg (267 lb 14.4 oz) 124.286 kg (274 lb)    Exam:   General:  A./O. x4, NAD     Cardiovascular: Regular rhythm and rate,, negative murmurs rubs gallops  Respiratory: Clear to auscultation bilateral  Abdomen: Obese, nontender, plus bowel sound (hypoactive)  Musculoskeletal: Positive 1+plus pedal edema bilateral to knee   Data Reviewed: Basic Metabolic  Panel:  Recent Labs Lab 03-Dec-2013 0722  11/19/13 0540 11/20/13 0630 11/20/13 1755 11/20/13 2330 11/21/13 0546 11/24/2013 0520  NA 147  --  144 142  --   --  139 139  K 2.9*  < > 2.6* 2.7* 2.8* 3.0* 3.0* 2.8*  CL 106  --  103 101  --   --  100 100  CO2 27  --  28 28  --   --  29 29  GLUCOSE  97  --  101* 105*  --   --  86 118*  BUN 11  --  10 9  --   --  7 6  CREATININE 1.04  --  1.03 0.95  --   --  0.95 0.93  CALCIUM 8.2*  --  7.8* 7.8*  --   --  7.7* 7.8*  MG 1.8  < > 1.5 1.9 2.1  --  1.9 2.0  < > = values in this interval not displayed. Liver Function Tests:  Recent Labs Lab 11/19/13 0540 11/20/13 0630 11/21/13 0546 11/28/2013 0520  AST 14 12 11 12   ALT 9 8 7 7   ALKPHOS 50 52 51 52  BILITOT 0.3 0.2* 0.3 0.2*  PROT 4.9* 4.7* 4.4* 4.4*  ALBUMIN 2.0* 1.9* 1.8* 1.8*    Recent Labs Lab 11/16/13 0644  LIPASE 47   No results found for this basename: AMMONIA,  in the last 168 hours CBC:  Recent Labs Lab 12/03/13 1000 11/19/13 0540 11/20/13 0630 11/21/13 0546 11/29/2013 0520  WBC 11.8* 9.6 10.5 9.2 6.7  NEUTROABS 9.5* 7.2 7.9* 6.8 4.2  HGB 9.0* 8.7* 8.4* 8.1* 8.1*  HCT 26.9* 25.5* 24.7* 23.6* 23.8*  MCV 87.9 88.9 88.2 88.1 87.2  PLT 378 402* 440* 397 410*   Cardiac Enzymes: No results found for this basename: CKTOTAL, CKMB, CKMBINDEX, TROPONINI,  in the last 168 hours BNP (last 3 results)  Recent Labs  11/20/2013 1512  PROBNP 1572.0*   CBG:  Recent Labs Lab 11/20/13 1153 11/21/13 2038 11/21/13 2359 11/28/2013 0421 11/25/2013 1109  GLUCAP 104* 95 102* 109* 146*    Recent Results (from the past 240 hour(s))  CLOSTRIDIUM DIFFICILE BY PCR     Status: Abnormal   Collection Time    11/14/13 12:44 PM      Result Value Ref Range Status   C difficile by pcr POSITIVE (*) NEGATIVE Final   Comment: CRITICAL RESULT CALLED TO, READ BACK BY AND VERIFIED WITH:     Bonne Dolores RN AT 4132 11/14/13 BY WOOLLENK     Studies: Ct Abdomen Pelvis W Contrast  11/20/2013   CLINICAL DATA:  Diffuse abdominal pain  EXAM: CT ABDOMEN AND PELVIS WITH CONTRAST  TECHNIQUE: Multidetector CT imaging of the abdomen and pelvis was performed using the standard protocol following bolus administration of intravenous contrast.  CONTRAST:  12mL OMNIPAQUE IOHEXOL 300 MG/ML  SOLN   COMPARISON:  DG ABD 1 VIEW dated 11/20/2013; DG ABD 1 VIEW dated 11/16/2013; DG CHEST 1V PORT dated 11/09/2013; CT ABD/PELV WO CM dated 10/26/2013  FINDINGS: Interval development of moderate sized right pleural effusion noted with right greater than left lower lobe atelectasis identified. This likely accounts for the subjectively nodular appearance of left lower lobe subpleural atelectasis on image 18, new since the recent prior exam. There is new amorphous hyperdensity within the right lower lobe peripherally image 14 of unclear significance, possibly related to aspiration.  Contrast is present within nasogastric tube terminating within the stomach. There is  now diffuse confluent colonic wall thickening to a maximal thickness of approximately 1 cm at the level of the cecum image 43. Trace colonic surrounding stranding is evident with trace fluid tracking into the pelvis. The most significant presumed inflammatory changes are located about the cecum, as seen previously. No small bowel wall thickening or focal segmental dilatation is identified. The appendix is thought to be visualized at the base of the cecum image 60, grossly unremarkable in its visualized aspects.  Liver, gallbladder, adrenal glands, kidneys, spleen, and pancreas are normal. No lymphadenopathy.  There is an apparent intraluminal rectal mass measuring 3.2 x 1.7 cm image 75. No evidence for proximal obstruction is identified. Alternatively, this could represent focal wall thickening. Focal inpouching of the wall of the rectosigmoid colon is incidentally noted image 46 which could represent adhesion or redundant colon without evidence for obstruction. Given the appearance on the previous exam, this is felt to most likely represent an anatomic variant.  No new osseous abnormality. L4-L5 fusion hardware reidentified without evidence for hardware complication.  IMPRESSION: Interval progression of now diffuse colonic wall thickening, surrounding stranding, and  pericolonic fluid tracking to the pelvis. Findings are most suggestive of infectious or inflammatory colitis such as may be seen with Clostridium difficile infection, ischemia, or less likely inflammatory bowel disease. No free air to suggest perforation.  A polypoid intraluminal masslike filling defect is noted at the level of the rectum which could represent malignancy such as colon cancer, although oval inpouching related to wall thickening could appear similar. After the patient's current symptoms resolve, colonoscopy or sigmoidoscopy is recommended for further evaluation at direct visualization.  New moderate right pleural effusion with right greater than left lower lobe atelectasis.  New amorphous hyperdensity within the right lower lobe, which could indicate aspiration.  These results will be called to the ordering clinician or representative by the Radiologist Assistant, and communication documented in the PACS Dashboard.   Electronically Signed   By: Conchita Paris M.D.   On: 11/20/2013 16:09   Dg Chest Port 1 View  11/20/2013   CLINICAL DATA:  PICC placement.  EXAM: PORTABLE CHEST - 1 VIEW  COMPARISON:  11/16/2013  FINDINGS: Left PICC has been inserted and the tip is in good position in the superior vena cava approximately 17 mm below the carina.  Heart size and pulmonary vascularity are normal. There is an area of consolidation at the right lung base medially. Left lung is clear.  IMPRESSION: 1. PICC in good position. 2. Infiltrate at the right lung base posterior medially.   Electronically Signed   By: Rozetta Nunnery M.D.   On: 11/20/2013 18:27    Scheduled Meds: . buPROPion  150 mg Oral Daily  . chlorproMAZINE (THORAZINE) IV  25 mg Intravenous BID  . escitalopram  20 mg Oral Daily  . guaiFENesin  15 mL Per Tube 4 times per day  . lamoTRIgine  200 mg Oral Daily  . levothyroxine  88 mcg Oral QAC breakfast  . metoCLOPramide (REGLAN) injection  5 mg Intravenous 4 times per day  . metoprolol  tartrate  12.5 mg Oral BID  . vancomycin  500 mg Oral 4 times per day   And  . metronidazole  500 mg Intravenous 3 times per day  . ondansetron (ZOFRAN) IV  4 mg Intravenous 4 times per day  . pantoprazole (PROTONIX) IV  40 mg Intravenous Q12H  . potassium chloride  10 mEq Intravenous Q1 Hr x 6  . QUEtiapine  100 mg Oral BID  . saccharomyces boulardii  250 mg Oral BID  . sucralfate  1 g Oral TID AC & HS   Continuous Infusions: . dextrose 5 % and 0.45% NaCl 125 mL/hr at 11/30/2013 1137    Principal Problem:   Aspiration pneumonia Active Problems:   Severe Clostridium difficile enterocolitis   Acute respiratory failure   Hematemesis   Dysphagia, pharyngoesophageal phase   H/O psychosis   Pulmonary hypertension   Duodenal ulcer, acute   Reflux esophagitis   Other and unspecified noninfectious gastroenteritis and colitis(558.9)   Ulceration of intestine   Benign neoplasm of colon    Time spent: 40 minute   WOODS, CURTIS, J  [facility] Pager [phone]. If 7PM-7AM, please contact night-coverage at [email or web address], password [facility] 12/08/2013, 12:43 PM  LOS: 14 days

## 2013-11-22 NOTE — Op Note (Signed)
[facility] [place], [id number]   COLONOSCOPY PROCEDURE REPORT  PATIENT: Sydney, Johnson  MR#: [id number] BIRTHDATE: 12-Oct-1950 , 62  yrs. old GENDER: Female ENDOSCOPIST: Lafayette Dragon, MD REFERRED BY:Dr Dia Crawford PROCEDURE DATE:  12/01/2013 PROCEDURE:   Colonoscopy with cold biopsy polypectomy and Colonoscopy with biopsy First Screening Colonoscopy - Avg.  risk and is 50 yrs.  old or older - No.  Prior Negative Screening - Now for repeat screening. N/A  History of Adenoma - Now for follow-up colonoscopy & has been > or = to 3 yrs.  Yes hx of adenoma.  Has been 3 or more years since last colonoscopy.  Polyps Removed Today? Yes. ASA CLASS:   Class III INDICATIONS:pseudomembranous colitis.  Abnormal CT scan suggesting mass in the sigmoid colon, history of adenomatous polyps in 2011. Diarrhea. MEDICATIONS: These medications were titrated to patient response per physician's verbal order, Fentanyl 50 mcg IV, and Versed 5 mg IV  DESCRIPTION OF PROCEDURE:   After the risks benefits and alternatives of the procedure were thoroughly explained, informed consent was obtained.  A digital rectal exam revealed decreased sphincter tone.   The Pentax Ped Colon [id number]  endoscope was introduced through the anus and advanced to the cecum, which was identified by both the appendix and ileocecal valve. No adverse events experienced.   The quality of the prep was adequate.  The instrument was then slowly withdrawn as the colon was fully examined.      COLON FINDINGS: Two smooth sessile polyps ranging between 3-67mm in size were found in the descending colon.  A polypectomy was performed with cold forceps.  The resection was complete and the polyp tissue was completely retrieved.   There was mild diverticulosis noted in the sigmoid colon with associated tortuosity. the entire mucosa of the colon was edematous with patches of erythema. Biopsies were  taken from the left colon. The cecal pouch and the ileocecal valve were ulcerated. There was intense erythema, friability and ulcerations within the cecal pouch. Multiple biopsies were taken from cecal pouch Retroflexed views revealed no abnormalities. The time to cecum=8 minutes 24 seconds.  Withdrawal time=12 minutes 40 seconds.  The scope was withdrawn and the procedure completed. COMPLICATIONS: There were no complications.  ENDOSCOPIC IMPRESSION: 1.   Two sessile polyps ranging between 3-24mm in size were found in the descending colon; polypectomy was performed with cold forceps 2.   There was mild diverticulosis noted in the sigmoid colon 3 .diffuse nonspecific colitis throughout the lef,t transverse and right colon consistent with resolving colitis, status post random biopsies ,no evidence of pseudomembrane 4. Cecal ulcerations likely resolving colitis. Biopsies taken to rule out ischemic etiology versus pseudomembranous colitis 5.decreased rectal sphincter tone RECOMMENDATIONS: 1.  Await biopsy results 2.  Continue current medications the CT scan abnormality the describing possible mass in the sigmoid, was a loop of edematous  folds in the sigmoid colon 3. repeat stool for C. difficile, the stool in the colon did not have the odor of C. difficile colitis eSigned:  Lafayette Dragon, MD 11/12/2013 9:36 AM   cc:   PATIENT NAME:  Sydney, Johnson MR#: [id number]

## 2013-11-22 NOTE — Progress Notes (Signed)
CRITICAL VALUE ALERT  Critical value received:  K = 2.8  Date of notification:  2013/12/21  Time of notification:  0705  Critical value read back:yes  Nurse who received alert:  K. Laurance Flatten  MD notified (1st page):  Dr. Sherral Hammers  Time of first page:  0720  Responding MD:  Dr. Sherral Hammers, new order to Epic  Time MD responded:  [phone]

## 2013-11-22 NOTE — Progress Notes (Signed)
Brief Nutrition Note  Consult received for enteral/tube feeding initiation and management.  Osmolite 1.5 via NG tube @ 20 ml/hr.   Monitor magnesium, potassium, and phosphorus daily for at least 3 days, MD to replete as needed, as pt is at risk for refeeding syndrome given ongoing po intolerance and lack of intake.  Adult Enteral Nutrition Protocol initiated. Full assessment to follow.  Admitting Dx: Acute respiratory failure nausea and abd pain abnormal CT scan, colitis  Body mass index is 44.25 kg/(m^2). Pt meets criteria for morbid obesity based on current BMI.  Labs:   Recent Labs Lab 11/20/13 0630 11/20/13 1755 11/20/13 2330 11/21/13 0546 11/28/2013 0520  NA 142  --   --  139 139  K 2.7* 2.8* 3.0* 3.0* 2.8*  CL 101  --   --  100 100  CO2 28  --   --  29 29  BUN 9  --   --  7 6  CREATININE 0.95  --   --  0.95 0.93  CALCIUM 7.8*  --   --  7.7* 7.8*  MG 1.9 2.1  --  1.9 2.0  GLUCOSE 105*  --   --  86 118*    Terrace Arabia RD, LDN

## 2013-11-22 NOTE — Progress Notes (Signed)
1830 11/28/2013 nsg Started on Osmolite gave 30cc clamped tube for an hour checked residual 40cc macular degeneration notified orders noted

## 2013-11-23 ENCOUNTER — Encounter (HOSPITAL_COMMUNITY): Payer: Self-pay | Admitting: Internal Medicine

## 2013-11-23 DIAGNOSIS — A020 Salmonella enteritis: Secondary | ICD-10-CM

## 2013-11-23 LAB — CBC WITH DIFFERENTIAL/PLATELET
BASOS PCT: 1 % (ref 0–1)
Basophils Absolute: 0 10*3/uL (ref 0.0–0.1)
EOS PCT: 2 % (ref 0–5)
Eosinophils Absolute: 0.1 10*3/uL (ref 0.0–0.7)
HCT: 23.9 % — ABNORMAL LOW (ref 36.0–46.0)
HEMOGLOBIN: 8.1 g/dL — AB (ref 12.0–15.0)
Lymphocytes Relative: 24 % (ref 12–46)
Lymphs Abs: 1.5 10*3/uL (ref 0.7–4.0)
MCH: 29.6 pg (ref 26.0–34.0)
MCHC: 33.9 g/dL (ref 30.0–36.0)
MCV: 87.2 fL (ref 78.0–100.0)
MONO ABS: 1.1 10*3/uL — AB (ref 0.1–1.0)
Monocytes Relative: 18 % — ABNORMAL HIGH (ref 3–12)
NEUTROS ABS: 3.5 10*3/uL (ref 1.7–7.7)
Neutrophils Relative %: 56 % (ref 43–77)
Platelets: 380 10*3/uL (ref 150–400)
RBC: 2.74 MIL/uL — ABNORMAL LOW (ref 3.87–5.11)
RDW: 19.8 % — ABNORMAL HIGH (ref 11.5–15.5)
WBC: 6.3 10*3/uL (ref 4.0–10.5)

## 2013-11-23 LAB — GLUCOSE, CAPILLARY
GLUCOSE-CAPILLARY: 123 mg/dL — AB (ref 70–99)
Glucose-Capillary: 103 mg/dL — ABNORMAL HIGH (ref 70–99)
Glucose-Capillary: 115 mg/dL — ABNORMAL HIGH (ref 70–99)
Glucose-Capillary: 102 mg/dL — ABNORMAL HIGH (ref 70–99)
Glucose-Capillary: 149 mg/dL — ABNORMAL HIGH (ref 70–99)

## 2013-11-23 LAB — COMPREHENSIVE METABOLIC PANEL
ALT: 7 U/L (ref 0–35)
AST: 12 U/L (ref 0–37)
Albumin: 1.7 g/dL — ABNORMAL LOW (ref 3.5–5.2)
Alkaline Phosphatase: 52 U/L (ref 39–117)
BUN: 4 mg/dL — ABNORMAL LOW (ref 6–23)
CO2: 29 mEq/L (ref 19–32)
Calcium: 7.7 mg/dL — ABNORMAL LOW (ref 8.4–10.5)
Chloride: 103 mEq/L (ref 96–112)
Creatinine, Ser: 0.91 mg/dL (ref 0.50–1.10)
GFR calc Af Amer: 77 mL/min — ABNORMAL LOW (ref >90)
GFR calc non Af Amer: 66 mL/min — ABNORMAL LOW (ref >90)
Glucose, Bld: 114 mg/dL — ABNORMAL HIGH (ref 70–99)
Potassium: 3.2 mEq/L — ABNORMAL LOW (ref 3.7–5.3)
Sodium: 141 mEq/L (ref 137–147)
Total Bilirubin: <0.2 mg/dL — ABNORMAL LOW (ref 0.3–1.2)
Total Protein: 4.4 g/dL — ABNORMAL LOW (ref 6.0–8.3)

## 2013-11-23 LAB — PHOSPHORUS: PHOSPHORUS: 2.3 mg/dL (ref 2.3–4.6)

## 2013-11-23 LAB — MAGNESIUM: Magnesium: 1.7 mg/dL (ref 1.5–2.5)

## 2013-11-23 MED ORDER — POTASSIUM CHLORIDE 10 MEQ/100ML IV SOLN
10.0000 meq | INTRAVENOUS | Status: AC
  Administered 2013-11-23 – 2013-11-24 (×5): 10 meq via INTRAVENOUS
  Filled 2013-11-23 (×5): qty 100

## 2013-11-23 MED ORDER — MAGNESIUM SULFATE 50 % IJ SOLN
3.0000 g | Freq: Once | INTRAVENOUS | Status: AC
  Administered 2013-11-23: 3 g via INTRAVENOUS
  Filled 2013-11-23: qty 6

## 2013-11-23 NOTE — Progress Notes (Signed)
Patient seen, examined, and I agree with the above documentation, including the assessment and plan. Esophageal bx negative for malignancy and consistent with reflux esophagitis, severe.  On PPI and carafate.  NGT in place which will promote reflux.  62 degree HOB at all times as possible Colon bx pending given ulcers seen in right colon, though this is likely sequela of recent infectious colitis Aspiration PNA treatment Agree with paramount need for nutrition either with TPN or post-pyloric feeding.

## 2013-11-23 NOTE — Progress Notes (Signed)
NUTRITION FOLLOW UP/CONSULT  Intervention:    Recommend resuming enteral nutrition, however trialing elemental formula to promote better absorption/digestion. Text paged Dr. Sherral Hammers with recommendations. Recommend initiation of Vital AF 1.2 at 20 ml/hr via NGT.  If pt able to tolerate, recommend advancement of 10 ml every 12 hours goal rate of 60 ml/hr. Goal rate will provide: 1728 kcal, 108 grams protein, 1168 ml free water.  If pt unable to tolerate, recommend post-pyloric tube placement and resumption of enteral nutrition.  Please see Adult Enteral Nutrition Protocol for gastric residual guidelines.  Continue monitoring magnesium, potassium, and phosphorus daily for at least 3 days and throughout advancement of nutrition support, MD to replete as needed, as pt is at risk for refeeding syndrome given ongoing po intolerance and lack of intake.  RD to continue to follow nutrition care plan.  Nutrition Dx:   Inadequate oral intake now related to GI distress AEB inability to tolerate PO's. Ongoing.  Goal:   Pt to meet >/= 90% of their estimated nutrition needs; not met  Monitor:   Diet advancement, labs, weight trends, I/O's  Assessment:   63 year old obese female with history of HTN, GERD, depression, obesity and lumbar spondylosis status post lumbar decompression surgery in January 2015. Just discharged 11/04/13 after treatment for c.diff colitis and salmonella enterocolitis. Hospitalization complicated by encephalopathy and acute renal failure (peak creatinine 3.03). Admitted 2/25 with respiratory failure thought due to combination PNA and HF.   Patient was extubated on 3/5. BSE on 3/6 with recommendations for Dysphagia 1 with Nectar Thick liquids. Pt has been on and off of a full liquid diet since 3/6. During RD visit, RN administered medications with juice, which were then instantly regurgitated violently by pt.  Pt underwent endoscopy 3/11. Findings consistent with severe reflux  esophagitis. Pt with likely gastroparesis. CT scan of abdomen worrisome for rectal mass and colitis. Pt underwent NGT placement per RN on 3/13 for low suction to prevent continuous reflux and possible aspiration.  Colonoscopy on 3/15 with findings of polyps, mild diverticulosis, and cecal ulcerations.  RD consulted to initiation enteral nutrition on 3/15. MD discontinued enteral nutrition yesterday afternoon. Per RN, pt was given 30 ml of Osmolite 1.2, tube was clamped, and pt had 40 ml of residual 1 hour later.  Potassium low, currently 3.2, requiring repletion. Magnesium and phosphorus WNL. CBG's WNL: 115, 103, 114 Meds include: D5 with 1/2 NS at 125 ml/hr, IV reglan, IV zofran, IV protonix, IV KCl, carafate, florastor  Height: Ht Readings from Last 1 Encounters:  11/17/13 5' 6" (1.676 m)    Weight Status:   Wt Readings from Last 1 Encounters:  11/09/2013 274 lb 4.8 oz (124.422 kg)  Admit wt 250 lb  Re-estimated needs:  Kcal: 1800-2100 Protein: 100-110 Fluid: 1.8-2.0 L/day  Skin:  Stage II to upper medial sacrum Stage II to mid medial sacrum Stage II to lower medial sacrum  Diet Order: NPO   Intake/Output Summary (Last 24 hours) at 11/23/13 0924 Last data filed at 11/23/13 0700  Gross per 24 hour  Intake 5157.09 ml  Output   2950 ml  Net 2207.09 ml    Last BM: 3/15  Labs:   Recent Labs Lab 11/21/13 0546 11/17/2013 0520 11/23/13 0430  NA 139 139 141  K 3.0* 2.8* 3.2*  CL 100 100 103  CO2 _0 BUN 7 6 4*  CREATININE 0.95 0.93 0.91  CALCIUM 7.7* 7.8* 7.7*  MG 1.9 2.0 1.7  PHOS  --   --  2.3  GLUCOSE 86 118* 114*    CBG (last 3)   Recent Labs  11/29/2013 2032 11/23/13 0410 11/23/13 0803  GLUCAP 114* 103* 115*    Scheduled Meds: . buPROPion  150 mg Oral Daily  . chlorproMAZINE (THORAZINE) IV  25 mg Intravenous BID  . escitalopram  20 mg Oral Daily  . guaiFENesin  15 mL Per Tube 4 times per day  . lamoTRIgine  200 mg Oral Daily  .  levothyroxine  88 mcg Oral QAC breakfast  . metoCLOPramide (REGLAN) injection  5 mg Intravenous 4 times per day  . metoprolol tartrate  12.5 mg Oral BID  . vancomycin  500 mg Oral 4 times per day   And  . metronidazole  500 mg Intravenous 3 times per day  . ondansetron (ZOFRAN) IV  4 mg Intravenous 4 times per day  . pantoprazole (PROTONIX) IV  40 mg Intravenous Q12H  . QUEtiapine  100 mg Oral BID  . saccharomyces boulardii  250 mg Oral BID  . sucralfate  1 g Oral TID AC & HS    Continuous Infusions: . dextrose 5 % and 0.45% NaCl 125 mL/hr at 11/23/13 0528    Inda Coke MS, RD, LDN Inpatient Registered Dietitian Pager: [phone] After-hours pager: [phone]

## 2013-11-23 NOTE — Clinical Social Work Note (Signed)
CSW continuing to monitor patient's progress and will assist with discharge back to [facility] when medically stable.  Chairty Toman Givens, MSW, LCSW [phone]

## 2013-11-23 NOTE — Progress Notes (Addendum)
[facility] PROGRESS NOTE  JASMINA INSANA [id number] DOB: [birth year] DOA: 11/26/2013 PCP: [name], MD  Assessment/Plan:  Acute respiratory failure:  -Secondary to aspiration pneumonia.  -clinically improved -Continue NG tube -3/16 patient scheduled for fluoroscopic placement of a Dobhoff tube in the a.m.  Pulmonary hypertension -BP within AHA guidelines continue metoprolol 12.5 mg BID    Upper GI bleed: -Patient has significant pathology; esophagitis, duodenal ulcer, duodenitis. See EGD report below -All medication to be administered via NG tube (carafate, PPI po bid, bid pepcid, florastor) -  Aspiration Pneumonia:  -Patient made n.p.o. secondary to refractory N./V. and increased risk of aspirating. -Patient has been without nutrition since admission on 3/1 consult nutrition and pharmacy for NG tube feeding. -3/16 unable to continue NG tube feeding secondary to large residuals. Will need to place Dobhoff tube in the a.m Ensuring termination in the duodenum   Dysphagia:  -Patient has significant pathology; esophagitis, duodenal ulcer, duodenitis. See EGD report below -Patient has been without nutrition since admission on 3/1 consult nutrition and pharmacy for NG tube feeding. -3/16 ASAP order placed for fluoroscopically guided placement of Dobhoff feeding to placement into the duodenum.  Depression  -continue Lexapro, Wellbutrin, via  NG tube   Hypothyroidism:  -Synthroid 88 mcg per NG tube    Hyperlipidemia:  -Stable.  -lipid panel within NCEP guidelines   Hypertension:  -BP not within AHA guidelines continue metoprolol 12.5 mg BID    C. difficile colitis:  -Continue Flagyl+ PO vancomycin. Will Consider 3/13 as first full day of treatment (3/13 NG tube placed and patient able to keep all medication down) -CT scan abdomen and pelvis completed; significant pathology to include possible neoplasm see results below.  -Dr. Delfin Edis (GI) conducted  Colonoscopy on 3/15, which showed significant pathology see report below, awaiting pathology reports  Hx psychosis: -Likely being off of Seroquel, Lamictal and Lexapro for almost one week may be contributing factor to decreased me ntation and worsening swallowing. Continue via NG tube   Hypernatremia:  - Resolved -Will continue D5 1/2 normal saline 125 ml/hr, until patient receiving feeding via NG tube and tolerating. -Check CMP in the a.m.   Hypokalemia  -3/14 replace k 6 runs secondary to refractory N./V., and patient having bowel prep for colonoscopy this evening   -3/15 replace potassium 10 meq x6 runs secondary to loss through NG tube/bowel preparation for her colonoscopy -3/16 replace potassium 10 meq x 5 runs -Recheck in the a.m. [name]   Hypomagnesemia:  -3/16   replete magnesium 3 gm IV    Nausea vomiting  -NG tube placed 3/13 -Most likely secondary to gastroparesis + patient significant pathology i.e. sophagitis/duodenitis/duodenal ulcer.  -Continue scheduled Zofran  -Thorazine 25 mg BID -Reglan 5 mg QID       Code Status: Full code  Family Communication: will contact daughter [name] in the a.m. [phone] Disposition Plan: Resolution of refractory N./V.; Physical therapy recommending skilled nursing.    Consultants: Dr. Delfin Edis (GI)   Procedures: Colonoscopy with cold biopsy polypectomy and Colonoscopy with biopsy 11/21/2013 -Two sessile polyps ranging between 3-93mm in size were found in the descending colon; polypectomy was performed with cold forceps  -There was mild diverticulosis noted in the sigmoid colon  - .diffuse nonspecific colitis throughout the lef,t transverse and right colon consistent with resolving colitis, status post random biopsies ,no evidence of pseudomembrane  - Cecal ulcerations likely resolving colitis. Biopsies taken to rule out ischemic etiology versus pseudomembranous colitis    Abdomen  pelvis with contrast 11/20/2013 -Interval  progression of now diffuse colonic wall thickening, surrounding stranding, and pericolonic fluid tracking to the pelvis. Findings are most suggestive of infectious or inflammatory colitis  such as may be seen with Clostridium difficile infection, ischemia, or less likely inflammatory bowel disease. No free air to suggest perforation.  -A polypoid intraluminal masslike filling defect is noted level of the rectum which could represent malignancy such as colon cancer, although oval inpouching related to wall thickening could  appear similar. After the patient's current symptoms resolve, colonoscopy or sigmoidoscopy is recommended for further evaluation at direct visualization.  -New moderate right pleural effusion with right greater than left lower lobe atelectasis.  -New amorphous hyperdensity within the right lower lobe, which could indicate aspiration.   EGD 12/08/2013 -severe grade 4 reflux esophagitis status post biopsies  -Mild gastric retention  -Small duodenal ulcer without stigmata of bleeding  -Moderately severe duodenitis status post biopsies to rule out H. Pylori   PICC/Midline 3/13>>  NG tube 3/13>>>  Intubation 3/2-3/5   NG tube 3/2-3/5   Right IJ central line 3/1-3/6  Echocardiogram 11/09/2013 - Left ventricle: The cavity size was normal. mild LVH.  -LVEF; 55% to 60%. - Atrial septum: No defect or patent foramen ovale was identified. - Pulmonary arteries: PA peak pressure: 4mm Hg (S).    Antibiotics: IV Zosyn 3/1-stopped 3/10 IV Flagyl 3/7>> PO vancomycin 3/7>>   HPI/Subjective: 62yo BF PMHx  hypertension, hypothyroidism and psychosis who was discharged on 2/25 after hospitalization for C. difficile colitis and we admitted on 3/1 for acute respiratory failure secondary to pneumonia. She was admitted to the critical care service and intubated. Echocardiogram checked and found to be unremarkable. Patient started on broad-spectrum antibiotics. By 3/5, she was able to be  extubated and transferred to the hospitalist service in the step down unit starting 3/7. Trace pitting by speech therapy who initially recommended downgrading her diet to dysphagia with nectar thick liquids, however followup evaluation in 3/7 noted further downgraded to n.p.o. with meds only. Patient has been getting slightly more concentrated with increase in her sodium which was 150 by 3/7 and started on gentle half normal saline. C. difficile cultures repeated and patient still positive. Still having some diarrhea. 3/11 patient states can not keep her vancomycin down. However talking to her RN when patient does not know she is taking vancomycin able to keep medication down. Currently n.p.o. scheduled for EGD today. 3/12 patient obtain EGD yesterday which showed significant pathology see report below. 3/13 patient feeling improved, negative N./V. since placement of NG tube 3/14 patient with only mild episodes of nausea, states she understands the plan outlined by Dr. Olevia Perches (GI) concerning the need for colonoscopy 3/15 patient states her N./V. has resolved the NG tube placement. Patient has tolerated contrast for CT scan, medications, and bowel prep for colonoscopy.   Objective: Filed Vitals:   11/19/2013 2038 11/21/2013 2100 11/23/13 0407 11/23/13 0735  BP: 138/84 127/76 120/68 127/80  Pulse: 98 92 90 90  Temp: 98 F (36.7 C) 98.5 F (36.9 C) 98.4 F (36.9 C) 99.4 F (37.4 C)  TempSrc: Oral Oral Oral Oral  Resp: 18 18 18 18   Height:      Weight:  124.422 kg (274 lb 4.8 oz)    SpO2: 97% 95% 97% 98%    Intake/Output Summary (Last 24 hours) at 11/23/13 1354 Last data filed at 11/23/13 0700  Gross per 24 hour  Intake 5007.09 ml  Output   2950  ml  Net 2057.09 ml   Filed Weights   11/20/13 0500 11/21/13 0500 11/24/2013 2100  Weight: 121.519 kg (267 lb 14.4 oz) 124.286 kg (274 lb) 124.422 kg (274 lb 4.8 oz)    Exam:   General:  A./O. x4, NAD     Cardiovascular: Regular rhythm and rate,,  negative murmurs rubs gallops  Respiratory: Clear to auscultation bilateral  Abdomen: Obese, nontender, plus bowel sound (hypoactive)  Musculoskeletal: Positive 1+plus pedal edema bilateral to knee   Data Reviewed: Basic Metabolic Panel:  Recent Labs Lab 11/19/13 0540 11/20/13 0630 11/20/13 1755 11/20/13 2330 11/21/13 0546 11/10/2013 0520 11/23/13 0430  NA 144 142  --   --  139 139 141  K 2.6* 2.7* 2.8* 3.0* 3.0* 2.8* 3.2*  CL 103 101  --   --  100 100 103  CO2 28 28  --   --  29 29 29   GLUCOSE 101* 105*  --   --  86 118* 114*  BUN 10 9  --   --  7 6 4*  CREATININE 1.03 0.95  --   --  0.95 0.93 0.91  CALCIUM 7.8* 7.8*  --   --  7.7* 7.8* 7.7*  MG 1.5 1.9 2.1  --  1.9 2.0 1.7  PHOS  --   --   --   --   --   --  2.3   Liver Function Tests:  Recent Labs Lab 11/19/13 0540 11/20/13 0630 11/21/13 0546 11/17/2013 0520 11/23/13 0430  AST 14 12 11 12 12   ALT 9 8 7 7 7   ALKPHOS 50 52 51 52 52  BILITOT 0.3 0.2* 0.3 0.2* <0.2*  PROT 4.9* 4.7* 4.4* 4.4* 4.4*  ALBUMIN 2.0* 1.9* 1.8* 1.8* 1.7*   No results found for this basename: LIPASE, AMYLASE,  in the last 168 hours No results found for this basename: AMMONIA,  in the last 168 hours CBC:  Recent Labs Lab 11/19/13 0540 11/20/13 0630 11/21/13 0546 11/29/2013 0520 11/23/13 0430  WBC 9.6 10.5 9.2 6.7 6.3  NEUTROABS 7.2 7.9* 6.8 4.2 3.5  HGB 8.7* 8.4* 8.1* 8.1* 8.1*  HCT 25.5* 24.7* 23.6* 23.8* 23.9*  MCV 88.9 88.2 88.1 87.2 87.2  PLT 402* 440* 397 410* 380   Cardiac Enzymes: No results found for this basename: CKTOTAL, CKMB, CKMBINDEX, TROPONINI,  in the last 168 hours BNP (last 3 results)  Recent Labs  11/30/2013 1512  PROBNP 1572.0*   CBG:  Recent Labs Lab 11/21/2013 1535 12/02/2013 2032 11/23/13 0410 11/23/13 0803 11/23/13 1140  GLUCAP 127* 114* 103* 115* 102*    Recent Results (from the past 240 hour(s))  CLOSTRIDIUM DIFFICILE BY PCR     Status: Abnormal   Collection Time    11/14/13 12:44 PM       Result Value Ref Range Status   C difficile by pcr POSITIVE (*) NEGATIVE Final   Comment: CRITICAL RESULT CALLED TO, READ BACK BY AND VERIFIED WITH:     Bonne Dolores RN AT 8657 11/14/13 BY WOOLLENK     Studies: No results found.  Scheduled Meds: . buPROPion  150 mg Oral Daily  . chlorproMAZINE (THORAZINE) IV  25 mg Intravenous BID  . escitalopram  20 mg Oral Daily  . guaiFENesin  15 mL Per Tube 4 times per day  . lamoTRIgine  200 mg Oral Daily  . levothyroxine  88 mcg Oral QAC breakfast  . metoCLOPramide (REGLAN) injection  5 mg Intravenous 4 times per day  .  metoprolol tartrate  12.5 mg Oral BID  . vancomycin  500 mg Oral 4 times per day   And  . metronidazole  500 mg Intravenous 3 times per day  . ondansetron (ZOFRAN) IV  4 mg Intravenous 4 times per day  . pantoprazole (PROTONIX) IV  40 mg Intravenous Q12H  . QUEtiapine  100 mg Oral BID  . saccharomyces boulardii  250 mg Oral BID  . sucralfate  1 g Oral TID AC & HS   Continuous Infusions: . dextrose 5 % and 0.45% NaCl 125 mL/hr at 11/23/13 1478    Principal Problem:   Aspiration pneumonia Active Problems:   Severe Clostridium difficile enterocolitis   Acute respiratory failure   Hematemesis   Dysphagia, pharyngoesophageal phase   H/O psychosis   Pulmonary hypertension   Duodenal ulcer, acute   Reflux esophagitis   Other and unspecified noninfectious gastroenteritis and colitis(558.9)   Ulceration of intestine   Benign neoplasm of colon    Time spent: 40 minute   WOODS, CURTIS, J  [facility] Pager [phone]. If 7PM-7AM, please contact night-coverage at [email or web address], password [facility] 11/23/2013, 1:54 PM  LOS: 15 days

## 2013-11-23 NOTE — Progress Notes (Signed)
Physical Therapy Treatment Patient Details Name: [name] MRN: [id number] DOB: 06-08-1951 Today's Date: 11/23/2013 Time: 2505-3976 PT Time Calculation (min): 13 min  PT Assessment / Plan / Recommendation  History of Present Illness 63 year old obese female with history of HTN, GERD, depression, obesity and lumbar spondylosis status post lumbar PLIF surgery in January 2015.  Just discharged 11/04/13 after treatment for c.diff colitis and salmonella enterocolitis. Hospitalization complicated by encephalopathy and acute renal failure (peak creatinine 3.03). Admitted 2/25 with respiratory failure though due to combination PNA and HF. Pt DC to [facility] after PLIF was home 1wk with falls and difficulty when she was readmitted last time then return to [facility] at that DC   PT Comments   Patient still limited with activity tolerance and fearful to walk in hallway due to weakness.  Will need rehab prior to d/c home.  She feels lot of weakness due to lack of nutrition.  Follow Up Recommendations  SNF           Equipment Recommendations  None recommended by PT    Recommendations for Other Services  None  Frequency Min 2X/week   Progress towards PT Goals Progress towards PT goals: Progressing toward goals  Plan Current plan remains appropriate    Precautions / Restrictions Precautions Precautions: Fall;Back   Pertinent Vitals/Pain No pain complaints    Mobility  Bed Mobility General bed mobility comments: up in chair Transfers Overall transfer level: Needs assistance Transfers: Sit to/from Stand Sit to Stand: Min assist Stand pivot transfers: Min assist General transfer comment: lifting assist from chair x 2 due to dizziness initially standing Ambulation/Gait Ambulation/Gait assistance: Min assist Ambulation Distance (Feet): 20 Feet Assistive device: Rolling walker (2 wheeled) Gait Pattern/deviations: Shuffle;Decreased stride length;Wide base of support General Gait  Details: c/o fatigue, unable to walk in hallway    Exercises General Exercises - Lower Extremity Long Arc Quad: AROM;Both;10 reps;Seated Hip Flexion/Marching: AROM;Both;5 reps;Seated Heel Raises: AROM;Both;10 reps;Seated    PT Goals (current goals can now be found in the care plan section)    Visit Information  Last PT Received On: 11/23/13 Assistance Needed: +1 History of Present Illness: 63 year old obese female with history of HTN, GERD, depression, obesity and lumbar spondylosis status post lumbar PLIF surgery in January 2015.  Just discharged 11/04/13 after treatment for c.diff colitis and salmonella enterocolitis. Hospitalization complicated by encephalopathy and acute renal failure (peak creatinine 3.03). Admitted 2/25 with respiratory failure though due to combination PNA and HF. Pt DC to [facility] after PLIF was home 1wk with falls and difficulty when she was readmitted last time then return to [facility] at that DC    Subjective Data      Cognition  Cognition Arousal/Alertness: Awake/alert Behavior During Therapy: WFL for tasks assessed/performed Overall Cognitive Status: Within Functional Limits for tasks assessed    Balance  Balance Overall balance assessment: Needs assistance Standing balance-Leahy Scale: Poor Standing balance comment: needs UE support for balance  End of Session PT - End of Session Equipment Utilized During Treatment: Gait belt Activity Tolerance: Patient limited by fatigue Patient left: in chair;with call bell/phone within reach   GP     [facility] 11/23/2013, 11:44 AM Magda Kiel, [place] 11/23/2013

## 2013-11-23 NOTE — Progress Notes (Signed)
Physical Therapy Treatment Patient Details Name: [name] MRN: [id number] DOB: Jul 07, 1951 Today's Date: 11/23/2013 Time: 0258-5277 PT Time Calculation (min): 16 min  PT Assessment / Plan / Recommendation  History of Present Illness 63 year old obese female with history of HTN, GERD, depression, obesity and lumbar spondylosis status post lumbar PLIF surgery in January 2015.  Just discharged 11/04/13 after treatment for c.diff colitis and salmonella enterocolitis. Hospitalization complicated by encephalopathy and acute renal failure (peak creatinine 3.03). Admitted 2/25 with respiratory failure though due to combination PNA and HF. Pt DC to [facility] after PLIF was home 1wk with falls and difficulty when she was readmitted last time then return to [facility] at that DC   PT Comments   Patient able to tolerate mobility with NG tube in place.  Limited due to incontinence and nurse tech in room to complete bath.  Will attempt later for increased ambulation as tolerated.  Follow Up Recommendations  SNF     Does the patient have the potential to tolerate intense rehabilitation   N/A  Barriers to Discharge  None      Equipment Recommendations  None recommended by PT    Recommendations for Other Services  None  Frequency Min 2X/week   Progress towards PT Goals Progress towards PT goals: Progressing toward goals  Plan Current plan remains appropriate    Precautions / Restrictions Precautions Precautions: Fall;Back   Pertinent Vitals/Pain No pain complaints    Mobility  Bed Mobility Overal bed mobility: Needs Assistance Bed Mobility: Rolling;Sidelying to Sit Rolling: Min assist Sidelying to sit: Min assist General bed mobility comments: use of rail to roll for hygiene and to sit up, assist for legs off bed Transfers Overall transfer level: Needs assistance Transfers: Sit to/from Stand Sit to Stand: Min assist General transfer comment: lifting assist from bed with cues for  hand placement Ambulation/Gait Ambulation/Gait assistance: Min assist Ambulation Distance (Feet): 12 Feet Assistive device: Rolling walker (2 wheeled) Gait Pattern/deviations: Wide base of support;Shuffle General Gait Details: increased lateral movement with wide BOS, walked around bed to chair for finishing bath with nurse tech in the room     PT Goals (current goals can now be found in the care plan section)    Visit Information  Last PT Received On: 11/23/13 Assistance Needed: +1 History of Present Illness: 63 year old obese female with history of HTN, GERD, depression, obesity and lumbar spondylosis status post lumbar PLIF surgery in January 2015.  Just discharged 11/04/13 after treatment for c.diff colitis and salmonella enterocolitis. Hospitalization complicated by encephalopathy and acute renal failure (peak creatinine 3.03). Admitted 2/25 with respiratory failure though due to combination PNA and HF. Pt DC [facility] after [facility]IF was home 1wk with falls and difficulty when she was readmitted last time then return to [facility] at that DC    Subjective Data      Cognition  Cognition Arousal/Alertness: Awake/alert Behavior During Therapy: WFL for tasks assessed/performed Overall Cognitive Status: Within Functional Limits for tasks assessed    Balance  Balance Overall balance assessment: Needs assistance Standing balance-Leahy Scale: Poor Standing balance comment: needs UE support for balance  End of Session PT - End of Session Equipment Utilized During Treatment: Gait belt Activity Tolerance: Patient tolerated treatment well Patient left: in chair;with call bell/phone within reach;with nursing/sitter in room   GP     [facility] 11/23/2013, 9:55 AM Magda Kiel, [place] 11/23/2013

## 2013-11-23 NOTE — Progress Notes (Signed)
Daily Rounding Note  11/23/2013, 10:39 AM  LOS: 15 days   SUBJECTIVE:       Did not tolerate 30 cc of osmolyte last night. Starts to feel full and bloated when NGT clamped for meds.  One loose stool this AM.  No abdominal pain.  Less coughing and throat clearing whwn sitting in chair.   OBJECTIVE:         Vital signs in last 24 hours:    Temp:  [98 F (36.7 C)-99.4 F (37.4 C)] 99.4 F (37.4 C) (03/16 0735) Pulse Rate:  [90-98] 90 (03/16 0735) Resp:  [18] 18 (03/16 0735) BP: (118-138)/(63-84) 127/80 mmHg (03/16 0735) SpO2:  [95 %-98 %] 98 % (03/16 0735) Weight:  [124.422 kg (274 lb 4.8 oz)] 124.422 kg (274 lb 4.8 oz) (03/15 2100) Last BM Date: 12/02/2013 General: looks well and comfortable.  Looks better. Seated in lounge chair.   NGT in place on right. Heart: rrr Chest: not coughing, voice noticeably stronger and less hoarse Abdomen: obese, soft, NT.  BS normal and not tympanitic or tinkling  Extremities: no CCE.  Neuro/Psych:  Pleasant, relaxed, no tremor. No extra pyramidal tremors.   Intake/Output from previous day: 03/15 0701 - 03/16 0700 In: 5157.1 [I.V.:3902.1; NG/GT:280; IV Piggyback:875] Out: 4750 [Urine:4050; Emesis/NG output:700]  Intake/Output this shift:    Lab Results:  Recent Labs  11/21/13 0546 11/08/2013 0520 11/23/13 0430  WBC 9.2 6.7 6.3  HGB 8.1* 8.1* 8.1*  HCT 23.6* 23.8* 23.9*  PLT 397 410* 380   BMET  Recent Labs  11/21/13 0546 11/24/2013 0520 11/23/13 0430  NA 139 139 141  K 3.0* 2.8* 3.2*  CL 100 100 103  CO2 29 29 29   GLUCOSE 86 118* 114*  BUN 7 6 4*  CREATININE 0.95 0.93 0.91  CALCIUM 7.7* 7.8* 7.7*   LFT  Recent Labs  11/21/13 0546 11/19/2013 0520 11/23/13 0430  PROT 4.4* 4.4* 4.4*  ALBUMIN 1.8* 1.8* 1.7*  AST 11 12 12   ALT 7 7 7   ALKPHOS 51 52 52  BILITOT 0.3 0.2* <0.2*   PT/INR No results found for this basename: LABPROT, INR,  in the last 72  hours Hepatitis Panel No results found for this basename: HEPBSAG, HCVAB, HEPAIGM, HEPBIGM,  in the last 72 hours  Studies/Results: No results found.  Scheduled Meds: . buPROPion  150 mg Oral Daily  . chlorproMAZINE (THORAZINE) IV  25 mg Intravenous BID  . escitalopram  20 mg Oral Daily  . guaiFENesin  15 mL Per Tube 4 times per day  . lamoTRIgine  200 mg Oral Daily  . levothyroxine  88 mcg Oral QAC breakfast  . metoCLOPramide (REGLAN) injection  5 mg Intravenous 4 times per day  . metoprolol tartrate  12.5 mg Oral BID  . vancomycin  500 mg Oral 4 times per day   And  . metronidazole  500 mg Intravenous 3 times per day  . ondansetron (ZOFRAN) IV  4 mg Intravenous 4 times per day  . pantoprazole (PROTONIX) IV  40 mg Intravenous Q12H  . QUEtiapine  100 mg Oral BID  . saccharomyces boulardii  250 mg Oral BID  . sucralfate  1 g Oral TID AC & HS   Continuous Infusions: . dextrose 5 % and 0.45% NaCl 125 mL/hr at 11/23/13 0528   PRN Meds:.albuterol, haloperidol lactate, RESOURCE THICKENUP CLEAR, sodium chloride   ASSESMENT:   *  Severe GERD with n/v, CG  emesis.   3/13 EGD: 11/20/2013: Severe reflux esophagitis, gastric retention, small DU, moderately severe duodenitis.  NGT remains in place.  On Carafate QID, Protonix IV bid, Reglan QID, BID Thorazine. Head CT 2/16 with stable, mild, chronic microvascular changes.  No narcotics in use.    *  Colitis.  C diff + (2/16, 3/7).  On IV flagyl, po vanco day 10 of 14. This is second course of same, first course was 08/2014. Treated with course of same for C diff in 08/2014. On Florastor.  Colonoscopy 3/15: sessile polyps removed (path pending), sigmoid tics, non-specific colitis left/transverse/right colon with cecal ulcerations looking like resolving colitis (path pending r/o ischemic vs PMC)  *  PNA, right lung, likely from aspiration. Zosyn 3/2 - 3/6. Zithromax 3/11 - 3/12.  Rocephin 3/11 - 3/12  *  Morbid obesity.   *   Hypothyroidism. Receiving oral Levothyroxone.    PLAN   *  ? Place post pyloric feeding tube and initiate tube feeds vs TNA. (has PICC line in left arm) *  Check TSH in AM    Azucena Freed  11/23/2013, 10:39 AM Pager: [phone]

## 2013-11-24 ENCOUNTER — Inpatient Hospital Stay (HOSPITAL_COMMUNITY): Payer: Medicare Other

## 2013-11-24 DIAGNOSIS — E41 Nutritional marasmus: Secondary | ICD-10-CM

## 2013-11-24 DIAGNOSIS — R1031 Right lower quadrant pain: Secondary | ICD-10-CM

## 2013-11-24 LAB — COMPREHENSIVE METABOLIC PANEL
ALT: 7 U/L (ref 0–35)
AST: 12 U/L (ref 0–37)
Albumin: 1.7 g/dL — ABNORMAL LOW (ref 3.5–5.2)
Alkaline Phosphatase: 54 U/L (ref 39–117)
BUN: 3 mg/dL — ABNORMAL LOW (ref 6–23)
CALCIUM: 7.6 mg/dL — AB (ref 8.4–10.5)
CHLORIDE: 102 meq/L (ref 96–112)
CO2: 29 mEq/L (ref 19–32)
Creatinine, Ser: 0.86 mg/dL (ref 0.50–1.10)
GFR calc Af Amer: 82 mL/min — ABNORMAL LOW (ref >90)
GFR, EST NON AFRICAN AMERICAN: 71 mL/min — AB (ref >90)
Glucose, Bld: 115 mg/dL — ABNORMAL HIGH (ref 70–99)
Potassium: 3 mEq/L — ABNORMAL LOW (ref 3.7–5.3)
Sodium: 140 mEq/L (ref 137–147)
Total Bilirubin: 0.2 mg/dL — ABNORMAL LOW (ref 0.3–1.2)
Total Protein: 4.6 g/dL — ABNORMAL LOW (ref 6.0–8.3)

## 2013-11-24 LAB — GLUCOSE, CAPILLARY
GLUCOSE-CAPILLARY: 109 mg/dL — AB (ref 70–99)
GLUCOSE-CAPILLARY: 89 mg/dL (ref 70–99)
GLUCOSE-CAPILLARY: 91 mg/dL (ref 70–99)
Glucose-Capillary: 108 mg/dL — ABNORMAL HIGH (ref 70–99)
Glucose-Capillary: 90 mg/dL (ref 70–99)
Glucose-Capillary: 102 mg/dL — ABNORMAL HIGH (ref 70–99)

## 2013-11-24 LAB — CBC WITH DIFFERENTIAL/PLATELET
BASOS PCT: 1 % (ref 0–1)
Basophils Absolute: 0 10*3/uL (ref 0.0–0.1)
Eosinophils Absolute: 0.1 10*3/uL (ref 0.0–0.7)
Eosinophils Relative: 2 % (ref 0–5)
HCT: 24 % — ABNORMAL LOW (ref 36.0–46.0)
HEMOGLOBIN: 8.1 g/dL — AB (ref 12.0–15.0)
Lymphocytes Relative: 25 % (ref 12–46)
Lymphs Abs: 1.6 10*3/uL (ref 0.7–4.0)
MCH: 29.6 pg (ref 26.0–34.0)
MCHC: 33.8 g/dL (ref 30.0–36.0)
MCV: 87.6 fL (ref 78.0–100.0)
MONO ABS: 1.2 10*3/uL — AB (ref 0.1–1.0)
MONOS PCT: 19 % — AB (ref 3–12)
NEUTROS PCT: 54 % (ref 43–77)
Neutro Abs: 3.3 10*3/uL (ref 1.7–7.7)
Platelets: 339 10*3/uL (ref 150–400)
RBC: 2.74 MIL/uL — ABNORMAL LOW (ref 3.87–5.11)
RDW: 19.8 % — ABNORMAL HIGH (ref 11.5–15.5)
WBC: 6.2 10*3/uL (ref 4.0–10.5)

## 2013-11-24 LAB — TSH: TSH: 3.334 u[IU]/mL (ref 0.350–4.500)

## 2013-11-24 LAB — MAGNESIUM: Magnesium: 2 mg/dL (ref 1.5–2.5)

## 2013-11-24 MED ORDER — POTASSIUM CHLORIDE 10 MEQ/100ML IV SOLN
10.0000 meq | INTRAVENOUS | Status: AC
  Administered 2013-11-24 (×6): 10 meq via INTRAVENOUS
  Filled 2013-11-24 (×6): qty 100

## 2013-11-24 MED ORDER — IOHEXOL 300 MG/ML  SOLN
50.0000 mL | Freq: Once | INTRAMUSCULAR | Status: AC | PRN
Start: 2013-11-24 — End: 2013-11-24
  Administered 2013-11-24: 20 mL

## 2013-11-24 MED ORDER — JEVITY 1.2 CAL PO LIQD
1000.0000 mL | ORAL | Status: DC
  Administered 2013-11-24: 1000 mL
  Filled 2013-11-24 (×3): qty 1000

## 2013-11-24 NOTE — Progress Notes (Signed)
OT Cancellation Note  Patient Details Name: GEARLINE SPILMAN MRN: [id number] DOB: [birth year]   Cancelled Treatment:    Reason Eval/Treat Not Completed: Patient at procedure or test/ unavailable  Daiva Huge Lorraine,COTA/L 11/24/2013, 11:35 AM

## 2013-11-24 NOTE — Progress Notes (Signed)
[facility] PROGRESS NOTE  ANIHYA TUMA EGB:[id number] DOB: [birth year] DOA: 11/09/2013 PCP: [name], MD  Assessment/Plan:  Acute respiratory failure:  -Secondary to aspiration pneumonia.  -clinically improved -Continue NG tube -3/17 patient  S/P fluoroscopic placement of a Dobhoff tube   Pulmonary hypertension -3/17 BP slightly outside AHA guidelines continue metoprolol 12.5 mg BID    Upper GI bleed: -Patient has significant pathology; esophagitis, duodenal ulcer, duodenitis. See EGD report below -All medication to be administered via NG tube (carafate, PPI po bid, bid pepcid, florastor) -  Aspiration Pneumonia:  -Patient made n.p.o. secondary to refractory N./V. and increased risk of aspirating. -Patient has been without nutrition since admission on 3/1 consult nutrition and pharmacy for NG tube feeding. -3/16 unable to continue NG tube feeding secondary to large residuals. Will need to place Dobhoff tube in the a.m Ensuring termination in the duodenum  -3/17 S/P IR placement of Dobhoff tube with termination in the duodenum -3/17 Start enteral feedings via Dobhoff, using enteral feeds for non- ICU patient protocol.  Dysphagia:  -Patient has significant pathology; esophagitis, duodenal ulcer, duodenitis. See EGD report below -Patient has been without nutrition since admission on 3/1 consult nutrition and pharmacy for NG tube feeding. -3/16 ASAP order placed for fluoroscopically guided placement of Dobhoff feeding to placement into the duodenum. -3./17 Dobhoff tube successfully place  Depression  -continue Lexapro, Wellbutrin, via  NG tube   Hypothyroidism:  -Synthroid 88 mcg per NG tube    Hyperlipidemia:  -Stable.  -lipid panel within NCEP guidelines   Hypertension:  -BP not within AHA guidelines continue metoprolol 12.5 mg BID    C. difficile colitis:  -Continue Flagyl+ PO vancomycin. Will Consider 3/13 as first full day of treatment (3/13 NG tube  placed and patient able to keep all medication down) -CT scan abdomen and pelvis completed; significant pathology to include possible neoplasm see results below.  -Dr. Delfin Edis (GI) conducted Colonoscopy on 3/15, which showed significant pathology see report below, awaiting pathology reports -Please contact daughter [name]  [phone] with results of pathology report  Hx psychosis: -Likely being off of Seroquel, Lamictal and Lexapro for almost one week may be contributing factor to decreased me ntation and worsening swallowing. Continue via NG tube   Hypernatremia:  - Resolved -3/17 continuedD5 1/2 normal saline 125 ml/hr, until patient tolerating enteral feeds via Dobhoff tube. -Check CMP in the a.m.   Hypokalemia  -3/14 replace k 6 runs secondary to refractory N./V., and patient having bowel prep for colonoscopy this evening   -3/15 replace potassium 10 meq x6 runs secondary to loss through NG tube/bowel preparation for her colonoscopy -3/16 replace potassium 10 meq x 5 runs -3/17 replace potassium 76meq x6 runs -Recheck in the a.m. . If continues to be problem reconsult nutrition/pharmacy to add potassium to enteral feeds   Hypomagnesemia:  -3/16  replete magnesium 3 gm IV    Nausea vomiting  -NG tube placed 3/13 -Most likely secondary to gastroparesis + patient significant pathology i.e. sophagitis/duodenitis/duodenal ulcer.  -Continue scheduled Zofran  -Thorazine 25 mg BID; after patient tolerating enteral feeds consider changing to PRN/DC -Reglan 5 mg QID       Code Status: Full code  Family Communication: Spoke with daughter [name]  [phone] and informed her of the plan of care today. Disposition Plan: Resolution of refractory N./V.; Physical therapy recommending skilled nursing.    Consultants: Dr. Delfin Edis (GI)   Procedures: Colonoscopy with cold biopsy polypectomy and Colonoscopy with biopsy 12/07/2013 -Two  sessile polyps ranging between 3-93mm in size  were found in the descending colon; polypectomy was performed with cold forceps  -There was mild diverticulosis noted in the sigmoid colon  - .diffuse nonspecific colitis throughout the lef,t transverse and right colon consistent with resolving colitis, status post random biopsies ,no evidence of pseudomembrane  - Cecal ulcerations likely resolving colitis. Biopsies taken to rule out ischemic etiology versus pseudomembranous colitis    Abdomen pelvis with contrast 11/20/2013 -Interval progression of now diffuse colonic wall thickening, surrounding stranding, and pericolonic fluid tracking to the pelvis. Findings are most suggestive of infectious or inflammatory colitis  such as may be seen with Clostridium difficile infection, ischemia, or less likely inflammatory bowel disease. No free air to suggest perforation.  -A polypoid intraluminal masslike filling defect is noted level of the rectum which could represent malignancy such as colon cancer, although oval inpouching related to wall thickening could  appear similar. After the patient's current symptoms resolve, colonoscopy or sigmoidoscopy is recommended for further evaluation at direct visualization.  -New moderate right pleural effusion with right greater than left lower lobe atelectasis.  -New amorphous hyperdensity within the right lower lobe, which could indicate aspiration.   EGD 2013-12-01 -severe grade 4 reflux esophagitis status post biopsies  -Mild gastric retention  -Small duodenal ulcer without stigmata of bleeding  -Moderately severe duodenitis status post biopsies to rule out H. Pylori   PICC/Midline 3/13>>  NG tube 3/13>>>  Intubation 3/2-3/5   NG tube 3/2-3/5   Right IJ central line 3/1-3/6  Echocardiogram 11/09/2013 - Left ventricle: The cavity size was normal. mild LVH.  -LVEF; 55% to 60%. - Atrial septum: No defect or patent foramen ovale was identified. - Pulmonary arteries: PA peak pressure: 81mm Hg  (S).    Antibiotics: IV Zosyn 3/1-stopped 3/10 IV Flagyl 3/7>> PO vancomycin 3/7>>   HPI/Subjective: 63yo BF PMHx  hypertension, hypothyroidism and psychosis who was discharged on 2/25 after hospitalization for C. difficile colitis and we admitted on 3/1 for acute respiratory failure secondary to pneumonia. She was admitted to the critical care service and intubated. Echocardiogram checked and found to be unremarkable. Patient started on broad-spectrum antibiotics. By 3/5, she was able to be extubated and transferred to the hospitalist service in the step down unit starting 3/7. Trace pitting by speech therapy who initially recommended downgrading her diet to dysphagia with nectar thick liquids, however followup evaluation in 3/7 noted further downgraded to n.p.o. with meds only. Patient has been getting slightly more concentrated with increase in her sodium which was 150 by 3/7 and started on gentle half normal saline. C. difficile cultures repeated and patient still positive. Still having some diarrhea. 3/11 patient states can not keep her vancomycin down. However talking to her RN when patient does not know she is taking vancomycin able to keep medication down. Currently n.p.o. scheduled for EGD today. 3/12 patient obtain EGD yesterday which showed significant pathology see report below. 3/13 patient feeling improved, negative N./V. since placement of NG tube 3/14 patient with only mild episodes of nausea, states she understands the plan outlined by Dr. Olevia Perches (GI) concerning the need for colonoscopy 3/15 patient states her N./V. has resolved the NG tube placement. Patient has tolerated contrast for CT scan, medications, and bowel prep for colonoscopy. 3/17 patient sleepy but arousable S/P placement of Dobhoff tube. Request that daughter [name]  [phone] be brought up-to-date on plan of care   Objective: Filed Vitals:   11/23/13 2035 11/24/13 0402 11/24/13 0900 11/24/13  1400  BP: 129/65  132/77 143/69 134/66  Pulse: 91 92 82 80  Temp: 98.5 F (36.9 C) 98.6 F (37 C) 98.8 F (37.1 C) 98.6 F (37 C)  TempSrc: Oral Oral Oral Oral  Resp: 17 16 18 16   Height:      Weight:      SpO2: 99% 100% 97% 98%    Intake/Output Summary (Last 24 hours) at 11/24/13 1603 Last data filed at 11/24/13 1500  Gross per 24 hour  Intake   1800 ml  Output   2900 ml  Net  -1100 ml   Filed Weights   11/20/13 0500 11/21/13 0500 12/03/2013 2100  Weight: 121.519 kg (267 lb 14.4 oz) 124.286 kg (274 lb) 124.422 kg (274 lb 4.8 oz)    Exam:   General:  A./O. x4, NAD     Cardiovascular: Regular rhythm and rate,, negative murmurs rubs gallops  Respiratory: Clear to auscultation bilateral  Abdomen: Obese, mild right lower quadrant tenderness to palpation, plus bowel sound (hypoactive)  Musculoskeletal: Positive 1+plus pedal edema bilateral to knee   Data Reviewed: Basic Metabolic Panel:  Recent Labs Lab 11/20/13 0630 11/20/13 1755 11/20/13 2330 11/21/13 0546 12/02/2013 0520 11/23/13 0430 11/24/13 0521  NA 142  --   --  139 139 141 140  K 2.7* 2.8* 3.0* 3.0* 2.8* 3.2* 3.0*  CL 101  --   --  100 100 103 102  CO2 28  --   --  29 29 29 29   GLUCOSE 105*  --   --  86 118* 114* 115*  BUN 9  --   --  7 6 4* 3*  CREATININE 0.95  --   --  0.95 0.93 0.91 0.86  CALCIUM 7.8*  --   --  7.7* 7.8* 7.7* 7.6*  MG 1.9 2.1  --  1.9 2.0 1.7 2.0  PHOS  --   --   --   --   --  2.3  --    Liver Function Tests:  Recent Labs Lab 11/20/13 0630 11/21/13 0546 11/19/2013 0520 11/23/13 0430 11/24/13 0521  AST 12 11 12 12 12   ALT 8 7 7 7 7   ALKPHOS 52 51 52 52 54  BILITOT 0.2* 0.3 0.2* <0.2* 0.2*  PROT 4.7* 4.4* 4.4* 4.4* 4.6*  ALBUMIN 1.9* 1.8* 1.8* 1.7* 1.7*   No results found for this basename: LIPASE, AMYLASE,  in the last 168 hours No results found for this basename: AMMONIA,  in the last 168 hours CBC:  Recent Labs Lab 11/20/13 0630 11/21/13 0546 11/24/2013 0520 11/23/13 0430  11/24/13 0521  WBC 10.5 9.2 6.7 6.3 6.2  NEUTROABS 7.9* 6.8 4.2 3.5 3.3  HGB 8.4* 8.1* 8.1* 8.1* 8.1*  HCT 24.7* 23.6* 23.8* 23.9* 24.0*  MCV 88.2 88.1 87.2 87.2 87.6  PLT 440* 397 410* 380 339   Cardiac Enzymes: No results found for this basename: CKTOTAL, CKMB, CKMBINDEX, TROPONINI,  in the last 168 hours BNP (last 3 results)  Recent Labs  11/17/2013 1512  PROBNP 1572.0*   CBG:  Recent Labs Lab 11/23/13 2026 11/24/13 0028 11/24/13 0401 11/24/13 0850 11/24/13 1120  GLUCAP 123* 109* 108* 90 89    No results found for this or any previous visit (from the past 240 hour(s)).   Studies: Dg Abd 1 View  11/24/2013   CLINICAL DATA:  Position of feeding tube  EXAM: ABDOMEN - 1 VIEW  COMPARISON:  CT abdomen pelvis of 11/20/2013  FINDINGS: A small amount  of contrast was injected into the feeding tube, documenting the tip of the tube to be near the ligament of Treitz in good position.  IMPRESSION: Tip of feeding tube near ligament of Treitz in good position   Electronically Signed   By: Ivar Drape M.D.   On: 11/24/2013 12:45    Scheduled Meds: . buPROPion  150 mg Oral Daily  . chlorproMAZINE (THORAZINE) IV  25 mg Intravenous BID  . escitalopram  20 mg Oral Daily  . guaiFENesin  15 mL Per Tube 4 times per day  . lamoTRIgine  200 mg Oral Daily  . levothyroxine  88 mcg Oral QAC breakfast  . metoCLOPramide (REGLAN) injection  5 mg Intravenous 4 times per day  . metoprolol tartrate  12.5 mg Oral BID  . vancomycin  500 mg Oral 4 times per day   And  . metronidazole  500 mg Intravenous 3 times per day  . ondansetron (ZOFRAN) IV  4 mg Intravenous 4 times per day  . pantoprazole (PROTONIX) IV  40 mg Intravenous Q12H  . QUEtiapine  100 mg Oral BID  . saccharomyces boulardii  250 mg Oral BID  . sucralfate  1 g Oral TID AC & HS   Continuous Infusions: . dextrose 5 % and 0.45% NaCl 125 mL/hr at 11/23/13 2308    Principal Problem:   Aspiration pneumonia Active Problems:   Severe  Clostridium difficile enterocolitis   Acute respiratory failure   Hematemesis   Dysphagia, pharyngoesophageal phase   H/O psychosis   Pulmonary hypertension   Duodenal ulcer, acute   Reflux esophagitis   Other and unspecified noninfectious gastroenteritis and colitis(558.9)   Ulceration of intestine   Benign neoplasm of colon    Time spent: 40 minute   Hanh Kertesz, J  [facility] Pager [phone]. If 7PM-7AM, please contact night-coverage at [email or web address], password [facility] 11/24/2013, 4:03 PM  LOS: 16 days

## 2013-11-24 NOTE — Clinical Social Work Note (Signed)
[facility] Intern B. Rosana Hoes contacted [facility] regarding patient discharge back to their facility when medically stable. Admissions director, Deirdre Pippins requested clinicals be sent to them for review. CSW transmitted [facility] and clinicals to SNF.  Adden Strout Givens, MSW, LCSW [phone]

## 2013-11-24 NOTE — Progress Notes (Signed)
Patient seen, examined, and I agree with the above documentation, including the assessment and plan. Postpyloric feeding tube placed successfully today. Okay to start tube feedings. Biopsies from right colonic ulcers most consistent with ischemic injury. This is likely due to illness and possible recent, now resolving infections (C. Difficile/Salmonella) Significant malnutrition, proceeding with tube feedings which hopefully will be tolerated. If not will need TPN Hemoglobin stable 1 small tubular adenoma in the colon. Prep was not excellent or good, and thus would consider outpatient colonoscopy once the patient has achieved a meaningful improvement and is fit for outpatient prep and proceed Will sign off, please call with questions

## 2013-11-24 NOTE — Progress Notes (Signed)
          Daily Rounding Note  11/24/2013, 8:38 AM  LOS: 16 days   SUBJECTIVE:       At most one BM yesterday, soft/loose/non-bloody  OBJECTIVE:         Vital signs in last 24 hours:    Temp:  [98 F (36.7 C)-98.6 F (37 C)] 98.6 F (37 C) (03/17 0402) Pulse Rate:  [82-92] 92 (03/17 0402) Resp:  [16-18] 16 (03/17 0402) BP: (124-134)/(65-80) 132/77 mmHg (03/17 0402) SpO2:  [97 %-100 %] 100 % (03/17 0402) Last BM Date: 11/23/13 General: NAD, comfortable, does not look ill   Heart: RRR Chest: clear.  No cough.  Voice less hoarse Abdomen: soft, some active BS, no tinkling or tympanitic BS.  NT  Extremities: no CCE Neuro/Psych:  Pleasant, moves all 4s but deconditioned and weak.   Intake/Output from previous day: 03/16 0701 - 03/17 0700 In: 1800 [I.V.:1350; IV Piggyback:450] Out: 1800 [Urine:1800]  Intake/Output this shift:    Lab Results:  Recent Labs  12/01/2013 0520 11/23/13 0430 11/24/13 0521  WBC 6.7 6.3 6.2  HGB 8.1* 8.1* 8.1*  HCT 23.8* 23.9* 24.0*  PLT 410* 380 339   BMET  Recent Labs  11/19/2013 0520 11/23/13 0430 11/24/13 0521  NA 139 141 140  K 2.8* 3.2* 3.0*  CL 100 103 102  CO2 29 29 29   GLUCOSE 118* 114* 115*  BUN 6 4* 3*  CREATININE 0.93 0.91 0.86  CALCIUM 7.8* 7.7* 7.6*   LFT  Recent Labs  12/04/2013 0520 11/23/13 0430 11/24/13 0521  PROT 4.4* 4.4* 4.6*  ALBUMIN 1.8* 1.7* 1.7*  AST 12 12 12   ALT 7 7 7   ALKPHOS 52 52 54  BILITOT 0.2* <0.2* 0.2*    ASSESMENT:   * Severe GERD with n/v, CG emesis. Nausea persists 3/13 EGD: 11/10/2013: Severe reflux esophagitis, gastric retention, small DU, moderately severe duodenitis.  NGT remains in place.  On Carafate QID, Protonix IV bid, Reglan QID, BID Thorazine. Head CT 2/16 with stable, mild, chronic microvascular changes.  No narcotics in use.  * Colitis. C diff + (2/16, 3/7). On IV flagyl, po vanco day 10 of 14. This is second course of  same, first course was 10/2013.  On Florastor.  Colonoscopy 3/15: sessile polyps removed (path pending), sigmoid tics, non-specific colitis left/transverse/right colon with cecal ulcerations looking like resolving colitis (path pending r/o ischemic vs PMC)  Diarrhea improved.  * PNA, right lung, likely from aspiration. Zosyn 3/2 - 3/6. Zithromax 3/11 - 3/12. Rocephin 3/11 - 3/12.  Cough much improved.   * Morbid obesity. If weights are reliable: she's gone from 250# on 3/1 to 274# on 3/15.  * Protein malnutrition due to prolonged lack of adequate po intake.  Post pyloric feeding tube slated for today, then trial TF.   Would let RD guide formula and rate.  * Hypothyroidism. Receiving oral Levothyroxone. TSH is pending.     PLAN   *  Place post pyloric feeding tube and begin enteral nutrition.  If this not tolerated will need to start TNA, already has PICC in place.  Answered several of pt's questions regarding this.      Azucena Freed  11/24/2013, 8:38 AM Pager: [phone]

## 2013-11-25 ENCOUNTER — Inpatient Hospital Stay (HOSPITAL_COMMUNITY): Payer: Medicare Other

## 2013-11-25 LAB — COMPREHENSIVE METABOLIC PANEL
ALT: 7 U/L (ref 0–35)
AST: 12 U/L (ref 0–37)
Albumin: 1.8 g/dL — ABNORMAL LOW (ref 3.5–5.2)
Alkaline Phosphatase: 64 U/L (ref 39–117)
BILIRUBIN TOTAL: 0.2 mg/dL — AB (ref 0.3–1.2)
BUN: 3 mg/dL — ABNORMAL LOW (ref 6–23)
CHLORIDE: 101 meq/L (ref 96–112)
CO2: 27 meq/L (ref 19–32)
CREATININE: 0.88 mg/dL (ref 0.50–1.10)
Calcium: 7.7 mg/dL — ABNORMAL LOW (ref 8.4–10.5)
GFR calc Af Amer: 80 mL/min — ABNORMAL LOW (ref >90)
GFR calc non Af Amer: 69 mL/min — ABNORMAL LOW (ref >90)
Glucose, Bld: 125 mg/dL — ABNORMAL HIGH (ref 70–99)
Potassium: 3.4 mEq/L — ABNORMAL LOW (ref 3.7–5.3)
Sodium: 139 mEq/L (ref 137–147)
Total Protein: 4.8 g/dL — ABNORMAL LOW (ref 6.0–8.3)

## 2013-11-25 LAB — CBC WITH DIFFERENTIAL/PLATELET
Basophils Absolute: 0 10*3/uL (ref 0.0–0.1)
Basophils Relative: 0 % (ref 0–1)
Eosinophils Absolute: 0.1 10*3/uL (ref 0.0–0.7)
Eosinophils Relative: 1 % (ref 0–5)
HCT: 25.5 % — ABNORMAL LOW (ref 36.0–46.0)
Hemoglobin: 8.6 g/dL — ABNORMAL LOW (ref 12.0–15.0)
Lymphocytes Relative: 26 % (ref 12–46)
Lymphs Abs: 1.3 10*3/uL (ref 0.7–4.0)
MCH: 30.2 pg (ref 26.0–34.0)
MCHC: 33.7 g/dL (ref 30.0–36.0)
MCV: 89.5 fL (ref 78.0–100.0)
Monocytes Absolute: 0.9 10*3/uL (ref 0.1–1.0)
Monocytes Relative: 17 % — ABNORMAL HIGH (ref 3–12)
Neutro Abs: 2.7 10*3/uL (ref 1.7–7.7)
Neutrophils Relative %: 55 % (ref 43–77)
Platelets: 318 10*3/uL (ref 150–400)
RBC: 2.85 MIL/uL — ABNORMAL LOW (ref 3.87–5.11)
RDW: 19.9 % — ABNORMAL HIGH (ref 11.5–15.5)
WBC: 5 10*3/uL (ref 4.0–10.5)

## 2013-11-25 LAB — GLUCOSE, CAPILLARY
Glucose-Capillary: 102 mg/dL — ABNORMAL HIGH (ref 70–99)
Glucose-Capillary: 103 mg/dL — ABNORMAL HIGH (ref 70–99)
Glucose-Capillary: 123 mg/dL — ABNORMAL HIGH (ref 70–99)
Glucose-Capillary: 131 mg/dL — ABNORMAL HIGH (ref 70–99)
Glucose-Capillary: 141 mg/dL — ABNORMAL HIGH (ref 70–99)
Glucose-Capillary: 95 mg/dL (ref 70–99)

## 2013-11-25 LAB — MAGNESIUM: MAGNESIUM: 1.7 mg/dL (ref 1.5–2.5)

## 2013-11-25 MED ORDER — HYDROCOD POLST-CHLORPHEN POLST 10-8 MG/5ML PO LQCR
5.0000 mL | Freq: Two times a day (BID) | ORAL | Status: DC
  Administered 2013-11-26 – 2013-12-11 (×30): 5 mL
  Filled 2013-11-25 (×30): qty 5

## 2013-11-25 MED ORDER — BENZONATATE 100 MG PO CAPS: 100.0000 mg | ORAL_CAPSULE | Freq: Three times a day (TID) | ORAL | Status: DC | PRN

## 2013-11-25 MED ORDER — OSMOLITE 1.2 CAL PO LIQD
1000.0000 mL | ORAL | Status: DC
  Administered 2013-11-25 – 2013-11-29 (×4): 1000 mL
  Filled 2013-11-25 (×13): qty 1000

## 2013-11-25 MED ORDER — ALBUTEROL SULFATE (2.5 MG/3ML) 0.083% IN NEBU
2.5000 mg | INHALATION_SOLUTION | RESPIRATORY_TRACT | Status: DC | PRN
  Administered 2013-11-25: 2.5 mg via RESPIRATORY_TRACT

## 2013-11-25 MED ORDER — PRO-STAT SUGAR FREE PO LIQD
30.0000 mL | Freq: Every day | ORAL | Status: DC
  Administered 2013-11-26 – 2013-12-02 (×7): 30 mL
  Filled 2013-11-25 (×7): qty 30

## 2013-11-25 MED ORDER — ALBUTEROL SULFATE (2.5 MG/3ML) 0.083% IN NEBU
INHALATION_SOLUTION | RESPIRATORY_TRACT | Status: AC
  Filled 2013-11-25: qty 3

## 2013-11-25 NOTE — Progress Notes (Addendum)
NUTRITION FOLLOW UP/CONSULT  Pt meets criteria for severe MALNUTRITION in the context of acute illness as evidenced by intake of <50% x at least 5 days and moderate muscle wasting.  Intervention:    Continue Osmolite 1.2 - RD to clarify order. Advance Osmolite 1.2 by 10 ml every 8 hours, to goal rate of 65 ml/hr. Add 30 ml Prostat daily. Goal rate will provide: 1972 kcal, 102 grams protein, 1279 ml free water.  If pt unable to tolerate, recommend TPN.  Continue monitoring magnesium, potassium, and phosphorus daily for at least 3 days and throughout advancement of nutrition support, MD to replete as needed, as pt is at risk for refeeding syndrome given ongoing po intolerance and lack of intake.  RD to continue to follow nutrition care plan.  Nutrition Dx:   Inadequate oral intake now related to GI distress AEB inability to tolerate PO's. Ongoing.  Goal:   Pt to meet >/= 90% of their estimated nutrition needs; unmet  Monitor:   TF advancement/tolerance, labs, weight trends, I/O's  Assessment:   63 year old obese female with history of HTN, GERD, depression, obesity and lumbar spondylosis status post lumbar decompression surgery in January 2015. Just discharged 11/04/13 after treatment for c.diff colitis and salmonella enterocolitis. Hospitalization complicated by encephalopathy and acute renal failure (peak creatinine 3.03). Admitted 2/25 with respiratory failure thought due to combination PNA and HF.   Patient was extubated on 3/5. BSE on 3/6 with recommendations for Dysphagia 1 with Nectar Thick liquids. Pt has been on and off of a full liquid diet since 3/6. Pt underwent endoscopy 3/11. Findings consistent with severe reflux esophagitis. Pt with likely gastroparesis. CT scan of abdomen worrisome for rectal mass and colitis. Pt underwent NGT placement per RN on 3/13 for low suction to prevent continuous reflux and possible aspiration.  Colonoscopy on 3/15 with findings of polyps, mild  diverticulosis, and cecal ulcerations.  RD consulted to initiation enteral nutrition on 3/15, feedings were initiated, however MD discontinued enteral nutrition later that afternoon; per RN, pt was given 30 ml of Osmolite 1.2, tube was clamped, and pt had 40 ml of residual 1 hour later.  Postpyloric feeding tube placement on 3/16, per notes - tip of feeding tube near ligament of Treitz in good position. Tube feeding initiated this morning. RD consulted to manage TF. Currently receiving Osmolite 1.2 at 50 ml/hr, however Jevity 1.2 is currently ordered (Higher education careers adviser of Unit.) RN reports that pt is tolerating well. Pt states that she has loose stools but wouldn't consider it diarrhea. Asking about when she can eat.  Pt with moderate muscle wasting in temples. Difficult to assess weight given fluid status.  Potassium low, currently 3.4, requiring repletion. Magnesium and phosphorus WNL. CBG's WNL: 123, 95, 103 Meds include: D5 with 1/2 NS at 125 ml/hr, IV reglan, IV zofran, IV protonix, IV KCl, carafate, florastor  Height: Ht Readings from Last 1 Encounters:  11/17/13 5\' 6"  (1.676 m)    Weight Status:   Wt Readings from Last 1 Encounters:  11/24/13 269 lb 12.8 oz (122.38 kg)  Admit wt 250 lb  Re-estimated needs:  Kcal: 1800 - 2100 Protein: 100 - 110 Fluid: 1.8 - 2 L/day  Skin:  Stage II to upper medial sacrum Stage II to mid medial sacrum Stage II to lower medial sacrum  Diet Order: NPO   Intake/Output Summary (Last 24 hours) at 11/25/13 0918 Last data filed at 11/25/13 0700  Gross per 24 hour  Intake 2917.33 ml  Output  2850 ml  Net  67.33 ml    Last BM: 3/18 - diarrhea  Labs:   Recent Labs Lab 11/23/13 0430 11/24/13 0521 11/25/13 0540  NA 141 140 139  K 3.2* 3.0* 3.4*  CL 103 102 101  CO2 29 29 27   BUN 4* 3* 3*  CREATININE 0.91 0.86 0.88  CALCIUM 7.7* 7.6* 7.7*  MG 1.7 2.0 1.7  PHOS 2.3  --   --   GLUCOSE 114* 115* 125*    CBG (last 3)   Recent  Labs  11/25/13 0010 11/25/13 0419 11/25/13 0806  GLUCAP 103* 95 123*    Scheduled Meds: . buPROPion  150 mg Oral Daily  . chlorproMAZINE (THORAZINE) IV  25 mg Intravenous BID  . escitalopram  20 mg Oral Daily  . guaiFENesin  15 mL Per Tube 4 times per day  . lamoTRIgine  200 mg Oral Daily  . levothyroxine  88 mcg Oral QAC breakfast  . metoCLOPramide (REGLAN) injection  5 mg Intravenous 4 times per day  . metoprolol tartrate  12.5 mg Oral BID  . vancomycin  500 mg Oral 4 times per day   And  . metronidazole  500 mg Intravenous 3 times per day  . ondansetron (ZOFRAN) IV  4 mg Intravenous 4 times per day  . pantoprazole (PROTONIX) IV  40 mg Intravenous Q12H  . QUEtiapine  100 mg Oral BID  . saccharomyces boulardii  250 mg Oral BID  . sucralfate  1 g Oral TID AC & HS    Continuous Infusions: . dextrose 5 % and 0.45% NaCl 125 mL/hr at 11/25/13 0700  . feeding supplement (JEVITY 1.2 CAL) 1,000 mL (11/25/13 0700)    Inda Coke MS, RD, LDN Inpatient Registered Dietitian Pager: [phone] After-hours pager: [phone]

## 2013-11-25 NOTE — Progress Notes (Signed)
[facility] PROGRESS NOTE  REKIA KUJALA VEL:[id number] DOB: 03-07-1951 DOA: 11/13/2013 PCP: [name], MD  Assessment/Plan:  Acute respiratory failure:  -Secondary to aspiration pneumonia.  -clinically improved  -Continue NG tube -3/17 patient S/P fluoroscopic placement of a Dobhoff tube    Upper GI bleed:  -Patient has significant pathology; esophagitis, duodenal ulcer, duodenitis. See EGD report below  -All medication to be administered via NG tube (carafate, PPI po bid, bid pepcid, florastor)   Aspiration Pneumonia:  -Patient made n.p.o. secondary to refractory N./V. and increased risk of aspirating.  -Patient has been without nutrition since admission on 3/1 consult nutrition and pharmacy for NG tube feeding.  -3/16 unable to continue NG tube feeding secondary to large residuals. Will need to place Dobhoff tube in the a.m Ensuring termination in the duodenum  -3/17 S/P IR placement of Dobhoff tube with termination in the duodenum  -3/17 Start enteral feedings via Dobhoff, using enteral feeds for non- ICU patient protocol.   Dysphagia:  -Patient has significant pathology; esophagitis, duodenal ulcer, duodenitis. See EGD report below  -Patient has been without nutrition since admission on 3/1 consult nutrition and pharmacy for NG tube feeding.  -3/16 ASAP order placed for fluoroscopically guided placement of Dobhoff feeding to placement into the duodenum.  -3./17 Dobhoff tube successfully place   Depression  -continue Lexapro, Wellbutrin, via NG tube   Hypothyroidism:  -Synthroid 88 mcg per NG tube   Hyperlipidemia:  -Stable.  -lipid panel within NCEP guidelines   Hypertension:  - Relatively well controlled on metoprolol 12.5 mg BID   C. difficile colitis:  -Continue Flagyl+ PO vancomycin. Will Consider 3/13 as first full day of treatment (3/13 NG tube placed and patient able to keep all medication down)  -CT scan abdomen and pelvis completed;  significant pathology to include possible neoplasm see results below.  -Dr. Delfin Edis (GI) conducted Colonoscopy on 3/15, which showed significant pathology see report below, awaiting pathology reports  -Please contact daughter [name] [phone] with results of pathology report  Hx psychosis:  -Likely being off of Seroquel, Lamictal and Lexapro for almost one week may be contributing factor to decreased mentation and worsening swallowing. Continue via NG tube   Hypernatremia:  - Resolved  -3/17 continued D5 1/2 normal saline 50 ml/hr, until patient tolerating enteral feeds via Dobhoff tube.  -Check CMP in the a.m.   Hypokalemia  -Will reassess next am.  Hypomagnesemia:  - Repleted and currently WNL  Nausea vomiting  -NG tube placed 3/13  -Most likely secondary to gastroparesis + patient significant pathology i.e. sophagitis/duodenitis/duodenal ulcer.  -Continue scheduled Zofran  -Thorazine 25 mg BID; after patient tolerating enteral feeds consider changing to PRN/DC  -Reglan 5 mg QID   Code Status: Full code  Family Communication: Spoke with daughter [name] [phone] and informed her of the plan of care today.  Disposition Plan: Resolution of refractory N./V.; Physical therapy recommending skilled nursing.    Consultants:  Gastroenterology  Antibiotics: Metronidazole Vancomycin  HPI/Subjective: Patient has no new complaints. No acute issues overnight.  Objective: Filed Vitals:   11/25/13 1321  BP: 126/70  Pulse: 88  Temp: 98 F (36.7 C)  Resp: 20    Intake/Output Summary (Last 24 hours) at 11/25/13 1502 Last data filed at 11/25/13 1406  Gross per 24 hour  Intake 3042.33 ml  Output   1450 ml  Net 1592.33 ml   Filed Weights   11/21/13 0500 11/21/2013 2100 11/24/13 2044  Weight: 124.286 kg (274 lb) 124.422  kg (274 lb 4.8 oz) 122.38 kg (269 lb 12.8 oz)    Exam:   General:  Pt in NAD, Alert and awake  Cardiovascular: RRR, no MRG  Respiratory: CTA  BL, no wheezes  Abdomen: soft, ND  Musculoskeletal: no cyanosis or clubbing   Data Reviewed: Basic Metabolic Panel:  Recent Labs Lab 11/21/13 0546 11/11/2013 0520 11/23/13 0430 11/24/13 0521 11/25/13 0540  NA 139 139 141 140 139  K 3.0* 2.8* 3.2* 3.0* 3.4*  CL 100 100 103 102 101  CO2 29 29 29 29 27   GLUCOSE 86 118* 114* 115* 125*  BUN 7 6 4* 3* 3*  CREATININE 0.95 0.93 0.91 0.86 0.88  CALCIUM 7.7* 7.8* 7.7* 7.6* 7.7*  MG 1.9 2.0 1.7 2.0 1.7  PHOS  --   --  2.3  --   --    Liver Function Tests:  Recent Labs Lab 11/21/13 0546 11/28/2013 0520 11/23/13 0430 11/24/13 0521 11/25/13 0540  AST 11 12 12 12 12   ALT 7 7 7 7 7   ALKPHOS 51 52 52 54 64  BILITOT 0.3 0.2* <0.2* 0.2* 0.2*  PROT 4.4* 4.4* 4.4* 4.6* 4.8*  ALBUMIN 1.8* 1.8* 1.7* 1.7* 1.8*   No results found for this basename: LIPASE, AMYLASE,  in the last 168 hours No results found for this basename: AMMONIA,  in the last 168 hours CBC:  Recent Labs Lab 11/21/13 0546 12/07/2013 0520 11/23/13 0430 11/24/13 0521 11/25/13 0540  WBC 9.2 6.7 6.3 6.2 5.0  NEUTROABS 6.8 4.2 3.5 3.3 2.7  HGB 8.1* 8.1* 8.1* 8.1* 8.6*  HCT 23.6* 23.8* 23.9* 24.0* 25.5*  MCV 88.1 87.2 87.2 87.6 89.5  PLT 397 410* 380 339 318   Cardiac Enzymes: No results found for this basename: CKTOTAL, CKMB, CKMBINDEX, TROPONINI,  in the last 168 hours BNP (last 3 results)  Recent Labs  11/30/2013 1512  PROBNP 1572.0*   CBG:  Recent Labs Lab 11/24/13 2044 11/25/13 0010 11/25/13 0419 11/25/13 0806 11/25/13 1158  GLUCAP 102* 103* 95 123* 141*    No results found for this or any previous visit (from the past 240 hour(s)).   Studies: Dg Abd 1 View  11/24/2013   CLINICAL DATA:  Position of feeding tube  EXAM: ABDOMEN - 1 VIEW  COMPARISON:  CT abdomen pelvis of 11/20/2013  FINDINGS: A small amount of contrast was injected into the feeding tube, documenting the tip of the tube to be near the ligament of Treitz in good position.  IMPRESSION:  Tip of feeding tube near ligament of Treitz in good position   Electronically Signed   By: Ivar Drape M.D.   On: 11/24/2013 12:45   Dg Addison Bailey G Tube Plc W/fl-no Rad  11/24/2013   CLINICAL DATA: Ensure tube terminates in the duodenum. Patient with severe  esophagitis/gastritis on able to swallow/maintain any food or fluids   NASO G TUBE PLACEMENT WITH FLUORO  Fluoroscopy was utilized by the requesting physician.  No radiographic  interpretation.     Scheduled Meds: . albuterol      . buPROPion  150 mg Oral Daily  . chlorpheniramine-HYDROcodone  5 mL Per Tube Q12H  . chlorproMAZINE (THORAZINE) IV  25 mg Intravenous BID  . escitalopram  20 mg Oral Daily  . feeding supplement (PRO-STAT SUGAR FREE 64)  30 mL Per Tube Daily  . guaiFENesin  15 mL Per Tube 4 times per day  . lamoTRIgine  200 mg Oral Daily  . levothyroxine  88 mcg  Oral QAC breakfast  . metoCLOPramide (REGLAN) injection  5 mg Intravenous 4 times per day  . metoprolol tartrate  12.5 mg Oral BID  . vancomycin  500 mg Oral 4 times per day   And  . metronidazole  500 mg Intravenous 3 times per day  . ondansetron (ZOFRAN) IV  4 mg Intravenous 4 times per day  . pantoprazole (PROTONIX) IV  40 mg Intravenous Q12H  . QUEtiapine  100 mg Oral BID  . saccharomyces boulardii  250 mg Oral BID  . sucralfate  1 g Oral TID AC & HS   Continuous Infusions: . dextrose 5 % and 0.45% NaCl 125 mL/hr at 11/25/13 0700  . feeding supplement (OSMOLITE 1.2 CAL) 1,000 mL (11/25/13 1009)    Principal Problem:   Aspiration pneumonia Active Problems:   Severe Clostridium difficile enterocolitis   Acute respiratory failure   Hematemesis   Dysphagia, pharyngoesophageal phase   H/O psychosis   Pulmonary hypertension   Duodenal ulcer, acute   Reflux esophagitis   Other and unspecified noninfectious gastroenteritis and colitis(558.9)   Ulceration of intestine   Benign neoplasm of colon    Time spent: > 35 minutes    Velvet Bathe  [facility] Pager [phone] If 7PM-7AM, please contact night-coverage at [email or web address], password [facility] 11/25/2013, 3:02 PM  LOS: 17 days

## 2013-11-25 NOTE — Progress Notes (Signed)
Physical Therapy Treatment Patient Details Name: [name] MRN: [id number] DOB: 08-Jul-1951 Today's Date: 11/25/2013 Time: 2595-6387 PT Time Calculation (min): 30 min  PT Assessment / Plan / Recommendation  History of Present Illness 63 year old obese female with history of HTN, GERD, depression, obesity and lumbar spondylosis status post lumbar PLIF surgery in January 2015.  Just discharged 11/04/13 after treatment for c.diff colitis and salmonella enterocolitis. Hospitalization complicated by encephalopathy and acute renal failure (peak creatinine 3.03). Admitted 2/25 with respiratory failure though due to combination PNA and HF. Pt DC to [facility] after PLIF was home 1wk with falls and difficulty when she was readmitted last time then return to [facility] at that DC   PT Comments   Pt slowly progressing toward therapy goals.   Follow Up Recommendations  SNF     Equipment Recommendations  None recommended by PT    Frequency Min 2X/week   Progress towards PT Goals Progress towards PT goals: Progressing toward goals  Plan Current plan remains appropriate    Precautions / Restrictions Precautions Precautions: Fall;Back Precaution Comments: pt able to verbalize 3/3 back precautions, max multimodal cues required to maintain during bed mobility. Restrictions Weight Bearing Restrictions: No    Mobility  Bed Mobility Overal bed mobility: Needs Assistance Rolling: Min assist General bed mobility comments: required max cues to log roll and not twist with coming up to side. minimal physical assist needed, with bed flat and rail used. supervison to scoot to edge of bed. Transfers Overall transfer level: Modified independent Equipment used: Rolling walker (2 wheeled) Transfers: Sit to/from Stand Sit to Stand: Min assist General transfer comment: cues on hand placement and ant weight shift to stand. cues to use arms to control descent with sitting down to  recliner. Ambulation/Gait Ambulation/Gait assistance: Min assist Ambulation Distance (Feet): 5 Feet Assistive device: Rolling walker (2 wheeled) Gait Pattern/deviations: Step-through pattern;Decreased step length - right;Decreased step length - left;Decreased stride length;Wide base of support;Trunk flexed Gait velocity: decreased General Gait Details: limited gait due to complaints of fatique (bed to recliiner only). cues on posture, walker position and to correct gait deviations as stated above.    Exercises General Exercises - Lower Extremity Ankle Circles/Pumps: AROM;Both;10 reps;Supine Quad Sets: AROM;Strengthening;Both;10 reps;Supine Heel Slides: Strengthening;AAROM;Both;10 reps;Supine Hip ABduction/ADduction: AAROM;Strengthening;Both;10 reps;Supine     PT Goals (current goals can now be found in the care plan section) Acute Rehab PT Goals Patient Stated Goal: be able to walk and return home PT Goal Formulation: With patient/family Time For Goal Achievement: 11/28/13 Potential to Achieve Goals: Fair  Visit Information  Last PT Received On: 11/25/13 Assistance Needed: +1 History of Present Illness: 63 year old obese female with history of HTN, GERD, depression, obesity and lumbar spondylosis status post lumbar PLIF surgery in January 2015.  Just discharged 11/04/13 after treatment for c.diff colitis and salmonella enterocolitis. Hospitalization complicated by encephalopathy and acute renal failure (peak creatinine 3.03). Admitted 2/25 with respiratory failure though due to combination PNA and HF. Pt DC to [facility] after PLIF was home 1wk with falls and difficulty when she was readmitted last time then return to [facility] at that DC    Subjective Data  Patient Stated Goal: be able to walk and return home   Cognition  Cognition Arousal/Alertness: Awake/alert Behavior During Therapy: WFL for tasks assessed/performed Overall Cognitive Status: Within Functional Limits for  tasks assessed       End of Session PT - End of Session Equipment Utilized During Treatment: Gait belt Activity Tolerance: Patient tolerated treatment  well Patient left: in chair;with call bell/phone within reach Nurse Communication: Mobility status   GP     [name] 11/25/2013, 10:47 AM

## 2013-11-25 NOTE — Progress Notes (Signed)
Occupational Therapy Treatment Patient Details Name: [name] MRN: [id number] DOB: 1951/04/15 Today's Date: 11/25/2013 Time: 1017-5102 OT Time Calculation (min): 34 min  OT Assessment / Plan / Recommendation  History of present illness 63 year old obese female with history of HTN, GERD, depression, obesity and lumbar spondylosis status post lumbar PLIF surgery in January 2015.  Just discharged 11/04/13 after treatment for c.diff colitis and salmonella enterocolitis. Hospitalization complicated by encephalopathy and acute renal failure (peak creatinine 3.03). Admitted 2/25 with respiratory failure though due to combination PNA and HF. Pt DC to [facility] after PLIF was home 1wk with falls and difficulty when she was readmitted last time then return to [facility] at that DC   OT comments  Pt making progress with functional goals and should continue with acute OT services to increase level of function and safety  Follow Up Recommendations  SNF;Supervision/Assistance - 24 hour    Barriers to Discharge   none    Equipment Recommendations  None recommended by OT    Recommendations for Other Services    Frequency Min 2X/week   Progress towards OT Goals Progress towards OT goals: Progressing toward goals  Plan Discharge plan remains appropriate    Precautions / Restrictions Precautions Precautions: Fall;Back Precaution Comments: pt able to verbalize 3/3 back precautions, max verbal/tactile cues required to maintain during  Restrictions Weight Bearing Restrictions: No   Pertinent Vitals/Pain No c/o pain    ADL  Grooming: Performed;Wash/dry face;Wash/dry hands;Teeth care;Set up;Supervision/safety Where Assessed - Grooming: Unsupported sitting Lower Body Bathing: Simulated;Minimal assistance;Moderate assistance Where Assessed - Lower Body Bathing: Lean right and/or left;Unsupported sitting Toilet Transfer: Performed;Minimal assistance Toilet Transfer Method: Sit to stand Toilet  Transfer Equipment: Bedside commode Toileting - Clothing Manipulation and Hygiene: Performed;Moderate assistance Where Assessed - Toileting Clothing Manipulation and Hygiene: Standing Transfers/Ambulation Related to ADLs: cues for hand placement    OT Diagnosis:    OT Problem List:   OT Treatment Interventions:     OT Goals(current goals can now be found in the care plan section) Acute Rehab OT Goals Patient Stated Goal: be able to walk and return home  Visit Information  Last OT Received On: 11/25/13 Assistance Needed: +1 History of Present Illness: 63 year old obese female with history of HTN, GERD, depression, obesity and lumbar spondylosis status post lumbar PLIF surgery in January 2015.  Just discharged 11/04/13 after treatment for c.diff colitis and salmonella enterocolitis. Hospitalization complicated by encephalopathy and acute renal failure (peak creatinine 3.03). Admitted 2/25 with respiratory failure though due to combination PNA and HF. Pt DC to [facility] after PLIF was home 1wk with falls and difficulty when she was readmitted last time then return to [facility] at that DC    Subjective Data      Prior Functioning       Cognition  Cognition Arousal/Alertness: Awake/alert Behavior During Therapy: WFL for tasks assessed/performed Overall Cognitive Status: Within Functional Limits for tasks assessed    Mobility  Bed Mobility Overal bed mobility: Needs Assistance Bed Mobility: Rolling;Sidelying to Sit Rolling: Min assist Sidelying to sit: Min assist Sit to supine: Mod assist Sit to sidelying: Mod assist General bed mobility comments: required max cues to log roll and not twist with coming up to side. minimal physical assist needed, with bed flat and rail used. supervison to scoot to edge of bed. Mod A with LEs back onto bed, able to use rails to scoot to [facility] Transfers Overall transfer level: Modified independent Equipment used: Rolling walker (2  wheeled) Transfers: Sit to/from  Stand Sit to Stand: Min assist General transfer comment: cues for hand placement    Exercises   B shoulder/elbow flexion and extension while seated EOB   Balance Balance Overall balance assessment: Needs assistance Sitting-balance support: No upper extremity supported;Feet supported Sitting balance-Leahy Scale: Good Sitting balance - Comments: ale to sit EOB unsupported for grooming/hygiene  End of Session OT - End of Session Equipment Utilized During Treatment: Rolling walker;Gait belt;Other (comment) (BSC) Activity Tolerance: Patient tolerated treatment well Patient left: in bed;with call bell/phone within reach  GO     [name] 11/25/2013, 3:01 PM

## 2013-11-25 NOTE — Progress Notes (Signed)
Patient vomited small amount looks like tube feed.Patient stated she felt better after vomiting.Denies nausea at this time.Patient residual per dobhoff  tube 1 cc . Will continue to monitor patient.

## 2013-11-26 LAB — CBC WITH DIFFERENTIAL/PLATELET
Basophils Absolute: 0 10*3/uL (ref 0.0–0.1)
Basophils Relative: 1 % (ref 0–1)
EOS PCT: 1 % (ref 0–5)
Eosinophils Absolute: 0.1 10*3/uL (ref 0.0–0.7)
HEMATOCRIT: 24.9 % — AB (ref 36.0–46.0)
HEMOGLOBIN: 8.3 g/dL — AB (ref 12.0–15.0)
LYMPHS ABS: 1.6 10*3/uL (ref 0.7–4.0)
Lymphocytes Relative: 25 % (ref 12–46)
MCH: 29.6 pg (ref 26.0–34.0)
MCHC: 33.3 g/dL (ref 30.0–36.0)
MCV: 88.9 fL (ref 78.0–100.0)
MONO ABS: 1.2 10*3/uL — AB (ref 0.1–1.0)
MONOS PCT: 19 % — AB (ref 3–12)
Neutro Abs: 3.5 10*3/uL (ref 1.7–7.7)
Neutrophils Relative %: 54 % (ref 43–77)
Platelets: 278 10*3/uL (ref 150–400)
RBC: 2.8 MIL/uL — AB (ref 3.87–5.11)
RDW: 20 % — ABNORMAL HIGH (ref 11.5–15.5)
WBC: 6.4 10*3/uL (ref 4.0–10.5)

## 2013-11-26 LAB — MAGNESIUM: Magnesium: 1.4 mg/dL — ABNORMAL LOW (ref 1.5–2.5)

## 2013-11-26 LAB — COMPREHENSIVE METABOLIC PANEL
ALBUMIN: 1.8 g/dL — AB (ref 3.5–5.2)
ALK PHOS: 72 U/L (ref 39–117)
ALT: 6 U/L (ref 0–35)
AST: 11 U/L (ref 0–37)
BUN: 6 mg/dL (ref 6–23)
CALCIUM: 7.8 mg/dL — AB (ref 8.4–10.5)
CO2: 27 mEq/L (ref 19–32)
CREATININE: 0.96 mg/dL (ref 0.50–1.10)
Chloride: 103 mEq/L (ref 96–112)
GFR calc Af Amer: 72 mL/min — ABNORMAL LOW (ref >90)
GFR calc non Af Amer: 62 mL/min — ABNORMAL LOW (ref >90)
Glucose, Bld: 100 mg/dL — ABNORMAL HIGH (ref 70–99)
Potassium: 3.3 mEq/L — ABNORMAL LOW (ref 3.7–5.3)
Sodium: 141 mEq/L (ref 137–147)
TOTAL PROTEIN: 4.8 g/dL — AB (ref 6.0–8.3)
Total Bilirubin: 0.2 mg/dL — ABNORMAL LOW (ref 0.3–1.2)

## 2013-11-26 LAB — GLUCOSE, CAPILLARY
GLUCOSE-CAPILLARY: 101 mg/dL — AB (ref 70–99)
GLUCOSE-CAPILLARY: 105 mg/dL — AB (ref 70–99)
GLUCOSE-CAPILLARY: 114 mg/dL — AB (ref 70–99)
GLUCOSE-CAPILLARY: 115 mg/dL — AB (ref 70–99)
GLUCOSE-CAPILLARY: 118 mg/dL — AB (ref 70–99)
Glucose-Capillary: 132 mg/dL — ABNORMAL HIGH (ref 70–99)

## 2013-11-26 MED ORDER — MAGNESIUM SULFATE IN D5W 10-5 MG/ML-% IV SOLN
1.0000 g | Freq: Once | INTRAVENOUS | Status: AC
  Administered 2013-11-26: 1 g via INTRAVENOUS
  Filled 2013-11-26: qty 100

## 2013-11-26 MED ORDER — POTASSIUM CHLORIDE 20 MEQ/15ML (10%) PO LIQD
40.0000 meq | Freq: Once | ORAL | Status: AC
  Administered 2013-11-26: 40 meq
  Filled 2013-11-26: qty 30

## 2013-11-26 NOTE — Progress Notes (Signed)
LUE edematous. LUE PICC gives good blood return and functions properly. Floor RN notified. Will continue to monitor. Lorri Frederick, RN IV Team

## 2013-11-26 NOTE — Progress Notes (Deleted)
[facility] PROGRESS NOTE  [name] KXF:[id number] DOB: 12/16/1950 DOA: 11/09/2013 PCP: [name], MD  Assessment/Plan:  Acute respiratory failure:  -Secondary to aspiration pneumonia.  -clinically improved  -3/17 patient S/P fluoroscopic placement of a Dobhoff tube   Upper GI bleed:  -Patient has significant pathology; esophagitis, duodenal ulcer, duodenitis. See EGD report below  -All medication to be administered via NG tube (carafate, PPI po bid, bid pepcid, florastor)   Aspiration Pneumonia:  -Patient made n.p.o. secondary to refractory N./V. and increased risk of aspirating.  -Patient has been without nutrition since admission on 3/1 nutrition and pharmacy consulted for NG tube feeding.  -3/17 S/P IR placement of Dobhoff tube with termination in the duodenum  -3/17 Start enteral feedings via Dobhoff, using enteral feeds for non- ICU patient protocol.   Dysphagia:  -Patient has significant pathology; esophagitis, duodenal ulcer, duodenitis. See EGD report below  -Patient was without nutrition since admission on 3/1. As such NG tube placed and patient has had enteral tube feeds. -3./17 Dobhoff tube successfully place   Depression  -continue Lexapro, Wellbutrin, via NG tube   Hypothyroidism:  -Synthroid 88 mcg per NG tube   Hyperlipidemia:  -Stable.  -lipid panel within NCEP guidelines   Hypertension:  - Relatively well controlled on metoprolol 12.5 mg BID   C. difficile colitis:  -Continue Flagyl+ PO vancomycin. Will Consider 3/13 as first full day of treatment (3/13 NG tube placed and patient able to keep all medication down)  -CT scan abdomen and pelvis completed; significant pathology to include possible neoplasm see results below.  -Dr. Delfin Edis (GI) conducted Colonoscopy on 3/15, which showed significant pathology see report below, awaiting pathology reports  -Please contact daughter [name] [phone] with results of pathology report  Hx  psychosis:  -Likely being off of Seroquel, Lamictal and Lexapro for almost one week was most likely contributing factor to decreased mentation and worsening swallowing. Continue via NG tube   Hypernatremia:  - Resolved  -3/17 discontinued D5 1/2 normal saline 50 ml/hr.  -Check CMP in the a.m.   Hypokalemia  - Will provide potassium 40 meq via NG tube -Will reassess next am.  Hypomagnesemia:  - Repleted and currently WNL  Nausea vomiting  -NG tube placed 3/13  -Most likely secondary to gastroparesis + patient significant pathology i.e. esophagitis/duodenitis/duodenal ulcer.  -Continue scheduled Zofran  -Thorazine 25 mg prn -Reglan 5 mg QID   Code Status: Full code  Family Communication: Spoke with daughter [name] [phone] and informed her of the plan of care today.  Disposition Plan: Resolution of refractory N./V.; Physical therapy recommending skilled nursing.    Consultants:  Gastroenterology  Antibiotics: Metronidazole Vancomycin  HPI/Subjective: Patient has no new complaints. No acute issues overnight.  States that she feels better today and has been coughing less.  Objective: Filed Vitals:   11/26/13 0959  BP: 104/56  Pulse: 98  Temp: 98.6 F (37 C)  Resp: 18    Intake/Output Summary (Last 24 hours) at 11/26/13 1125 Last data filed at 11/26/13 1100  Gross per 24 hour  Intake    930 ml  Output   2900 ml  Net  -1970 ml   Filed Weights   2013-12-15 2100 11/24/13 2044 11/25/13 2205  Weight: 124.422 kg (274 lb 4.8 oz) 122.38 kg (269 lb 12.8 oz) 123.8 kg (272 lb 14.9 oz)    Exam:   General:  Pt in NAD, Alert and awake  Cardiovascular: RRR, no MRG  Respiratory: CTA BL, no  wheezes  Abdomen: soft, ND  Musculoskeletal: no cyanosis or clubbing   Data Reviewed: Basic Metabolic Panel:  Recent Labs Lab 11/15/2013 0520 11/23/13 0430 11/24/13 0521 11/25/13 0540 11/26/13 0510  NA 139 141 140 139 141  K 2.8* 3.2* 3.0* 3.4* 3.3*  CL 100 103 102  101 103  CO2 29 29 29 27 27   GLUCOSE 118* 114* 115* 125* 100*  BUN 6 4* 3* 3* 6  CREATININE 0.93 0.91 0.86 0.88 0.96  CALCIUM 7.8* 7.7* 7.6* 7.7* 7.8*  MG 2.0 1.7 2.0 1.7 1.4*  PHOS  --  2.3  --   --   --    Liver Function Tests:  Recent Labs Lab 11/30/2013 0520 11/23/13 0430 11/24/13 0521 11/25/13 0540 11/26/13 0510  AST 12 12 12 12 11   ALT 7 7 7 7 6   ALKPHOS 52 52 54 64 72  BILITOT 0.2* <0.2* 0.2* 0.2* 0.2*  PROT 4.4* 4.4* 4.6* 4.8* 4.8*  ALBUMIN 1.8* 1.7* 1.7* 1.8* 1.8*   No results found for this basename: LIPASE, AMYLASE,  in the last 168 hours No results found for this basename: AMMONIA,  in the last 168 hours CBC:  Recent Labs Lab 11/13/2013 0520 11/23/13 0430 11/24/13 0521 11/25/13 0540 11/26/13 0510  WBC 6.7 6.3 6.2 5.0 6.4  NEUTROABS 4.2 3.5 3.3 2.7 3.5  HGB 8.1* 8.1* 8.1* 8.6* 8.3*  HCT 23.8* 23.9* 24.0* 25.5* 24.9*  MCV 87.2 87.2 87.6 89.5 88.9  PLT 410* 380 339 318 278   Cardiac Enzymes: No results found for this basename: CKTOTAL, CKMB, CKMBINDEX, TROPONINI,  in the last 168 hours BNP (last 3 results)  Recent Labs  11/27/2013 1512  PROBNP 1572.0*   CBG:  Recent Labs Lab 11/25/13 1706 11/25/13 2000 11/25/13 2359 11/26/13 0410 11/26/13 0813  GLUCAP 131* 102* 115* 101* 132*    No results found for this or any previous visit (from the past 240 hour(s)).   Studies: Dg Chest 2 View  11/25/2013   CLINICAL DATA:  Increased cough.  EXAM: CHEST  2 VIEW  COMPARISON:  DG CHEST 1V PORT dated 11/20/2013  FINDINGS: 1356 hr. Left arm PICC appears stable at the level of the lower SVC. A nasogastric tube projects into the proximal stomach. There is a new feeding tube which projects into the duodenum, tip not visualized. The heart size and mediastinal contours are stable. There is improved aeration of the lung bases with patchy dependent opacity inferiorly on the lateral view. There is no confluent airspace opacity or significant pleural effusion.  IMPRESSION:  Interval improved aeration of the lung bases with mild residual atelectasis. No consolidation. Support system position as above.   Electronically Signed   By: Camie Patience M.D.   On: 11/25/2013 16:41   Dg Abd 1 View  11/24/2013   CLINICAL DATA:  Position of feeding tube  EXAM: ABDOMEN - 1 VIEW  COMPARISON:  CT abdomen pelvis of 11/20/2013  FINDINGS: A small amount of contrast was injected into the feeding tube, documenting the tip of the tube to be near the ligament of Treitz in good position.  IMPRESSION: Tip of feeding tube near ligament of Treitz in good position   Electronically Signed   By: Ivar Drape M.D.   On: 11/24/2013 12:45   Dg Addison Bailey G Tube Plc W/fl-no Rad  11/24/2013   CLINICAL DATA: Ensure tube terminates in the duodenum. Patient with severe  esophagitis/gastritis on able to swallow/maintain any food or fluids   NASO G  TUBE PLACEMENT WITH FLUORO  Fluoroscopy was utilized by the requesting physician.  No radiographic  interpretation.     Scheduled Meds: . buPROPion  150 mg Oral Daily  . chlorpheniramine-HYDROcodone  5 mL Per Tube Q12H  . escitalopram  20 mg Oral Daily  . feeding supplement (PRO-STAT SUGAR FREE 64)  30 mL Per Tube Daily  . guaiFENesin  15 mL Per Tube 4 times per day  . lamoTRIgine  200 mg Oral Daily  . levothyroxine  88 mcg Oral QAC breakfast  . metoCLOPramide (REGLAN) injection  5 mg Intravenous 4 times per day  . metoprolol tartrate  12.5 mg Oral BID  . vancomycin  500 mg Oral 4 times per day   And  . metronidazole  500 mg Intravenous 3 times per day  . ondansetron (ZOFRAN) IV  4 mg Intravenous 4 times per day  . pantoprazole (PROTONIX) IV  40 mg Intravenous Q12H  . QUEtiapine  100 mg Oral BID  . saccharomyces boulardii  250 mg Oral BID  . sucralfate  1 g Oral TID AC & HS   Continuous Infusions: . dextrose 5 % and 0.45% NaCl 50 mL/hr at 11/25/13 1512  . feeding supplement (OSMOLITE 1.2 CAL) 1,000 mL (11/25/13 1009)    Principal Problem:   Aspiration  pneumonia Active Problems:   Severe Clostridium difficile enterocolitis   Acute respiratory failure   Hematemesis   Dysphagia, pharyngoesophageal phase   H/O psychosis   Pulmonary hypertension   Duodenal ulcer, acute   Reflux esophagitis   Other and unspecified noninfectious gastroenteritis and colitis(558.9)   Ulceration of intestine   Benign neoplasm of colon    Time spent: > 35 minutes    Velvet Bathe  [facility] Pager [phone] If 7PM-7AM, please contact night-coverage at [email or web address], password [facility] 11/26/2013, 11:25 AM  LOS: 18 days

## 2013-11-26 NOTE — Progress Notes (Signed)
NG tube removed from right nare per Dr. Wendee Beavers verbal orders.  Patient tolerated well.

## 2013-11-26 NOTE — Progress Notes (Signed)
[facility] PROGRESS NOTE  [name] [id number] DOB: 1950-12-13 DOA: 12/08/2013 PCP: [name], MD  Assessment/Plan:  Acute respiratory failure:  - Secondary to aspiration pneumonia.  - clinically improved  - Continue Dobhoff tube, d/c NGT on 3/19 - 3/17 patient S/P fluoroscopic placement of a Dobhoff tube   Upper GI bleed:  - Patient has significant pathology; esophagitis, duodenal ulcer, duodenitis. See EGD report below  - All medication to be administered via Dobhoff (carafate, PPI po bid, bid pepcid, florastor)   Aspiration Pneumonia:  - Patient made n.p.o. secondary to refractory N./V. and increased risk of aspirating.  - Patient had been without nutrition since admission on 3/1. Started on NGT feeds on 3/15 with consult nutrition and pharmacy for tube feedings.  - 3/16 unable to continue NG tube feeding secondary to large residuals.  - 3/17 S/P IR placement of Dobhoff tube with termination in the duodenum  - 3/17 Started enteral feedings via Dobhoff, using enteral feeds for non- ICU patient protocol.  - 3/19 no NGT residuals, D/c NGT, continue Dobbhoff feeds.  Dysphagia:  - Patient has significant pathology; esophagitis, duodenal ulcer, duodenitis. See EGD report below  - Patient had been without nutrition since admission on 3/1, see above - 3/16 ASAP order placed for fluoroscopically guided placement of Dobhoff feeding to placement into the duodenum.  - 3/17 Dobhoff tube successfully placed and feedings started, continue feeds - 3/19 no NGT residuals, D/c NGT.  Depression  -continue Lexapro, Wellbutrin, via tube   Hypothyroidism:  -Synthroid 88 mcg per tube   Hyperlipidemia:  - Stable.  - lipid panel within NCEP guidelines   Hypertension:  - Relatively well controlled on metoprolol 12.5 mg BID   C. difficile colitis:  - Continue Flagyl+ PO vancomycin. Will Consider 3/13 as first full day of treatment (3/13 NG tube placed and patient able to  keep all medication down)  - Recheck C Diff 3/19 - CT scan abdomen and pelvis completed; significant pathology to include possible neoplasm see results below.  - Dr. Delfin Edis (GI) conducted Colonoscopy on 3/15, which showed significant pathology see report below.  - GI recommends f/u colonoscopy outpatient (1 small tubular adenoma in colon).  Hx psychosis:  - Improving - Likely related to being off of Seroquel, Lamictal and Lexapro for almost one week. Continue medication via tube   Hypernatremia:  - Resolved  - Check CMP in the a.m.   Hypokalemia  -Will reassess next am with continued feeds.  Hypomagnesemia:  - Currently 1.4 on 3/19 - Replace today - Monitor  Nausea vomiting  - NG tube placed 3/13, d/c on 3/19  - Dobbhoff placed 3/17 - Most likely secondary to gastroparesis and patient significant pathology i.e. sophagitis/duodenitis/duodenal ulcer.  - Continue scheduled Zofran  - Reglan 5 mg QID   Code Status: Full code  Family Communication: Spoke with daughter [name] and informed her of the plan of care today.  Disposition Plan: Resolution of refractory N/V; Physical/occupational therapy recommending skilled nursing.    Consultants:  Gastroenterology  Antibiotics: Metronidazole- day 7 Vancomycin- day 7  HPI/Subjective: Patient has no new complaints. No acute issues overnight. Pt reports nausea is decreasing.  Objective: Filed Vitals:   11/26/13 0959  BP: 104/56  Pulse: 98  Temp: 98.6 F (37 C)  Resp: 18    Intake/Output Summary (Last 24 hours) at 11/26/13 1132 Last data filed at 11/26/13 1100  Gross per 24 hour  Intake    930 ml  Output  2900 ml  Net  -1970 ml   Filed Weights   11/13/2013 2100 11/24/13 2044 11/25/13 2205  Weight: 124.422 kg (274 lb 4.8 oz) 122.38 kg (269 lb 12.8 oz) 123.8 kg (272 lb 14.9 oz)    Exam:   General:  Pt in NAD, Alert and awake  Cardiovascular: RRR, no MRG  Respiratory: CTA BL, no wheezes  Abdomen: soft, ND.  + bowel sounds.   Musculoskeletal: no cyanosis or clubbing, 3+ edema to bilateral LE  Data Reviewed: Basic Metabolic Panel:  Recent Labs Lab 12/02/2013 0520 11/23/13 0430 11/24/13 0521 11/25/13 0540 11/26/13 0510  NA 139 141 140 139 141  K 2.8* 3.2* 3.0* 3.4* 3.3*  CL 100 103 102 101 103  CO2 29 29 29 27 27   GLUCOSE 118* 114* 115* 125* 100*  BUN 6 4* 3* 3* 6  CREATININE 0.93 0.91 0.86 0.88 0.96  CALCIUM 7.8* 7.7* 7.6* 7.7* 7.8*  MG 2.0 1.7 2.0 1.7 1.4*  PHOS  --  2.3  --   --   --    Liver Function Tests:  Recent Labs Lab 11/21/2013 0520 11/23/13 0430 11/24/13 0521 11/25/13 0540 11/26/13 0510  AST 12 12 12 12 11   ALT 7 7 7 7 6   ALKPHOS 52 52 54 64 72  BILITOT 0.2* <0.2* 0.2* 0.2* 0.2*  PROT 4.4* 4.4* 4.6* 4.8* 4.8*  ALBUMIN 1.8* 1.7* 1.7* 1.8* 1.8*   No results found for this basename: LIPASE, AMYLASE,  in the last 168 hours No results found for this basename: AMMONIA,  in the last 168 hours CBC:  Recent Labs Lab 11/19/2013 0520 11/23/13 0430 11/24/13 0521 11/25/13 0540 11/26/13 0510  WBC 6.7 6.3 6.2 5.0 6.4  NEUTROABS 4.2 3.5 3.3 2.7 3.5  HGB 8.1* 8.1* 8.1* 8.6* 8.3*  HCT 23.8* 23.9* 24.0* 25.5* 24.9*  MCV 87.2 87.2 87.6 89.5 88.9  PLT 410* 380 339 318 278   Cardiac Enzymes: No results found for this basename: CKTOTAL, CKMB, CKMBINDEX, TROPONINI,  in the last 168 hours BNP (last 3 results)  Recent Labs  11/18/2013 1512  PROBNP 1572.0*   CBG:  Recent Labs Lab 11/25/13 1706 11/25/13 2000 11/25/13 2359 11/26/13 0410 11/26/13 0813  GLUCAP 131* 102* 115* 101* 132*    No results found for this or any previous visit (from the past 240 hour(s)).   Studies: Dg Chest 2 View  11/25/2013   CLINICAL DATA:  Increased cough.  EXAM: CHEST  2 VIEW  COMPARISON:  DG CHEST 1V PORT dated 11/20/2013  FINDINGS: 1356 hr. Left arm PICC appears stable at the level of the lower SVC. A nasogastric tube projects into the proximal stomach. There is a new feeding tube  which projects into the duodenum, tip not visualized. The heart size and mediastinal contours are stable. There is improved aeration of the lung bases with patchy dependent opacity inferiorly on the lateral view. There is no confluent airspace opacity or significant pleural effusion.  IMPRESSION: Interval improved aeration of the lung bases with mild residual atelectasis. No consolidation. Support system position as above.   Electronically Signed   By: Camie Patience M.D.   On: 11/25/2013 16:41   Dg Abd 1 View  11/24/2013   CLINICAL DATA:  Position of feeding tube  EXAM: ABDOMEN - 1 VIEW  COMPARISON:  CT abdomen pelvis of 11/20/2013  FINDINGS: A small amount of contrast was injected into the feeding tube, documenting the tip of the tube to be near the ligament of  Treitz in good position.  IMPRESSION: Tip of feeding tube near ligament of Treitz in good position   Electronically Signed   By: Ivar Drape M.D.   On: 11/24/2013 12:45   Dg Addison Bailey G Tube Plc W/fl-no Rad  11/24/2013   CLINICAL DATA: Ensure tube terminates in the duodenum. Patient with severe  esophagitis/gastritis on able to swallow/maintain any food or fluids   NASO G TUBE PLACEMENT WITH FLUORO  Fluoroscopy was utilized by the requesting physician.  No radiographic  interpretation.     Scheduled Meds: . buPROPion  150 mg Oral Daily  . chlorpheniramine-HYDROcodone  5 mL Per Tube Q12H  . escitalopram  20 mg Oral Daily  . feeding supplement (PRO-STAT SUGAR FREE 64)  30 mL Per Tube Daily  . guaiFENesin  15 mL Per Tube 4 times per day  . lamoTRIgine  200 mg Oral Daily  . levothyroxine  88 mcg Oral QAC breakfast  . metoCLOPramide (REGLAN) injection  5 mg Intravenous 4 times per day  . metoprolol tartrate  12.5 mg Oral BID  . vancomycin  500 mg Oral 4 times per day   And  . metronidazole  500 mg Intravenous 3 times per day  . ondansetron (ZOFRAN) IV  4 mg Intravenous 4 times per day  . pantoprazole (PROTONIX) IV  40 mg Intravenous Q12H  .  QUEtiapine  100 mg Oral BID  . saccharomyces boulardii  250 mg Oral BID  . sucralfate  1 g Oral TID AC & HS   Continuous Infusions: . dextrose 5 % and 0.45% NaCl 50 mL/hr at 11/25/13 1512  . feeding supplement (OSMOLITE 1.2 CAL) 1,000 mL (11/25/13 1009)    Principal Problem:   Aspiration pneumonia Active Problems:   Severe Clostridium difficile enterocolitis   Acute respiratory failure   Hematemesis   Dysphagia, pharyngoesophageal phase   H/O psychosis   Pulmonary hypertension   Duodenal ulcer, acute   Reflux esophagitis   Other and unspecified noninfectious gastroenteritis and colitis(558.9)   Ulceration of intestine   Benign neoplasm of colon    Time spent: > 35 minutes    Larwance Sachs  [facility] Pager [phone] If 7PM-7AM, please contact night-coverage at [email or web address], password [facility] 11/26/2013, 11:32 AM  LOS: 18 days

## 2013-11-27 DIAGNOSIS — M7989 Other specified soft tissue disorders: Secondary | ICD-10-CM

## 2013-11-27 LAB — GLUCOSE, CAPILLARY
GLUCOSE-CAPILLARY: 90 mg/dL (ref 70–99)
GLUCOSE-CAPILLARY: 75 mg/dL (ref 70–99)
Glucose-Capillary: 100 mg/dL — ABNORMAL HIGH (ref 70–99)
Glucose-Capillary: 107 mg/dL — ABNORMAL HIGH (ref 70–99)
Glucose-Capillary: 109 mg/dL — ABNORMAL HIGH (ref 70–99)
Glucose-Capillary: 111 mg/dL — ABNORMAL HIGH (ref 70–99)

## 2013-11-27 LAB — PHOSPHORUS: PHOSPHORUS: 2.7 mg/dL (ref 2.3–4.6)

## 2013-11-27 LAB — CLOSTRIDIUM DIFFICILE BY PCR: CDIFFPCR: NEGATIVE

## 2013-11-27 LAB — COMPREHENSIVE METABOLIC PANEL
ALT: 6 U/L (ref 0–35)
AST: 10 U/L (ref 0–37)
Albumin: 1.6 g/dL — ABNORMAL LOW (ref 3.5–5.2)
Alkaline Phosphatase: 86 U/L (ref 39–117)
BUN: 8 mg/dL (ref 6–23)
CALCIUM: 7.6 mg/dL — AB (ref 8.4–10.5)
CO2: 29 mEq/L (ref 19–32)
CREATININE: 0.94 mg/dL (ref 0.50–1.10)
Chloride: 106 mEq/L (ref 96–112)
GFR calc non Af Amer: 64 mL/min — ABNORMAL LOW (ref >90)
GFR, EST AFRICAN AMERICAN: 74 mL/min — AB (ref >90)
GLUCOSE: 112 mg/dL — AB (ref 70–99)
Potassium: 3.5 mEq/L — ABNORMAL LOW (ref 3.7–5.3)
SODIUM: 142 meq/L (ref 137–147)
Total Bilirubin: <0.2 mg/dL — ABNORMAL LOW (ref 0.3–1.2)
Total Protein: 4.5 g/dL — ABNORMAL LOW (ref 6.0–8.3)

## 2013-11-27 LAB — CBC WITH DIFFERENTIAL/PLATELET
BASOS PCT: 1 % (ref 0–1)
Basophils Absolute: 0 10*3/uL (ref 0.0–0.1)
Eosinophils Absolute: 0.1 10*3/uL (ref 0.0–0.7)
Eosinophils Relative: 2 % (ref 0–5)
HCT: 23.1 % — ABNORMAL LOW (ref 36.0–46.0)
HEMOGLOBIN: 7.7 g/dL — AB (ref 12.0–15.0)
LYMPHS ABS: 1.6 10*3/uL (ref 0.7–4.0)
Lymphocytes Relative: 27 % (ref 12–46)
MCH: 30.1 pg (ref 26.0–34.0)
MCHC: 33.3 g/dL (ref 30.0–36.0)
MCV: 90.2 fL (ref 78.0–100.0)
MONOS PCT: 16 % — AB (ref 3–12)
Monocytes Absolute: 0.9 10*3/uL (ref 0.1–1.0)
NEUTROS ABS: 3.1 10*3/uL (ref 1.7–7.7)
NEUTROS PCT: 54 % (ref 43–77)
Platelets: 217 10*3/uL (ref 150–400)
RBC: 2.56 MIL/uL — AB (ref 3.87–5.11)
RDW: 20.4 % — ABNORMAL HIGH (ref 11.5–15.5)
WBC: 5.7 10*3/uL (ref 4.0–10.5)

## 2013-11-27 LAB — RETICULOCYTES
RBC.: 2.74 MIL/uL — AB (ref 3.87–5.11)
RETIC CT PCT: 2.4 % (ref 0.4–3.1)
Retic Count, Absolute: 65.8 10*3/uL (ref 19.0–186.0)

## 2013-11-27 LAB — MAGNESIUM: Magnesium: 1.5 mg/dL (ref 1.5–2.5)

## 2013-11-27 MED ORDER — HEPARIN (PORCINE) IN NACL 100-0.45 UNIT/ML-% IJ SOLN
1600.0000 [IU]/h | INTRAMUSCULAR | Status: DC
  Administered 2013-11-27: 1450 [IU]/h via INTRAVENOUS
  Administered 2013-11-28 (×2): 1250 [IU]/h via INTRAVENOUS
  Administered 2013-11-29: 1050 [IU]/h via INTRAVENOUS
  Administered 2013-11-29: 1250 [IU]/h via INTRAVENOUS
  Administered 2013-11-30 – 2013-12-02 (×3): 1050 [IU]/h via INTRAVENOUS
  Administered 2013-12-03: 1150 [IU]/h via INTRAVENOUS
  Administered 2013-12-04 – 2013-12-05 (×3): 1350 [IU]/h via INTRAVENOUS
  Filled 2013-11-27 (×13): qty 250

## 2013-11-27 MED ORDER — HEPARIN BOLUS VIA INFUSION
2500.0000 [IU] | Freq: Once | INTRAVENOUS | Status: AC
  Administered 2013-11-27: 2500 [IU] via INTRAVENOUS
  Filled 2013-11-27: qty 2500

## 2013-11-27 MED ORDER — POTASSIUM CHLORIDE CRYS ER 20 MEQ PO TBCR
40.0000 meq | EXTENDED_RELEASE_TABLET | Freq: Once | ORAL | Status: AC
  Administered 2013-11-27: 40 meq via ORAL
  Filled 2013-11-27: qty 2

## 2013-11-27 NOTE — Progress Notes (Signed)
*  Preliminary Results* Left upper extremity venous duplex completed. Left upper extremity is positive for deep and superficial vein thrombosis involving the left subclavian, axillary, brachial, and cephalic veins. There is thrombus seen surrounding the PICC line throughout all visualized segments.  Preliminary results discussed with Otila Kluver, RN.  11/27/2013 5:05 PM  Maudry Mayhew, RVT, RDCS, RDMS

## 2013-11-27 NOTE — Progress Notes (Signed)
Md made aware of pt's edema present in the left arm where her PICC line was placed. New orders received. Will cont to monitor.

## 2013-11-27 NOTE — Progress Notes (Signed)
Md was made aware that pt's doppler of her left upper ext was positive for a thrombosis. New orders received and carried out. Will cont to monitor.

## 2013-11-27 NOTE — Progress Notes (Signed)
Clamped NG tube, pt to be on clear liquids

## 2013-11-27 NOTE — Progress Notes (Signed)
NUTRITION FOLLOW UP  Pt meets criteria for severe MALNUTRITION in the context of acute illness as evidenced by intake of <50% x at least 5 days and moderate muscle wasting.  Intervention:    If unable to tolerate PO's, recommend resuming Osmolite 1.2 at 65 ml/hr and 30 ml Prostat daily. Goal rate provides: 1972 kcal, 102 grams protein, 1279 ml free water.  Further diet advancement per team.  RD to continue to follow nutrition care plan.  Nutrition Dx:   Inadequate oral intake now related to GI distress AEB inability to tolerate PO's. Ongoing.  Goal:   Pt to meet >/= 90% of their estimated nutrition needs; met.  Monitor:   TF advancement/tolerance, labs, weight trends, I/O's, po diet advancement  Assessment:   63 year old obese female with history of HTN, GERD, depression, obesity and lumbar spondylosis status post lumbar decompression surgery in January 2015. Just discharged 11/04/13 after treatment for c.diff colitis and salmonella enterocolitis. Hospitalization complicated by encephalopathy and acute renal failure (peak creatinine 3.03). Admitted 2/25 with respiratory failure thought due to combination PNA and HF.   Patient was extubated on 3/5. BSE on 3/6 with recommendations for Dysphagia 1 with Nectar Thick liquids. Pt has been on and off of a full liquid diet since 3/6. Pt underwent endoscopy 3/11. Findings consistent with severe reflux esophagitis. Pt with likely gastroparesis. CT scan of abdomen worrisome for rectal mass and colitis. Pt underwent NGT placement per RN on 3/13 for low suction to prevent continuous reflux and possible aspiration.  Colonoscopy on 3/15 with findings of polyps, mild diverticulosis, and cecal ulcerations.  Postpyloric feeding tube placement on 3/16, per notes - tip of feeding tube near ligament of Treitz in good position. Tube feeding initiated this morning. RD consulted to manage TF. Currently receiving Osmolite 1.2 at 65 ml/hr with 30 ml Prostat daily.  Now ordered for clear liquid diet. Per RN pt was tolerating well. TF now held for clear liquid diet trial.  Potassium low, currently 3.5. Magnesium and phosphorus WNL. CBG's WNL: 107, 109, 111 Meds include: IV reglan, IV zofran, IV protonix, KCl, carafate, florastor  Height: Ht Readings from Last 1 Encounters:  11/17/13 _0  (1.676 m)    Weight Status:   Wt Readings from Last 1 Encounters:  11/26/13 273 lb 3.2 oz (123.923 kg)  Admit wt 250 lb  Re-estimated needs:  Kcal: 1800 - 2100 Protein: 100 - 110 Fluid: 1.8 - 2 L/day  Skin:  Stage II to upper medial sacrum Stage II to mid medial sacrum Stage II to lower medial sacrum  Diet Order: Clear Liquid   Intake/Output Summary (Last 24 hours) at 11/27/13 1130 Last data filed at 11/27/13 0845  Gross per 24 hour  Intake      0 ml  Output    351 ml  Net   -351 ml    Last BM: 3/18 - diarrhea  Labs:   Recent Labs Lab 11/23/13 0430  11/25/13 0540 11/26/13 0510 11/27/13 0500  NA 141  < > 139 141 142  K 3.2*  < > 3.4* 3.3* 3.5*  CL 103  < > 101 103 106  CO2 29  < > _1 BUN 4*  < > 3* 6 8  CREATININE 0.91  < > 0.88 0.96 0.94  CALCIUM 7.7*  < > 7.7* 7.8* 7.6*  MG 1.7  < > 1.7 1.4* 1.5  PHOS 2.3  --   --   --  2.7  GLUCOSE 114*  < >  125* 100* 112*  < > = values in this interval not displayed.  CBG (last 3)   Recent Labs  11/27/13 0009 11/27/13 0439 11/27/13 0752  GLUCAP 111* 109* 107*    Scheduled Meds: . buPROPion  150 mg Oral Daily  . chlorpheniramine-HYDROcodone  5 mL Per Tube Q12H  . escitalopram  20 mg Oral Daily  . feeding supplement (PRO-STAT SUGAR FREE 64)  30 mL Per Tube Daily  . guaiFENesin  15 mL Per Tube 4 times per day  . lamoTRIgine  200 mg Oral Daily  . levothyroxine  88 mcg Oral QAC breakfast  . metoCLOPramide (REGLAN) injection  5 mg Intravenous 4 times per day  . metoprolol tartrate  12.5 mg Oral BID  . vancomycin  500 mg Oral 4 times per day   And  . metronidazole  500 mg  Intravenous 3 times per day  . ondansetron (ZOFRAN) IV  4 mg Intravenous 4 times per day  . pantoprazole (PROTONIX) IV  40 mg Intravenous Q12H  . QUEtiapine  100 mg Oral BID  . saccharomyces boulardii  250 mg Oral BID  . sucralfate  1 g Oral TID AC & HS    Continuous Infusions: . feeding supplement (OSMOLITE 1.2 CAL) 1,000 mL (11/27/13 0048)    Inda Coke MS, RD, LDN Inpatient Registered Dietitian Pager: [phone] After-hours pager: [phone]

## 2013-11-27 NOTE — Progress Notes (Signed)
[facility] PROGRESS NOTE  [name] DGU:[id number] DOB: 09/21/1950 DOA: 11/19/2013 PCP: [name], MD  Assessment/Plan:  Acute respiratory failure:  - Secondary to aspiration pneumonia.  - clinically improved  - Continue Dobhoff tube, d/c NGT on 3/19 - 3/17 patient S/P fluoroscopic placement of a Dobhoff tube   Upper GI bleed:  - Patient has significant pathology; esophagitis, duodenal ulcer, duodenitis. See EGD report below  - All medication to be administered via Dobhoff (carafate, PPI po bid, bid pepcid, florastor)   Aspiration Pneumonia:  - Patient made n.p.o. secondary to refractory N./V. and increased risk of aspirating.  - Patient had been without nutrition since admission on 3/1. Started on NGT feeds on 3/15 with consult nutrition and pharmacy for tube feedings.  - 3/16 unable to continue NG tube feeding secondary to large residuals.  - 3/17 S/P IR placement of Dobhoff tube with termination in the duodenum  - 3/17 Started enteral feedings via Dobhoff, using enteral feeds for non- ICU patient protocol.  - 3/19 no NGT residuals, D/c NGT, continue Dobbhoff feeds. - Will clamp NG tube and try clear liquid diet today.  Dysphagia:  - Patient has significant pathology; esophagitis, duodenal ulcer, duodenitis. See EGD report below  - Patient had been without nutrition since admission on 3/1, see above - 3/16 ASAP order placed for fluoroscopically guided placement of Dobhoff feeding to placement into the duodenum.  - 3/17 Dobhoff tube successfully placed and feedings started, continue feeds - Will clamp NG tube and try clear liquid diet.  Depression  -continue Lexapro, Wellbutrin, via tube   Hypothyroidism:  -Synthroid 88 mcg per tube   Hyperlipidemia:  - Stable.  - lipid panel within NCEP guidelines   Hypertension:  - Relatively well controlled on metoprolol 12.5 mg BID   C. difficile colitis:  - Continue Flagyl+ PO vancomycin. Will Consider 3/13 as  first full day of treatment (3/13 NG tube placed and patient able to keep all medication down)  - Recheck C Diff 3/19 - CT scan abdomen and pelvis completed; significant pathology to include possible neoplasm see results below.  - Dr. Delfin Edis (GI) conducted Colonoscopy on 3/15, which showed significant pathology see report below.  - GI recommends f/u colonoscopy outpatient (1 small tubular adenoma in colon).  Hx psychosis:  - Improving - Likely related to being off of Seroquel, Lamictal and Lexapro for almost one week. Continue medication via tube   Hypernatremia:  - Resolved  - Check CMP in the a.m.   Hypokalemia  -Will reassess next am with continued feeds. - replace orally  Hypomagnesemia:  - Currently 1.4 on 3/19 - Replace today - Monitor  Nausea vomiting  - NG tube placed 3/13, d/c on 3/19  - Dobbhoff placed 3/17 - Most likely secondary to gastroparesis and patient significant pathology i.e. sophagitis/duodenitis/duodenal ulcer.  - Continue scheduled Zofran  - Reglan 5 mg QID   Code Status: Full code  Family Communication: Spoke with daughter [name] and informed her of the plan of care today.  Disposition Plan: Resolution of refractory N/V; Physical/occupational therapy recommending skilled nursing.    Consultants:  Gastroenterology  Antibiotics: Metronidazole- day 7 Vancomycin- day 7  HPI/Subjective: Patient has no new complaints. No acute issues overnight. Pt reports nausea is decreasing.  Objective: Filed Vitals:   11/27/13 1235  BP: 119/64  Pulse: 76  Temp: 98.9 F (37.2 C)  Resp: 18    Intake/Output Summary (Last 24 hours) at 11/27/13 1655 Last data filed at 11/27/13  1506  Gross per 24 hour  Intake     60 ml  Output    352 ml  Net   -292 ml   Filed Weights   11/24/13 2044 11/25/13 2205 11/26/13 2015  Weight: 122.38 kg (269 lb 12.8 oz) 123.8 kg (272 lb 14.9 oz) 123.923 kg (273 lb 3.2 oz)    Exam:   General:  Pt in NAD, Alert and  awake  Cardiovascular: RRR, no MRG  Respiratory: CTA BL, no wheezes  Abdomen: soft, ND. + bowel sounds.   Musculoskeletal: no cyanosis or clubbing, 3+ edema to bilateral LE  Data Reviewed: Basic Metabolic Panel:  Recent Labs Lab 11/23/13 0430 11/24/13 0521 11/25/13 0540 11/26/13 0510 11/27/13 0500  NA 141 140 139 141 142  K 3.2* 3.0* 3.4* 3.3* 3.5*  CL 103 102 101 103 106  CO2 29 29 27 27 29   GLUCOSE 114* 115* 125* 100* 112*  BUN 4* 3* 3* 6 8  CREATININE 0.91 0.86 0.88 0.96 0.94  CALCIUM 7.7* 7.6* 7.7* 7.8* 7.6*  MG 1.7 2.0 1.7 1.4* 1.5  PHOS 2.3  --   --   --  2.7   Liver Function Tests:  Recent Labs Lab 11/23/13 0430 11/24/13 0521 11/25/13 0540 11/26/13 0510 11/27/13 0500  AST 12 12 12 11 10   ALT 7 7 7 6 6   ALKPHOS 52 54 64 72 86  BILITOT <0.2* 0.2* 0.2* 0.2* <0.2*  PROT 4.4* 4.6* 4.8* 4.8* 4.5*  ALBUMIN 1.7* 1.7* 1.8* 1.8* 1.6*   No results found for this basename: LIPASE, AMYLASE,  in the last 168 hours No results found for this basename: AMMONIA,  in the last 168 hours CBC:  Recent Labs Lab 11/23/13 0430 11/24/13 0521 11/25/13 0540 11/26/13 0510 11/27/13 0500  WBC 6.3 6.2 5.0 6.4 5.7  NEUTROABS 3.5 3.3 2.7 3.5 3.1  HGB 8.1* 8.1* 8.6* 8.3* 7.7*  HCT 23.9* 24.0* 25.5* 24.9* 23.1*  MCV 87.2 87.6 89.5 88.9 90.2  PLT 380 339 318 278 217   Cardiac Enzymes: No results found for this basename: CKTOTAL, CKMB, CKMBINDEX, TROPONINI,  in the last 168 hours BNP (last 3 results)  Recent Labs  12/06/2013 1512  PROBNP 1572.0*   CBG:  Recent Labs Lab 11/27/13 0009 11/27/13 0439 11/27/13 0752 11/27/13 1210 11/27/13 1642  GLUCAP 111* 109* 107* 100* 90    No results found for this or any previous visit (from the past 240 hour(s)).   Studies: No results found.  Scheduled Meds: . buPROPion  150 mg Oral Daily  . chlorpheniramine-HYDROcodone  5 mL Per Tube Q12H  . escitalopram  20 mg Oral Daily  . feeding supplement (PRO-STAT SUGAR FREE 64)   30 mL Per Tube Daily  . guaiFENesin  15 mL Per Tube 4 times per day  . lamoTRIgine  200 mg Oral Daily  . levothyroxine  88 mcg Oral QAC breakfast  . metoCLOPramide (REGLAN) injection  5 mg Intravenous 4 times per day  . metoprolol tartrate  12.5 mg Oral BID  . vancomycin  500 mg Oral 4 times per day   And  . metronidazole  500 mg Intravenous 3 times per day  . ondansetron (ZOFRAN) IV  4 mg Intravenous 4 times per day  . pantoprazole (PROTONIX) IV  40 mg Intravenous Q12H  . QUEtiapine  100 mg Oral BID  . saccharomyces boulardii  250 mg Oral BID  . sucralfate  1 g Oral TID AC & HS   Continuous Infusions: .  feeding supplement (OSMOLITE 1.2 CAL) 1,000 mL (11/27/13 0048)    Principal Problem:   Aspiration pneumonia Active Problems:   Severe Clostridium difficile enterocolitis   Acute respiratory failure   Hematemesis   Dysphagia, pharyngoesophageal phase   H/O psychosis   Pulmonary hypertension   Duodenal ulcer, acute   Reflux esophagitis   Other and unspecified noninfectious gastroenteritis and colitis(558.9)   Ulceration of intestine   Benign neoplasm of colon    Time spent: > 35 minutes    Velvet Bathe  [facility] Pager [phone] If 7PM-7AM, please contact night-coverage at [email or web address], password [facility] 11/27/2013, 4:55 PM  LOS: 19 days

## 2013-11-27 NOTE — Progress Notes (Signed)
Occupational Therapy Treatment Patient Details Name: [name] MRN: [id number] DOB: [birth year] Today's Date: 11/27/2013 Time: 1040-1051 OT Time Calculation (min): 11 min  OT Assessment / Plan / Recommendation  History of present illness 63 year old obese female with history of HTN, GERD, depression, obesity and lumbar spondylosis status post lumbar PLIF surgery in January 2015.  Just discharged 11/04/13 after treatment for c.diff colitis and salmonella enterocolitis. Hospitalization complicated by encephalopathy and acute renal failure (peak creatinine 3.03). Admitted 2/25 with respiratory failure though due to combination PNA and HF. Pt DC to [facility] after PLIF was home 1wk with falls and difficulty when she was readmitted last time then return to [facility] at that DC   OT comments  Pt limited by fatigue today. Continue to recommend SNF.  Follow Up Recommendations  SNF;Supervision/Assistance - 24 hour    Barriers to Discharge       Equipment Recommendations  None recommended by OT    Recommendations for Other Services    Frequency Min 2X/week   Progress towards OT Goals Progress towards OT goals: Progressing toward goals  Plan Discharge plan remains appropriate    Precautions / Restrictions Precautions Precautions: Fall;Back Precaution Comments: pt able to verbalize 3/3 back precautions, max multimodal cues required to maintain during bed mobility. Restrictions Weight Bearing Restrictions: No   Pertinent Vitals/Pain See vitals    ADL  Grooming: Performed;Wash/dry face;Wash/dry hands;Set up;Supervision/safety;Teeth care Where Assessed - Grooming: Unsupported sitting Upper Body Bathing: Performed;Supervision/safety;Set up Where Assessed - Upper Body Bathing: Unsupported sitting Transfers/Ambulation Related to ADLs: declined transfer due to fatigue ADL Comments: Pt performed grooming tasks sitting EOB then requesting to lie back down.    OT Diagnosis:    OT Problem  List:   OT Treatment Interventions:     OT Goals(current goals can now be found in the care plan section) Acute Rehab OT Goals Patient Stated Goal: be able to walk and return home OT Goal Formulation: With patient Time For Goal Achievement: 12/04/13 Potential to Achieve Goals: Good ADL Goals Pt Will Perform Grooming: with min assist;with min guard assist;standing Pt Will Perform Lower Body Bathing: with min assist;sitting/lateral leans;sit to/from stand Pt Will Transfer to Toilet: with min guard assist;bedside commode;ambulating;regular height toilet;grab bars Pt Will Perform Toileting - Clothing Manipulation and hygiene: with max assist;with mod assist;sitting/lateral leans;sit to/from stand Additional ADL Goal #1: Pt will complete bed mobility with min guard A - sup to sit EOB in prep for ADLs Additional ADL Goal #2: Pt will verbalize/demo all back precautions for ADLs  Visit Information  Last OT Received On: 11/27/13 Assistance Needed: +1 History of Present Illness: 63 year old obese female with history of HTN, GERD, depression, obesity and lumbar spondylosis status post lumbar PLIF surgery in January 2015.  Just discharged 11/04/13 after treatment for c.diff colitis and salmonella enterocolitis. Hospitalization complicated by encephalopathy and acute renal failure (peak creatinine 3.03). Admitted 2/25 with respiratory failure though due to combination PNA and HF. Pt DC to [facility] after PLIF was home 1wk with falls and difficulty when she was readmitted last time then return to [facility] at that DC    Subjective Data      Prior Functioning       Cognition  Cognition Arousal/Alertness: Awake/alert Behavior During Therapy: WFL for tasks assessed/performed Overall Cognitive Status: Within Functional Limits for tasks assessed    Mobility  Bed Mobility Overal bed mobility: Needs Assistance Bed Mobility: Rolling;Sidelying to Sit;Sit to Sidelying Rolling: Min assist Sidelying  to sit: Mod assist Sit to  sidelying: Min assist General bed mobility comments: VCs for hand placement/technique    Exercises      Balance    End of Session OT - End of Session Activity Tolerance: Patient limited by fatigue Patient left: in bed;with call bell/phone within reach  GO    11/27/2013 Darrol Jump OTR/L Pager [phone] Office [phone]  Darrol Jump 11/27/2013, 12:58 PM

## 2013-11-27 NOTE — Clinical Social Work Note (Signed)
CSW continuing to monitor patient's progress and will assist with discharge back to [facility] along with facilitating transport.  Amire Gossen Givens, MSW, LCSW [phone]
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
## 2013-11-28 LAB — COMPREHENSIVE METABOLIC PANEL
ALBUMIN: 1.6 g/dL — AB (ref 3.5–5.2)
ALK PHOS: 60 U/L (ref 39–117)
ALT: 5 U/L (ref 0–35)
AST: 14 U/L (ref 0–37)
BUN: 8 mg/dL (ref 6–23)
CHLORIDE: 105 meq/L (ref 96–112)
CO2: 25 mEq/L (ref 19–32)
Calcium: 7.8 mg/dL — ABNORMAL LOW (ref 8.4–10.5)
Creatinine, Ser: 0.93 mg/dL (ref 0.50–1.10)
GFR calc Af Amer: 75 mL/min — ABNORMAL LOW (ref >90)
GFR calc non Af Amer: 65 mL/min — ABNORMAL LOW (ref >90)
Glucose, Bld: 83 mg/dL (ref 70–99)
POTASSIUM: 3.5 meq/L — AB (ref 3.7–5.3)
Sodium: 141 mEq/L (ref 137–147)
Total Bilirubin: 0.2 mg/dL — ABNORMAL LOW (ref 0.3–1.2)
Total Protein: 4.7 g/dL — ABNORMAL LOW (ref 6.0–8.3)

## 2013-11-28 LAB — FERRITIN: Ferritin: 394 ng/mL — ABNORMAL HIGH (ref 10–291)

## 2013-11-28 LAB — CBC WITH DIFFERENTIAL/PLATELET
BASOS PCT: 1 % (ref 0–1)
Basophils Absolute: 0.1 10*3/uL (ref 0.0–0.1)
Eosinophils Absolute: 0.2 10*3/uL (ref 0.0–0.7)
Eosinophils Relative: 2 % (ref 0–5)
HEMATOCRIT: 24.2 % — AB (ref 36.0–46.0)
Hemoglobin: 8.1 g/dL — ABNORMAL LOW (ref 12.0–15.0)
LYMPHS PCT: 33 % (ref 12–46)
Lymphs Abs: 2.4 10*3/uL (ref 0.7–4.0)
MCH: 30.3 pg (ref 26.0–34.0)
MCHC: 33.5 g/dL (ref 30.0–36.0)
MCV: 90.6 fL (ref 78.0–100.0)
MONO ABS: 1 10*3/uL (ref 0.1–1.0)
Monocytes Relative: 14 % — ABNORMAL HIGH (ref 3–12)
Neutro Abs: 3.6 10*3/uL (ref 1.7–7.7)
Neutrophils Relative %: 50 % (ref 43–77)
Platelets: 232 10*3/uL (ref 150–400)
RBC: 2.67 MIL/uL — ABNORMAL LOW (ref 3.87–5.11)
RDW: 20.2 % — AB (ref 11.5–15.5)
WBC: 7.3 10*3/uL (ref 4.0–10.5)

## 2013-11-28 LAB — FOLATE: FOLATE: 5 ng/mL

## 2013-11-28 LAB — IRON AND TIBC
Iron: 29 ug/dL — ABNORMAL LOW (ref 42–135)
Saturation Ratios: 39 % (ref 20–55)
TIBC: 74 ug/dL — ABNORMAL LOW (ref 250–470)
UIBC: 45 ug/dL — ABNORMAL LOW (ref 125–400)

## 2013-11-28 LAB — GLUCOSE, CAPILLARY
GLUCOSE-CAPILLARY: 84 mg/dL (ref 70–99)
GLUCOSE-CAPILLARY: 88 mg/dL (ref 70–99)
GLUCOSE-CAPILLARY: 91 mg/dL (ref 70–99)
GLUCOSE-CAPILLARY: 98 mg/dL (ref 70–99)
Glucose-Capillary: 91 mg/dL (ref 70–99)
Glucose-Capillary: 114 mg/dL — ABNORMAL HIGH (ref 70–99)

## 2013-11-28 LAB — HEPARIN LEVEL (UNFRACTIONATED): Heparin Unfractionated: 0.97 IU/mL — ABNORMAL HIGH (ref 0.30–0.70)

## 2013-11-28 LAB — VITAMIN B12: VITAMIN B 12: 1022 pg/mL — AB (ref 211–911)

## 2013-11-28 LAB — PROTIME-INR
INR: 1.41 (ref 0.00–1.49)
PROTHROMBIN TIME: 16.9 s — AB (ref 11.6–15.2)

## 2013-11-28 LAB — MAGNESIUM: Magnesium: 1.3 mg/dL — ABNORMAL LOW (ref 1.5–2.5)

## 2013-11-28 MED ORDER — SODIUM CHLORIDE 0.9 % IV BOLUS (SEPSIS)
250.0000 mL | Freq: Once | INTRAVENOUS | Status: AC
  Administered 2013-11-28: 250 mL via INTRAVENOUS

## 2013-11-28 NOTE — Progress Notes (Signed)
[facility] PROGRESS NOTE  TWANA WILEMAN JSE:[id number] DOB: January 08, 1951 DOA: 11/20/2013 PCP: [name], MD  Assessment/Plan:  Acute respiratory failure:  - Secondary to aspiration pneumonia.  - Resolved  Upper GI bleed:  - Patient has significant pathology; esophagitis, duodenal ulcer, duodenitis. See EGD report below  - All medication to be administered via Dobhoff (carafate, PPI po bid, bid pepcid, florastor)   Aspiration Pneumonia:  - Patient made n.p.o. secondary to refractory N./V. and increased risk of aspirating.  - Patient had been without nutrition since admission on 3/1. Started on NGT feeds on 3/15 with consult nutrition and pharmacy for tube feedings.  - 3/16 unable to continue NG tube feeding secondary to large residuals.  - 3/17 S/P IR placement of Dobhoff tube with termination in the duodenum  - 3/17 Started enteral feedings via Dobhoff, using enteral feeds for non- ICU patient protocol.  - 3/19 no NGT residuals, D/c NGT, continue Dobbhoff feeds.   Dysphagia:  - Patient has significant pathology; esophagitis, duodenal ulcer, duodenitis. See EGD report below  - Patient had been without nutrition since admission on 3/1, see above - 3/16 ASAP order placed for fluoroscopically guided placement of Dobhoff feeding to placement into the duodenum.  - 3/17 Dobhoff tube successfully placed and feedings started, continue feeds - Patient did not tolerate clears. Reported feeling reflux.  Will clamp and start NG tube feeds again next am 11/28/13. Plan will be to try clamping next am and try advancing diet.  Will indicate to nursing to administer 30 minutes prior to ingestion of any food.  Depression  -continue Lexapro, Wellbutrin, via tube   Hypothyroidism:  -Synthroid 88 mcg per tube   Hyperlipidemia:  - Stable.  - lipid panel within NCEP guidelines   Hypotension - Holding parameters place on B blocker - Will administer normal saline fluid bolus - most  likely 2ary to poor oral intake.  Will restart tube feeds today.  C. difficile colitis:  - Continue Flagyl+ PO vancomycin. Will Consider 3/13 as first full day of treatment (3/13 NG tube placed and patient able to keep all medication down)  - Recheck C Diff 3/19 - CT scan abdomen and pelvis completed; significant pathology to include possible neoplasm see results. - Dr. Delfin Edis (GI) conducted Colonoscopy on 3/15, which showed significant pathology see report. - GI recommends f/u colonoscopy outpatient (1 small tubular adenoma in colon).  Hx psychosis:  - Improving - Likely related to being off of Seroquel, Lamictal and Lexapro for almost one week. Continue medication via tube   Hypernatremia:  - Resolved  - Check CMP in the a.m.   Hypokalemia  -Will reassess next am with continued feeds. - replace orally  Hypomagnesemia:  - Currently 1.4 on 3/19 - Replace today - Monitor  Nausea vomiting  - NG tube placed 3/13, d/c on 3/19  - Dobbhoff placed 3/17 - Most likely secondary to gastroparesis and patient significant pathology i.e. sophagitis/duodenitis/duodenal ulcer.  - Continue scheduled Zofran  - Reglan 5 mg QID   Code Status: Full code  Family Communication: no family at bedside. Disposition Plan: Resolution of refractory N/V; Physical/occupational therapy recommending skilled nursing.    Consultants:  Gastroenterology  Antibiotics: Metronidazole- day 9 Vancomycin- day 9  HPI/Subjective: Patient has no new complaints. No acute issues overnight. Patient reports that she was not able to tolerate clear liquid diet well. She states she had significant reflux. She denies ambulating much lately  Objective: Filed Vitals:   11/28/13 1300  BP:  86/43  Pulse: 85  Temp: 98 F (36.7 C)  Resp: 18    Intake/Output Summary (Last 24 hours) at 11/28/13 1601 Last data filed at 11/28/13 1300  Gross per 24 hour  Intake      0 ml  Output   3526 ml  Net  -3526 ml   Filed  Weights   11/25/13 2205 11/26/13 2015 11/27/13 2008  Weight: 123.8 kg (272 lb 14.9 oz) 123.923 kg (273 lb 3.2 oz) 124.467 kg (274 lb 6.4 oz)    Exam:   General:  Pt in NAD, Alert and awake  Cardiovascular: RRR, no MRG  Respiratory: CTA BL, no wheezes  Abdomen: soft, ND. + bowel sounds.   Musculoskeletal: no cyanosis or clubbing, 3+ edema to bilateral LE  Data Reviewed: Basic Metabolic Panel:  Recent Labs Lab 11/23/13 0430 11/24/13 0521 11/25/13 0540 11/26/13 0510 11/27/13 0500 11/28/13 0620  NA 141 140 139 141 142 141  K 3.2* 3.0* 3.4* 3.3* 3.5* 3.5*  CL 103 102 101 103 106 105  CO2 29 29 27 27 29 25   GLUCOSE 114* 115* 125* 100* 112* 83  BUN 4* 3* 3* 6 8 8   CREATININE 0.91 0.86 0.88 0.96 0.94 0.93  CALCIUM 7.7* 7.6* 7.7* 7.8* 7.6* 7.8*  MG 1.7 2.0 1.7 1.4* 1.5 1.3*  PHOS 2.3  --   --   --  2.7  --    Liver Function Tests:  Recent Labs Lab 11/24/13 0521 11/25/13 0540 11/26/13 0510 11/27/13 0500 11/28/13 0620  AST 12 12 11 10 14   ALT 7 7 6 6 5   ALKPHOS 54 64 72 86 60  BILITOT 0.2* 0.2* 0.2* <0.2* 0.2*  PROT 4.6* 4.8* 4.8* 4.5* 4.7*  ALBUMIN 1.7* 1.8* 1.8* 1.6* 1.6*   No results found for this basename: LIPASE, AMYLASE,  in the last 168 hours No results found for this basename: AMMONIA,  in the last 168 hours CBC:  Recent Labs Lab 11/24/13 0521 11/25/13 0540 11/26/13 0510 11/27/13 0500 11/28/13 0620  WBC 6.2 5.0 6.4 5.7 7.3  NEUTROABS 3.3 2.7 3.5 3.1 3.6  HGB 8.1* 8.6* 8.3* 7.7* 8.1*  HCT 24.0* 25.5* 24.9* 23.1* 24.2*  MCV 87.6 89.5 88.9 90.2 90.6  PLT 339 318 278 217 232   Cardiac Enzymes: No results found for this basename: CKTOTAL, CKMB, CKMBINDEX, TROPONINI,  in the last 168 hours BNP (last 3 results)  Recent Labs  12/08/2013 1512  PROBNP 1572.0*   CBG:  Recent Labs Lab 11/27/13 2011 11/28/13 0004 11/28/13 0423 11/28/13 0758 11/28/13 1159  GLUCAP 75 98 91 91 114*    Recent Results (from the past 240 hour(s))  CLOSTRIDIUM  DIFFICILE BY PCR     Status: None   Collection Time    11/27/13 10:46 AM      Result Value Ref Range Status   C difficile by pcr NEGATIVE  NEGATIVE Final     Studies: No results found.  Scheduled Meds: . buPROPion  150 mg Oral Daily  . chlorpheniramine-HYDROcodone  5 mL Per Tube Q12H  . escitalopram  20 mg Oral Daily  . feeding supplement (PRO-STAT SUGAR FREE 64)  30 mL Per Tube Daily  . guaiFENesin  15 mL Per Tube 4 times per day  . lamoTRIgine  200 mg Oral Daily  . levothyroxine  88 mcg Oral QAC breakfast  . metoCLOPramide (REGLAN) injection  5 mg Intravenous 4 times per day  . metoprolol tartrate  12.5 mg Oral BID  .  vancomycin  500 mg Oral 4 times per day   And  . metronidazole  500 mg Intravenous 3 times per day  . ondansetron (ZOFRAN) IV  4 mg Intravenous 4 times per day  . pantoprazole (PROTONIX) IV  40 mg Intravenous Q12H  . QUEtiapine  100 mg Oral BID  . saccharomyces boulardii  250 mg Oral BID  . sodium chloride  250 mL Intravenous Once  . sucralfate  1 g Oral TID AC & HS   Continuous Infusions: . feeding supplement (OSMOLITE 1.2 CAL) 1,000 mL (11/28/13 0957)  . heparin 1,250 Units/hr (11/28/13 1023)    Principal Problem:   Aspiration pneumonia Active Problems:   Severe Clostridium difficile enterocolitis   Acute respiratory failure   Hematemesis   Dysphagia, pharyngoesophageal phase   H/O psychosis   Pulmonary hypertension   Duodenal ulcer, acute   Reflux esophagitis   Other and unspecified noninfectious gastroenteritis and colitis(558.9)   Ulceration of intestine   Benign neoplasm of colon    Time spent: > 35 minutes    Velvet Bathe  [facility] Pager [phone] If 7PM-7AM, please contact night-coverage at [email or web address], password [facility] 11/28/2013, 4:01 PM  LOS: 20 days

## 2013-11-28 NOTE — Progress Notes (Signed)
ANTICOAGULATION CONSULT NOTE - Follow Up Consult  Pharmacy Consult for Heparin  Indication: DVT  Allergies  Allergen Reactions  . Aspirin Rash    Patient Measurements: Height: 5\' 6"  (167.6 cm) Weight: 274 lb 6.4 oz (124.467 kg) IBW/kg (Calculated) : 59.3 Heparin Dosing Weight: 89 kg  Vital Signs: Temp: 98.3 F (36.8 C) (03/21 0421) Temp src: Oral (03/21 0421) BP: 120/66 mmHg (03/21 0421) Pulse Rate: 93 (03/21 0421)  Labs:  Recent Labs  11/26/13 0510 11/27/13 0500 11/28/13 0620  HGB 8.3* 7.7* 8.1*  HCT 24.9* 23.1* 24.2*  PLT 278 217 232  LABPROT  --   --  16.9*  INR  --   --  1.41  HEPARINUNFRC  --   --  0.97*  CREATININE 0.96 0.94  --     Estimated Creatinine Clearance: 83.7 ml/min (by C-G formula based on Cr of 0.94).   Medications:  Heparin 1450 units/hr  Assessment: 63 y/o F on heparin for DVT. HL is 0.97. Other labs as above. No issues per RN.   Goal of Therapy:  Heparin level 0.3-0.7 units/ml Monitor platelets by anticoagulation protocol: Yes   Plan:  -Decrease heparin to 1250 units/hr -1500 HL -Daily CBC/HL -Monitor for bleeding  Narda Bonds 11/28/2013,7:40 AM

## 2013-11-29 LAB — CBC WITH DIFFERENTIAL/PLATELET
Basophils Absolute: 0 10*3/uL (ref 0.0–0.1)
Basophils Relative: 1 % (ref 0–1)
Eosinophils Absolute: 0.1 10*3/uL (ref 0.0–0.7)
Eosinophils Relative: 2 % (ref 0–5)
HCT: 23.7 % — ABNORMAL LOW (ref 36.0–46.0)
HEMOGLOBIN: 7.9 g/dL — AB (ref 12.0–15.0)
LYMPHS ABS: 1.8 10*3/uL (ref 0.7–4.0)
LYMPHS PCT: 28 % (ref 12–46)
MCH: 29.7 pg (ref 26.0–34.0)
MCHC: 33.3 g/dL (ref 30.0–36.0)
MCV: 89.1 fL (ref 78.0–100.0)
MONOS PCT: 13 % — AB (ref 3–12)
Monocytes Absolute: 0.8 10*3/uL (ref 0.1–1.0)
NEUTROS ABS: 3.6 10*3/uL (ref 1.7–7.7)
Neutrophils Relative %: 56 % (ref 43–77)
Platelets: 251 10*3/uL (ref 150–400)
RBC: 2.66 MIL/uL — AB (ref 3.87–5.11)
RDW: 20.2 % — ABNORMAL HIGH (ref 11.5–15.5)
WBC: 6.4 10*3/uL (ref 4.0–10.5)

## 2013-11-29 LAB — GLUCOSE, CAPILLARY
GLUCOSE-CAPILLARY: 93 mg/dL (ref 70–99)
Glucose-Capillary: 78 mg/dL (ref 70–99)
Glucose-Capillary: 106 mg/dL — ABNORMAL HIGH (ref 70–99)
Glucose-Capillary: 86 mg/dL (ref 70–99)
Glucose-Capillary: 91 mg/dL (ref 70–99)

## 2013-11-29 LAB — HEPARIN LEVEL (UNFRACTIONATED)
HEPARIN UNFRACTIONATED: 0.63 [IU]/mL (ref 0.30–0.70)
Heparin Unfractionated: 0.85 IU/mL — ABNORMAL HIGH (ref 0.30–0.70)
Heparin Unfractionated: 0.65 IU/mL (ref 0.30–0.70)

## 2013-11-29 LAB — COMPREHENSIVE METABOLIC PANEL
ALT: 6 U/L (ref 0–35)
AST: 14 U/L (ref 0–37)
Albumin: 1.6 g/dL — ABNORMAL LOW (ref 3.5–5.2)
Alkaline Phosphatase: 73 U/L (ref 39–117)
BUN: 10 mg/dL (ref 6–23)
CALCIUM: 7.8 mg/dL — AB (ref 8.4–10.5)
CO2: 27 meq/L (ref 19–32)
CREATININE: 0.91 mg/dL (ref 0.50–1.10)
Chloride: 104 mEq/L (ref 96–112)
GFR calc non Af Amer: 66 mL/min — ABNORMAL LOW (ref >90)
GFR, EST AFRICAN AMERICAN: 77 mL/min — AB (ref >90)
GLUCOSE: 118 mg/dL — AB (ref 70–99)
Potassium: 3.2 mEq/L — ABNORMAL LOW (ref 3.7–5.3)
Sodium: 141 mEq/L (ref 137–147)
Total Protein: 4.7 g/dL — ABNORMAL LOW (ref 6.0–8.3)

## 2013-11-29 LAB — MAGNESIUM: Magnesium: 1.3 mg/dL — ABNORMAL LOW (ref 1.5–2.5)

## 2013-11-29 MED ORDER — POTASSIUM CHLORIDE CRYS ER 20 MEQ PO TBCR: 40.0000 meq | EXTENDED_RELEASE_TABLET | Freq: Once | ORAL | Status: DC

## 2013-11-29 MED ORDER — POTASSIUM CHLORIDE 20 MEQ/15ML (10%) PO LIQD
40.0000 meq | Freq: Once | ORAL | Status: AC
  Administered 2013-11-29: 40 meq via ORAL
  Filled 2013-11-29 (×2): qty 30

## 2013-11-29 NOTE — Progress Notes (Signed)
[facility] PROGRESS NOTE  [name] PZW:[id number] DOB: 1951/03/22 DOA: 2013/11/12 PCP: [name], MD  Assessment/Plan:  Acute respiratory failure:  - Secondary to aspiration pneumonia.  - Resolved  Upper GI bleed:  - Patient has significant pathology; esophagitis, duodenal ulcer, duodenitis. See EGD report below  - All medication to be administered via Dobhoff (carafate, PPI po bid, bid pepcid, florastor)   Aspiration Pneumonia:  - Patient made n.p.o. secondary to refractory N./V. and increased risk of aspirating.  - Patient had been without nutrition since admission on 3/1. Started on NGT feeds on 3/15 with consult nutrition and pharmacy for tube feedings.  - 3/16 unable to continue NG tube feeding secondary to large residuals.  - 3/17 S/P IR placement of Dobhoff tube with termination in the duodenum  - 3/17 Started enteral feedings via Dobhoff, using enteral feeds for non- ICU patient protocol.  - 3/19 no NGT residuals, D/c NGT, continue Dobbhoff feeds.   Dysphagia:  - Patient has significant pathology; esophagitis, duodenal ulcer, duodenitis. See EGD report below  - Patient had been without nutrition since admission on 3/1, see above - 3/16 ASAP order placed for fluoroscopically guided placement of Dobhoff feeding to placement into the duodenum.  - 3/17 Dobhoff tube successfully placed and feedings started, continue feeds - 3/22 we'll clamp NG tube and try clears  Depression  -continue Lexapro, Wellbutrin, via tube   Hypothyroidism:  -Synthroid 88 mcg per tube   Hyperlipidemia:  - Stable.  - lipid panel within NCEP guidelines   Hypotension - Holding parameters place on B blocker - Will administer normal saline fluid bolus - most likely 2ary to poor oral intake.  Will restart tube feeds today.  C. difficile colitis:  - Continue Flagyl+ PO vancomycin. Will Consider 3/13 as first full day of treatment (3/13 NG tube placed and patient able to keep  all medication down)  - Recheck C Diff 3/19 - CT scan abdomen and pelvis completed; significant pathology to include possible neoplasm see results. - Dr. Delfin Edis (GI) conducted Colonoscopy on 3/15, which showed significant pathology see report. - GI recommends f/u colonoscopy outpatient (1 small tubular adenoma in colon).  Hx psychosis:  - Improving - Likely related to being off of Seroquel, Lamictal and Lexapro for almost one week. Continue medication via tube   Hypernatremia:  - Resolved  - Check CMP in the a.m.   Hypokalemia  -Will reassess next am with continued feeds. - replace orally  Hypomagnesemia:  - Currently 1.4 on 3/19 - Replace today - Monitor  Nausea vomiting  - NG tube placed 3/13, d/c on 3/19  - Dobbhoff placed 3/17 - Most likely secondary to gastroparesis and patient significant pathology i.e. sophagitis/duodenitis/duodenal ulcer.  - Continue scheduled Zofran  - Reglan 5 mg QID   Code Status: Full code  Family Communication: no family at bedside. Disposition Plan: Resolution of refractory N/V; Physical/occupational therapy recommending skilled nursing.    Consultants:  Gastroenterology  Antibiotics: Metronidazole- day 9 Vancomycin- day 9  HPI/Subjective: Patient has no new complaints. No acute issues overnight.   Objective: Filed Vitals:   11/29/13 1212  BP: 121/66  Pulse: 90  Temp: 98.8 F (37.1 C)  Resp: 22    Intake/Output Summary (Last 24 hours) at 11/29/13 1628 Last data filed at 11/29/13 1450  Gross per 24 hour  Intake 526.25 ml  Output   2900 ml  Net -2373.75 ml   Filed Weights   11/26/13 2015 11/27/13 2008 11/28/13 2010  Weight: 123.923 kg (273 lb 3.2 oz) 124.467 kg (274 lb 6.4 oz) 124.013 kg (273 lb 6.4 oz)    Exam:   General:  Pt in NAD, Alert and awake  Cardiovascular: RRR, no MRG  Respiratory: CTA BL, no wheezes  Abdomen: soft, ND. + bowel sounds.   Musculoskeletal: no cyanosis or clubbing, 3+ edema to  bilateral LE  Data Reviewed: Basic Metabolic Panel:  Recent Labs Lab 11/23/13 0430  11/25/13 0540 11/26/13 0510 11/27/13 0500 11/28/13 0620 11/29/13 0105  NA 141  < > 139 141 142 141 141  K 3.2*  < > 3.4* 3.3* 3.5* 3.5* 3.2*  CL 103  < > 101 103 106 105 104  CO2 29  < > 27 27 29 25 27   GLUCOSE 114*  < > 125* 100* 112* 83 118*  BUN 4*  < > 3* 6 8 8 10   CREATININE 0.91  < > 0.88 0.96 0.94 0.93 0.91  CALCIUM 7.7*  < > 7.7* 7.8* 7.6* 7.8* 7.8*  MG 1.7  < > 1.7 1.4* 1.5 1.3* 1.3*  PHOS 2.3  --   --   --  2.7  --   --   < > = values in this interval not displayed. Liver Function Tests:  Recent Labs Lab 11/25/13 0540 11/26/13 0510 11/27/13 0500 11/28/13 0620 11/29/13 0105  AST 12 11 10 14 14   ALT 7 6 6 5 6   ALKPHOS 64 72 86 60 73  BILITOT 0.2* 0.2* <0.2* 0.2* <0.2*  PROT 4.8* 4.8* 4.5* 4.7* 4.7*  ALBUMIN 1.8* 1.8* 1.6* 1.6* 1.6*   No results found for this basename: LIPASE, AMYLASE,  in the last 168 hours No results found for this basename: AMMONIA,  in the last 168 hours CBC:  Recent Labs Lab 11/25/13 0540 11/26/13 0510 11/27/13 0500 11/28/13 0620 11/29/13 0105  WBC 5.0 6.4 5.7 7.3 6.4  NEUTROABS 2.7 3.5 3.1 3.6 3.6  HGB 8.6* 8.3* 7.7* 8.1* 7.9*  HCT 25.5* 24.9* 23.1* 24.2* 23.7*  MCV 89.5 88.9 90.2 90.6 89.1  PLT 318 278 217 232 251   Cardiac Enzymes: No results found for this basename: CKTOTAL, CKMB, CKMBINDEX, TROPONINI,  in the last 168 hours BNP (last 3 results)  Recent Labs  November 10, 2013 1512  PROBNP 1572.0*   CBG:  Recent Labs Lab 11/28/13 2012 11/29/13 0412 11/29/13 0821 11/29/13 1236 11/29/13 1556  GLUCAP 84 78 106* 93 86    Recent Results (from the past 240 hour(s))  CLOSTRIDIUM DIFFICILE BY PCR     Status: None   Collection Time    11/27/13 10:46 AM      Result Value Ref Range Status   C difficile by pcr NEGATIVE  NEGATIVE Final     Studies: No results found.  Scheduled Meds: . buPROPion  150 mg Oral Daily  .  chlorpheniramine-HYDROcodone  5 mL Per Tube Q12H  . escitalopram  20 mg Oral Daily  . feeding supplement (PRO-STAT SUGAR FREE 64)  30 mL Per Tube Daily  . guaiFENesin  15 mL Per Tube 4 times per day  . lamoTRIgine  200 mg Oral Daily  . levothyroxine  88 mcg Oral QAC breakfast  . metoCLOPramide (REGLAN) injection  5 mg Intravenous 4 times per day  . metoprolol tartrate  12.5 mg Oral BID  . ondansetron (ZOFRAN) IV  4 mg Intravenous 4 times per day  . pantoprazole (PROTONIX) IV  40 mg Intravenous Q12H  . QUEtiapine  100 mg Oral BID  .  saccharomyces boulardii  250 mg Oral BID  . sucralfate  1 g Oral TID AC & HS   Continuous Infusions: . feeding supplement (OSMOLITE 1.2 CAL) Stopped (11/29/13 1115)  . heparin 1,050 Units/hr (11/29/13 1502)    Principal Problem:   Aspiration pneumonia Active Problems:   Severe Clostridium difficile enterocolitis   Acute respiratory failure   Hematemesis   Dysphagia, pharyngoesophageal phase   H/O psychosis   Pulmonary hypertension   Duodenal ulcer, acute   Reflux esophagitis   Other and unspecified noninfectious gastroenteritis and colitis(558.9)   Ulceration of intestine   Benign neoplasm of colon    Time spent: > 35 minutes    Velvet Bathe  [facility] Pager [phone] If 7PM-7AM, please contact night-coverage at [email or web address], password [facility] 11/29/2013, 4:28 PM  LOS: 21 days

## 2013-11-29 NOTE — Progress Notes (Signed)
ANTICOAGULATION CONSULT NOTE - Follow Up Consult  Pharmacy Consult for Heparin  Indication: DVT  Allergies  Allergen Reactions  . Aspirin Rash    Patient Measurements: Height: 5\' 6"  (167.6 cm) Weight: 273 lb 6.4 oz (124.013 kg) IBW/kg (Calculated) : 59.3 Heparin Dosing Weight: 89 kg  Vital Signs: Temp: 98.5 F (36.9 C) (03/21 2010) Temp src: Oral (03/21 2010) BP: 116/58 mmHg (03/21 2010) Pulse Rate: 86 (03/21 2010)  Labs:  Recent Labs  11/26/13 0510 11/27/13 0500 11/28/13 0620 11/29/13 0105  HGB 8.3* 7.7* 8.1* 7.9*  HCT 24.9* 23.1* 24.2* 23.7*  PLT 278 217 232 251  LABPROT  --   --  16.9*  --   INR  --   --  1.41  --   HEPARINUNFRC  --   --  0.97* 0.63  CREATININE 0.96 0.94 0.93  --     Estimated Creatinine Clearance: 84.4 ml/min (by C-G formula based on Cr of 0.93).   Medications:  Heparin 1250 units/hr  Assessment: 63 y/o F on heparin for DVT. HL is 0.68 after rate decrease. Other labs as above.  Goal of Therapy:  Heparin level 0.3-0.7 units/ml Monitor platelets by anticoagulation protocol: Yes   Plan:  -Continue heparin at 1250 units/hr -1000 HL to confirm -Daily CBC/HL -Monitor for bleeding  Narda Bonds 11/29/2013,1:59 AM

## 2013-11-29 NOTE — Progress Notes (Signed)
ANTICOAGULATION CONSULT NOTE - Follow Up Consult  Pharmacy Consult for Heparin  Indication: DVT  Allergies  Allergen Reactions  . Aspirin Rash    Patient Measurements: Height: 5\' 6"  (167.6 cm) Weight: 273 lb 6.4 oz (124.013 kg) IBW/kg (Calculated) : 59.3 Heparin Dosing Weight: 89 kg  Vital Signs: Temp: 98.8 F (37.1 C) (03/22 1212) Temp src: Oral (03/22 1212) BP: 121/66 mmHg (03/22 1212) Pulse Rate: 90 (03/22 1212)  Labs:  Recent Labs  11/27/13 0500 11/28/13 0620 11/29/13 0105 11/29/13 1348  HGB 7.7* 8.1* 7.9*  --   HCT 23.1* 24.2* 23.7*  --   PLT 217 232 251  --   LABPROT  --  16.9*  --   --   INR  --  1.41  --   --   HEPARINUNFRC  --  0.97* 0.63 0.85*  CREATININE 0.94 0.93 0.91  --     Estimated Creatinine Clearance: 86.2 ml/min (by C-G formula based on Cr of 0.91).   Medications:  Heparin 1250 units/hr  Assessment: 63 y/o F on heparin for DVT. HL is now supratherapeutic again.  Confirmed that level drawn out of opposite arm from heparin running. Other labs as above.  Goal of Therapy:  Heparin level 0.3-0.7 units/ml Monitor platelets by anticoagulation protocol: Yes   Plan:  -Decrease heparin to 1050 units/hr -2100 HL -Daily CBC/HL -Monitor for bleeding  Vivia Ewing, PharmD Clinical Pharmacist - Resident Pager: [phone] Pharmacy: [phone] 11/29/2013 2:47 PM

## 2013-11-29 NOTE — Progress Notes (Signed)
11/30/13  Pharmacy-  Heparin 2220  Heparin level = 0.65 on 1050 units/hr  A/P:  63yo female with DVT.  Heparin level fell appropriately with rate decrease this afternoon.  No bleeding problems noted per RN.  1-  Continue current rate 2-  F/U AM labs  Gracy Bruins, PharmD [facility]

## 2013-11-30 LAB — COMPREHENSIVE METABOLIC PANEL
ALT: 6 U/L (ref 0–35)
AST: 14 U/L (ref 0–37)
Albumin: 1.8 g/dL — ABNORMAL LOW (ref 3.5–5.2)
Alkaline Phosphatase: 61 U/L (ref 39–117)
BUN: 9 mg/dL (ref 6–23)
CO2: 26 mEq/L (ref 19–32)
Calcium: 7.9 mg/dL — ABNORMAL LOW (ref 8.4–10.5)
Chloride: 105 mEq/L (ref 96–112)
Creatinine, Ser: 0.95 mg/dL (ref 0.50–1.10)
GFR calc Af Amer: 73 mL/min — ABNORMAL LOW (ref >90)
GFR calc non Af Amer: 63 mL/min — ABNORMAL LOW (ref >90)
Glucose, Bld: 74 mg/dL (ref 70–99)
Potassium: 3.5 mEq/L — ABNORMAL LOW (ref 3.7–5.3)
SODIUM: 142 meq/L (ref 137–147)
TOTAL PROTEIN: 5 g/dL — AB (ref 6.0–8.3)
Total Bilirubin: 0.2 mg/dL — ABNORMAL LOW (ref 0.3–1.2)

## 2013-11-30 LAB — CBC WITH DIFFERENTIAL/PLATELET
Basophils Absolute: 0 10*3/uL (ref 0.0–0.1)
Basophils Relative: 1 % (ref 0–1)
EOS PCT: 2 % (ref 0–5)
Eosinophils Absolute: 0.1 10*3/uL (ref 0.0–0.7)
HEMATOCRIT: 24.4 % — AB (ref 36.0–46.0)
Hemoglobin: 8.3 g/dL — ABNORMAL LOW (ref 12.0–15.0)
LYMPHS PCT: 34 % (ref 12–46)
Lymphs Abs: 2.2 10*3/uL (ref 0.7–4.0)
MCH: 30.2 pg (ref 26.0–34.0)
MCHC: 34 g/dL (ref 30.0–36.0)
MCV: 88.7 fL (ref 78.0–100.0)
MONO ABS: 1 10*3/uL (ref 0.1–1.0)
Monocytes Relative: 15 % — ABNORMAL HIGH (ref 3–12)
Neutro Abs: 3.2 10*3/uL (ref 1.7–7.7)
Neutrophils Relative %: 49 % (ref 43–77)
Platelets: 273 10*3/uL (ref 150–400)
RBC: 2.75 MIL/uL — ABNORMAL LOW (ref 3.87–5.11)
RDW: 20.8 % — AB (ref 11.5–15.5)
WBC: 6.5 10*3/uL (ref 4.0–10.5)

## 2013-11-30 LAB — GLUCOSE, CAPILLARY
Glucose-Capillary: 72 mg/dL (ref 70–99)
Glucose-Capillary: 74 mg/dL (ref 70–99)
Glucose-Capillary: 78 mg/dL (ref 70–99)
Glucose-Capillary: 78 mg/dL (ref 70–99)
Glucose-Capillary: 82 mg/dL (ref 70–99)
Glucose-Capillary: 84 mg/dL (ref 70–99)

## 2013-11-30 LAB — HEPARIN LEVEL (UNFRACTIONATED): Heparin Unfractionated: 0.64 IU/mL (ref 0.30–0.70)

## 2013-11-30 LAB — MAGNESIUM: Magnesium: 1.2 mg/dL — ABNORMAL LOW (ref 1.5–2.5)

## 2013-11-30 MED ORDER — HYDROCODONE-ACETAMINOPHEN 5-325 MG PO TABS
1.0000 | ORAL_TABLET | ORAL | Status: DC | PRN
  Administered 2013-12-06 – 2013-12-11 (×8): 1 via ORAL
  Filled 2013-11-30 (×8): qty 1

## 2013-11-30 MED ORDER — HYDROCODONE-ACETAMINOPHEN 5-325 MG PO TABS
2.0000 | ORAL_TABLET | Freq: Once | ORAL | Status: DC
  Filled 2013-11-30 (×2): qty 2

## 2013-11-30 NOTE — Progress Notes (Signed)
Physical Therapy Treatment Patient Details Name: [name] MRN: [id number] DOB: [birth year] Today's Date: 11/30/2013 Time: 1027-2536 PT Time Calculation (min): 22 min  PT Assessment / Plan / Recommendation  History of Present Illness 63 year old obese female with history of HTN, GERD, depression, obesity and lumbar spondylosis status post lumbar PLIF surgery in January 2015.  Just discharged 11/04/13 after treatment for c.diff colitis and salmonella enterocolitis. Hospitalization complicated by encephalopathy and acute renal failure (peak creatinine 3.03). Admitted 2/25 with respiratory failure though due to combination PNA and HF. Pt DC to [facility] after PLIF was home 1wk with falls and difficulty when she was readmitted last time then return to [facility] at that DC   PT Comments   Progressing slowly with multiple medical issues, incontinence and poor nutrition all contributing.  Continue to feel she needs SNF rehab at d/c.  Continue acute level PT as tolerated.  Follow Up Recommendations  SNF           Equipment Recommendations  None recommended by PT    Recommendations for Other Services  None  Frequency Min 2X/week   Progress towards PT Goals Progress towards PT goals: Progressing toward goals  Plan Current plan remains appropriate    Precautions / Restrictions Precautions Precautions: Fall;Back Precaution Comments: bent to reach to feet to don socks in long sitting; needed reminder not to bend forward   Pertinent Vitals/Pain Min c/o pain on bottom    Mobility  Bed Mobility Overal bed mobility: Needs Assistance Rolling: Min assist Sidelying to sit: Min guard General bed mobility comments: assist to bend knee and cues for rolling with assist from railing; side to sit used bedrail to sit up Transfers Overall transfer level: Needs assistance Equipment used: Rolling walker (2 wheeled) Transfers: Sit to/from Stand Sit to Stand: From elevated surface;Mod  assist General transfer comment: needed three attempts to come upright; reports right leg not cooperating; raised height of bed to assist and allowed pt to pull up on walker Ambulation/Gait Ambulation/Gait assistance: Min assist Ambulation Distance (Feet): 15 Feet Assistive device: Rolling walker (2 wheeled) Gait Pattern/deviations: Step-to pattern;Shuffle;Decreased stride length;Trunk flexed;Wide base of support General Gait Details: assist to and from door; limited by weakness; cues and assist to maneuver walker         PT Goals (current goals can now be found in the care plan section)    Visit Information  Last PT Received On: 11/30/13 Assistance Needed: +1 History of Present Illness: 63 year old obese female with history of HTN, GERD, depression, obesity and lumbar spondylosis status post lumbar PLIF surgery in January 2015.  Just discharged 11/04/13 after treatment for c.diff colitis and salmonella enterocolitis. Hospitalization complicated by encephalopathy and acute renal failure (peak creatinine 3.03). Admitted 2/25 with respiratory failure though due to combination PNA and HF. Pt DC to [facility] after PLIF was home 1wk with falls and difficulty when she was readmitted last time then return to [facility] at that DC    Subjective Data   Hard to sit so long.   Cognition  Cognition Arousal/Alertness: Awake/alert Behavior During Therapy: WFL for tasks assessed/performed Overall Cognitive Status: Within Functional Limits for tasks assessed    Balance  Balance Standing balance comment: stood about 2-3 minutes while assisted for hygiene; able to stand briefly with walker and supervision  End of Session PT - End of Session Equipment Utilized During Treatment: Gait belt Activity Tolerance: Patient limited by fatigue Patient left: in chair;with call bell/phone within reach   GP  [name],[name] 11/30/2013, 3:11 PM Magda Kiel, [place] 11/30/2013

## 2013-11-30 NOTE — Progress Notes (Signed)
ANTICOAGULATION CONSULT NOTE - Follow Up Consult  Pharmacy Consult for heparin Indication: DVT  Allergies  Allergen Reactions  . Aspirin Rash    Patient Measurements: Height: 5\' 6"  (167.6 cm) Weight: 271 lb 13.2 oz (123.3 kg) IBW/kg (Calculated) : 59.3 Heparin Dosing Weight: 89 kg  Vital Signs: Temp: 98.6 F (37 C) (03/23 0512) Temp src: Oral (03/23 0512) BP: 115/63 mmHg (03/23 0512) Pulse Rate: 104 (03/23 0512)  Labs:  Recent Labs  11/28/13 0620 11/29/13 0105 11/29/13 1348 11/29/13 2105 11/30/13 0553  HGB 8.1* 7.9*  --   --  8.3*  HCT 24.2* 23.7*  --   --  24.4*  PLT 232 251  --   --  273  LABPROT 16.9*  --   --   --   --   INR 1.41  --   --   --   --   HEPARINUNFRC 0.97* 0.63 0.85* 0.65 0.64  CREATININE 0.93 0.91  --   --   --     Estimated Creatinine Clearance: 85.9 ml/min (by C-G formula based on Cr of 0.91).  Assessment: Patient is a 63 y.o F currently on heparin for new DVT.  No bleeding documented.  Heparin level is at goal with 0.64 this morning.    Goal of Therapy:  Heparin level 0.3-0.7 units/ml Monitor platelets by anticoagulation protocol: Yes   Plan:  1) continue heparin drip at 1050 units/hr 2) please indicate plan for oral anticoagulation  Abbas Beyene P 11/30/2013,8:40 AM

## 2013-11-30 NOTE — Progress Notes (Signed)
[facility] PROGRESS NOTE  [name] FGH:[id number] DOB: 1950/11/19 DOA: 12/05/2013 PCP: [name], MD  Assessment/Plan:  Acute respiratory failure:  - Secondary to aspiration pneumonia.  - Resolved  Upper GI bleed:  - Patient has significant pathology; esophagitis, duodenal ulcer, duodenitis. See EGD report below  - All medication to be administered via Dobhoff (carafate, PPI po bid, bid pepcid, florastor)   Aspiration Pneumonia:  - Patient was made n.p.o. secondary to refractory N./V. and increased risk of aspirating.  - Patient had been without nutrition since admission on 3/1. Started on NGT feeds on 3/15 with consult nutrition and pharmacy for tube feedings.  - 3/16 unable to continue NG tube feeding secondary to large residuals.  - 3/17 S/P IR placement of Dobhoff tube with termination in the duodenum  - 3/17 Started enteral feedings via Dobhoff, using enteral feeds for non- ICU patient protocol.  - 3/19 no NGT residuals, D/c NGT, continue Dobbhoff feeds. - At this point NG tube clamped  Dysphagia:  - Patient has significant pathology; esophagitis, duodenal ulcer, duodenitis. See EGD report below  - Patient had been without nutrition since admission on 3/1, see above - 3/16 ASAP order placed for fluoroscopically guided placement of Dobhoff feeding to placement into the duodenum.  - 3/17 Dobhoff tube successfully placed and feedings started but discontinued so that patient can be advanced to clears - 3/22 Clamped NG tube and currently in the process of advancing patient's diet.  Depression  -continue Lexapro, Wellbutrin, via tube   Hypothyroidism:  -Synthroid 88 mcg per tube   Hyperlipidemia:  - Stable.  - lipid panel within NCEP guidelines   Hypotension - Holding parameters place on B blocker - Resolved.  C. difficile colitis:  - Continue Flagyl+ PO vancomycin. Will Consider 3/13 as first full day of treatment (3/13 NG tube placed and patient  able to keep all medication down)  - Recheck C Diff 3/19 - CT scan abdomen and pelvis completed; significant pathology to include possible neoplasm see results. - Dr. Delfin Edis (GI) conducted Colonoscopy on 3/15, which showed significant pathology see report. - GI recommends f/u colonoscopy outpatient (1 small tubular adenoma in colon).  Hx psychosis:  - Improving - Likely related to being off of Seroquel, Lamictal and Lexapro for almost one week. Continue medication via tube   Hypernatremia:  - Resolved  - Check CMP in the a.m.   Hypokalemia  -Will reassess next am with continued feeds. - replace orally  Hypomagnesemia:  - Currently 1.4 on 3/19 - Replace today - Monitor  Nausea vomiting  - NG tube placed 3/13, d/c on 3/19  - Dobbhoff placed 3/17 - Most likely secondary to gastroparesis and patient significant pathology i.e. sophagitis/duodenitis/duodenal ulcer.  - Continue scheduled Zofran  - Reglan 5 mg QID   Code Status: Full code  Family Communication: no family at bedside. Disposition Plan: Resolution of refractory N/V; Physical/occupational therapy recommending skilled nursing.    Consultants:  Gastroenterology  Antibiotics: Metronidazole- day 9 Vancomycin- day 9  HPI/Subjective: Patient has no new complaints. No acute issues overnight.   Objective: Filed Vitals:   11/30/13 1345  BP: 104/68  Pulse: 88  Temp: 98.2 F (36.8 C)  Resp: 18    Intake/Output Summary (Last 24 hours) at 11/30/13 1521 Last data filed at 11/30/13 1300  Gross per 24 hour  Intake    568 ml  Output   1677 ml  Net  -1109 ml   Filed Weights   11/27/13 2008  11/28/13 2010 11/29/13 2019  Weight: 124.467 kg (274 lb 6.4 oz) 124.013 kg (273 lb 6.4 oz) 123.3 kg (271 lb 13.2 oz)    Exam:   General:  Pt in NAD, Alert and awake  Cardiovascular: RRR, no MRG  Respiratory: CTA BL, no wheezes  Abdomen: soft, ND. + bowel sounds.   Musculoskeletal: no cyanosis or clubbing, 3+  edema to bilateral LE  Data Reviewed: Basic Metabolic Panel:  Recent Labs Lab 11/26/13 0510 11/27/13 0500 11/28/13 0620 11/29/13 0105 11/30/13 0553  NA 141 142 141 141 142  K 3.3* 3.5* 3.5* 3.2* 3.5*  CL 103 106 105 104 105  CO2 27 29 25 27 26   GLUCOSE 100* 112* 83 118* 74  BUN 6 8 8 10 9   CREATININE 0.96 0.94 0.93 0.91 0.95  CALCIUM 7.8* 7.6* 7.8* 7.8* 7.9*  MG 1.4* 1.5 1.3* 1.3* 1.2*  PHOS  --  2.7  --   --   --    Liver Function Tests:  Recent Labs Lab 11/26/13 0510 11/27/13 0500 11/28/13 0620 11/29/13 0105 11/30/13 0553  AST 11 10 14 14 14   ALT 6 6 5 6 6   ALKPHOS 72 86 60 73 61  BILITOT 0.2* <0.2* 0.2* <0.2* 0.2*  PROT 4.8* 4.5* 4.7* 4.7* 5.0*  ALBUMIN 1.8* 1.6* 1.6* 1.6* 1.8*   No results found for this basename: LIPASE, AMYLASE,  in the last 168 hours No results found for this basename: AMMONIA,  in the last 168 hours CBC:  Recent Labs Lab 11/26/13 0510 11/27/13 0500 11/28/13 0620 11/29/13 0105 11/30/13 0553  WBC 6.4 5.7 7.3 6.4 6.5  NEUTROABS 3.5 3.1 3.6 3.6 3.2  HGB 8.3* 7.7* 8.1* 7.9* 8.3*  HCT 24.9* 23.1* 24.2* 23.7* 24.4*  MCV 88.9 90.2 90.6 89.1 88.7  PLT 278 217 232 251 273   Cardiac Enzymes: No results found for this basename: CKTOTAL, CKMB, CKMBINDEX, TROPONINI,  in the last 168 hours BNP (last 3 results)  Recent Labs  11/19/2013 1512  PROBNP 1572.0*   CBG:  Recent Labs Lab 11/29/13 2009 11/30/13 0007 11/30/13 0404 11/30/13 0726 11/30/13 1127  GLUCAP 91 84 78 78 82    Recent Results (from the past 240 hour(s))  CLOSTRIDIUM DIFFICILE BY PCR     Status: None   Collection Time    11/27/13 10:46 AM      Result Value Ref Range Status   C difficile by pcr NEGATIVE  NEGATIVE Final     Studies: No results found.  Scheduled Meds: . buPROPion  150 mg Oral Daily  . chlorpheniramine-HYDROcodone  5 mL Per Tube Q12H  . escitalopram  20 mg Oral Daily  . feeding supplement (PRO-STAT SUGAR FREE 64)  30 mL Per Tube Daily  .  guaiFENesin  15 mL Per Tube 4 times per day  . lamoTRIgine  200 mg Oral Daily  . levothyroxine  88 mcg Oral QAC breakfast  . metoCLOPramide (REGLAN) injection  5 mg Intravenous 4 times per day  . metoprolol tartrate  12.5 mg Oral BID  . ondansetron (ZOFRAN) IV  4 mg Intravenous 4 times per day  . pantoprazole (PROTONIX) IV  40 mg Intravenous Q12H  . QUEtiapine  100 mg Oral BID  . saccharomyces boulardii  250 mg Oral BID  . sucralfate  1 g Oral TID AC & HS   Continuous Infusions: . feeding supplement (OSMOLITE 1.2 CAL) Stopped (11/29/13 1115)  . heparin 1,050 Units/hr (11/30/13 7829)    Principal Problem:  Aspiration pneumonia Active Problems:   Severe Clostridium difficile enterocolitis   Acute respiratory failure   Hematemesis   Dysphagia, pharyngoesophageal phase   H/O psychosis   Pulmonary hypertension   Duodenal ulcer, acute   Reflux esophagitis   Other and unspecified noninfectious gastroenteritis and colitis(558.9)   Ulceration of intestine   Benign neoplasm of colon    Time spent: > 35 minutes    Velvet Bathe  [facility] Pager [phone] If 7PM-7AM, please contact night-coverage at [email or web address], password [facility] 11/30/2013, 3:21 PM  LOS: 22 days

## 2013-12-01 DIAGNOSIS — I82409 Acute embolism and thrombosis of unspecified deep veins of unspecified lower extremity: Secondary | ICD-10-CM

## 2013-12-01 LAB — CBC WITH DIFFERENTIAL/PLATELET
BASOS ABS: 0 10*3/uL (ref 0.0–0.1)
BASOS PCT: 0 % (ref 0–1)
Eosinophils Absolute: 0.2 10*3/uL (ref 0.0–0.7)
Eosinophils Relative: 3 % (ref 0–5)
HCT: 23.7 % — ABNORMAL LOW (ref 36.0–46.0)
Hemoglobin: 7.8 g/dL — ABNORMAL LOW (ref 12.0–15.0)
Lymphocytes Relative: 33 % (ref 12–46)
Lymphs Abs: 2 10*3/uL (ref 0.7–4.0)
MCH: 29.3 pg (ref 26.0–34.0)
MCHC: 32.9 g/dL (ref 30.0–36.0)
MCV: 89.1 fL (ref 78.0–100.0)
MONO ABS: 0.8 10*3/uL (ref 0.1–1.0)
Monocytes Relative: 13 % — ABNORMAL HIGH (ref 3–12)
NEUTROS ABS: 3.1 10*3/uL (ref 1.7–7.7)
NEUTROS PCT: 52 % (ref 43–77)
Platelets: 289 10*3/uL (ref 150–400)
RBC: 2.66 MIL/uL — ABNORMAL LOW (ref 3.87–5.11)
RDW: 20.9 % — AB (ref 11.5–15.5)
WBC: 6.1 10*3/uL (ref 4.0–10.5)

## 2013-12-01 LAB — COMPREHENSIVE METABOLIC PANEL
ALK PHOS: 55 U/L (ref 39–117)
ALT: 6 U/L (ref 0–35)
AST: 14 U/L (ref 0–37)
Albumin: 1.8 g/dL — ABNORMAL LOW (ref 3.5–5.2)
BUN: 7 mg/dL (ref 6–23)
CALCIUM: 7.9 mg/dL — AB (ref 8.4–10.5)
CO2: 26 meq/L (ref 19–32)
Chloride: 106 mEq/L (ref 96–112)
Creatinine, Ser: 0.96 mg/dL (ref 0.50–1.10)
GFR calc non Af Amer: 62 mL/min — ABNORMAL LOW (ref >90)
GFR, EST AFRICAN AMERICAN: 72 mL/min — AB (ref >90)
GLUCOSE: 73 mg/dL (ref 70–99)
POTASSIUM: 3.1 meq/L — AB (ref 3.7–5.3)
Sodium: 143 mEq/L (ref 137–147)
Total Bilirubin: 0.2 mg/dL — ABNORMAL LOW (ref 0.3–1.2)
Total Protein: 4.9 g/dL — ABNORMAL LOW (ref 6.0–8.3)

## 2013-12-01 LAB — HEPARIN LEVEL (UNFRACTIONATED): HEPARIN UNFRACTIONATED: 0.5 [IU]/mL (ref 0.30–0.70)

## 2013-12-01 LAB — GLUCOSE, CAPILLARY
GLUCOSE-CAPILLARY: 71 mg/dL (ref 70–99)
Glucose-Capillary: 75 mg/dL (ref 70–99)
Glucose-Capillary: 79 mg/dL (ref 70–99)
Glucose-Capillary: 82 mg/dL (ref 70–99)
Glucose-Capillary: 90 mg/dL (ref 70–99)

## 2013-12-01 LAB — MAGNESIUM: MAGNESIUM: 1.2 mg/dL — AB (ref 1.5–2.5)

## 2013-12-01 MED ORDER — WARFARIN - PHARMACIST DOSING INPATIENT
Freq: Every day | Status: DC
  Administered 2013-12-03: 18:00:00

## 2013-12-01 MED ORDER — WARFARIN SODIUM 10 MG PO TABS
10.0000 mg | ORAL_TABLET | Freq: Once | ORAL | Status: AC
  Administered 2013-12-01: 10 mg via ORAL
  Filled 2013-12-01: qty 1

## 2013-12-01 MED ORDER — ENSURE COMPLETE PO LIQD: 237.0000 mL | Freq: Two times a day (BID) | ORAL | Status: DC

## 2013-12-01 MED ORDER — BOOST / RESOURCE BREEZE PO LIQD
1.0000 | Freq: Two times a day (BID) | ORAL | Status: DC
  Administered 2013-12-01: 1 via ORAL

## 2013-12-01 MED ORDER — PATIENT'S GUIDE TO USING COUMADIN BOOK
Freq: Once | Status: AC
  Administered 2013-12-01: 20:00:00
  Filled 2013-12-01: qty 1

## 2013-12-01 MED ORDER — WARFARIN VIDEO
Freq: Once | Status: AC
  Administered 2013-12-01: 18:00:00

## 2013-12-01 NOTE — Progress Notes (Signed)
Physical Therapy Treatment Patient Details Name: [name] MRN: [id number] DOB: 01-07-1951 Today's Date: 12/01/2013    History of Present Illness Pt improving, but still has functional impairments. She will need SNF as she lives alone    PT Comments    Pt motivated to get stronger  Follow Up Recommendations  SNF     Equipment Recommendations  None recommended by PT    Recommendations for Other Services       Precautions / Restrictions      Mobility  Bed Mobility Overal bed mobility: Needs Assistance Bed Mobility: Sit to Supine     Supine to sit: Mod assist     General bed mobility comments: needs assist to bring legs up onto bed when returning to supine.  Ablet to bend legs up assist wtih pushing toward head of bed  Transfers Overall transfer level: Needs assistance Equipment used: Rolling walker (2 wheeled) Transfers: Sit to/from Stand Sit to Stand: From elevated surface;Mod assist         General transfer comment: pt continues to have difficulty coming to standing from sitting position.  Ambulation/Gait Ambulation/Gait assistance: Mod assist Ambulation Distance (Feet): 6 Feet Assistive device: Rolling walker (2 wheeled) Gait Pattern/deviations: Step-to pattern;Decreased dorsiflexion - left;Decreased dorsiflexion - right;Wide base of support Gait velocity: decreased   General Gait Details: pt self limits ambulaton distance as she is bothered by diarrhea. She has minor loss of balance when maneuvering in room to get from chair to bed as she was avoiding pulling on her lines and tubes. Pt upset about this as she realized her weakness.   Stairs            Wheelchair Mobility    Modified Rankin (Stroke Patients Only)       Balance Overall balance assessment: Needs assistance Sitting-balance support: Bilateral upper extremity supported Sitting balance-Leahy Scale: Good Sitting balance - Comments: able to sit on EOB unsupported   Standing  balance support: Bilateral upper extremity supported;During functional activity (with RW) Standing balance-Leahy Scale: Fair Standing balance comment: standing with walker, but feels weak. side stepped up to head of bed                    Cognition Arousal/Alertness: Awake/alert Behavior During Therapy: WFL for tasks assessed/performed Overall Cognitive Status: Within Functional Limits for tasks assessed                      Exercises General Exercises - Lower Extremity Ankle Circles/Pumps: AROM;Both;15 reps;Supine (pt with edema and dry scaly sking on both feet. Lotion applied) [employer]: AROM;10 reps;Supine Gluteal Sets: AROM;10 reps;Supine Heel Slides: AAROM;Both;Supine Hip Flexion/Marching: AROM;Both;5 reps;Seated    General Comments        Pertinent Vitals/Pain Pt with some dyspnea with exertion    Home Living                      Prior Function            PT Goals (current goals can now be found in the care plan section) Progress towards PT goals: Progressing toward goals    Frequency  Min 2X/week    PT Plan Current plan remains appropriate    End of Session Equipment Utilized During Treatment: Gait belt Activity Tolerance: Patient limited by fatigue Patient left: in chair;with call bell/phone within reach     Time: 1342-1414 PT Time Calculation (min): 32 min  Charges:  $Gait Training: 8-22  mins $Therapeutic Exercise: 8-22 mins                    G Codes:     Teresa K. Owens Shark, [place] 12/01/2013, 3:21 PM

## 2013-12-01 NOTE — Progress Notes (Signed)
[facility] PROGRESS NOTE  [name] QMV:[id number] DOB: May 16, 1951 DOA: 11-24-13 PCP: [name], MD  Assessment/Plan:  Acute respiratory failure:  - Secondary to aspiration pneumonia.  - Resolved  Upper GI bleed:  - Patient has significant pathology; esophagitis, duodenal ulcer, duodenitis. See EGD report below  - All medication to be administered via Dobhoff (carafate, PPI po bid, bid pepcid, florastor)   Aspiration Pneumonia:  - Patient was made n.p.o. secondary to refractory N./V. and increased risk of aspirating.  - Patient had been without nutrition since admission on 3/1. Started on NGT feeds on 3/15 with consult nutrition and pharmacy for tube feedings.  - 3/16 unable to continue NG tube feeding secondary to large residuals.  - 3/17 S/P IR placement of Dobhoff tube with termination in the duodenum  - 3/17 Started enteral feedings via Dobhoff, using enteral feeds for non- ICU patient protocol.  - 3/19 no NGT residuals, D/c NGT, continue Dobbhoff feeds. - At this point NG tube clamped  Dysphagia:  - Patient has significant pathology; esophagitis, duodenal ulcer, duodenitis. See EGD report below  - Patient had been without nutrition since admission on 3/1, see above - 3/16 ASAP order placed for fluoroscopically guided placement of Dobhoff feeding to placement into the duodenum.  - 3/17 Dobhoff tube successfully placed and feedings started but discontinued so that patient can be advanced to clears - 3/22 Clamped NG tube and currently in the process of advancing patient's diet. Advance diet today 12/01/2013 to bland diet  Left upper extremity DVT - Patient is currently on heparin and I will bridge to Coumadin - Coumadin per pharmacy consult  Depression  -continue Lexapro, Wellbutrin, via tube   Hypothyroidism:  -Synthroid 88 mcg per tube   Hyperlipidemia:  - Stable.  - lipid panel within NCEP guidelines   Hypotension - Holding parameters place on  B blocker - Resolved.  C. difficile colitis:  - Continue Flagyl+ PO vancomycin. Will Consider 3/13 as first full day of treatment (3/13 NG tube placed and patient able to keep all medication down)  - Recheck C Diff 3/19 - CT scan abdomen and pelvis completed; significant pathology to include possible neoplasm see results. - Dr. Delfin Edis (GI) conducted Colonoscopy on 3/15, which showed significant pathology see report. - GI recommends f/u colonoscopy outpatient (1 small tubular adenoma in colon).  Hx psychosis:  - Improving - Likely related to being off of Seroquel, Lamictal and Lexapro for almost one week. Continue medication via tube   Hypernatremia:  - Resolved  - Check CMP in the a.m.   Hypokalemia  -Will reassess next am with continued feeds. - replace orally  Hypomagnesemia:  - Currently 0.9 on 3/24 - Replace today - Monitor  Nausea vomiting  - NG tube placed 3/13, d/c on 3/19  - Dobbhoff placed 3/17 - Most likely secondary to gastroparesis and patient significant pathology i.e. sophagitis/duodenitis/duodenal ulcer.  - Continue scheduled Zofran  - Reglan 5 mg QID   Code Status: Full code  Family Communication: no family at bedside. Disposition Plan: Resolution of refractory N/V; Physical/occupational therapy recommending skilled nursing.    Consultants:  Gastroenterology  Antibiotics: Metronidazole- day 9 Vancomycin- day 9  HPI/Subjective: Patient has no new complaints. No acute issues overnight.   Objective: Filed Vitals:   12/01/13 1000  BP: 136/74  Pulse: 93  Temp: 98 F (36.7 C)  Resp: 18    Intake/Output Summary (Last 24 hours) at 12/01/13 1733 Last data filed at 12/01/13 2841  Gross  per 24 hour  Intake    194 ml  Output   1751 ml  Net  -1557 ml   Filed Weights   11/28/13 2010 11/29/13 2019 11/30/13 2025  Weight: 124.013 kg (273 lb 6.4 oz) 123.3 kg (271 lb 13.2 oz) 122.7 kg (270 lb 8.1 oz)    Exam:   General:  Pt in NAD, Alert  and awake  Cardiovascular: RRR, no MRG  Respiratory: CTA BL, no wheezes  Abdomen: soft, ND. + bowel sounds.   Musculoskeletal: no cyanosis or clubbing, 3+ edema to bilateral LE  Data Reviewed: Basic Metabolic Panel:  Recent Labs Lab 11/27/13 0500 11/28/13 0620 11/29/13 0105 11/30/13 0553 12/01/13 0610  NA 142 141 141 142 143  K 3.5* 3.5* 3.2* 3.5* 3.1*  CL 106 105 104 105 106  CO2 29 25 27 26 26   GLUCOSE 112* 83 118* 74 73  BUN 8 8 10 9 7   CREATININE 0.94 0.93 0.91 0.95 0.96  CALCIUM 7.6* 7.8* 7.8* 7.9* 7.9*  MG 1.5 1.3* 1.3* 1.2* 1.2*  PHOS 2.7  --   --   --   --    Liver Function Tests:  Recent Labs Lab 11/27/13 0500 11/28/13 0620 11/29/13 0105 11/30/13 0553 12/01/13 0610  AST 10 14 14 14 14   ALT 6 5 6 6 6   ALKPHOS 86 60 73 61 55  BILITOT <0.2* 0.2* <0.2* 0.2* 0.2*  PROT 4.5* 4.7* 4.7* 5.0* 4.9*  ALBUMIN 1.6* 1.6* 1.6* 1.8* 1.8*   No results found for this basename: LIPASE, AMYLASE,  in the last 168 hours No results found for this basename: AMMONIA,  in the last 168 hours CBC:  Recent Labs Lab 11/27/13 0500 11/28/13 0620 11/29/13 0105 11/30/13 0553 12/01/13 0610  WBC 5.7 7.3 6.4 6.5 6.1  NEUTROABS 3.1 3.6 3.6 3.2 3.1  HGB 7.7* 8.1* 7.9* 8.3* 7.8*  HCT 23.1* 24.2* 23.7* 24.4* 23.7*  MCV 90.2 90.6 89.1 88.7 89.1  PLT 217 232 251 273 289   Cardiac Enzymes: No results found for this basename: CKTOTAL, CKMB, CKMBINDEX, TROPONINI,  in the last 168 hours BNP (last 3 results)  Recent Labs  11/20/2013 1512  PROBNP 1572.0*   CBG:  Recent Labs Lab 11/30/13 2005 12/01/13 0007 12/01/13 0403 12/01/13 0800 12/01/13 1223  GLUCAP 72 75 79 82 90    Recent Results (from the past 240 hour(s))  CLOSTRIDIUM DIFFICILE BY PCR     Status: None   Collection Time    11/27/13 10:46 AM      Result Value Ref Range Status   C difficile by pcr NEGATIVE  NEGATIVE Final     Studies: No results found.  Scheduled Meds: . buPROPion  150 mg Oral Daily  .  chlorpheniramine-HYDROcodone  5 mL Per Tube Q12H  . escitalopram  20 mg Oral Daily  . feeding supplement (PRO-STAT SUGAR FREE 64)  30 mL Per Tube Daily  . feeding supplement (RESOURCE BREEZE)  1 Container Oral BID BM  . guaiFENesin  15 mL Per Tube 4 times per day  . HYDROcodone-acetaminophen  2 tablet Oral Once  . lamoTRIgine  200 mg Oral Daily  . levothyroxine  88 mcg Oral QAC breakfast  . metoCLOPramide (REGLAN) injection  5 mg Intravenous 4 times per day  . metoprolol tartrate  12.5 mg Oral BID  . ondansetron (ZOFRAN) IV  4 mg Intravenous 4 times per day  . pantoprazole (PROTONIX) IV  40 mg Intravenous Q12H  . QUEtiapine  100  mg Oral BID  . saccharomyces boulardii  250 mg Oral BID  . sucralfate  1 g Oral TID AC & HS   Continuous Infusions: . feeding supplement (OSMOLITE 1.2 CAL) Stopped (11/29/13 1115)  . heparin 1,050 Units/hr (12/01/13 1610)    Principal Problem:   Aspiration pneumonia Active Problems:   Severe Clostridium difficile enterocolitis   Acute respiratory failure   Hematemesis   Dysphagia, pharyngoesophageal phase   H/O psychosis   Pulmonary hypertension   Duodenal ulcer, acute   Reflux esophagitis   Other and unspecified noninfectious gastroenteritis and colitis(558.9)   Ulceration of intestine   Benign neoplasm of colon    Time spent: > 35 minutes    Velvet Bathe  [facility] Pager [phone] If 7PM-7AM, please contact night-coverage at [email or web address], password [facility] 12/01/2013, 5:33 PM  LOS: 23 days

## 2013-12-01 NOTE — Progress Notes (Signed)
NUTRITION FOLLOW UP  Pt meets criteria for severe MALNUTRITION in the context of acute illness as evidenced by intake of <50% x at least 5 days and moderate muscle wasting.  Intervention:   Provide Resource Breeze PO BID between meals, each supplement provides 250 kcals and 9 grams of protein.  Initiate calorie count for 48 hours. Discussed with patient.  Will continue to follow nutrition care plan.  Nutrition Dx:   Inadequate oral intake now related to GI distress AEB inability to tolerate PO's. Ongoing.  Goal:   Pt to meet >/= 90% of their estimated nutrition needs; unmet  Monitor:   PO intake, weight trends, labs, I/O's  Assessment:   63 year old obese female with history of HTN, GERD, depression, obesity and lumbar spondylosis status post lumbar decompression surgery in January 2015. Just discharged 11/04/13 after treatment for c.diff colitis and salmonella enterocolitis. Hospitalization complicated by encephalopathy and acute renal failure (peak creatinine 3.03). Admitted 2/25 with respiratory failure thought due to combination PNA and HF.   Patient was extubated on 3/5. BSE on 3/6 with recommendations for Dysphagia 1 with Nectar Thick liquids. Pt has been on and off of a full liquid diet since 3/6. Pt underwent endoscopy 3/11. Findings consistent with severe reflux esophagitis. Pt with likely gastroparesis. CT scan of abdomen worrisome for rectal mass and colitis. Pt underwent NGT placement per RN on 3/13 for low suction to prevent continuous reflux and possible aspiration.   Colonoscopy on 3/15 with findings of polyps, mild diverticulosis, and cecal ulcerations.   RD consulted to initiation enteral nutrition on 3/15, feedings were initiated, however MD discontinued enteral nutrition later that afternoon; per RN, pt was given 30 ml of Osmolite 1.2, tube was clamped, and pt had 40 ml of residual 1 hour later.    3/18-Postpyloric feeding tube placement on 3/16, per notes - tip of  feeding tube near ligament of Treitz in good position.   3/20-TF d/c for clear liquid diet trial.  3/24-Pt reports not having an appetite. Per RN, pt only had a few sips of her clear liquid diet this morning. Pt will be started on a soft diet for dinner. Pt is willing to try Ensure--will order. Will monitor pt tolerance with the soft diet. Per RN, if soft diet is not tolerated--will resume tube feeding. Pt with ongoing diarrhea. C. Diff negative. Pt denies any stomach pains, nausea, or difficulties chewing.   A calorie count for 48 hours will be initiated to assess calorie and protein intake to access if pt will need to continue nutrition support (enteral tube feeding) or if PO intake will appropriately meet estimated nutrition needs.  Pt has NGT still placed, but is clamped. Spoke with case manager about nutrition goals/interventions. Will continue to monitor.  Labs: Low magnesium, potassium, calcium, total protein, albumin, total bilirubin, and GFR.  Height: Ht Readings from Last 1 Encounters:  11/17/13 5\' 6"  (1.676 m)    Weight Status:   Wt Readings from Last 1 Encounters:  11/30/13 270 lb 8.1 oz (122.7 kg)  Admit wt(12/05/2013)250 lb  Re-estimated needs:  Kcal: 1800 - 2100  Protein: 100 - 110  Fluid: 1.8 - 2 L/day  Skin: Stage II to upper medial sacrum  Stage II to mid medial sacrum  Stage II to lower medial sacrum  Diet Order: Criss Rosales   Intake/Output Summary (Last 24 hours) at 12/01/13 1600 Last data filed at 12/01/13 0653  Gross per 24 hour  Intake    224 ml  Output  1751 ml  Net  -1527 ml    Last BM: 3/24-loose (pt with ongoing diarrhea)   Labs:   Recent Labs Lab 11/27/13 0500  11/29/13 0105 11/30/13 0553 12/01/13 0610  NA 142  < > 141 142 143  K 3.5*  < > 3.2* 3.5* 3.1*  CL 106  < > 104 105 106  CO2 29  < > 27 26 26   BUN 8  < > 10 9 7   CREATININE 0.94  < > 0.91 0.95 0.96  CALCIUM 7.6*  < > 7.8* 7.9* 7.9*  MG 1.5  < > 1.3* 1.2* 1.2*  PHOS 2.7  --   --    --   --   GLUCOSE 112*  < > 118* 74 73  < > = values in this interval not displayed.  CBG (last 3)   Recent Labs  12/01/13 0403 12/01/13 0800 12/01/13 1223  GLUCAP 79 82 90    Scheduled Meds: . buPROPion  150 mg Oral Daily  . chlorpheniramine-HYDROcodone  5 mL Per Tube Q12H  . escitalopram  20 mg Oral Daily  . feeding supplement (PRO-STAT SUGAR FREE 64)  30 mL Per Tube Daily  . guaiFENesin  15 mL Per Tube 4 times per day  . HYDROcodone-acetaminophen  2 tablet Oral Once  . lamoTRIgine  200 mg Oral Daily  . levothyroxine  88 mcg Oral QAC breakfast  . metoCLOPramide (REGLAN) injection  5 mg Intravenous 4 times per day  . metoprolol tartrate  12.5 mg Oral BID  . ondansetron (ZOFRAN) IV  4 mg Intravenous 4 times per day  . pantoprazole (PROTONIX) IV  40 mg Intravenous Q12H  . QUEtiapine  100 mg Oral BID  . saccharomyces boulardii  250 mg Oral BID  . sucralfate  1 g Oral TID AC & HS    Continuous Infusions: . feeding supplement (OSMOLITE 1.2 CAL) Stopped (11/29/13 1115)  . heparin 1,050 Units/hr (12/01/13 3762)    Kallie Locks Dietetic Intern Pager: [phone]    I agree with the above information and made appropriate revisions. Inda Coke MS, RD, LDN Inpatient Registered Dietitian Pager: [phone] After-hours pager: [phone]

## 2013-12-01 NOTE — Progress Notes (Addendum)
ANTICOAGULATION CONSULT NOTE - Follow Up Consult  Pharmacy Consult for heparin / warfarin Indication: DVT  Allergies  Allergen Reactions  . Aspirin Rash    Patient Measurements: Height: 5\' 6"  (167.6 cm) Weight: 270 lb 8.1 oz (122.7 kg) IBW/kg (Calculated) : 59.3 Heparin Dosing Weight: 89 kg  Vital Signs: Temp: 98.6 F (37 C) (03/24 0513) Temp src: Oral (03/24 0513) BP: 129/68 mmHg (03/24 0513) Pulse Rate: 95 (03/24 0513)  Labs:  Recent Labs  11/29/13 0105  11/29/13 2105 11/30/13 0553 12/01/13 0610  HGB 7.9*  --   --  8.3* 7.8*  HCT 23.7*  --   --  24.4* 23.7*  PLT 251  --   --  273 289  HEPARINUNFRC 0.63  < > 0.65 0.64 0.50  CREATININE 0.91  --   --  0.95 0.96  < > = values in this interval not displayed.  Estimated Creatinine Clearance: 81.2 ml/min (by C-G formula based on Cr of 0.96).   Assessment: Patient is a 63 y.o F on heparin for new DVT.  Heparin level is at goal with 0.50 this morning. Hgb down slightly from 8.3 to 7.8 toady. No new bleeding documented.  Goal of Therapy:  Heparin level 0.3-0.7 units/ml Monitor platelets by anticoagulation protocol: Yes   Plan:  1)  Continue current heparin drip at 1050 units/hr    Dia Sitter P 12/01/2013,9:13 AM  Added Coumadin 3/24 PM  Plan: 1) Coumadin 10 mg po x 1 dose tonight 2) Daily INR  Thank you. Anette Guarneri, PharmD

## 2013-12-02 DIAGNOSIS — I82409 Acute embolism and thrombosis of unspecified deep veins of unspecified lower extremity: Secondary | ICD-10-CM

## 2013-12-02 LAB — GLUCOSE, CAPILLARY
GLUCOSE-CAPILLARY: 60 mg/dL — AB (ref 70–99)
Glucose-Capillary: 79 mg/dL (ref 70–99)
Glucose-Capillary: 75 mg/dL (ref 70–99)
Glucose-Capillary: 88 mg/dL (ref 70–99)
Glucose-Capillary: 108 mg/dL — ABNORMAL HIGH (ref 70–99)

## 2013-12-02 LAB — CBC WITH DIFFERENTIAL/PLATELET
BASOS ABS: 0 10*3/uL (ref 0.0–0.1)
BASOS PCT: 1 % (ref 0–1)
EOS PCT: 2 % (ref 0–5)
Eosinophils Absolute: 0.1 10*3/uL (ref 0.0–0.7)
HCT: 22.4 % — ABNORMAL LOW (ref 36.0–46.0)
HEMOGLOBIN: 7.3 g/dL — AB (ref 12.0–15.0)
LYMPHS PCT: 41 % (ref 12–46)
Lymphs Abs: 2.3 10*3/uL (ref 0.7–4.0)
MCH: 29.8 pg (ref 26.0–34.0)
MCHC: 32.6 g/dL (ref 30.0–36.0)
MCV: 91.4 fL (ref 78.0–100.0)
MONO ABS: 0.8 10*3/uL (ref 0.1–1.0)
MONOS PCT: 14 % — AB (ref 3–12)
Neutro Abs: 2.3 10*3/uL (ref 1.7–7.7)
Neutrophils Relative %: 42 % — ABNORMAL LOW (ref 43–77)
Platelets: 312 10*3/uL (ref 150–400)
RBC: 2.45 MIL/uL — AB (ref 3.87–5.11)
RDW: 21 % — AB (ref 11.5–15.5)
WBC: 5.5 10*3/uL (ref 4.0–10.5)

## 2013-12-02 LAB — COMPREHENSIVE METABOLIC PANEL
ALT: 6 U/L (ref 0–35)
AST: 13 U/L (ref 0–37)
Albumin: 1.7 g/dL — ABNORMAL LOW (ref 3.5–5.2)
Alkaline Phosphatase: 49 U/L (ref 39–117)
BUN: 7 mg/dL (ref 6–23)
CALCIUM: 7.6 mg/dL — AB (ref 8.4–10.5)
CHLORIDE: 103 meq/L (ref 96–112)
CO2: 23 meq/L (ref 19–32)
CREATININE: 0.98 mg/dL (ref 0.50–1.10)
GFR, EST AFRICAN AMERICAN: 70 mL/min — AB (ref >90)
GFR, EST NON AFRICAN AMERICAN: 61 mL/min — AB (ref >90)
GLUCOSE: 75 mg/dL (ref 70–99)
Potassium: 2.8 mEq/L — CL (ref 3.7–5.3)
Sodium: 140 mEq/L (ref 137–147)
Total Protein: 4.8 g/dL — ABNORMAL LOW (ref 6.0–8.3)

## 2013-12-02 LAB — HEPARIN LEVEL (UNFRACTIONATED): Heparin Unfractionated: 0.41 IU/mL (ref 0.30–0.70)

## 2013-12-02 LAB — PROTIME-INR
INR: 1.62 — AB (ref 0.00–1.49)
Prothrombin Time: 18.8 seconds — ABNORMAL HIGH (ref 11.6–15.2)

## 2013-12-02 LAB — MAGNESIUM: Magnesium: 1.1 mg/dL — ABNORMAL LOW (ref 1.5–2.5)

## 2013-12-02 MED ORDER — DEXTROSE-NACL 5-0.45 % IV SOLN
INTRAVENOUS | Status: DC
  Administered 2013-12-02 – 2013-12-05 (×5): via INTRAVENOUS
  Administered 2013-12-05: 1000 mL via INTRAVENOUS

## 2013-12-02 MED ORDER — POTASSIUM CHLORIDE 10 MEQ/100ML IV SOLN
10.0000 meq | Freq: Once | INTRAVENOUS | Status: AC
  Administered 2013-12-02: 10 meq via INTRAVENOUS
  Filled 2013-12-02: qty 100

## 2013-12-02 MED ORDER — VITAL AF 1.2 CAL PO LIQD
1000.0000 mL | ORAL | Status: DC
  Administered 2013-12-02 – 2013-12-10 (×8): 1000 mL
  Filled 2013-12-02 (×16): qty 1000

## 2013-12-02 MED ORDER — MAGNESIUM SULFATE 4000MG/100ML IJ SOLN
4.0000 g | Freq: Once | INTRAMUSCULAR | Status: AC
  Administered 2013-12-02: 4 g via INTRAVENOUS
  Filled 2013-12-02: qty 100

## 2013-12-02 MED ORDER — WARFARIN SODIUM 5 MG PO TABS
5.0000 mg | ORAL_TABLET | Freq: Once | ORAL | Status: AC
Start: 2013-12-02 — End: 2013-12-02
  Administered 2013-12-02: 5 mg via ORAL
  Filled 2013-12-02: qty 1

## 2013-12-02 MED ORDER — POTASSIUM CHLORIDE 10 MEQ/100ML IV SOLN
10.0000 meq | INTRAVENOUS | Status: AC
  Administered 2013-12-02 (×4): 10 meq via INTRAVENOUS
  Filled 2013-12-02 (×5): qty 100

## 2013-12-02 MED ORDER — OSMOLITE 1.2 CAL PO LIQD
1000.0000 mL | ORAL | Status: DC
  Administered 2013-12-02: 1000 mL
  Filled 2013-12-02: qty 1000

## 2013-12-02 MED ORDER — POTASSIUM CHLORIDE 10 MEQ/100ML IV SOLN
10.0000 meq | INTRAVENOUS | Status: DC
  Filled 2013-12-02 (×5): qty 100

## 2013-12-02 NOTE — Progress Notes (Signed)
CRITICAL VALUE ALERT  Critical value received:  K = 2.8  Date of notification:  12/02/13  Time of notification:  0603  Critical value read back:yes  Nurse who received alert:  K. Laurance Flatten, RN   MD notified (1st page):  Raliegh Ip Schorr  Time of first page:  0615  Responding MD:  Lamar Blinks  Time MD responded: New order to epic

## 2013-12-02 NOTE — Progress Notes (Signed)
Per Dr. Sherral Hammers, give 1 run of magnesium before giving the remaining 2 runs of potassium.  [name], [name]

## 2013-12-02 NOTE — Progress Notes (Signed)
Tube feed initiated at 24ml/hr. Will recheck residual in 4 hours and adjust rate as needed.  Aaron Edelman, [place]

## 2013-12-02 NOTE — Progress Notes (Addendum)
NUTRITION FOLLOW UP  Pt meets criteria for severe MALNUTRITION in the context of acute illness as evidenced by intake of <50% x at least 5 days and moderate muscle wasting.  Intervention:    In order to promote better GI tolerance, discontinue Osmolite 1.2. Initiate Vital AF 1.2 @ 20 ml/hr and advance by 10 ml every 8 hours to goal rate of 60 ml/hr to provide 1728 kcals, 108 grams of protein, and 1168 ml of free water.   Discontinue calorie count.   Pt at risk for refeeding syndrome, monitor potassium, phosphorous and magnesium for 3 days. Discussed with Dr. Sherral Hammers, RD ordered phosphorus lab for tomorrow.   Discontinue soft diet--change to NPO (given verbal order from Dr. Sherral Hammers.)   Per radiology notes, tip of feeding tube is near ligament of Treitz; discussed with RN that if this is still true, gastric residuals do not need to be checked, however pt should still be monitored for any signs of abdominal discomfort, intolerance or signs/symptoms aspiration.   Consider J-tube if permanent tube feeding is desired given gastroparesis.   Will continue to follow nutrition care plan.  Nutrition Dx:   Inadequate oral intake now related to GI distress AEB inability to tolerate PO's. Ongoing.  Goal:   Pt to meet >/= 90% of their estimated nutrition needs; unmet  Monitor:   PO intake, weight trends, labs, I/O's  Assessment:   63 year old obese female with history of HTN, GERD, depression, obesity and lumbar spondylosis status post lumbar decompression surgery in January 2015. Just discharged 11/04/13 after treatment for c.diff colitis and salmonella enterocolitis. Hospitalization complicated by encephalopathy and acute renal failure (peak creatinine 3.03). Admitted 2/25 with respiratory failure thought due to combination PNA and HF.   Patient was extubated on 3/5. BSE on 3/6 with recommendations for Dysphagia 1 with Nectar Thick liquids. Pt has been on and off of a full liquid diet since 3/6.  Pt underwent endoscopy 3/11. Findings consistent with severe reflux esophagitis. Pt with likely gastroparesis. CT scan of abdomen worrisome for rectal mass and colitis. Pt underwent NGT placement per RN on 3/13 for low suction to prevent continuous reflux and possible aspiration.   Colonoscopy on 3/15 with findings of polyps, mild diverticulosis, and cecal ulcerations.   RD consulted to initiation enteral nutrition on 3/15, feedings were initiated, however MD discontinued enteral nutrition later that afternoon; per RN, pt was given 30 ml of Osmolite 1.2, tube was clamped, and pt had 40 ml of residual 1 hour later.    3/18-Postpyloric feeding tube placement on 3/16, per notes - tip of feeding tube near ligament of Treitz in good position.   3/20-TF d/c for clear liquid diet trial.  3/24-Pt reports not having an appetite. Per RN, pt only had a few sips of her clear liquid diet this morning. Pt will be started on a soft diet for dinner. Pt is willing to try Ensure--will order. Will monitor pt tolerance with the soft diet. Per RN, if soft diet is not tolerated--will resume tube feeding. Pt with ongoing diarrhea. C. Diff negative. Pt denies any stomach pains, nausea, or difficulties chewing.  A calorie count for 48 hours will be initiated to assess calorie and protein intake to access if pt will need to continue nutrition support (enteral tube feeding) or if PO intake will appropriately meet estimated nutrition needs. Pt has NGT still placed, but is clamped. Spoke with case manager about nutrition goals/interventions. Will continue to monitor.  3/25- Calorie count was discontinued due  to pt not tolerating diet. Pt was only able to take a few bites of her food and reports it would get stuck in her throat or she would choke. Difficulty swallowing is likely due to ulcerations in her esophagus. Pt reports not having an appetite.Tube feeding was resumed, but since pt with ongoing diarrhea--will switch tube  feeding formula to Vital AF 1.2 for easier digestion. Consider j-tube if permanent tube feeding is desired due to pt gastroparesis. Noted pt diet is still soft diet--will change to NPO (given verbal consent from Dr. Sherral Hammers). Will continue to follow pt and management pt tube feedings.  Labs: low potassium and magnesium (requiring ongoing repletion), calcium, total protein, albumin, and total bilirubin.  Height: Ht Readings from Last 1 Encounters:  12/01/13 5\' 6"  (1.676 m)    Weight Status:   Wt Readings from Last 1 Encounters:  12/01/13 269 lb 11.2 oz (122.335 kg)  Admit wt(11/21/2013)250 lb  Re-estimated needs:  Kcal: 1800 - 2100  Protein: 100 - 110  Fluid: 1.8 - 2 L/day  Skin: Stage II to upper medial sacrum  Stage II to mid medial sacrum  Stage II to lower medial sacrum  Diet Order: NPO   Intake/Output Summary (Last 24 hours) at 12/02/13 1652 Last data filed at 12/02/13 1634  Gross per 24 hour  Intake     20 ml  Output   1950 ml  Net  -1930 ml    Last BM: 3/24-loose (pt with ongoing diarrhea)   Labs:   Recent Labs Lab 11/27/13 0500  11/30/13 0553 12/01/13 0610 12/02/13 0432  NA 142  < > 142 143 140  K 3.5*  < > 3.5* 3.1* 2.8*  CL 106  < > 105 106 103  CO2 29  < > 26 26 23   BUN 8  < > 9 7 7   CREATININE 0.94  < > 0.95 0.96 0.98  CALCIUM 7.6*  < > 7.9* 7.9* 7.6*  MG 1.5  < > 1.2* 1.2* 1.1*  PHOS 2.7  --   --   --   --   GLUCOSE 112*  < > 74 73 75  < > = values in this interval not displayed.  CBG (last 3)   Recent Labs  12/01/13 1722 12/02/13 0748 12/02/13 1107  GLUCAP 71 79 60*    Scheduled Meds: . buPROPion  150 mg Oral Daily  . chlorpheniramine-HYDROcodone  5 mL Per Tube Q12H  . escitalopram  20 mg Oral Daily  . guaiFENesin  15 mL Per Tube 4 times per day  . HYDROcodone-acetaminophen  2 tablet Oral Once  . lamoTRIgine  200 mg Oral Daily  . levothyroxine  88 mcg Oral QAC breakfast  . metoprolol tartrate  12.5 mg Oral BID  . ondansetron  (ZOFRAN) IV  4 mg Intravenous 4 times per day  . pantoprazole (PROTONIX) IV  40 mg Intravenous Q12H  . QUEtiapine  100 mg Oral BID  . saccharomyces boulardii  250 mg Oral BID  . sucralfate  1 g Oral TID AC & HS  . warfarin  5 mg Oral ONCE-1800  . Warfarin - Pharmacist Dosing Inpatient   Does not apply q1800    Continuous Infusions: . dextrose 5 % and 0.45% NaCl 125 mL/hr at 12/02/13 1651  . feeding supplement (VITAL AF 1.2 CAL)    . heparin 1,050 Units/hr (12/02/13 0723)    Kallie Locks Dietetic Intern Pager: [phone]  I agree with the above information and made appropriate revisions.  Inda Coke MS, RD, LDN Inpatient Registered Dietitian Pager: [phone] After-hours pager: [phone]

## 2013-12-02 NOTE — Clinical Social Work Note (Signed)
CSW continuing to monitor patient's progress and will assist with d/c planning when appropriate. Patient from [place] and may return there based on bed availability at time of discharge.  Siyon Linck Givens, MSW, LCSW [phone],

## 2013-12-02 NOTE — Progress Notes (Signed)
[facility] PROGRESS NOTE  [name] YTK:[id number] DOB: [birth year] DOA: 11/29/13 PCP: [name], MD  Assessment/Plan:  Acute respiratory failure:  -Secondary to aspiration pneumonia.  -clinically improved -Continue NG tube -3/17 patient  S/P fluoroscopic placement of a Dobhoff tube   Pulmonary hypertension -3/17 BP slightly outside AHA guidelines continue metoprolol 12.5 mg BID    Upper GI bleed: -Patient has significant pathology; esophagitis, duodenal ulcer, duodenitis. See EGD report below -All medication to be administered via Dobhoff tube (carafate, PPI po bid, bid pepcid, florastor)  Left upper extremity DVT 3/20 - Patient is currently on heparin and I will bridge to Coumadin  - Coumadin per pharmacy consult   Aspiration Pneumonia:  -Patient made n.p.o. secondary to refractory N./V. and increased risk of aspirating. -Patient has been without nutrition since admission on 3/1 consult nutrition and pharmacy for NG tube feeding. -3/16 unable to continue NG tube feeding secondary to large residuals. Will need to place Dobhoff tube in the a.m Ensuring termination in the duodenum  -3/17 S/P IR placement of Dobhoff tube with termination in the duodenum -3/17 Start enteral feedings via Dobhoff, using enteral feeds for non- ICU patient protocol. -3/25 patient was tried on clear liquids and dysphagia diet and both have caused patient extreme episodes of N./V. DC any PO nutrition and restart feedings per Dobhoff tube. Dietitian to manage   Dysphagia:  -Patient has significant pathology; esophagitis, duodenal ulcer, duodenitis. See EGD report below -Patient has been without nutrition since admission on 3/1 consult nutrition and pharmacy for NG tube feeding. -3/16 ASAP order placed for fluoroscopically guided placement of Dobhoff feeding to placement into the duodenum. -3./17 Dobhoff tube successfully place -3/25 patient has been unable to tolerate clear liquid  diet; resulted in gagging/nausea/vomiting. Patient to be made n.p.o. and resume tube feedings Osmolite 1.2 CAL dietitian to manage -Discussed placement of PEG tube since patient has been tried numerous occasions PO feedings and failed will discuss with her family  Depression  -continue Lexapro, Wellbutrin, via  NG tube   Hypothyroidism:  -Synthroid 88 mcg per NG tube    Hyperlipidemia:  -Stable.  -lipid panel within NCEP guidelines   Hypertension:  -BP not within AHA guidelines continue metoprolol 12.5 mg BID    C. difficile colitis:  -Continue Flagyl+ PO vancomycin. Will Consider 3/13 as first full day of treatment (3/13 NG tube placed and patient able to keep all medication down) -CT scan abdomen and pelvis completed; significant pathology to include possible neoplasm see results below.  -Dr. Delfin Edis (GI) conducted Colonoscopy on 3/15, which showed significant pathology see report below, awaiting pathology reports -Please contact daughter [name]  [phone] with results of pathology report  Hx psychosis: -Likely being off of Seroquel, Lamictal and Lexapro for almost one week may be contributing factor to decreased me ntation and worsening swallowing. Continue via NG tube   Hypernatremia:  - Resolved -3/25 restart D5 1/2 normal saline 125 ml/hr, until patient tolerating enteral feeds via Dobhoff tube. -Check CMP in the a.m.   Hypokalemia  -3/14 replace k 6 runs secondary to refractory N./V., and patient having bowel prep for colonoscopy this evening   -3/15 replace potassium 10 meq x6 runs secondary to loss through NG tube/bowel preparation for her colonoscopy -3/16 replace potassium 10 meq x 5 runs -3/17 replace potassium 54meq x6 runs -3/25 replace potassium 79meq x 5 runs -Recheck in the a.m. . If continues to be problem reconsult nutrition/pharmacy to add potassium to enteral feeds  Hypomagnesemia:  -3/16  replete magnesium 3 gm IV  -3/25 replete magnesium 4 gm IV    Nausea vomiting  -NG tube placed 3/13 -Most likely secondary to gastroparesis + patient significant pathology i.e. sophagitis/duodenitis/duodenal ulcer.  -Continue scheduled Zofran  -Thorazine 25 mg BID; after patient tolerating enteral feeds consider changing to PRN/DC -3/25 DC Reglan 5 mg QID secondary to watery diarrhea starting 3/23       Code Status: Full code  Family Communication: None  Disposition Plan: Resolution of refractory N./V.; Physical therapy recommending skilled nursing, however patient may need LTAC placement given her possible PEG placement     Consultants: Dr. Delfin Edis (GI)   Procedures: Left upper extremity venous duplex on 11/27/2013 Consistent with acute deep and superficialvein thrombosis involving the left subclavian vein, left axillary vein, left brachial vein, and left cephalic vein.   Colonoscopy with cold biopsy polypectomy and Colonoscopy with biopsy 12/01/2013 -Two sessile polyps ranging between 3-68mm in size were found in the descending colon; polypectomy was performed with cold forceps  -There was mild diverticulosis noted in the sigmoid colon  - .diffuse nonspecific colitis throughout the lef,t transverse and right colon consistent with resolving colitis, status post random biopsies ,no evidence of pseudomembrane  - Cecal ulcerations likely resolving colitis. Biopsies taken to rule out ischemic etiology versus pseudomembranous colitis    Abdomen pelvis with contrast 11/20/2013 -Interval progression of now diffuse colonic wall thickening, surrounding stranding, and pericolonic fluid tracking to the pelvis. Findings are most suggestive of infectious or inflammatory colitis  such as may be seen with Clostridium difficile infection, ischemia, or less likely inflammatory bowel disease. No free air to suggest perforation.  -A polypoid intraluminal masslike filling defect is noted level of the rectum which could represent malignancy such as colon  cancer, although oval inpouching related to wall thickening could  appear similar. After the patient's current symptoms resolve, colonoscopy or sigmoidoscopy is recommended for further evaluation at direct visualization.  -New moderate right pleural effusion with right greater than left lower lobe atelectasis.  -New amorphous hyperdensity within the right lower lobe, which could indicate aspiration.   EGD 11/12/2013 -severe grade 4 reflux esophagitis status post biopsies  -Mild gastric retention  -Small duodenal ulcer without stigmata of bleeding  -Moderately severe duodenitis status post biopsies to rule out H. Pylori   Left PICC/Midline 3/13>> stopped 3/20  NG tube 3/13>>>  Intubation 3/2-3/5   NG tube 3/2-3/5   Right IJ central line 3/1-3/6  Echocardiogram 11/09/2013 - Left ventricle: The cavity size was normal. mild LVH.  -LVEF; 55% to 60%. - Atrial septum: No defect or patent foramen ovale was identified. - Pulmonary arteries: PA peak pressure: 31mm Hg (S).    Antibiotics: IV Zosyn 3/1-stopped 3/10 IV Flagyl 3/7>> PO vancomycin 3/7>>   HPI/Subjective: 62yo BF PMHx  hypertension, hypothyroidism and psychosis who was discharged on 2/25 after hospitalization for C. difficile colitis and we admitted on 3/1 for acute respiratory failure secondary to pneumonia. She was admitted to the critical care service and intubated. Echocardiogram checked and found to be unremarkable. Patient started on broad-spectrum antibiotics. By 3/5, she was able to be extubated and transferred to the hospitalist service in the step down unit starting 3/7. Trace pitting by speech therapy who initially recommended downgrading her diet to dysphagia with nectar thick liquids, however followup evaluation in 3/7 noted further downgraded to n.p.o. with meds only. Patient has been getting slightly more concentrated with increase in her sodium which was 150 by  3/7 and started on gentle half normal saline. C.  difficile cultures repeated and patient still positive. Still having some diarrhea. 3/11 patient states can not keep her vancomycin down. However talking to her RN when patient does not know she is taking vancomycin able to keep medication down. Currently n.p.o. scheduled for EGD today. 3/12 patient obtain EGD yesterday which showed significant pathology see report below. 3/13 patient feeling improved, negative N./V. since placement of NG tube 3/14 patient with only mild episodes of nausea, states she understands the plan outlined by Dr. Olevia Perches (GI) concerning the need for colonoscopy 3/15 patient states her N./V. has resolved the NG tube placement. Patient has tolerated contrast for CT scan, medications, and bowel prep for colonoscopy. 3/17 patient sleepy but arousable S/P placement of Dobhoff tube. Request that daughter [name]  [phone] be brought up-to-date on plan of care 3/25 patient verified that she has been unable to handle any PO nutrition without consistently having severe N./V.   Objective: Filed Vitals:   12/01/13 0513 12/01/13 1000 12/01/13 2311 12/02/13 0916  BP: 129/68 136/74 119/62 126/91  Pulse: 95 93 89 93  Temp: 98.6 F (37 C) 98 F (36.7 C) 98.7 F (37.1 C) 99 F (37.2 C)  TempSrc: Oral Oral Oral   Resp: 18 18 20 20   Height:   5\' 6"  (1.676 m)   Weight:   122.335 kg (269 lb 11.2 oz)   SpO2: 95% 96% 94% 98%    Intake/Output Summary (Last 24 hours) at 12/02/13 1207 Last data filed at 12/02/13 3016  Gross per 24 hour  Intake     20 ml  Output   1300 ml  Net  -1280 ml   Filed Weights   11/29/13 2019 11/30/13 2025 12/01/13 2311  Weight: 123.3 kg (271 lb 13.2 oz) 122.7 kg (270 lb 8.1 oz) 122.335 kg (269 lb 11.2 oz)    Exam:   General:  A./O. x4, NAD     Cardiovascular: Regular rhythm and rate,, negative murmurs rubs gallops  Respiratory: Clear to auscultation bilateral  Abdomen: Obese, mild right lower quadrant tenderness to palpation, plus bowel sound  (hypoactive)  Musculoskeletal: Positive 1+plus pedal edema bilateral to knee   Data Reviewed: Basic Metabolic Panel:  Recent Labs Lab 11/27/13 0500 11/28/13 0620 11/29/13 0105 11/30/13 0553 12/01/13 0610 12/02/13 0432  NA 142 141 141 142 143 140  K 3.5* 3.5* 3.2* 3.5* 3.1* 2.8*  CL 106 105 104 105 106 103  CO2 29 25 27 26 26 23   GLUCOSE 112* 83 118* 74 73 75  BUN 8 8 10 9 7 7   CREATININE 0.94 0.93 0.91 0.95 0.96 0.98  CALCIUM 7.6* 7.8* 7.8* 7.9* 7.9* 7.6*  MG 1.5 1.3* 1.3* 1.2* 1.2* 1.1*  PHOS 2.7  --   --   --   --   --    Liver Function Tests:  Recent Labs Lab 11/28/13 0620 11/29/13 0105 11/30/13 0553 12/01/13 0610 12/02/13 0432  AST 14 14 14 14 13   ALT 5 6 6 6 6   ALKPHOS 60 73 61 55 49  BILITOT 0.2* <0.2* 0.2* 0.2* <0.2*  PROT 4.7* 4.7* 5.0* 4.9* 4.8*  ALBUMIN 1.6* 1.6* 1.8* 1.8* 1.7*   No results found for this basename: LIPASE, AMYLASE,  in the last 168 hours No results found for this basename: AMMONIA,  in the last 168 hours CBC:  Recent Labs Lab 11/28/13 0620 11/29/13 0105 11/30/13 0553 12/01/13 0610 12/02/13 0432  WBC 7.3 6.4 6.5 6.1  5.5  NEUTROABS 3.6 3.6 3.2 3.1 2.3  HGB 8.1* 7.9* 8.3* 7.8* 7.3*  HCT 24.2* 23.7* 24.4* 23.7* 22.4*  MCV 90.6 89.1 88.7 89.1 91.4  PLT 232 251 273 289 312   Cardiac Enzymes: No results found for this basename: CKTOTAL, CKMB, CKMBINDEX, TROPONINI,  in the last 168 hours BNP (last 3 results)  Recent Labs  11/11/2013 1512  PROBNP 1572.0*   CBG:  Recent Labs Lab 12/01/13 0403 12/01/13 0800 12/01/13 1223 12/01/13 1722 12/02/13 0748  GLUCAP 79 82 90 71 79    Recent Results (from the past 240 hour(s))  CLOSTRIDIUM DIFFICILE BY PCR     Status: None   Collection Time    11/27/13 10:46 AM      Result Value Ref Range Status   C difficile by pcr NEGATIVE  NEGATIVE Final     Studies: No results found.  Scheduled Meds: . buPROPion  150 mg Oral Daily  . chlorpheniramine-HYDROcodone  5 mL Per Tube Q12H   . escitalopram  20 mg Oral Daily  . feeding supplement (PRO-STAT SUGAR FREE 64)  30 mL Per Tube Daily  . feeding supplement (RESOURCE BREEZE)  1 Container Oral BID BM  . guaiFENesin  15 mL Per Tube 4 times per day  . HYDROcodone-acetaminophen  2 tablet Oral Once  . lamoTRIgine  200 mg Oral Daily  . levothyroxine  88 mcg Oral QAC breakfast  . metoCLOPramide (REGLAN) injection  5 mg Intravenous 4 times per day  . metoprolol tartrate  12.5 mg Oral BID  . ondansetron (ZOFRAN) IV  4 mg Intravenous 4 times per day  . pantoprazole (PROTONIX) IV  40 mg Intravenous Q12H  . QUEtiapine  100 mg Oral BID  . saccharomyces boulardii  250 mg Oral BID  . sucralfate  1 g Oral TID AC & HS  . warfarin  5 mg Oral ONCE-1800  . Warfarin - Pharmacist Dosing Inpatient   Does not apply q1800   Continuous Infusions: . feeding supplement (OSMOLITE 1.2 CAL) Stopped (11/29/13 1115)  . heparin 1,050 Units/hr (12/02/13 0723)    Principal Problem:   Aspiration pneumonia Active Problems:   Severe Clostridium difficile enterocolitis   Acute respiratory failure   Hematemesis   Dysphagia, pharyngoesophageal phase   H/O psychosis   Pulmonary hypertension   Duodenal ulcer, acute   Reflux esophagitis   Other and unspecified noninfectious gastroenteritis and colitis(558.9)   Ulceration of intestine   Benign neoplasm of colon   DVT (deep venous thrombosis)    Time spent: 40 minute   Abdirahim Flavell, Fisher, J  [facility] Pager [phone]. If 7PM-7AM, please contact night-coverage at [email or web address], password [facility] 12/02/2013, 12:07 PM  LOS: 24 days

## 2013-12-02 NOTE — Care Management Note (Signed)
Spoke with Dr Sherral Hammers re pt ongoing issues with tol po diet, noted his plan to resume tube feedings. Have asked [facility] to review this pt for possible LTAC admission.  Jasmine Pang RN MPH, case manager, [phone]

## 2013-12-02 NOTE — Progress Notes (Signed)
ANTICOAGULATION CONSULT NOTE - Follow Up Consult  Pharmacy Consult for heparin and coumadin  Indication: DVT  Allergies  Allergen Reactions  . Aspirin Rash    Patient Measurements: Height: 5\' 6"  (167.6 cm) Weight: 269 lb 11.2 oz (122.335 kg) IBW/kg (Calculated) : 59.3 Heparin Dosing Weight: 89 kg  Vital Signs: Temp: 98.7 F (37.1 C) (03/24 2311) Temp src: Oral (03/24 2311) BP: 119/62 mmHg (03/24 2311) Pulse Rate: 89 (03/24 2311)  Labs:  Recent Labs  11/30/13 0553 12/01/13 0610 12/02/13 0432  HGB 8.3* 7.8* 7.3*  HCT 24.4* 23.7* 22.4*  PLT 273 289 312  LABPROT  --   --  18.8*  INR  --   --  1.62*  HEPARINUNFRC 0.64 0.50 0.41  CREATININE 0.95 0.96 0.98    Estimated Creatinine Clearance: 79.4 ml/min (by C-G formula based on Cr of 0.98).  Assessment: Patient is a 63 y.o F on anticoagulation overlap day #2 of 5 days minimum for new DVT.  Heparin level is at goal with 0.41 this morning.  INR increased noticeably to 1.62 after one dose of coumadin 10 mg given last night.  LFTs wnl.  Hgb low and trending down.  No bleeding documented.  Goal of Therapy:  INR 2-3 Heparin level 0.3-0.7 units/ml Monitor platelets by anticoagulation protocol: Yes   Plan:  1) continue heparin drip at 1050 units/hr 2) Coumadin 5 mg po x 1 dose tonight   Burnice Vassel P 12/02/2013,8:40 AM

## 2013-12-02 NOTE — Progress Notes (Signed)
Tube feeds changed to Vital 1.5 per dietician. Rate started at 42ml/hr. Orders to titrate dose every 8 hours as patient tolerates. Patient also made NPO, pt aware. Will monitor.  Aaron Edelman, Ladashia Demarinis [place]

## 2013-12-02 NOTE — Progress Notes (Signed)
Pt unable to tolerate diet.  Pt A&OX4.  VSS,. Pt receiving 5 runs of KCL.  Tube feeding restarted @ 20 ml/hr.  Pt also receiving magnesium IV due to low mag.  Pt very pleasant.  Dr. Sherral Hammers stated pt would speak to family about peg tube.

## 2013-12-03 LAB — COMPREHENSIVE METABOLIC PANEL
ALT: 7 U/L (ref 0–35)
AST: 13 U/L (ref 0–37)
Albumin: 1.9 g/dL — ABNORMAL LOW (ref 3.5–5.2)
Alkaline Phosphatase: 59 U/L (ref 39–117)
BUN: 5 mg/dL — ABNORMAL LOW (ref 6–23)
CALCIUM: 7.8 mg/dL — AB (ref 8.4–10.5)
CO2: 24 meq/L (ref 19–32)
CREATININE: 0.95 mg/dL (ref 0.50–1.10)
Chloride: 105 mEq/L (ref 96–112)
GFR calc Af Amer: 73 mL/min — ABNORMAL LOW (ref >90)
GFR, EST NON AFRICAN AMERICAN: 63 mL/min — AB (ref >90)
Glucose, Bld: 117 mg/dL — ABNORMAL HIGH (ref 70–99)
Potassium: 2.9 mEq/L — CL (ref 3.7–5.3)
Sodium: 140 mEq/L (ref 137–147)
TOTAL PROTEIN: 5.2 g/dL — AB (ref 6.0–8.3)
Total Bilirubin: <0.2 mg/dL — ABNORMAL LOW (ref 0.3–1.2)

## 2013-12-03 LAB — CBC WITH DIFFERENTIAL/PLATELET
Basophils Absolute: 0 10*3/uL (ref 0.0–0.1)
Basophils Relative: 0 % (ref 0–1)
EOS PCT: 2 % (ref 0–5)
Eosinophils Absolute: 0.1 10*3/uL (ref 0.0–0.7)
HCT: 24 % — ABNORMAL LOW (ref 36.0–46.0)
Hemoglobin: 8 g/dL — ABNORMAL LOW (ref 12.0–15.0)
Lymphocytes Relative: 32 % (ref 12–46)
Lymphs Abs: 1.8 10*3/uL (ref 0.7–4.0)
MCH: 30.3 pg (ref 26.0–34.0)
MCHC: 33.3 g/dL (ref 30.0–36.0)
MCV: 90.9 fL (ref 78.0–100.0)
Monocytes Absolute: 0.8 10*3/uL (ref 0.1–1.0)
Monocytes Relative: 14 % — ABNORMAL HIGH (ref 3–12)
NEUTROS PCT: 52 % (ref 43–77)
Neutro Abs: 2.9 10*3/uL (ref 1.7–7.7)
Platelets: 324 10*3/uL (ref 150–400)
RBC: 2.64 MIL/uL — AB (ref 3.87–5.11)
RDW: 21.2 % — ABNORMAL HIGH (ref 11.5–15.5)
WBC: 5.6 10*3/uL (ref 4.0–10.5)

## 2013-12-03 LAB — PROTIME-INR
INR: 1.97 — AB (ref 0.00–1.49)
Prothrombin Time: 21.8 seconds — ABNORMAL HIGH (ref 11.6–15.2)

## 2013-12-03 LAB — GLUCOSE, CAPILLARY
Glucose-Capillary: 109 mg/dL — ABNORMAL HIGH (ref 70–99)
Glucose-Capillary: 126 mg/dL — ABNORMAL HIGH (ref 70–99)
Glucose-Capillary: 110 mg/dL — ABNORMAL HIGH (ref 70–99)
Glucose-Capillary: 118 mg/dL — ABNORMAL HIGH (ref 70–99)
Glucose-Capillary: 105 mg/dL — ABNORMAL HIGH (ref 70–99)
Glucose-Capillary: 107 mg/dL — ABNORMAL HIGH (ref 70–99)

## 2013-12-03 LAB — HEPARIN LEVEL (UNFRACTIONATED)
Heparin Unfractionated: 0.26 IU/mL — ABNORMAL LOW (ref 0.30–0.70)
Heparin Unfractionated: 0.18 IU/mL — ABNORMAL LOW (ref 0.30–0.70)

## 2013-12-03 LAB — MAGNESIUM: MAGNESIUM: 1.8 mg/dL (ref 1.5–2.5)

## 2013-12-03 LAB — PHOSPHORUS: Phosphorus: 3.5 mg/dL (ref 2.3–4.6)

## 2013-12-03 MED ORDER — MAGNESIUM OXIDE 400 (241.3 MG) MG PO TABS
400.0000 mg | ORAL_TABLET | Freq: Once | ORAL | Status: AC
  Administered 2013-12-03: 400 mg
  Filled 2013-12-03: qty 1

## 2013-12-03 MED ORDER — BUPROPION HCL 75 MG PO TABS
75.0000 mg | ORAL_TABLET | Freq: Two times a day (BID) | ORAL | Status: DC
  Administered 2013-12-03 – 2013-12-11 (×17): 75 mg
  Filled 2013-12-03 (×19): qty 1

## 2013-12-03 MED ORDER — PANTOPRAZOLE SODIUM 40 MG PO PACK
40.0000 mg | PACK | Freq: Two times a day (BID) | ORAL | Status: DC
  Administered 2013-12-03 – 2013-12-11 (×15): 40 mg
  Filled 2013-12-03 (×20): qty 20

## 2013-12-03 MED ORDER — WARFARIN SODIUM 5 MG PO TABS
5.0000 mg | ORAL_TABLET | Freq: Once | ORAL | Status: AC
  Administered 2013-12-03: 5 mg via ORAL
  Filled 2013-12-03: qty 1

## 2013-12-03 MED ORDER — MAGNESIUM OXIDE 400 (241.3 MG) MG PO TABS
400.0000 mg | ORAL_TABLET | Freq: Two times a day (BID) | ORAL | Status: DC
  Administered 2013-12-03 – 2013-12-10 (×15): 400 mg
  Filled 2013-12-03 (×16): qty 1

## 2013-12-03 MED ORDER — POTASSIUM CHLORIDE 10 MEQ/100ML IV SOLN
10.0000 meq | INTRAVENOUS | Status: AC
  Administered 2013-12-03 (×5): 10 meq via INTRAVENOUS
  Filled 2013-12-03 (×5): qty 100

## 2013-12-03 NOTE — Progress Notes (Signed)
Physical Therapy Treatment Patient Details Name: [name] MRN: [id number] DOB: 11/24/1950 Today's Date: 12/03/2013    History of Present Illness 63 year old obese female with history of HTN, GERD, depression, obesity and lumbar spondylosis status post lumbar PLIF surgery in January 2015.  Just discharged 11/04/13 after treatment for c.diff colitis and salmonella enterocolitis. Hospitalization complicated by encephalopathy and acute renal failure (peak creatinine 3.03). Admitted 2/25 with respiratory failure though due to combination PNA and HF. Pt DC to [facility] after PLIF was home 1wk with falls and difficulty when she was readmitted last time then return to [facility] at that DC    PT Comments    Progressed with ambulation in hallway today.  Noted decreased shuffling gait.  Still needs assist for sit<>stand and lifting legs into bed.  Will need SNF rehab prior to d/c home.  Follow Up Recommendations  SNF     Equipment Recommendations  None recommended by PT    Recommendations for Other Services       Precautions / Restrictions Precautions Precautions: Fall;Back Precaution Comments: pt able to verbalize 3/3 back precautions, max multimodal cues required to maintain during bed mobility. Restrictions Weight Bearing Restrictions: No    Mobility  Bed Mobility Overal bed mobility: Needs Assistance Bed Mobility: Sit to Supine;Rolling;Supine to Sit;Sidelying to Sit;Sit to Sidelying Rolling: Min assist Sidelying to sit: Min assist   Sit to supine: Min assist Sit to sidelying: Min assist General bed mobility comments: cues and assist for technique and safety/positioning in bed  Transfers Overall transfer level: Needs assistance Equipment used: Rolling walker (2 wheeled) Transfers: Sit to/from Stand Sit to Stand: Mod assist Stand pivot transfers: Min assist       General transfer comment: increased time, cues for hand placement; lifting  assist  Ambulation/Gait Ambulation/Gait assistance: Min guard Ambulation Distance (Feet): 50 Feet Assistive device: Rolling walker (2 wheeled) Gait Pattern/deviations: Step-through pattern;Decreased stride length;Wide base of support     General Gait Details: increased lateral excursion for steps; fatigued with ambulation   Stairs            Wheelchair Mobility    Modified Rankin (Stroke Patients Only)       Balance Overall balance assessment: Needs assistance   Sitting balance-Leahy Scale: Good       Standing balance-Leahy Scale: Poor Standing balance comment: wide base of support and UE assist needed for balance (states son to bring her in some shoes which may help as well)                    Cognition Arousal/Alertness: Awake/alert Behavior During Therapy: WFL for tasks assessed/performed Overall Cognitive Status: Within Functional Limits for tasks assessed                      Exercises      General Comments        Pertinent Vitals/Pain No pain complaints    Home Living                      Prior Function            PT Goals (current goals can now be found in the care plan section) Progress towards PT goals: Progressing toward goals    Frequency  Min 2X/week    PT Plan Current plan remains appropriate    End of Session Equipment Utilized During Treatment: Gait belt Activity Tolerance: Patient limited by fatigue Patient left: in bed;with call  bell/phone within reach     Time: 1440-1500 PT Time Calculation (min): 20 min  Charges:  $Gait Training: 8-22 mins                    G Codes:      Beverley Sherrard,[name] December 07, 2013, 3:27 PM Magda Kiel, [place] 12/07/2013

## 2013-12-03 NOTE — Progress Notes (Signed)
Occupational Therapy Treatment Patient Details Name: [name] MRN: [id number] DOB: 1951/04/17 Today's Date: 12/03/2013    History of present illness 63 year old obese female with history of HTN, GERD, depression, obesity and lumbar spondylosis status post lumbar PLIF surgery in January 2015.  Just discharged 11/04/13 after treatment for c.diff colitis and salmonella enterocolitis. Hospitalization complicated by encephalopathy and acute renal failure (peak creatinine 3.03). Admitted 2/25 with respiratory failure though due to combination PNA and HF. Pt DC to [facility] after PLIF was home 1wk with falls and difficulty when she was readmitted last time then return to [facility] at that DC   OT comments  Pt making progress with functional goals ad should continue with acute OT services to increase level of function and safety. Pt requires multimodal cues to maintain back precautions during bed mobility and ADLs. Pt required increased time to complete ADLs/fcuntional tasks this session with mod rest breaks  Follow Up Recommendations  SNF;Supervision/Assistance - 24 hour    Equipment Recommendations  None recommended by OT    Recommendations for Other Services      Precautions / Restrictions Precautions Precautions: Fall;Back Precaution Comments: pt able to verbalize 3/3 back precautions, max multimodal cues required to maintain during bed mobility. Restrictions Weight Bearing Restrictions: No       Mobility Bed Mobility Overal bed mobility: Needs Assistance Bed Mobility: Sit to Supine;Rolling;Supine to Sit;Sidelying to Sit;Sit to Sidelying Rolling: Min assist Sidelying to sit: Min guard   Sit to supine: Min assist Sit to sidelying: Min assist General bed mobility comments: needs assist to bring legs up onto bed when returning to supine.  Ablet to bend legs up assist wtih pushing toward head of bed  Transfers Overall transfer level: Needs assistance Equipment used: Rolling  walker (2 wheeled) Transfers: Sit to/from Stand Sit to Stand: From elevated surface;Mod assist Stand pivot transfers: Min assist       General transfer comment: pt continues to have difficulty coming to standing from sitting position. Pt able to stand for grooming and clothing mgt/hgyiene for toileting tasks at [facility]                                       ADL   Grooming: Wash/dry hands;Wash/dry face;Standing;Minimal assistance     Lower Body Bathing: Minimal assistance;Moderate assistance;Sitting/lateral leans;Sit to/from stand   Toilet Transfer: Minimal assistance;RW;BSC Toileting- Water quality scientist and Hygiene: Moderate assistance;Sit to/from stand;Sitting/lateral lean;Cueing for back precautions     General ADL Comments: Pt required increaed time to complete toileting tasks, required clean up as BM started as pt sat EOB                                      Cognition   Behavior During Therapy: [facility] for tasks assessed/performed Overall Cognitive Status: Within Functional Limits for tasks assessed                                            General Comments  Pt able to stand at [facility] for  toileting/hyiene, grooming tasks. Pt sat EOB x 18 minutes for ADLs and functional tasks    Pertinent Vitals/ Pain       No c/o pain  Frequency Min 2X/week     Progress Toward Goals  OT Goals(current goals can now be found in the care plan section)  Progress towards OT goals: Progressing toward goals     Plan Discharge plan remains appropriate    End of Session Equipment Utilized During Treatment: Rolling walker;Gait belt;Other (comment) (BSC)  Activity Tolerance Patient tolerated treatment well   Patient Left in bed;with call bell/phone within reach;with bed alarm set             Time: 0569-7948 OT Time Calculation (min): 39 min  Charges: OT  General Charges $OT Visit: 1 Procedure OT Treatments $Self Care/Home Management : 23-37 mins $Therapeutic Activity: 8-22 mins  [name] 12/03/2013, 12:42 PM

## 2013-12-03 NOTE — Progress Notes (Signed)
CRITICAL VALUE ALERT  Critical value received:  Results for EILAH, COMMON (MRN [id number]) as of 12/03/2013 06:38  Ref. Range 12/03/2013 05:34  Potassium Latest Range: 3.5-5.1 mEq/L 2.9 (LL)    Date of notification:    Time of notification:    Critical value read back:yes  Nurse who received alert: Viviano Simas   MD notified (1st page):  K Schorr  Time of first page:  6:40 AM   MD notified (2nd page):  Time of second page:  Responding MD:  Lamar Blinks  Time MD responded:  See order

## 2013-12-03 NOTE — Progress Notes (Addendum)
ANTICOAGULATION CONSULT NOTE - Follow Up Consult  Pharmacy Consult for heparin and coumadin Indication: DVT  Allergies  Allergen Reactions  . Aspirin Rash    Patient Measurements: Height: 5\' 6"  (167.6 cm) Weight: 269 lb 12.8 oz (122.38 kg) IBW/kg (Calculated) : 59.3 Heparin Dosing Weight: 89 kg  Vital Signs: Temp: 99.5 F (37.5 C) (03/26 0434) Temp src: Oral (03/26 0434) BP: 116/65 mmHg (03/26 0434) Pulse Rate: 86 (03/26 0434)  Labs:  Recent Labs  12/01/13 0610 12/02/13 0432 12/03/13 0534  HGB 7.8* 7.3* 8.0*  HCT 23.7* 22.4* 24.0*  PLT 289 312 324  LABPROT  --  18.8* 21.8*  INR  --  1.62* 1.97*  HEPARINUNFRC 0.50 0.41 0.26*  CREATININE 0.96 0.98 0.95    Estimated Creatinine Clearance: 81.9 ml/min (by C-G formula based on Cr of 0.95).  Assessment: Patient is a 63 y.o F on anticoagulation overlap day #3 of 5 days minimum for new DVT.  INR is slightly subtherapeutic at 1.97 and heparin level is below goal at 0.26.  No bleeding documented.  Goal of Therapy:  INR 2-3 Heparin level 0.3-0.7 units/ml Monitor platelets by anticoagulation protocol: Yes   Plan:  1) increase heparin drip to 1150 units/hr 2) check 6 hour heparin level 3) coumadin 5mg  PO x1 today  Jolyn Deshmukh P 12/03/2013,9:40 AM  Adden: Heparin level remains subtherapeutic at 0.18 after rate increased to 1150 units/hr. Will increase rate to 1350 units/hr and recheck another 6 hr heparin level.

## 2013-12-03 NOTE — Discharge Instructions (Signed)
Information on my medicine - Coumadin   (Warfarin)  This medication education was reviewed with me or my healthcare representative as part of my discharge preparation.  The pharmacist that spoke with me during my hospital stay was:  Lynelle Doctor, [facility]  Why was Coumadin prescribed for you? Coumadin was prescribed for you because you have a blood clot or a medical condition that can cause an increased risk of forming blood clots. Blood clots can cause serious health problems by blocking the flow of blood to the heart, lung, or brain. Coumadin can prevent harmful blood clots from forming. As a reminder your indication for Coumadin is:   Deep Vein Thrombosis Treatment  What test will check on my response to Coumadin? While on Coumadin (warfarin) you will need to have an INR test regularly to ensure that your dose is keeping you in the desired range. The INR (international normalized ratio) number is calculated from the result of the laboratory test called prothrombin time (PT).  If an INR APPOINTMENT HAS NOT ALREADY BEEN MADE FOR YOU please schedule an appointment to have this lab work done by your health care provider within 7 days. Your INR goal is usually a number between:  2 to 3 or your provider may give you a more narrow range like 2-2.5.  Ask your health care provider during an office visit what your goal INR is.  What  do you need to  know  About  COUMADIN? Take Coumadin (warfarin) exactly as prescribed by your healthcare provider about the same time each day.  DO NOT stop taking without talking to the doctor who prescribed the medication.  Stopping without other blood clot prevention medication to take the place of Coumadin may increase your risk of developing a new clot or stroke.  Get refills before you run out.  What do you do if you miss a dose? If you miss a dose, take it as soon as you remember on the same day then continue your regularly scheduled regimen the next day.  Do not take two  doses of Coumadin at the same time.  Important Safety Information A possible side effect of Coumadin (Warfarin) is an increased risk of bleeding. You should call your healthcare provider right away if you experience any of the following:   Bleeding from an injury or your nose that does not stop.   Unusual colored urine (red or dark brown) or unusual colored stools (red or black).   Unusual bruising for unknown reasons.   A serious fall or if you hit your head (even if there is no bleeding).  Some foods or medicines interact with Coumadin (warfarin) and might alter your response to warfarin. To help avoid this:   Eat a balanced diet, maintaining a consistent amount of Vitamin K.   Notify your provider about major diet changes you plan to make.   Avoid alcohol or limit your intake to 1 drink for women and 2 drinks for men per day. (1 drink is 5 oz. wine, 12 oz. beer, or 1.5 oz. liquor.)  Make sure that ANY health care provider who prescribes medication for you knows that you are taking Coumadin (warfarin).  Also make sure the healthcare provider who is monitoring your Coumadin knows when you have started a new medication including herbals and non-prescription products.  Coumadin (Warfarin)  Major Drug Interactions  Increased Warfarin Effect Decreased Warfarin Effect  Alcohol (large quantities) Antibiotics (esp. Septra/Bactrim, Flagyl, Cipro) Amiodarone (Cordarone) Aspirin (ASA) Cimetidine (  Tagamet) Megestrol (Megace) NSAIDs (ibuprofen, naproxen, etc.) Piroxicam (Feldene) Propafenone (Rythmol SR) Propranolol (Inderal) Isoniazid (INH) Posaconazole (Noxafil) Barbiturates (Phenobarbital) Carbamazepine (Tegretol) Chlordiazepoxide (Librium) Cholestyramine (Questran) Griseofulvin Oral Contraceptives Rifampin Sucralfate (Carafate) Vitamin K   Coumadin (Warfarin) Major Herbal Interactions  Increased Warfarin Effect Decreased Warfarin Effect  Garlic Ginseng Ginkgo biloba Coenzyme  Q10 Green tea St. Johns wort    Coumadin (Warfarin) FOOD Interactions  Eat a consistent number of servings per week of foods HIGH in Vitamin K (1 serving =  cup)  Collards (cooked, or boiled & drained) Kale (cooked, or boiled & drained) Mustard greens (cooked, or boiled & drained) Parsley *serving size only =  cup Spinach (cooked, or boiled & drained) Swiss chard (cooked, or boiled & drained) Turnip greens (cooked, or boiled & drained)  Eat a consistent number of servings per week of foods MEDIUM-HIGH in Vitamin K (1 serving = 1 cup)  Asparagus (cooked, or boiled & drained) Broccoli (cooked, boiled & drained, or raw & chopped) Brussel sprouts (cooked, or boiled & drained) *serving size only =  cup Lettuce, raw (green leaf, endive, romaine) Spinach, raw Turnip greens, raw & chopped   These websites have more information on Coumadin (warfarin):  FailFactory.se; VeganReport.com.au;

## 2013-12-03 NOTE — Progress Notes (Signed)
NUTRITION FOLLOW UP  Pt meets criteria for severe MALNUTRITION in the context of acute illness as evidenced by intake of <50% x at least 5 days and moderate muscle wasting.  Intervention:    Continue Vital AF 1.2 @  goal rate of 60 ml/hr to provide 1728 kcals, 108 grams of protein, and 1168 ml of free water.   Pt at risk for refeeding syndrome, continue to monitor potassium, phosphorous and magnesium for another  2 days.    Per radiology notes, tip of feeding tube is near ligament of Treitz; discussed with RN that if this is still true, gastric residuals do not need to be checked, however pt should still be monitored for any signs of abdominal discomfort, intolerance or signs/symptoms aspiration.   Consider J-tube if permanent tube feeding is desired given gastroparesis.   Will continue to follow nutrition care plan.  Nutrition Dx:   Inadequate oral intake now related to GI distress AEB inability to tolerate PO's. Ongoing.  Goal:   Pt to meet >/= 90% of their estimated nutrition needs; currently unmet, will be met by end of day  Monitor:   PO intake, weight trends, labs, I/O's  Assessment:   63 year old obese female with history of HTN, GERD, depression, obesity and lumbar spondylosis status post lumbar decompression surgery in January 2015. Just discharged 11/04/13 after treatment for c.diff colitis and salmonella enterocolitis. Hospitalization complicated by encephalopathy and acute renal failure (peak creatinine 3.03). Admitted 2/25 with respiratory failure thought due to combination PNA and HF.   Patient was extubated on 3/5. BSE on 3/6 with recommendations for Dysphagia 1 with Nectar Thick liquids. Pt has been on and off of a full liquid diet since 3/6. Pt underwent endoscopy 3/11. Findings consistent with severe reflux esophagitis. Pt with likely gastroparesis. CT scan of abdomen worrisome for rectal mass and colitis. Pt underwent NGT placement per RN on 3/13 for low suction to  prevent continuous reflux and possible aspiration.   Colonoscopy on 3/15 with findings of polyps, mild diverticulosis, and cecal ulcerations.   RD consulted to initiation enteral nutrition on 3/15, feedings were initiated, however MD discontinued enteral nutrition later that afternoon; per RN, pt was given 30 ml of Osmolite 1.2, tube was clamped, and pt had 40 ml of residual 1 hour later.    3/18-Postpyloric feeding tube placement on 3/16, per notes - tip of feeding tube near ligament of Treitz in good position.   3/20-TF d/c for clear liquid diet trial.  3/24-Pt reports not having an appetite. Per RN, pt only had a few sips of her clear liquid diet this morning. Pt will be started on a soft diet for dinner. Pt is willing to try Ensure--will order. Will monitor pt tolerance with the soft diet. Per RN, if soft diet is not tolerated--will resume tube feeding. Pt with ongoing diarrhea. C. Diff negative. Pt denies any stomach pains, nausea, or difficulties chewing.  A calorie count for 48 hours will be initiated to assess calorie and protein intake to access if pt will need to continue nutrition support (enteral tube feeding) or if PO intake will appropriately meet estimated nutrition needs. Pt has NGT still placed, but is clamped. Spoke with case manager about nutrition goals/interventions. Will continue to monitor.  3/25- Calorie count was discontinued due to pt not tolerating diet. Pt was only able to take a few bites of her food and reports it would get stuck in her throat or she would choke. Difficulty swallowing is likely due  to ulcerations in her esophagus. Pt reports not having an appetite.Tube feeding was resumed, but since pt with ongoing diarrhea--will switch tube feeding formula to Vital AF 1.2 for easier digestion. Consider j-tube if permanent tube feeding is desired due to pt gastroparesis. Noted pt diet is still soft diet--will change to NPO (given verbal consent from Dr. Sherral Hammers). Will  continue to follow pt and management pt tube feedings.  3/26- Per RN, pt has been tolerating the tube feeding. Current tube feeding of Vital AF 1.2 running at 40 ml/hr (this morning) to provide 1152 kcals (64% of estimated minimum kcal needs), 72 grams of protein (72% of estimated minimum protein needs), and 779 ml of free water. Tube feeding will be slowly increased to goal rate of 60 ml/hr. Spoke with Dr. Sherral Hammers about j-tube vs. PEG placement. Pt reports she will need to speak with family before final decisions are made on permanent tube feeding. Pt with ongoing diarrhea.  Labs: low potassium (requiring ongoing repletion), calcium, total protein, albumin, and total bilirubin. High glucose  Magnesium and phosphorous WNL.  Height: Ht Readings from Last 1 Encounters:  12/01/13 5' 6" (1.676 m)    Weight Status:   Wt Readings from Last 1 Encounters:  12/02/13 269 lb 12.8 oz (122.38 kg)  Admit wt(12/08/2013)250 lb  Re-estimated needs:  Kcal: 1800 - 2100  Protein: 100 - 110  Fluid: 1.8 - 2 L/day  Skin: Stage II to upper medial sacrum  Stage II to mid medial sacrum  Stage II to lower medial sacrum  Diet Order: NPO   Intake/Output Summary (Last 24 hours) at 12/03/13 1609 Last data filed at 12/03/13 0640  Gross per 24 hour  Intake   1892 ml  Output   2050 ml  Net   -158 ml    Last BM: 3/26-loose (pt with ongoing diarrhea)   Labs:   Recent Labs Lab 11/27/13 0500  12/01/13 0610 12/02/13 0432 12/03/13 0534  NA 142  < > 143 140 140  K 3.5*  < > 3.1* 2.8* 2.9*  CL 106  < > 106 103 105  CO2 29  < > _0 BUN 8  < > 7 7 5*  CREATININE 0.94  < > 0.96 0.98 0.95  CALCIUM 7.6*  < > 7.9* 7.6* 7.8*  MG 1.5  < > 1.2* 1.1* 1.8  PHOS 2.7  --   --   --  3.5  GLUCOSE 112*  < > 73 75 117*  < > = values in this interval not displayed.  CBG (last 3)   Recent Labs  12/03/13 0432 12/03/13 0813 12/03/13 1153  GLUCAP 126* 110* 118*    Scheduled Meds: . buPROPion  75 mg Per  Tube BID  . chlorpheniramine-HYDROcodone  5 mL Per Tube Q12H  . escitalopram  20 mg Oral Daily  . guaiFENesin  15 mL Per Tube 4 times per day  . HYDROcodone-acetaminophen  2 tablet Oral Once  . lamoTRIgine  200 mg Oral Daily  . levothyroxine  88 mcg Oral QAC breakfast  . magnesium oxide  400 mg Per Tube BID  . magnesium oxide  400 mg Per Tube Once  . metoprolol tartrate  12.5 mg Oral BID  . ondansetron (ZOFRAN) IV  4 mg Intravenous 4 times per day  . pantoprazole sodium  40 mg Per Tube BID  . QUEtiapine  100 mg Oral BID  . saccharomyces boulardii  250 mg Oral BID  . sucralfate  1  g Oral TID AC & HS  . warfarin  5 mg Oral ONCE-1800  . Warfarin - Pharmacist Dosing Inpatient   Does not apply q1800    Continuous Infusions: . dextrose 5 % and 0.45% NaCl 125 mL/hr at 12/03/13 0830  . feeding supplement (VITAL AF 1.2 CAL) 1,000 mL (12/03/13 0831)  . heparin 1,150 Units/hr (12/03/13 1022)    Kallie Locks Dietetic Intern Pager: [phone]  I agree with the above information and made appropriate revisions. Inda Coke MS, RD, LDN Inpatient Registered Dietitian Pager: [phone] After-hours pager: [phone]

## 2013-12-03 NOTE — Progress Notes (Signed)
[facility] PROGRESS NOTE  [name] [id number] DOB: [birth year] DOA: 12/01/2013 PCP: [name], MD  Assessment/Plan:  Acute respiratory failure:  -Secondary to aspiration pneumonia.  -clinically improved -Continue NG tube -3/17 patient  S/P fluoroscopic placement of a Dobhoff tube  -Respiratory failure has resolved since placement of Dobhoff tube  Pulmonary hypertension -3/17 BP slightly outside AHA guidelines continue metoprolol 12.5 mg BID -3/26 BP within AHA guidelines continue metoprolol 12.5 mg BID    Upper GI bleed: -Patient has significant pathology; esophagitis, duodenal ulcer, duodenitis. See EGD report below -Continue All medication to be administered via Dobhoff tube (carafate, PPI po bid, bid pepcid, florastor)  Left upper extremity DVT 3/20 - Patient is currently on heparin and I will bridge to Coumadin  - Coumadin per pharmacy consult   Aspiration Pneumonia:  -Patient made n.p.o. secondary to refractory N./V. and increased risk of aspirating. -Patient has been without nutrition since admission on 3/1 consult nutrition and pharmacy for NG tube feeding. -3/16 unable to continue NG tube feeding secondary to large residuals. Will need to place Dobhoff tube in the a.m Ensuring termination in the duodenum  -3/17 S/P IR placement of Dobhoff tube with termination in the duodenum -3/17 Start enteral feedings via Dobhoff, using enteral feeds for non- ICU patient protocol. -3/25 patient was tried on clear liquids and dysphagia diet and both have caused patient extreme episodes of N./V. DC any PO nutrition and restart feedings per Dobhoff tube. Dietitian to manage  -3/26 reconsulted [facility] to evaluate patient for placement of J-tube  Dysphagia:  -Patient has significant pathology; esophagitis, duodenal ulcer, duodenitis. See EGD report below -Patient has been without nutrition since admission on 3/1 consult nutrition and pharmacy for NG tube  feeding. -3/16 ASAP order placed for fluoroscopically guided placement of Dobhoff feeding to placement into the duodenum. -3./17 Dobhoff tube successfully place -3/25 patient has been unable to tolerate clear liquid diet; resulted in gagging/nausea/vomiting. Patient to be made n.p.o. and resume tube feedings Osmolite 1.2 CAL dietitian to manage -Discussed placement of PEG tube since patient has been tried numerous occasions PO feedings and failed will discuss with her family -3/26 reconsulted [facility] to evaluate patient for placement of J-tube  Depression  -continue Lexapro, Wellbutrin, via  NG tube   Hypothyroidism:  -Synthroid 88 mcg per NG tube    Hyperlipidemia:  -Stable.  -lipid panel within NCEP guidelines   Hypertension:  -BP not within AHA guidelines continue metoprolol 12.5 mg BID    C. difficile colitis:  -Continue Flagyl+ PO vancomycin. Will Consider 3/13 as first full day of treatment (3/13 NG tube placed and patient able to keep all medication down) -CT scan abdomen and pelvis completed; significant pathology to include possible neoplasm see results below.  -Dr. Delfin Edis (GI) conducted Colonoscopy on 3/15, which showed significant pathology see report below, awaiting pathology reports -Please contact daughter [name]  [phone] with results of pathology report -3/26 review of patient's MAR shows that metronidazole and vancomycin stopped on 3/21, on 3/23 patient began to have watery diarrhea again. -3/26 C. difficile by PCR  Hx psychosis: -Likely being off of Seroquel, Lamictal and Lexapro for almost one week may be contributing factor to decreased me ntation and worsening swallowing. Continue via Dobhoff tube  Hypernatremia:  - Resolved -3/25 restart D5 1/2 normal saline 125 ml/hr, until patient tolerating enteral feeds via Dobhoff tube. -Check CMP in the a.m.   Hypokalemia  -3/14 replace k 6 runs secondary to refractory N./V., and  patient having bowel prep  for colonoscopy this evening   -3/15 replace potassium 10 meq x6 runs secondary to loss through NG tube/bowel preparation for her colonoscopy -3/16 replace potassium 10 meq x 5 runs -3/17 replace potassium 54meq x6 runs -3/25 replace potassium 103meq x 5 runs -3/26 replace potassium 52meq x 5 runs -Recheck in the a.m. . If continues to be problem reconsult nutrition/pharmacy to add potassium to enteral feeds   Hypomagnesemia:  -3/16  replete magnesium 3 gm IV  -3/25 replete magnesium 4 gm IV -3/26 replete magnesium 400 mg x1 via Dobhoff -3/26 continue magnesium oxide via Dobhoff BID  Nausea vomiting  -NG tube placed 3/13 -Most likely secondary to gastroparesis + patient significant pathology i.e. sophagitis/duodenitis/duodenal ulcer.  -Continue scheduled Zofran  -Thorazine 25 mg BID; after patient tolerating enteral feeds consider changing to PRN/DC -3/25 DC Reglan 5 mg QID secondary to watery diarrhea starting 3/23       Code Status: Full code  Family Communication: None  Disposition Plan: Resolution of refractory N./V.; Physical therapy recommending skilled nursing, however patient may need LTAC placement given her possible PEG placement     Consultants: Dr. Delfin Edis (GI)   Procedures: Left upper extremity venous duplex on 11/27/2013 Consistent with acute deep and superficialvein thrombosis involving the left subclavian vein, left axillary vein, left brachial vein, and left cephalic vein.   Colonoscopy with cold biopsy polypectomy and Colonoscopy with biopsy 11/13/2013 -Two sessile polyps ranging between 3-58mm in size were found in the descending colon; polypectomy was performed with cold forceps  -There was mild diverticulosis noted in the sigmoid colon  - .diffuse nonspecific colitis throughout the lef,t transverse and right colon consistent with resolving colitis, status post random biopsies ,no evidence of pseudomembrane  - Cecal ulcerations likely resolving colitis.  Biopsies taken to rule out ischemic etiology versus pseudomembranous colitis    Abdomen pelvis with contrast 11/20/2013 -Interval progression of now diffuse colonic wall thickening, surrounding stranding, and pericolonic fluid tracking to the pelvis. Findings are most suggestive of infectious or inflammatory colitis  such as may be seen with Clostridium difficile infection, ischemia, or less likely inflammatory bowel disease. No free air to suggest perforation.  -A polypoid intraluminal masslike filling defect is noted level of the rectum which could represent malignancy such as colon cancer, although oval inpouching related to wall thickening could  appear similar. After the patient's current symptoms resolve, colonoscopy or sigmoidoscopy is recommended for further evaluation at direct visualization.  -New moderate right pleural effusion with right greater than left lower lobe atelectasis.  -New amorphous hyperdensity within the right lower lobe, which could indicate aspiration.   EGD 11/12/2013 -severe grade 4 reflux esophagitis status post biopsies  -Mild gastric retention  -Small duodenal ulcer without stigmata of bleeding  -Moderately severe duodenitis status post biopsies to rule out H. Pylori   Left PICC/Midline 3/13>> stopped 3/20  NG tube 3/13>>>  Intubation 3/2-3/5   NG tube 3/2-3/5   Right IJ central line 3/1-3/6  Echocardiogram 11/09/2013 - Left ventricle: The cavity size was normal. mild LVH.  -LVEF; 55% to 60%. - Atrial septum: No defect or patent foramen ovale was identified. - Pulmonary arteries: PA peak pressure: 38mm Hg (S).    Antibiotics: IV Zosyn 3/1-stopped 3/10 IV Flagyl 3/7>> stopped 3/21 PO vancomycin 3/7>> stopped 3/21   HPI/Subjective: 62yo BF PMHx  hypertension, hypothyroidism and psychosis who was discharged on 2/25 after hospitalization for C. difficile colitis and we admitted on 3/1 for acute respiratory  failure secondary to pneumonia. She was  admitted to the critical care service and intubated. Echocardiogram checked and found to be unremarkable. Patient started on broad-spectrum antibiotics. By 3/5, she was able to be extubated and transferred to the hospitalist service in the step down unit starting 3/7. Trace pitting by speech therapy who initially recommended downgrading her diet to dysphagia with nectar thick liquids, however followup evaluation in 3/7 noted further downgraded to n.p.o. with meds only. Patient has been getting slightly more concentrated with increase in her sodium which was 150 by 3/7 and started on gentle half normal saline. C. difficile cultures repeated and patient still positive. Still having some diarrhea. 3/11 patient states can not keep her vancomycin down. However talking to her RN when patient does not know she is taking vancomycin able to keep medication down. Currently n.p.o. scheduled for EGD today. 3/12 patient obtain EGD yesterday which showed significant pathology see report below. 3/13 patient feeling improved, negative N./V. since placement of NG tube 3/14 patient with only mild episodes of nausea, states she understands the plan outlined by Dr. Olevia Perches (GI) concerning the need for colonoscopy 3/15 patient states her N./V. has resolved the NG tube placement. Patient has tolerated contrast for CT scan, medications, and bowel prep for colonoscopy. 3/17 patient sleepy but arousable S/P placement of Dobhoff tube. Request that daughter [name]  [phone] be brought up-to-date on plan of care 3/25 patient verified that she has been unable to handle any PO nutrition without consistently having severe N./V. 3/26 patient states spoke with family about needing to have a J-tube placed. I again reassured her that this would be most likely a temporary measure, and patient agreed to allow me to reconsult GI   Objective: Filed Vitals:   12/02/13 1634 12/02/13 2048 12/03/13 0434 12/03/13 1000  BP: 126/63 133/66 116/65 125/69   Pulse: 84 86 86 88  Temp: 98.9 F (37.2 C) 98.9 F (37.2 C) 99.5 F (37.5 C) 98.2 F (36.8 C)  TempSrc:  Oral Oral Oral  Resp: 18 18 20 20   Height:      Weight:  122.38 kg (269 lb 12.8 oz)    SpO2: 95% 96% 97% 98%    Intake/Output Summary (Last 24 hours) at 12/03/13 1511 Last data filed at 12/03/13 0640  Gross per 24 hour  Intake   1892 ml  Output   2050 ml  Net   -158 ml   Filed Weights   11/30/13 2025 12/01/13 2311 12/02/13 2048  Weight: 122.7 kg (270 lb 8.1 oz) 122.335 kg (269 lb 11.2 oz) 122.38 kg (269 lb 12.8 oz)    Exam:   General:  A./O. x4, NAD     Cardiovascular: Regular rhythm and rate,, negative murmurs rubs gallops  Respiratory: Clear to auscultation bilateral  Abdomen: Obese, mild right lower quadrant tenderness to palpation, plus bowel sound (hypoactive)  Musculoskeletal: Positive 1+plus pedal edema bilateral to knee   Data Reviewed: Basic Metabolic Panel:  Recent Labs Lab 11/27/13 0500  11/29/13 0105 11/30/13 0553 12/01/13 0610 12/02/13 0432 12/03/13 0534  NA 142  < > 141 142 143 140 140  K 3.5*  < > 3.2* 3.5* 3.1* 2.8* 2.9*  CL 106  < > 104 105 106 103 105  CO2 29  < > 27 26 26 23 24   GLUCOSE 112*  < > 118* 74 73 75 117*  BUN 8  < > 10 9 7 7  5*  CREATININE 0.94  < > 0.91 0.95 0.96 0.98  0.95  CALCIUM 7.6*  < > 7.8* 7.9* 7.9* 7.6* 7.8*  MG 1.5  < > 1.3* 1.2* 1.2* 1.1* 1.8  PHOS 2.7  --   --   --   --   --  3.5  < > = values in this interval not displayed. Liver Function Tests:  Recent Labs Lab 11/29/13 0105 11/30/13 0553 12/01/13 0610 12/02/13 0432 12/03/13 0534  AST 14 14 14 13 13   ALT 6 6 6 6 7   ALKPHOS 73 61 55 49 59  BILITOT <0.2* 0.2* 0.2* <0.2* <0.2*  PROT 4.7* 5.0* 4.9* 4.8* 5.2*  ALBUMIN 1.6* 1.8* 1.8* 1.7* 1.9*   No results found for this basename: LIPASE, AMYLASE,  in the last 168 hours No results found for this basename: AMMONIA,  in the last 168 hours CBC:  Recent Labs Lab 11/29/13 0105 11/30/13 0553  12/01/13 0610 12/02/13 0432 12/03/13 0534  WBC 6.4 6.5 6.1 5.5 5.6  NEUTROABS 3.6 3.2 3.1 2.3 2.9  HGB 7.9* 8.3* 7.8* 7.3* 8.0*  HCT 23.7* 24.4* 23.7* 22.4* 24.0*  MCV 89.1 88.7 89.1 91.4 90.9  PLT 251 273 289 312 324   Cardiac Enzymes: No results found for this basename: CKTOTAL, CKMB, CKMBINDEX, TROPONINI,  in the last 168 hours BNP (last 3 results)  Recent Labs  12/06/2013 1512  PROBNP 1572.0*   CBG:  Recent Labs Lab 12/02/13 2046 12/03/13 0009 12/03/13 0432 12/03/13 0813 12/03/13 1153  GLUCAP 108* 109* 126* 110* 118*    Recent Results (from the past 240 hour(s))  CLOSTRIDIUM DIFFICILE BY PCR     Status: None   Collection Time    11/27/13 10:46 AM      Result Value Ref Range Status   C difficile by pcr NEGATIVE  NEGATIVE Final     Studies: No results found.  Scheduled Meds: . buPROPion  75 mg Per Tube BID  . chlorpheniramine-HYDROcodone  5 mL Per Tube Q12H  . escitalopram  20 mg Oral Daily  . guaiFENesin  15 mL Per Tube 4 times per day  . HYDROcodone-acetaminophen  2 tablet Oral Once  . lamoTRIgine  200 mg Oral Daily  . levothyroxine  88 mcg Oral QAC breakfast  . magnesium oxide  400 mg Per Tube BID  . metoprolol tartrate  12.5 mg Oral BID  . ondansetron (ZOFRAN) IV  4 mg Intravenous 4 times per day  . pantoprazole sodium  40 mg Per Tube BID  . QUEtiapine  100 mg Oral BID  . saccharomyces boulardii  250 mg Oral BID  . sucralfate  1 g Oral TID AC & HS  . warfarin  5 mg Oral ONCE-1800  . Warfarin - Pharmacist Dosing Inpatient   Does not apply q1800   Continuous Infusions: . dextrose 5 % and 0.45% NaCl 125 mL/hr at 12/03/13 0830  . feeding supplement (VITAL AF 1.2 CAL) 1,000 mL (12/03/13 0831)  . heparin 1,150 Units/hr (12/03/13 1022)    Principal Problem:   Aspiration pneumonia Active Problems:   Severe Clostridium difficile enterocolitis   Acute respiratory failure   Hematemesis   Dysphagia, pharyngoesophageal phase   H/O psychosis    Pulmonary hypertension   Duodenal ulcer, acute   Reflux esophagitis   Other and unspecified noninfectious gastroenteritis and colitis(558.9)   Ulceration of intestine   Benign neoplasm of colon   DVT (deep venous thrombosis)    Time spent: 40 minute   Addisson Frate, [place], J  [facility] Pager [phone]. If 7PM-7AM, please contact  night-coverage at [email or web address], password [facility] 12/03/2013, 3:11 PM  LOS: 25 days

## 2013-12-04 DIAGNOSIS — R269 Unspecified abnormalities of gait and mobility: Secondary | ICD-10-CM

## 2013-12-04 LAB — HEPARIN LEVEL (UNFRACTIONATED): Heparin Unfractionated: 0.36 IU/mL (ref 0.30–0.70)

## 2013-12-04 LAB — COMPREHENSIVE METABOLIC PANEL
ALT: 6 U/L (ref 0–35)
AST: 13 U/L (ref 0–37)
Albumin: 1.9 g/dL — ABNORMAL LOW (ref 3.5–5.2)
Alkaline Phosphatase: 68 U/L (ref 39–117)
BUN: 3 mg/dL — AB (ref 6–23)
CALCIUM: 8 mg/dL — AB (ref 8.4–10.5)
CO2: 28 meq/L (ref 19–32)
Chloride: 107 mEq/L (ref 96–112)
Creatinine, Ser: 0.92 mg/dL (ref 0.50–1.10)
GFR, EST AFRICAN AMERICAN: 76 mL/min — AB (ref >90)
GFR, EST NON AFRICAN AMERICAN: 65 mL/min — AB (ref >90)
GLUCOSE: 95 mg/dL (ref 70–99)
Potassium: 3.3 mEq/L — ABNORMAL LOW (ref 3.7–5.3)
Sodium: 144 mEq/L (ref 137–147)
Total Bilirubin: <0.2 mg/dL — ABNORMAL LOW (ref 0.3–1.2)
Total Protein: 5.3 g/dL — ABNORMAL LOW (ref 6.0–8.3)

## 2013-12-04 LAB — GLUCOSE, CAPILLARY
GLUCOSE-CAPILLARY: 94 mg/dL (ref 70–99)
Glucose-Capillary: 106 mg/dL — ABNORMAL HIGH (ref 70–99)
Glucose-Capillary: 101 mg/dL — ABNORMAL HIGH (ref 70–99)
Glucose-Capillary: 115 mg/dL — ABNORMAL HIGH (ref 70–99)
Glucose-Capillary: 110 mg/dL — ABNORMAL HIGH (ref 70–99)
Glucose-Capillary: 106 mg/dL — ABNORMAL HIGH (ref 70–99)

## 2013-12-04 LAB — CBC WITH DIFFERENTIAL/PLATELET
BASOS ABS: 0 10*3/uL (ref 0.0–0.1)
Basophils Relative: 0 % (ref 0–1)
EOS ABS: 0.1 10*3/uL (ref 0.0–0.7)
Eosinophils Relative: 2 % (ref 0–5)
HCT: 23.6 % — ABNORMAL LOW (ref 36.0–46.0)
HEMOGLOBIN: 8 g/dL — AB (ref 12.0–15.0)
Lymphocytes Relative: 34 % (ref 12–46)
Lymphs Abs: 2 10*3/uL (ref 0.7–4.0)
MCH: 31 pg (ref 26.0–34.0)
MCHC: 33.9 g/dL (ref 30.0–36.0)
MCV: 91.5 fL (ref 78.0–100.0)
Monocytes Absolute: 0.7 10*3/uL (ref 0.1–1.0)
Monocytes Relative: 12 % (ref 3–12)
NEUTROS PCT: 52 % (ref 43–77)
Neutro Abs: 3.1 10*3/uL (ref 1.7–7.7)
Platelets: 342 10*3/uL (ref 150–400)
RBC: 2.58 MIL/uL — ABNORMAL LOW (ref 3.87–5.11)
RDW: 21.6 % — ABNORMAL HIGH (ref 11.5–15.5)
WBC: 5.9 10*3/uL (ref 4.0–10.5)

## 2013-12-04 LAB — PROTIME-INR
INR: 1.68 — ABNORMAL HIGH (ref 0.00–1.49)
Prothrombin Time: 19.3 seconds — ABNORMAL HIGH (ref 11.6–15.2)

## 2013-12-04 LAB — PHOSPHORUS: Phosphorus: 3.1 mg/dL (ref 2.3–4.6)

## 2013-12-04 MED ORDER — WARFARIN SODIUM 7.5 MG PO TABS
7.5000 mg | ORAL_TABLET | Freq: Once | ORAL | Status: AC
Start: 2013-12-04 — End: 2013-12-04
  Administered 2013-12-04: 7.5 mg via ORAL
  Filled 2013-12-04: qty 1

## 2013-12-04 MED ORDER — POTASSIUM CHLORIDE 10 MEQ/100ML IV SOLN
10.0000 meq | INTRAVENOUS | Status: AC
  Administered 2013-12-04 (×3): 10 meq via INTRAVENOUS
  Filled 2013-12-04 (×3): qty 100

## 2013-12-04 NOTE — Clinical Social Work Note (Signed)
CSW continuing to monitor patient's progress and will assist with d/c planning when appropriate. Patient from [facility] and should return there pending bed availability at time of discharge.  Prakash Kimberling Givens, MSW, LCSW [phone]

## 2013-12-04 NOTE — Progress Notes (Signed)
NG tube feeding increased rate from 50 cc to 60 cc at 0020.  Patient tolerating well.  Will continue to monitor.

## 2013-12-04 NOTE — Progress Notes (Signed)
Patient ID: [name], female   DOB: 03-12-1951, 63 y.o.   MRN: [id number] [facility] PROGRESS NOTE  [name] DPO:[id number] DOB: [birth year] DOA: 11/26/2013 PCP: [name], MD  Brief narrative: 63 yo female with hypertension, hypothyroidism and psychosis who was discharged on 2/25 after hospitalization for C. difficile colitis and we admitted on 3/1 for acute respiratory failure secondary to pneumonia. She was admitted to the critical care service and intubated and on 3/5 extubated and transferred to the hospitalist service.  EVENTS: 3/11 NPO, EGD --> report below  3/13 patient feeling improved, negative N./V. since placement of NG tube  3/14 patient with only mild episodes of nausea, states she understands the plan outlined by Dr. Olevia Perches (GI) concerning the need for colonoscopy  3/15 patient states her N./V. has resolved with NG tube placement. 3/26 patient states spoke with family about needing to have a J-tube placed  Assessment/Plan:  Acute respiratory failure:  - Secondary to aspiration pneumonia.  - clinically improved and maintaining oxygen saturation at target range  - Continue NG tube - 3/17 patient S/P fluoroscopic placement of a Dobhoff tube  - Respiratory failure has resolved since placement of Dobhoff tube  Pulmonary hypertension  - reasonable inpatient control - continue metoprolol 12.5 mg PO BID Upper GI bleed:  - Patient has significant pathology; esophagitis, duodenal ulcer, duodenitis. See EGD report below  - Continue all medications to be administered via Dobhoff tube (carafate, PPI po bid, bid pepcid, florastor)  - Hg and Hct stable over the past 24 hours  Hypokalemia - secondary to diarrhea - continue to supplement via IV  Left upper extremity DVT 3/20  - continue coumadin per pharmacy Aspiration Pneumonia:  - Patient made n.p.o. secondary to refractory N./V. and increased risk of aspirating.  - Patient has been without nutrition  since admission on 3/1 consult nutrition and pharmacy for NG tube feeding.  - 3 /16 unable to continue NG tube feeding secondary to large residuals.  - 3/17 S/P IR placement of Dobhoff tube and started enteral feeding  - 3/25 patient was tried on clear liquids and dysphagia diet and both have caused patient extreme episodes of N./V.  - 3/26 reconsulted [facility] to evaluate patient for placement of J-tube  Dysphagia:  - follow up on GI recommendations  Depression  - continue Lexapro, Wellbutrin, via NG tube  Hypothyroidism:  - Synthroid 88 mcg per NG tube  Hyperlipidemia:  - Stable.  Hypertension:  - reasonable inpatient control  C. difficile colitis:  - Continue Flagyl+ PO vancomycin. Will Consider 3/13 as first full day of treatment (3/13 NG tube placed and patient able to  - CT scan abdomen and pelvis completed; significant pathology to include possible neoplasm)  - 3/26 review of patient's MAR shows that metronidazole and vancomycin stopped on 3/21, on 3/23 patient began to have watery diarrhea again.  - 3/26 C. difficile by PCR  Hx psychosis:  - Likely being off of Seroquel, Lamictal and Lexapro for almost one week  - now resolved  Hypernatremia:  - Resolved  - Check CMP in the a.m.  Hypokalemia  - 3/14 replace k 6 runs secondary to refractory N./V., and patient having bowel prep for colonoscopy this evening  - 3/15 replace potassium 10 meq x6 runs secondary to loss through NG tube/bowel preparation for her colonoscopy  - 3/16 replace potassium 10 meq x 5 runs  - 3/17 replace potassium 72meq x 6 runs  - 3/25 replace potassium 57meq x  5 runs  - 3/26 replace potassium 69meq x 5 runs  - check in AM Hypomagnesemia:  - 3/16 replete magnesium 3 gm IV  - 3/25 replete magnesium 4 gm IV  - 3/26 replete magnesium 400 mg x1 via Dobhoff  - 3/26 continue magnesium oxide via Dobhoff BID  Nausea vomiting  - NG tube placed 3/13  - Most likely secondary to gastroparesis + patient  significant pathology i.e. sophagitis/duodenitis/duodenal ulcer.  - Continue scheduled Zofran  - Thorazine 25 mg BID; after patient tolerating enteral feeds consider changing to PRN/DC  - 3/25 DC Reglan 5 mg QID secondary to watery diarrhea starting 3/23  Moderate malnutrition - secondary to acute illness - tube feeding only for now   Code Status: Full code  Family Communication: None  Disposition Plan: remains inpatient   Consultants:  Dr. Delfin Edis (GI)  Procedures:   Left upper extremity venous duplex on 11/27/2013 Acute DVT and SVT involving the left subclavian vein, left axillary vein, left brachial vein, and left cephalic vein.  Colonoscopy with bx 2013-12-05 Two sessile polyps in descending colon s/p polypectomy, diffuse nonspecific colitis c/w resolving colitis, s/p random biopsies   Abdomen pelvis with contrast 11/20/2013  Infectious or inflammatory colitis such as C. Diff, filling defect in the rectum, ? Malignancy. Aspiration in the RLL  EGD 11/11/2013 severe grade 4 reflux esophagitis status post biopsies. Small duodenal ulcer without stigmata of bleeding. Moderately severe duodenitis status post biopsies  Left PICC/Midline 3/13>> stopped 3/20   NGT 3/13>>>   Intubation 3/2-3/5   NG tube 3/2-3/5   Right IJ central line 3/1-3/6  ECHO 11/09/2013 EF 55% Antibiotics:  IV Zosyn 3/1-stopped 3/10  IV Flagyl 3/7>> stopped 3/21  PO vancomycin 3/7>> stopped 3/21   Code Status: Full Family Communication: Pt at bedside Disposition Plan: Home when medically stable  HPI/Subjective: No events overnight.   Objective: Filed Vitals:   12/04/13 0418 12/04/13 0438 12/04/13 0500 12/04/13 0921  BP: 109/54 110/68  118/63  Pulse: 89   91  Temp: 99 F (37.2 C)   98.9 F (37.2 C)  TempSrc: Oral   Oral  Resp: 18   18  Height:      Weight:   121.655 kg (268 lb 3.2 oz)   SpO2: 98%   98%    Intake/Output Summary (Last 24 hours) at 12/04/13 1708 Last data filed at 12/04/13  1500  Gross per 24 hour  Intake 2796.25 ml  Output   1550 ml  Net 1246.25 ml    Exam:   General:  Pt is alert, follows commands appropriately, not in acute distress  Cardiovascular: Regular rate and rhythm, S1/S2, no murmurs, no rubs, no gallops  Respiratory: Clear to auscultation bilaterally, diminished breath sounds at bases   Abdomen: Soft, tender in epigastric area, non distended, bowel sounds present, no guarding  Extremities: No edema, pulses DP and PT palpable bilaterally  Neuro: Grossly nonfocal  Data Reviewed: Basic Metabolic Panel:  Recent Labs Lab 11/29/13 0105 11/30/13 0553 12/01/13 0610 12/02/13 0432 12/03/13 0534 12/04/13 0847  NA 141 142 143 140 140 144  K 3.2* 3.5* 3.1* 2.8* 2.9* 3.3*  CL 104 105 106 103 105 107  CO2 27 26 26 23 24 28   GLUCOSE 118* 74 73 75 117* 95  BUN 10 9 7 7  5* 3*  CREATININE 0.91 0.95 0.96 0.98 0.95 0.92  CALCIUM 7.8* 7.9* 7.9* 7.6* 7.8* 8.0*  MG 1.3* 1.2* 1.2* 1.1* 1.8  --   PHOS  --   --   --   --  3.5 3.1   Liver Function Tests:  Recent Labs Lab 11/30/13 0553 12/01/13 0610 12/02/13 0432 12/03/13 0534 12/04/13 0847  AST 14 14 13 13 13   ALT 6 6 6 7 6   ALKPHOS 61 55 49 59 68  BILITOT 0.2* 0.2* <0.2* <0.2* <0.2*  PROT 5.0* 4.9* 4.8* 5.2* 5.3*  ALBUMIN 1.8* 1.8* 1.7* 1.9* 1.9*   CBC:  Recent Labs Lab 11/30/13 0553 12/01/13 0610 12/02/13 0432 12/03/13 0534 12/04/13 0847  WBC 6.5 6.1 5.5 5.6 5.9  NEUTROABS 3.2 3.1 2.3 2.9 3.1  HGB 8.3* 7.8* 7.3* 8.0* 8.0*  HCT 24.4* 23.7* 22.4* 24.0* 23.6*  MCV 88.7 89.1 91.4 90.9 91.5  PLT 273 289 312 324 342   CBG:  Recent Labs Lab 12/04/13 0023 12/04/13 0414 12/04/13 0757 12/04/13 1128 12/04/13 1604  GLUCAP 106* 101* 115* 110* 106*    Recent Results (from the past 240 hour(s))  CLOSTRIDIUM DIFFICILE BY PCR     Status: None   Collection Time    11/27/13 10:46 AM      Result Value Ref Range Status   C difficile by pcr NEGATIVE  NEGATIVE Final      Scheduled Meds: . buPROPion  75 mg Per Tube BID  . chlorpheniramine-HYDROcodone  5 mL Per Tube Q12H  . escitalopram  20 mg Oral Daily  . guaiFENesin  15 mL Per Tube 4 times per day  . HYDROcodone-acetaminophen  2 tablet Oral Once  . lamoTRIgine  200 mg Oral Daily  . levothyroxine  88 mcg Oral QAC breakfast  . magnesium oxide  400 mg Per Tube BID  . metoprolol tartrate  12.5 mg Oral BID  . ondansetron (ZOFRAN) IV  4 mg Intravenous 4 times per day  . pantoprazole sodium  40 mg Per Tube BID  . QUEtiapine  100 mg Oral BID  . saccharomyces boulardii  250 mg Oral BID  . sucralfate  1 g Oral TID AC & HS  . warfarin  7.5 mg Oral ONCE-1800  . Warfarin - Pharmacist Dosing Inpatient   Does not apply q1800   Continuous Infusions: . dextrose 5 % and 0.45% NaCl 125 mL/hr at 12/03/13 0830  . feeding supplement (VITAL AF 1.2 CAL) 1,000 mL (12/04/13 0439)  . heparin 1,350 Units/hr (12/04/13 0006)   Faye Ramsay, MD  [facility] Pager [phone]  If 7PM-7AM, please contact night-coverage [email or web address] Password [email or web address] 12/04/2013, 5:08 PM   LOS: 26 days

## 2013-12-04 NOTE — Progress Notes (Signed)
ANTICOAGULATION CONSULT NOTE - Follow Up Consult  Pharmacy Consult for heparin and coumadin Indication: DVT  Allergies  Allergen Reactions  . Aspirin Rash    Patient Measurements: Height: 5\' 6"  (167.6 cm) Weight: 268 lb 3.2 oz (121.655 kg) IBW/kg (Calculated) : 59.3 Heparin Dosing Weight: 89 kg  Vital Signs: Temp: 99 F (37.2 C) (03/27 0418) Temp src: Oral (03/27 0418) BP: 110/68 mmHg (03/27 0438) Pulse Rate: 89 (03/27 0418)  Labs:  Recent Labs  12/02/13 0432 12/03/13 0534 12/03/13 1645 12/04/13 0847  HGB 7.3* 8.0*  --   --   HCT 22.4* 24.0*  --   --   PLT 312 324  --   --   LABPROT 18.8* 21.8*  --  19.3*  INR 1.62* 1.97*  --  1.68*  HEPARINUNFRC 0.41 0.26* 0.18* 0.36  CREATININE 0.98 0.95  --   --     Estimated Creatinine Clearance: 81.7 ml/min (by C-G formula based on Cr of 0.95).  Assessment: Patient is 63 y.o F on anticoagulation overlap day #4 of 5 days minimum for new DVT.  Heparin level is therapeutic this morning at 0.36.  INR decreased from 1.97 to 1.68 to day with dose given last night.  No bleeding documented.  Goal of Therapy:  INR 2-3 Heparin level 0.3-0.7 units/ml Monitor platelets by anticoagulation protocol: Yes   Plan:  1) continue heparin drip at 1350 units/hr 2) increase coumadin dose to 7.5mg  PO x1 today  Katherine Tout P 12/04/2013,9:14 AM

## 2013-12-05 LAB — COMPREHENSIVE METABOLIC PANEL
ALBUMIN: 1.7 g/dL — AB (ref 3.5–5.2)
ALK PHOS: 63 U/L (ref 39–117)
ALT: 6 U/L (ref 0–35)
AST: 12 U/L (ref 0–37)
BUN: 4 mg/dL — ABNORMAL LOW (ref 6–23)
CO2: 26 mEq/L (ref 19–32)
Calcium: 7.7 mg/dL — ABNORMAL LOW (ref 8.4–10.5)
Chloride: 106 mEq/L (ref 96–112)
Creatinine, Ser: 0.93 mg/dL (ref 0.50–1.10)
GFR calc Af Amer: 75 mL/min — ABNORMAL LOW (ref >90)
GFR calc non Af Amer: 65 mL/min — ABNORMAL LOW (ref >90)
Glucose, Bld: 101 mg/dL — ABNORMAL HIGH (ref 70–99)
POTASSIUM: 3.5 meq/L — AB (ref 3.7–5.3)
Sodium: 141 mEq/L (ref 137–147)
Total Bilirubin: <0.2 mg/dL — ABNORMAL LOW (ref 0.3–1.2)
Total Protein: 5 g/dL — ABNORMAL LOW (ref 6.0–8.3)

## 2013-12-05 LAB — CBC WITH DIFFERENTIAL/PLATELET
BASOS PCT: 0 % (ref 0–1)
Basophils Absolute: 0 10*3/uL (ref 0.0–0.1)
Eosinophils Absolute: 0.1 10*3/uL (ref 0.0–0.7)
Eosinophils Relative: 2 % (ref 0–5)
HEMATOCRIT: 23.1 % — AB (ref 36.0–46.0)
Hemoglobin: 7.8 g/dL — ABNORMAL LOW (ref 12.0–15.0)
LYMPHS ABS: 2.5 10*3/uL (ref 0.7–4.0)
Lymphocytes Relative: 41 % (ref 12–46)
MCH: 31 pg (ref 26.0–34.0)
MCHC: 33.8 g/dL (ref 30.0–36.0)
MCV: 91.7 fL (ref 78.0–100.0)
MONOS PCT: 16 % — AB (ref 3–12)
Monocytes Absolute: 1 10*3/uL (ref 0.1–1.0)
NEUTROS ABS: 2.4 10*3/uL (ref 1.7–7.7)
Neutrophils Relative %: 41 % — ABNORMAL LOW (ref 43–77)
Platelets: 322 10*3/uL (ref 150–400)
RBC: 2.52 MIL/uL — ABNORMAL LOW (ref 3.87–5.11)
RDW: 22.1 % — AB (ref 11.5–15.5)
WBC: 6 10*3/uL (ref 4.0–10.5)

## 2013-12-05 LAB — GLUCOSE, CAPILLARY
GLUCOSE-CAPILLARY: 100 mg/dL — AB (ref 70–99)
GLUCOSE-CAPILLARY: 93 mg/dL (ref 70–99)
Glucose-Capillary: 104 mg/dL — ABNORMAL HIGH (ref 70–99)
Glucose-Capillary: 114 mg/dL — ABNORMAL HIGH (ref 70–99)
Glucose-Capillary: 95 mg/dL (ref 70–99)
Glucose-Capillary: 99 mg/dL (ref 70–99)

## 2013-12-05 LAB — PROTIME-INR
INR: 1.72 — AB (ref 0.00–1.49)
Prothrombin Time: 19.7 seconds — ABNORMAL HIGH (ref 11.6–15.2)

## 2013-12-05 LAB — PHOSPHORUS: PHOSPHORUS: 2.9 mg/dL (ref 2.3–4.6)

## 2013-12-05 LAB — MAGNESIUM: MAGNESIUM: 1.4 mg/dL — AB (ref 1.5–2.5)

## 2013-12-05 LAB — HEPARIN LEVEL (UNFRACTIONATED): Heparin Unfractionated: 0.51 IU/mL (ref 0.30–0.70)

## 2013-12-05 MED ORDER — POTASSIUM CHLORIDE 10 MEQ/100ML IV SOLN
10.0000 meq | INTRAVENOUS | Status: AC
  Administered 2013-12-05 (×2): 10 meq via INTRAVENOUS
  Filled 2013-12-05 (×2): qty 100

## 2013-12-05 MED ORDER — WARFARIN SODIUM 7.5 MG PO TABS
7.5000 mg | ORAL_TABLET | Freq: Once | ORAL | Status: AC
  Administered 2013-12-05: 7.5 mg via ORAL
  Filled 2013-12-05: qty 1

## 2013-12-05 NOTE — Progress Notes (Signed)
ANTICOAGULATION CONSULT NOTE - Follow Up Consult  Pharmacy Consult for heparin and coumadin Indication: DVT  Allergies  Allergen Reactions  . Aspirin Rash    Patient Measurements: Height: 5\' 6"  (167.6 cm) Weight: 268 lb 8 oz (121.791 kg) IBW/kg (Calculated) : 59.3 Heparin Dosing Weight: 89 kg  Vital Signs: Temp: 97.2 F (36.2 C) (03/28 0509) Temp src: Oral (03/28 0509) BP: 133/77 mmHg (03/28 0509) Pulse Rate: 70 (03/28 0509)  Labs:  Recent Labs  12/03/13 0534 12/03/13 1645 12/04/13 0847 12/05/13 0530  HGB 8.0*  --  8.0* 7.8*  HCT 24.0*  --  23.6* 23.1*  PLT 324  --  342 322  LABPROT 21.8*  --  19.3* 19.7*  INR 1.97*  --  1.68* 1.72*  HEPARINUNFRC 0.26* 0.18* 0.36 0.51  CREATININE 0.95  --  0.92 0.93    Estimated Creatinine Clearance: 83.5 ml/min (by C-G formula based on Cr of 0.93).  Assessment: Patient is 63 y.o F on anticoagulation overlap day #5 of 5 days minimum for new DVT, HOWEVER will also need therapeutic INR x 24 hrs before heparin can be d/c'd.  Heparin level is therapeutic this morning at 0.51.  INR 1.72 today.  No bleeding documented.  Goal of Therapy:  INR 2-3 Heparin level 0.3-0.7 units/ml Monitor platelets by anticoagulation protocol: Yes   Plan:  1) Continue heparin drip at 1350 units/hr. 2) Repeat Coumadin 7.5mg  PO x1 today.  Uvaldo Rising, BCPS  Clinical Pharmacist Pager [phone]  12/05/2013 8:04 AM

## 2013-12-05 NOTE — Progress Notes (Signed)
Patient ID: [name], female   DOB: 10/07/1950, 63 y.o.   MRN: [id number] [facility] PROGRESS NOTE  [name] GMW:[id number] DOB: May 11, 1951 DOA: 12/03/2013 PCP: Sydney Dawson, MD  Brief narrative: 63 yo female with hypertension, hypothyroidism and psychosis who was discharged on 2/25 after hospitalization for C. difficile colitis and we admitted on 3/1 for acute respiratory failure secondary to pneumonia. She was admitted to the critical care service and intubated and on 3/5 extubated and transferred to the hospitalist service.   EVENTS:  3/11 NPO, EGD --> report below  3/13 patient feeling improved, negative N./V. since placement of NG tube  3/14 patient with only mild episodes of nausea, states she understands the plan outlined by Dr. Olevia Perches (GI) concerning the need for colonoscopy  3/15 patient states her N./V. has resolved with NG tube placement.  3/26 patient states spoke with family about needing to have a J-tube placed   Assessment/Plan:  Acute respiratory failure:  - Secondary to aspiration pneumonia.  - clinically improved and maintaining oxygen saturation at target range  - Continue NG tube, pt in agreement with PEG placement, will ask IR for assistance  - 3/17 patient S/P fluoroscopic placement of a Dobhoff tube  - Respiratory failure has resolved since placement of Dobhoff tube  Pulmonary hypertension  - reasonable inpatient control  - continue metoprolol 12.5 mg PO BID  Upper GI bleed:  - Patient has significant pathology; esophagitis, duodenal ulcer, duodenitis. See EGD report below  - Continue all medications to be administered via Dobhoff tube (carafate, PPI po bid, bid pepcid, florastor)  - Hg and Hct stable over the past 48 hours  Hypokalemia  - secondary to diarrhea  - continue to supplement via IV  Left upper extremity DVT 3/20  - continue coumadin per pharmacy  Aspiration Pneumonia:  - Patient made n.p.o. secondary to refractory N./V.  and increased risk of aspirating.  - Patient has been without nutrition since admission on 3/1 consult nutrition and pharmacy for NG tube feeding.  - 3 /16 unable to continue NG tube feeding secondary to large residuals.  - 3/17 S/P IR placement of Dobhoff tube and started enteral feeding  - 3/25 patient was tried on clear liquids and dysphagia diet and both have caused patient extreme episodes of N./V.  - 3/26 reconsulted [facility] to evaluate patient for placement of J-tube  Dysphagia:  - follow up on GI recommendations  - ask IR for assistance with PEG placement  Depression  - continue Lexapro, Wellbutrin, via NG tube  Hypothyroidism:  - Synthroid 88 mcg per NG tube  Hyperlipidemia:  - Stable.  Hypertension:  - reasonable inpatient control  C. difficile colitis:  - Continue Flagyl+ PO vancomycin. Consider 3/13 as first full day of tx (3/13 NG tube placed and patient able to take) - CT scan abdomen and pelvis completed; significant pathology to include possible neoplasm)  - 3/26 review of patient's MAR shows that flagyl and vanc stopped on 3/21, on 3/23 patient began to have watery diarrhea again.  - 3/26 C. difficile by PCR  Hx psychosis:  - Likely being off of Seroquel, Lamictal and Lexapro for almost one week  - now resolved  Hypernatremia:  - Resolved  - Check CMP in the a.m.  Hypokalemia  - 3/14 replace k 6 runs secondary to refractory N./V., and patient having bowel prep for colonoscopy this evening  - 3/15 replace potassium 10 meq x6 runs secondary to loss through NG tube/bowel preparation  for her colonoscopy  - 3/16 replace potassium 10 meq x 5 runs  - 3/17 replace potassium 23meq x 6 runs  - 3/25 replace potassium 61meq x 5 runs  - 3/26 replace potassium 45meq x 5 runs  - 3/27 replace potassium 37meq x 3 runs  - 3/28 replace potassium 32meq x 2 runs  - check in AM  Hypomagnesemia:  - 3/16 replete magnesium 3 gm IV  - 3/25 replete magnesium 4 gm IV  - 3/26 replete  magnesium 400 mg x1 via Dobhoff  - 3/26 continue magnesium oxide via Dobhoff BID  Nausea vomiting  - NG tube placed 3/13  - Most likely secondary to gastroparesis + patient significant pathology i.e. sophagitis/duodenitis/duodenal ulcer.  - Continue scheduled Zofran  - Thorazine 25 mg BID; after patient tolerating enteral feeds consider changing to PRN/DC  - 3/25 DC Reglan 5 mg QID secondary to watery diarrhea starting 3/23  Moderate malnutrition  - secondary to acute illness  - tube feeding only for now  - pt agreeable with PEG placement   Code Status: Full code  Family Communication: None  Disposition Plan: remains inpatient   Consultants:  Dr. Delfin Edis (GI)  Procedures:  Left upper extremity venous duplex on 11/27/2013 Acute DVT and SVT involving the left subclavian vein, left axillary vein, left brachial vein, and left cephalic vein.  Colonoscopy with bx 12/08/2013 Two sessile polyps in descending colon s/p polypectomy, diffuse nonspecific colitis c/w resolving colitis, s/p random biopsies  Abdomen pelvis with contrast 11/20/2013 Infectious or inflammatory colitis such as C. Diff, filling defect in the rectum, ? Malignancy. Aspiration in the RLL  EGD 11/23/2013 severe grade 4 reflux esophagitis status post biopsies. Small duodenal ulcer without stigmata of bleeding. Moderately severe duodenitis status post biopsies  Left PICC/Midline 3/13>> stopped 3/20  NGT 3/13>>>  Intubation 3/2-3/5  NG tube 3/2-3/5  Right IJ central line 3/1-3/6  ECHO 11/09/2013 EF 55% Antibiotics:  IV Zosyn 3/1-stopped 3/10  IV Flagyl 3/7>> stopped 3/21  PO vancomycin 3/7>> stopped 3/21   HPI/Subjective: No events overnight.   Objective: Filed Vitals:   12/05/13 0509 12/05/13 0952 12/05/13 1501 12/05/13 1732  BP: 133/77 105/67 115/72 114/63  Pulse: 70 90 92 93  Temp: 97.2 F (36.2 C) 98 F (36.7 C) 98 F (36.7 C) 98.4 F (36.9 C)  TempSrc: Oral Oral Oral Oral  Resp: 18 18 18 18   Height:       Weight:      SpO2: 92% 96% 98% 98%    Intake/Output Summary (Last 24 hours) at 12/05/13 1908 Last data filed at 12/05/13 1740  Gross per 24 hour  Intake 7672.03 ml  Output   2750 ml  Net 4922.03 ml    Exam:   General:  Pt is alert, follows commands appropriately, not in acute distress  Cardiovascular: Regular rate and rhythm, S1/S2, no murmurs, no rubs, no gallops  Respiratory: Clear to auscultation bilaterally, no wheezing, no crackles, no rhonchi  Abdomen: Soft, non tender, non distended, bowel sounds present, no guarding  Data Reviewed: Basic Metabolic Panel:  Recent Labs Lab 11/30/13 0553 12/01/13 0610 12/02/13 0432 12/03/13 0534 12/04/13 0847 12/05/13 0530  NA 142 143 140 140 144 141  K 3.5* 3.1* 2.8* 2.9* 3.3* 3.5*  CL 105 106 103 105 107 106  CO2 26 26 23 24 28 26   GLUCOSE 74 73 75 117* 95 101*  BUN 9 7 7  5* 3* 4*  CREATININE 0.95 0.96 0.98 0.95 0.92 0.93  CALCIUM 7.9* 7.9* 7.6* 7.8* 8.0* 7.7*  MG 1.2* 1.2* 1.1* 1.8  --  1.4*  PHOS  --   --   --  3.5 3.1 2.9   Liver Function Tests:  Recent Labs Lab 12/01/13 0610 12/02/13 0432 12/03/13 0534 12/04/13 0847 12/05/13 0530  AST 14 13 13 13 12   ALT 6 6 7 6 6   ALKPHOS 55 49 59 68 63  BILITOT 0.2* <0.2* <0.2* <0.2* <0.2*  PROT 4.9* 4.8* 5.2* 5.3* 5.0*  ALBUMIN 1.8* 1.7* 1.9* 1.9* 1.7*   CBC:  Recent Labs Lab 12/01/13 0610 12/02/13 0432 12/03/13 0534 12/04/13 0847 12/05/13 0530  WBC 6.1 5.5 5.6 5.9 6.0  NEUTROABS 3.1 2.3 2.9 3.1 2.4  HGB 7.8* 7.3* 8.0* 8.0* 7.8*  HCT 23.7* 22.4* 24.0* 23.6* 23.1*  MCV 89.1 91.4 90.9 91.5 91.7  PLT 289 312 324 342 322   CBG:  Recent Labs Lab 12/05/13 0007 12/05/13 0358 12/05/13 0739 12/05/13 1129 12/05/13 1612  GLUCAP 104* 95 100* 93 99    Recent Results (from the past 240 hour(s))  CLOSTRIDIUM DIFFICILE BY PCR     Status: None   Collection Time    11/27/13 10:46 AM      Result Value Ref Range Status   C difficile by pcr NEGATIVE  NEGATIVE  Final     Scheduled Meds: . buPROPion  75 mg Per Tube BID  . chlorpheniramine-HYDROcodone  5 mL Per Tube Q12H  . escitalopram  20 mg Oral Daily  . guaiFENesin  15 mL Per Tube 4 times per day  . HYDROcodone-acetaminophen  2 tablet Oral Once  . lamoTRIgine  200 mg Oral Daily  . levothyroxine  88 mcg Oral QAC breakfast  . magnesium oxide  400 mg Per Tube BID  . metoprolol tartrate  12.5 mg Oral BID  . ondansetron (ZOFRAN) IV  4 mg Intravenous 4 times per day  . pantoprazole sodium  40 mg Per Tube BID  . QUEtiapine  100 mg Oral BID  . saccharomyces boulardii  250 mg Oral BID  . sucralfate  1 g Oral TID AC & HS  . Warfarin - Pharmacist Dosing Inpatient   Does not apply q1800   Continuous Infusions: . dextrose 5 % and 0.45% NaCl 1,000 mL (12/05/13 1530)  . feeding supplement (VITAL AF 1.2 CAL) 1,000 mL (12/05/13 1522)  . heparin 1,350 Units/hr (12/05/13 1121)   Faye Ramsay, MD  [facility] Pager [phone]  If 7PM-7AM, please contact night-coverage [email or web address] Password [email or web address] 12/05/2013, 7:08 PM   LOS: 27 days

## 2013-12-06 LAB — CLOSTRIDIUM DIFFICILE BY PCR: Toxigenic C. Difficile by PCR: NEGATIVE

## 2013-12-06 LAB — CBC WITH DIFFERENTIAL/PLATELET
BASOS ABS: 0 10*3/uL (ref 0.0–0.1)
Basophils Relative: 0 % (ref 0–1)
EOS PCT: 2 % (ref 0–5)
Eosinophils Absolute: 0.1 10*3/uL (ref 0.0–0.7)
HEMATOCRIT: 23.7 % — AB (ref 36.0–46.0)
Hemoglobin: 8.1 g/dL — ABNORMAL LOW (ref 12.0–15.0)
LYMPHS ABS: 2.6 10*3/uL (ref 0.7–4.0)
LYMPHS PCT: 40 % (ref 12–46)
MCH: 31.2 pg (ref 26.0–34.0)
MCHC: 34.2 g/dL (ref 30.0–36.0)
MCV: 91.2 fL (ref 78.0–100.0)
MONOS PCT: 14 % — AB (ref 3–12)
Monocytes Absolute: 0.9 10*3/uL (ref 0.1–1.0)
NEUTROS ABS: 3 10*3/uL (ref 1.7–7.7)
Neutrophils Relative %: 44 % (ref 43–77)
PLATELETS: 351 10*3/uL (ref 150–400)
RBC: 2.6 MIL/uL — AB (ref 3.87–5.11)
RDW: 22.6 % — AB (ref 11.5–15.5)
WBC: 6.6 10*3/uL (ref 4.0–10.5)

## 2013-12-06 LAB — GLUCOSE, CAPILLARY
GLUCOSE-CAPILLARY: 103 mg/dL — AB (ref 70–99)
GLUCOSE-CAPILLARY: 108 mg/dL — AB (ref 70–99)
GLUCOSE-CAPILLARY: 88 mg/dL (ref 70–99)
Glucose-Capillary: 92 mg/dL (ref 70–99)
Glucose-Capillary: 78 mg/dL (ref 70–99)
Glucose-Capillary: 97 mg/dL (ref 70–99)

## 2013-12-06 LAB — COMPREHENSIVE METABOLIC PANEL
ALBUMIN: 1.8 g/dL — AB (ref 3.5–5.2)
ALK PHOS: 62 U/L (ref 39–117)
ALT: 6 U/L (ref 0–35)
AST: 14 U/L (ref 0–37)
BUN: 4 mg/dL — AB (ref 6–23)
CO2: 27 mEq/L (ref 19–32)
CREATININE: 0.84 mg/dL (ref 0.50–1.10)
Calcium: 8.1 mg/dL — ABNORMAL LOW (ref 8.4–10.5)
Chloride: 105 mEq/L (ref 96–112)
GFR calc non Af Amer: 73 mL/min — ABNORMAL LOW (ref >90)
GFR, EST AFRICAN AMERICAN: 85 mL/min — AB (ref >90)
GLUCOSE: 90 mg/dL (ref 70–99)
Potassium: 3.3 mEq/L — ABNORMAL LOW (ref 3.7–5.3)
Sodium: 142 mEq/L (ref 137–147)
TOTAL PROTEIN: 5.2 g/dL — AB (ref 6.0–8.3)
Total Bilirubin: <0.2 mg/dL — ABNORMAL LOW (ref 0.3–1.2)

## 2013-12-06 LAB — PROTIME-INR
INR: 1.59 — ABNORMAL HIGH (ref 0.00–1.49)
Prothrombin Time: 18.5 seconds — ABNORMAL HIGH (ref 11.6–15.2)

## 2013-12-06 LAB — HEPARIN LEVEL (UNFRACTIONATED)
HEPARIN UNFRACTIONATED: 0.18 [IU]/mL — AB (ref 0.30–0.70)
Heparin Unfractionated: <0.1 IU/mL — ABNORMAL LOW (ref 0.30–0.70)
Heparin Unfractionated: 0.73 IU/mL — ABNORMAL HIGH (ref 0.30–0.70)

## 2013-12-06 LAB — MAGNESIUM: Magnesium: 1.3 mg/dL — ABNORMAL LOW (ref 1.5–2.5)

## 2013-12-06 MED ORDER — MAGNESIUM SULFATE 40 MG/ML IJ SOLN
2.0000 g | Freq: Once | INTRAMUSCULAR | Status: AC
  Administered 2013-12-06: 2 g via INTRAVENOUS
  Filled 2013-12-06: qty 50

## 2013-12-06 MED ORDER — HEPARIN (PORCINE) IN NACL 100-0.45 UNIT/ML-% IJ SOLN
1750.0000 [IU]/h | INTRAMUSCULAR | Status: DC
  Administered 2013-12-07 (×2): 1500 [IU]/h via INTRAVENOUS
  Administered 2013-12-08: 1300 [IU]/h via INTRAVENOUS
  Administered 2013-12-09: 1400 [IU]/h via INTRAVENOUS
  Administered 2013-12-10: 1750 [IU]/h via INTRAVENOUS
  Administered 2013-12-10: 1400 [IU]/h via INTRAVENOUS
  Filled 2013-12-06 (×10): qty 250

## 2013-12-06 MED ORDER — POTASSIUM CHLORIDE 10 MEQ/100ML IV SOLN
10.0000 meq | INTRAVENOUS | Status: AC
  Administered 2013-12-06 (×3): 10 meq via INTRAVENOUS
  Filled 2013-12-06 (×3): qty 100

## 2013-12-06 NOTE — Progress Notes (Addendum)
ANTICOAGULATION CONSULT NOTE - Follow Up Consult  Pharmacy Consult for heparin Indication: DVT  Labs:  Recent Labs  12/04/13 0847 12/05/13 0530 12/06/13 0500 12/06/13 1315 12/06/13 2225  HGB 8.0* 7.8* 8.1*  --   --   HCT 23.6* 23.1* 23.7*  --   --   PLT 342 322 351  --   --   LABPROT 19.3* 19.7* 18.5*  --   --   INR 1.68* 1.72* 1.59*  --   --   HEPARINUNFRC 0.36 0.51 0.18* <0.10* 0.73*  CREATININE 0.92 0.93 0.84  --   --     Assessment: 63yo female now slightly supratherapeutic on heparin after rate increase.  Goal of Therapy:  Heparin level 0.3-0.7 units/ml   Plan:  Will decrease heparin gtt slightly to 1800 units/hr and check level with am labs.  Wynona Neat, PharmD, BCPS  12/06/2013,11:10 PM  Addum:  Heparin level 0.76.  Will decrease heparin drip to 1700 units/hr. Recheck level in 8 hours.

## 2013-12-06 NOTE — Progress Notes (Signed)
ANTICOAGULATION CONSULT NOTE - Follow Up Consult  Pharmacy Consult for heparin (coumadin on hold) Indication: DVT  Allergies  Allergen Reactions  . Aspirin Rash    Patient Measurements: Height: 5\' 6"  (167.6 cm) Weight: 260 lb 1.6 oz (117.981 kg) IBW/kg (Calculated) : 59.3 Heparin Dosing Weight: 89 kg  Vital Signs: Temp: 98.8 F (37.1 C) (03/29 1000) Temp src: Oral (03/29 1000) BP: 119/73 mmHg (03/29 1000) Pulse Rate: 94 (03/29 1000)  Labs:  Recent Labs  12/04/13 0847 12/05/13 0530 12/06/13 0500 12/06/13 1315  HGB 8.0* 7.8* 8.1*  --   HCT 23.6* 23.1* 23.7*  --   PLT 342 322 351  --   LABPROT 19.3* 19.7* 18.5*  --   INR 1.68* 1.72* 1.59*  --   HEPARINUNFRC 0.36 0.51 0.18* <0.10*  CREATININE 0.92 0.93 0.84  --     Estimated Creatinine Clearance: 90.8 ml/min (by C-G formula based on Cr of 0.84).  Assessment: Patient is 63 y.o F on anticoagulation overlap day #5 of 5 days minimum for new DVT, HOWEVER will also need therapeutic INR x 24 hrs before heparin can be d/c'd.  INR 1.59 today.  No bleeding documented.  Heparin level low this morning and drip rate was turned up to 1600 units/hr.  (confirmed with RN).  However, heparin level has continued to fall and is now <0.1.  Goal of Therapy:  Heparin level 0.3-0.7 Monitor platelets by anticoagulation protocol: Yes   Plan:  1) Increase heparin gtt to 1850 units/hr. 2) Recheck heparin level in 6 hrs. 3) Continue heparin level and CBC. 4) Will f/u plans to resume Coumadin after G tube placement.  Uvaldo Rising, BCPS  Clinical Pharmacist Pager [phone]  12/06/2013 3:16 PM \

## 2013-12-06 NOTE — Progress Notes (Signed)
IR aware of request for gastrostomy tube placement. Chart reviewed, pt with aspiration PNA. Currently has Dobhoff with tip in the duodenum. Based on this, pt would likely need G-J tube. Will review recent imaging and discuss with Rad. INR is 1.59 as pt is on po Coumadin as well as Heparin gtt  IR team will follow up on 3/30, but would need Coumadin to be held and INR to be < 1.5 for procedure.   Ascencion Dike PA-C Interventional Radiology 12/06/2013 9:58 AM

## 2013-12-06 NOTE — Progress Notes (Signed)
Patient ID: [name], female   DOB: 1951/02/16, 63 y.o.   MRN: [id number]  [facility] PROGRESS NOTE  [name] [id number] DOB: 18-Nov-1950 DOA: 11/14/2013 PCP: [name], MD  Brief narrative:  63 yo female with hypertension, hypothyroidism and psychosis who was discharged on 2/25 after hospitalization for C. difficile colitis and we admitted on 3/1 for acute respiratory failure secondary to pneumonia. She was admitted to the critical care service and intubated and on 3/5 extubated and transferred to the hospitalist service.   EVENTS:  3/11 NPO, EGD --> report below  3/13 patient feeling improved, negative N./V. since placement of NG tube  3/14 patient with only mild episodes of nausea, states she understands the plan outlined by Dr. Olevia Perches (GI) concerning the need for colonoscopy  3/15 patient states her N./V. has resolved with NG tube placement.  3/26 patient states spoke with family about needing to have a J-tube placed   Assessment/Plan:  Acute respiratory failure:  - Secondary to aspiration pneumonia.  - clinically improved and maintaining oxygen saturation at target range  - Continue NG tube, pt in agreement with PEG placement, we appreciate IR assistance, will need to hold Coumadin today and tomorrow prior to procedure to ensure INR < 1.5 - 3/17 patient S/P fluoroscopic placement of a Dobhoff tube  - Respiratory failure has resolved since placement of Dobhoff tube  Pulmonary hypertension  - reasonable inpatient control  - continue metoprolol 12.5 mg PO BID  Upper GI bleed:  - Patient has significant pathology; esophagitis, duodenal ulcer, duodenitis. See EGD report below  - Continue all medications to be administered via Dobhoff tube (carafate, PPI po bid, bid pepcid, florastor)  - Hg and Hct stable over the past 48 hours  Left upper extremity DVT 3/20  - continue coumadin per pharmacy post G-J tube placement  Aspiration Pneumonia:  - Patient made  n.p.o. secondary to refractory N./V. and increased risk of aspirating.  - Patient has been without nutrition since admission on 3/1 consult nutrition and pharmacy for NG tube feeding.  - 3 /16 unable to continue NG tube feeding secondary to large residuals.  - 3/17 S/P IR placement of Dobhoff tube and started enteral feeding  - 3/25 patient was tried on clear liquids and dysphagia diet and both have caused patient extreme episodes of N./V.  - 3/26 reconsulted [facility] to evaluate patient for placement of J-tube  - appreciate IR assistance with G-J tube placement  Dysphagia:  - follow up on GI recommendations  - appreciate IR assistance with PEG placement  Depression  - continue Lexapro, Wellbutrin, via NG tube  Hypothyroidism:  - Synthroid 88 mcg per NG tube  Hyperlipidemia:  - Stable.  Hypertension:  - reasonable inpatient control  C. difficile colitis:  - Continue Flagyl+ PO vancomycin. Consider 3/13 as first full day of tx (3/13 NG tube placed and patient able to take)  - CT scan abdomen and pelvis completed; significant pathology to include possible neoplasm)  - 3/26 review of patient's MAR shows that flagyl and vanc stopped on 3/21, on 3/23 patient began to have watery diarrhea again.  - 3/28 C. difficile by PCR negative  Hx psychosis:  - Likely being off of Seroquel, Lamictal and Lexapro for almost one week  - now resolved  Hypernatremia:  - Resolved  - Check CMP in the a.m.  Hypokalemia  - 3/14 replace k 6 runs secondary to refractory N./V., and patient having bowel prep for colonoscopy this evening  -  3/15 replace potassium 10 meq x6 runs secondary to loss through NG tube/bowel preparation for her colonoscopy  - 3/16 replace potassium 10 meq x 5 runs  - 3/17 replace potassium 68meq x 6 runs  - 3/25 replace potassium 72meq x 5 runs  - 3/26 replace potassium 90meq x 5 runs  - 3/27 replace potassium 3meq x 3 runs  - 3/28 replace potassium 25meq x 3 runs  - 3/29 replace  potassium 63meq x 3 runs Hypomagnesemia:  - 3/16 replete magnesium 3 gm IV  - 3/25 replete magnesium 4 gm IV  - 3/26 replete magnesium 400 mg x1 via Dobhoff  - 3/26 continue magnesium oxide via Dobhoff BID  - give Mg 2 gm IV 3/29 Nausea and vomiting  - NG tube placed 3/13  - Most likely secondary to gastroparesis + patient significant pathology i.e. sophagitis/duodenitis/duodenal ulcer.  - Continue scheduled Zofran  - Thorazine 25 mg BID; after patient tolerating enteral feeds consider changing to PRN/DC  - 3/25 DC Reglan 5 mg QID secondary to watery diarrhea starting 3/23  Moderate malnutrition  - secondary to acute illness  - tube feeding only for now  - pt agreeable with PEG placement   Code Status: Full code  Family Communication: None  Disposition Plan: remains inpatient   Consultants:  Dr. Delfin Edis (GI)  Procedures:  Left upper extremity venous duplex on 11/27/2013 Acute DVT and SVT involving the left subclavian vein, left axillary vein, left brachial vein, and left cephalic vein.  Colonoscopy with bx 12/08/2013 Two sessile polyps in descending colon s/p polypectomy, diffuse nonspecific colitis c/w resolving colitis, s/p random biopsies  Abdomen pelvis with contrast 11/20/2013 Infectious or inflammatory colitis such as C. Diff, filling defect in the rectum, ? Malignancy. Aspiration in the RLL  EGD 11/17/2013 severe grade 4 reflux esophagitis status post biopsies. Small duodenal ulcer without stigmata of bleeding. Moderately severe duodenitis status post biopsies  Left PICC/Midline 3/13>> stopped 3/20  NGT 3/13>>>  Intubation 3/2-3/5  NG tube 3/2-3/5  Right IJ central line 3/1-3/6  ECHO 11/09/2013 EF 55% Antibiotics:  IV Zosyn 3/1-stopped 3/10  IV Flagyl 3/7>> stopped 3/21  PO vancomycin 3/7>> stopped 3/21   HPI/Subjective: No events overnight.   Objective: Filed Vitals:   12/05/13 1732 12/05/13 2015 12/06/13 0400 12/06/13 1000  BP: 114/63 152/89 125/78 119/73  Pulse:  93 91 87 94  Temp: 98.4 F (36.9 C) 99.7 F (37.6 C) 98.6 F (37 C) 98.8 F (37.1 C)  TempSrc: Oral Oral Oral Oral  Resp: 18 19 18 18   Height:  5\' 6"  (1.676 m)    Weight:  117.981 kg (260 lb 1.6 oz)    SpO2: 98% 93% 97% 100%    Intake/Output Summary (Last 24 hours) at 12/06/13 1459 Last data filed at 12/06/13 0701  Gross per 24 hour  Intake 2040.53 ml  Output   2475 ml  Net -434.47 ml    Exam:   General:  Pt is alert, follows commands appropriately, not in acute distress  Cardiovascular: Regular rate and rhythm, S1/S2, no murmurs, no rubs, no gallops  Respiratory: Clear to auscultation bilaterally, no wheezing, no crackles, no rhonchi  Abdomen: Soft, non tender, non distended, bowel sounds present, no guarding  Extremities: No edema, pulses DP and PT palpable bilaterally  Neuro: Grossly nonfocal  Data Reviewed: Basic Metabolic Panel:  Recent Labs Lab 12/01/13 0610 12/02/13 0432 12/03/13 0534 12/04/13 0847 12/05/13 0530 12/06/13 0500  NA 143 140 140 144 141 142  K 3.1* 2.8* 2.9* 3.3* 3.5* 3.3*  CL 106 103 105 107 106 105  CO2 26 23 24 28 26 27   GLUCOSE 73 75 117* 95 101* 90  BUN 7 7 5* 3* 4* 4*  CREATININE 0.96 0.98 0.95 0.92 0.93 0.84  CALCIUM 7.9* 7.6* 7.8* 8.0* 7.7* 8.1*  MG 1.2* 1.1* 1.8  --  1.4* 1.3*  PHOS  --   --  3.5 3.1 2.9  --    Liver Function Tests:  Recent Labs Lab 12/02/13 0432 12/03/13 0534 12/04/13 0847 12/05/13 0530 12/06/13 0500  AST 13 13 13 12 14   ALT 6 7 6 6 6   ALKPHOS 49 59 68 63 62  BILITOT <0.2* <0.2* <0.2* <0.2* <0.2*  PROT 4.8* 5.2* 5.3* 5.0* 5.2*  ALBUMIN 1.7* 1.9* 1.9* 1.7* 1.8*   CBC:  Recent Labs Lab 12/02/13 0432 12/03/13 0534 12/04/13 0847 12/05/13 0530 12/06/13 0500  WBC 5.5 5.6 5.9 6.0 6.6  NEUTROABS 2.3 2.9 3.1 2.4 3.0  HGB 7.3* 8.0* 8.0* 7.8* 8.1*  HCT 22.4* 24.0* 23.6* 23.1* 23.7*  MCV 91.4 90.9 91.5 91.7 91.2  PLT 312 324 342 322 351   CBG:  Recent Labs Lab 12/05/13 2014 12/06/13  12/06/13 0357 12/06/13 0754 12/06/13 1158  GLUCAP 114* 88 92 103* 108*    Recent Results (from the past 240 hour(s))  CLOSTRIDIUM DIFFICILE BY PCR     Status: None   Collection Time    11/27/13 10:46 AM      Result Value Ref Range Status   C difficile by pcr NEGATIVE  NEGATIVE Final  CLOSTRIDIUM DIFFICILE BY PCR     Status: None   Collection Time    12/05/13  8:11 PM      Result Value Ref Range Status   C difficile by pcr NEGATIVE  NEGATIVE Final     Scheduled Meds: . buPROPion  75 mg Per Tube BID  . chlorpheniramine-HYDROcodone  5 mL Per Tube Q12H  . escitalopram  20 mg Oral Daily  . guaiFENesin  15 mL Per Tube 4 times per day  . HYDROcodone-acetaminophen  2 tablet Oral Once  . lamoTRIgine  200 mg Oral Daily  . levothyroxine  88 mcg Oral QAC breakfast  . magnesium oxide  400 mg Per Tube BID  . metoprolol tartrate  12.5 mg Oral BID  . ondansetron (ZOFRAN) IV  4 mg Intravenous 4 times per day  . pantoprazole sodium  40 mg Per Tube BID  . QUEtiapine  100 mg Oral BID  . saccharomyces boulardii  250 mg Oral BID  . sucralfate  1 g Oral TID AC & HS   Continuous Infusions: . dextrose 5 % and 0.45% NaCl 30 mL/hr at 12/05/13 2016  . feeding supplement (VITAL AF 1.2 CAL) 1,000 mL (12/05/13 1522)  . heparin 1,600 Units/hr (12/06/13 0272)   Faye Ramsay, MD  [facility] Pager [phone]  If 7PM-7AM, please contact night-coverage [email or web address] Password [email or web address] 12/06/2013, 2:59 PM   LOS: 28 days

## 2013-12-06 NOTE — Progress Notes (Signed)
ANTICOAGULATION CONSULT NOTE - Follow Up Consult  Pharmacy Consult for heparin Indication: DVT  Labs:  Recent Labs  12/04/13 0847 12/05/13 0530 12/06/13 0500  HGB 8.0* 7.8* 8.1*  HCT 23.6* 23.1* 23.7*  PLT 342 322 351  LABPROT 19.3* 19.7* 18.5*  INR 1.68* 1.72* 1.59*  HEPARINUNFRC 0.36 0.51 0.18*  CREATININE 0.92 0.93 0.84    Assessment: 62yo female now subtherapeutic on heparin after a few levels at goal; no gtt issues overnight per RN.  Goal of Therapy:  Heparin level 0.3-0.7 units/ml   Plan:  Will increase heparin gtt by 2 units/kg/hr to 1600 units/hr and check level in 6hr.  Wynona Neat, PharmD, BCPS  12/06/2013,7:00 AM

## 2013-12-07 LAB — GLUCOSE, CAPILLARY
GLUCOSE-CAPILLARY: 83 mg/dL (ref 70–99)
GLUCOSE-CAPILLARY: 85 mg/dL (ref 70–99)
GLUCOSE-CAPILLARY: 90 mg/dL (ref 70–99)
GLUCOSE-CAPILLARY: 98 mg/dL (ref 70–99)
Glucose-Capillary: 83 mg/dL (ref 70–99)

## 2013-12-07 LAB — COMPREHENSIVE METABOLIC PANEL
ALBUMIN: 1.9 g/dL — AB (ref 3.5–5.2)
ALK PHOS: 64 U/L (ref 39–117)
ALT: 8 U/L (ref 0–35)
AST: 17 U/L (ref 0–37)
BUN: 6 mg/dL (ref 6–23)
CHLORIDE: 106 meq/L (ref 96–112)
CO2: 28 mEq/L (ref 19–32)
Calcium: 8.3 mg/dL — ABNORMAL LOW (ref 8.4–10.5)
Creatinine, Ser: 0.81 mg/dL (ref 0.50–1.10)
GFR calc Af Amer: 88 mL/min — ABNORMAL LOW (ref >90)
GFR calc non Af Amer: 76 mL/min — ABNORMAL LOW (ref >90)
Glucose, Bld: 98 mg/dL (ref 70–99)
Potassium: 3.9 mEq/L (ref 3.7–5.3)
SODIUM: 143 meq/L (ref 137–147)
TOTAL PROTEIN: 5.5 g/dL — AB (ref 6.0–8.3)

## 2013-12-07 LAB — CBC WITH DIFFERENTIAL/PLATELET
BASOS ABS: 0 10*3/uL (ref 0.0–0.1)
Basophils Relative: 0 % (ref 0–1)
Eosinophils Absolute: 0.1 10*3/uL (ref 0.0–0.7)
Eosinophils Relative: 2 % (ref 0–5)
HEMATOCRIT: 24.8 % — AB (ref 36.0–46.0)
Hemoglobin: 8.1 g/dL — ABNORMAL LOW (ref 12.0–15.0)
LYMPHS ABS: 2.5 10*3/uL (ref 0.7–4.0)
Lymphocytes Relative: 38 % (ref 12–46)
MCH: 30.1 pg (ref 26.0–34.0)
MCHC: 32.7 g/dL (ref 30.0–36.0)
MCV: 92.2 fL (ref 78.0–100.0)
MONO ABS: 0.9 10*3/uL (ref 0.1–1.0)
Monocytes Relative: 13 % — ABNORMAL HIGH (ref 3–12)
Neutro Abs: 3.1 10*3/uL (ref 1.7–7.7)
Neutrophils Relative %: 47 % (ref 43–77)
PLATELETS: 390 10*3/uL (ref 150–400)
RBC: 2.69 MIL/uL — ABNORMAL LOW (ref 3.87–5.11)
RDW: 22.6 % — ABNORMAL HIGH (ref 11.5–15.5)
WBC: 6.6 10*3/uL (ref 4.0–10.5)

## 2013-12-07 LAB — HEPARIN LEVEL (UNFRACTIONATED)
HEPARIN UNFRACTIONATED: 0.82 [IU]/mL — AB (ref 0.30–0.70)
Heparin Unfractionated: 0.76 IU/mL — ABNORMAL HIGH (ref 0.30–0.70)

## 2013-12-07 LAB — PROTIME-INR
INR: 1.29 (ref 0.00–1.49)
Prothrombin Time: 15.8 seconds — ABNORMAL HIGH (ref 11.6–15.2)

## 2013-12-07 LAB — MAGNESIUM: MAGNESIUM: 1.6 mg/dL (ref 1.5–2.5)

## 2013-12-07 NOTE — Progress Notes (Signed)
Occupational Therapy Treatment Patient Details Name: [name] MRN: [id number] DOB: 07-Sep-1951 Today's Date: 12/07/2013    History of present illness 63 year old obese female with history of HTN, GERD, depression, obesity and lumbar spondylosis status post lumbar PLIF surgery in January 2015.  Just discharged 11/04/13 after treatment for c.diff colitis and salmonella enterocolitis. Hospitalization complicated by encephalopathy and acute renal failure (peak creatinine 3.03). Admitted 2/25 with respiratory failure though due to combination PNA and HF. Pt DC to [facility] after PLIF was home 1wk with falls and difficulty when she was readmitted last time then return to [facility] at that DC   OT comments  Pt making good progress with functional goals and should continue with acute OT services to address impairments to increase level of function and safety  Follow Up Recommendations  SNF;Supervision/Assistance - 24 hour    Equipment Recommendations  None recommended by OT    Recommendations for Other Services      Precautions / Restrictions Precautions Precautions: Fall;Back Precaution Comments: pt able to verbalize 3/3 back precautions, min verbal cues to maintain during grooming standing  at sink Restrictions Weight Bearing Restrictions: No       Mobility Bed Mobility               General bed mobility comments: pt up in recliner  Transfers Overall transfer level: Needs assistance Equipment used: Rolling walker (2 wheeled) Transfers: Sit to/from Stand Sit to Stand: Min assist         General transfer comment: assist for lifting up off seating surface    Balance Overall balance assessment: Needs assistance Sitting-balance support: No upper extremity supported;Feet supported Sitting balance-Leahy Scale: Good     Standing balance support: Single extremity supported;Bilateral upper extremity supported;During functional activity Standing balance-Leahy Scale:  Poor                     ADL   Grooming: Wash/dry hands;Wash/dry face;Standing;Minimal assistance;Set up;Cueing for safety     Lower Body Bathing: Sit to/from stand;Moderate assistance;Minimal assistance;Cueing for back precautions   Toilet Transfer: Minimal assistance;RW;BSC Toileting- Clothing Manipulation and Hygiene: Moderate assistance;Sit to/from stand;Sitting/lateral lean;Cueing for back precautions;Minimal assistance   Functional mobility during ADLs: Minimal assistance;Cueing for safety General ADL Comments: pt required increased time to complete ADL/grooming tasks standing at sink and toileitng at [place] During Therapy: [facility] for tasks assessed/performed Overall Cognitive Status: Within Functional Limits for tasks assessed                                             General Comments  Pt improving, pleasant and cooperative    Pertinent Vitals/ Pain       No c/o pain , VSS                                                          Frequency Min 2X/week     Progress Toward Goals  OT Goals(current goals can now be  found in the care plan section)  Progress towards OT goals: Progressing toward goals  Acute Rehab OT Goals Patient Stated Goal: be able to walk and return home  Plan Discharge plan remains appropriate    End of Session Equipment Utilized During Treatment: Rolling walker;Gait belt;Other (comment) [facility])  Activity Tolerance Patient tolerated treatment well   Patient Left with call bell/phone within reach;with bed alarm set;in chair             Time: 3825-0539 OT Time Calculation (min): 25 min  Charges: OT General Charges $OT Visit: 1 Procedure OT Treatments $Self Care/Home Management : 23-37 mins  [name] 12/07/2013, 2:54 PM

## 2013-12-07 NOTE — Progress Notes (Signed)
Physical Therapy Treatment Patient Details Name: [name] MRN: [id number] DOB: [birth year] Today's Date: 12/07/2013    History of Present Illness 63 year old obese female with history of HTN, GERD, depression, obesity and lumbar spondylosis status post lumbar PLIF surgery in January 2015.  Just discharged 11/04/13 after treatment for c.diff colitis and salmonella enterocolitis. Hospitalization complicated by encephalopathy and acute renal failure (peak creatinine 3.03). Admitted 2/25 with respiratory failure though due to combination PNA and HF. Pt DC to [facility] after PLIF was home 1wk with falls and difficulty when she was readmitted last time then return to [facility] at that DC    PT Comments    Progressed with ambulation and noted improved activity tolerance.  Has steadily improved since began tube feeds.  Will need SNF rehab prior to d/c home.  Follow Up Recommendations  SNF     Equipment Recommendations  None recommended by PT    Recommendations for Other Services       Precautions / Restrictions Precautions Precautions: Fall;Back    Mobility  Bed Mobility Overal bed mobility: Needs Assistance   Rolling: Supervision Sidelying to sit: Min assist          Transfers Overall transfer level: Needs assistance Equipment used: Rolling walker (2 wheeled) Transfers: Sit to/from Stand Sit to Stand: Min assist         General transfer comment: assist for lifting up off seating surface  Ambulation/Gait Ambulation/Gait assistance: Min assist Ambulation Distance (Feet): 90 Feet (x 2) Assistive device: Rolling walker (2 wheeled) Gait Pattern/deviations: Step-through pattern;Decreased stride length;Wide base of support;Trendelenburg     General Gait Details: right trendelenberg and right knee buckling couple of times first walk.  Patient able to turn walker unaided, slow pace and heavily reliant on walker   Stairs            Wheelchair Mobility     Modified Rankin (Stroke Patients Only)       Balance Overall balance assessment: Needs assistance         Standing balance support: Bilateral upper extremity supported Standing balance-Leahy Scale: Poor Standing balance comment: needs UE assist for balance                    Cognition Arousal/Alertness: Awake/alert Behavior During Therapy: WFL for tasks assessed/performed Overall Cognitive Status: Within Functional Limits for tasks assessed                      Exercises General Exercises - Lower Extremity Long Arc Quad: AROM;Both;10 reps;Seated Hip Flexion/Marching: AROM;Both;10 reps;Seated Toe Raises: AROM;Both;10 reps;Seated Heel Raises: AROM;Both;10 reps;Seated    General Comments        Pertinent Vitals/Pain Min c/o right knee pain with ambulation    Home Living                      Prior Function            PT Goals (current goals can now be found in the care plan section) Acute Rehab PT Goals Patient Stated Goal: be able to walk and return home PT Goal Formulation: With patient Time For Goal Achievement: 12/21/13 Potential to Achieve Goals: Good Progress towards PT goals: Goals met and updated - see care plan    Frequency  Min 2X/week    PT Plan Current plan remains appropriate    End of Session Equipment Utilized During Treatment: Gait belt Activity Tolerance: Patient limited by fatigue Patient left: in  chair;with call bell/phone within reach     Time: 0945-1011 PT Time Calculation (min): 26 min  Charges:  $Gait Training: 8-22 mins $Therapeutic Exercise: 8-22 mins                    G Codes:      Myleen Brailsford,[name] 16-Dec-2013, 10:36 AM Magda Kiel, PT [phone] Dec 16, 2013

## 2013-12-07 NOTE — Progress Notes (Addendum)
IR PA has discussed this case with Dr. Barbie Banner today, given recent EGD findings patient will need additional 4 weeks on therapy prior to G-tube/ GJ tube placement to minimize bleeding complications. Any questions please feel free to contact Dr. Barbie Banner (P) [phone], please re-consult IR in 4 weeks if needed.  Tsosie Billing PA-C Interventional Radiology  12/07/13  11:05 AM

## 2013-12-07 NOTE — Progress Notes (Signed)
NUTRITION FOLLOW UP  Pt meets criteria for severe MALNUTRITION in the context of acute illness as evidenced by intake of <50% x at least 5 days and moderate muscle wasting.  Intervention:   Continue Vital AF 1.2 @  goal rate of 60 ml/hr to provide 1728 kcals, 108 grams of protein, and 1168 ml of free water. Will continue to follow nutrition care plan.  Nutrition Dx:   Inadequate oral intake now related to GI distress AEB inability to tolerate PO's. Ongoing.  Goal:   Pt to meet >/= 90% of their estimated nutrition needs; currently met.  Monitor:   Enteral nutrition tolerance, weight trends, labs, I/O's  Assessment:   63 year old obese female with history of HTN, GERD, depression, obesity and lumbar spondylosis status post lumbar decompression surgery in January 2015. Just discharged 11/04/13 after treatment for c.diff colitis and salmonella enterocolitis. Hospitalization complicated by encephalopathy and acute renal failure (peak creatinine 3.03). Admitted 2/25 with respiratory failure thought due to combination PNA and HF.   Patient was extubated on 3/5. BSE on 3/6 with recommendations for Dysphagia 1 with Nectar Thick liquids. Pt has been on and off of a full liquid diet since 3/6. Pt underwent endoscopy 3/11. Findings consistent with severe reflux esophagitis. Pt with likely gastroparesis. CT scan of abdomen worrisome for rectal mass and colitis. Pt underwent NGT placement per RN on 3/13 for low suction to prevent continuous reflux and possible aspiration.   RD consulted to initiate NGT enteral nutrition on 3/15, feedings were initiated, however MD discontinued enteral nutrition later that afternoon.  On 3/16, pt underwent postpyloric feeding tube placement, per notes - tip of feeding tube near ligament of Treitz in good position. TF held on 3/20 for clear liquid diet trial. Pt transitioned to full liquids, and then soft diet, however pt was unable to tolerate any liquids or solid foods.  Calorie count briefly initiated (3/24 - 3/35), however it was d/c'd as pt was unable po's.  Tube feeding resumed 3/25. Pt also transitioned to elemental formula. Remains on elemental formula as of this time. Receiving Vital AF 1.2 at 60 ml/hr via naso-jejunal tube. Tolerating well, per RN. Discussed plan of care with Case Manager. Pt is agreeable to permanent access, radiology following, plan for G-J tube once INR is therapeutic.  Potassium, magnesium and phosphorous WNL.  Height: Ht Readings from Last 1 Encounters:  12/06/13 '5\' 6"'  (1.676 m)    Weight Status:   Wt Readings from Last 1 Encounters:  12/07/13 255 lb (115.667 kg)  Admit wt(11/12/2013)250 lb  Re-estimated needs:  Kcal: 1800 - 2100  Protein: 100 - 110  Fluid: 1.8 - 2 L/day  Skin: Stage II to upper medial sacrum  Stage II to mid medial sacrum  Stage II to lower medial sacrum  Diet Order: NPO   Intake/Output Summary (Last 24 hours) at 12/07/13 1047 Last data filed at 12/06/13 2300  Gross per 24 hour  Intake   1323 ml  Output   1000 ml  Net    323 ml    Last BM: 3/27   Labs:   Recent Labs Lab 12/03/13 0534 12/04/13 0847 12/05/13 0530 12/06/13 0500 12/07/13 0641  NA 140 144 141 142 143  K 2.9* 3.3* 3.5* 3.3* 3.9  CL 105 107 106 105 106  CO2 '24 28 26 27 28  ' BUN 5* 3* 4* 4* 6  CREATININE 0.95 0.92 0.93 0.84 0.81  CALCIUM 7.8* 8.0* 7.7* 8.1* 8.3*  MG 1.8  --  1.4* 1.3* 1.6  PHOS 3.5 3.1 2.9  --   --   GLUCOSE 117* 95 101* 90 98    CBG (last 3)   Recent Labs  12/07/13 0005 12/07/13 0408 12/07/13 0841  GLUCAP 85 90 98    Scheduled Meds: . buPROPion  75 mg Per Tube BID  . chlorpheniramine-HYDROcodone  5 mL Per Tube Q12H  . escitalopram  20 mg Oral Daily  . guaiFENesin  15 mL Per Tube 4 times per day  . HYDROcodone-acetaminophen  2 tablet Oral Once  . lamoTRIgine  200 mg Oral Daily  . levothyroxine  88 mcg Oral QAC breakfast  . magnesium oxide  400 mg Per Tube BID  . metoprolol tartrate   12.5 mg Oral BID  . ondansetron (ZOFRAN) IV  4 mg Intravenous 4 times per day  . pantoprazole sodium  40 mg Per Tube BID  . QUEtiapine  100 mg Oral BID  . saccharomyces boulardii  250 mg Oral BID  . sucralfate  1 g Oral TID AC & HS    Continuous Infusions: . feeding supplement (VITAL AF 1.2 CAL) 1,000 mL (12/05/13 1522)  . heparin 1,800 Units/hr (12/06/13 2334)     Inda Coke MS, RD, LDN Inpatient Registered Dietitian Pager: [phone] After-hours pager: [phone]

## 2013-12-07 NOTE — Progress Notes (Signed)
[facility] PROGRESS NOTE  [name] [id number] DOB: 1962-03-15 DOA: 11/11/2013 PCP: [name], MD  Assessment/Plan:  Acute respiratory failure:  -Secondary to aspiration pneumonia.  -clinically improved -Continue NG tube -3/17 patient  S/P fluoroscopic placement of a Dobhoff tube  -Respiratory failure has resolved since placement of Dobhoff tube -3/30 patient sitting in chair without any signs or symptoms of respiratory distress  Pulmonary hypertension -3/17 BP slightly outside AHA guidelines continue metoprolol 12.5 mg BID -3/26 BP within AHA guidelines continue metoprolol 12.5 mg BID  -3/30 BP within AHA guidelines no changes to her regimen recommended   Upper GI bleed: -Patient has significant pathology; esophagitis, duodenal ulcer, duodenitis. See EGD report below -Continue All medication to be administered via Dobhoff tube (carafate, PPI po bid, bid pepcid, florastor) -3/30 Spoke at length with Dr. Barbie Banner (interventional radiologist) and at this time feels risk is too high to place J-tube. -In a.m. reconsult GI to inform them of obtaining placement of J-tube  Left upper extremity DVT 3/20 - Patient is currently on heparin and will bridge to Coumadin once issue of J-tube placement resolved    Aspiration Pneumonia:  -Patient made n.p.o. secondary to refractory N./V. and increased risk of aspirating. -Patient has been without nutrition since admission on 3/1 consult nutrition and pharmacy for NG tube feeding. -3/16 unable to continue NG tube feeding secondary to large residuals. Will need to place Dobhoff tube in the a.m Ensuring termination in the duodenum  -3/17 S/P IR placement of Dobhoff tube with termination in the duodenum -3/17 Start enteral feedings via Dobhoff, using enteral feeds for non- ICU patient protocol. -3/25 patient was tried on clear liquids and dysphagia diet and both have caused patient extreme episodes of N./V. DC any PO nutrition  and restart feedings per Dobhoff tube. Dietitian to manage  -3/26 reconsulted [facility] to evaluate patient for placement of J-tube  Dysphagia:  -Patient has significant pathology; esophagitis, duodenal ulcer, duodenitis. See EGD report below -Patient has been without nutrition since admission on 3/1 consult nutrition and pharmacy for NG tube feeding. -3/16 ASAP order placed for fluoroscopically guided placement of Dobhoff feeding to placement into the duodenum. -3./17 Dobhoff tube successfully place -3/25 patient has been unable to tolerate clear liquid diet; resulted in gagging/nausea/vomiting. Patient to be made n.p.o. and resume tube feedings Osmolite 1.2 CAL dietitian to manage -Discussed placement of PEG tube since patient has been tried numerous occasions PO feedings and failed will discuss with her family -3/26 reconsulted [facility] to evaluate patient for placement of J-tube -3/30 Spoke at length with Dr. Barbie Banner (interventional radiologist) and at this time feels risk is too high to place J-tube  Depression  -continue Lexapro, Wellbutrin, via  NG tube   Hypothyroidism:  -Synthroid 88 mcg per NG tube    Hyperlipidemia:  -Stable.  -lipid panel within NCEP guidelines   Hypertension:  -BP not within AHA guidelines however on metoprolol 12.5 mg BID, just slightly outside guidelines and would not make changes at this to    C. difficile colitis:  -Continue Flagyl+ PO vancomycin. Will Consider 3/13 as first full day of treatment (3/13 NG tube placed and patient able to keep all medication down) -CT scan abdomen and pelvis completed; significant pathology to include possible neoplasm see results below.  -Dr. Delfin Edis (GI) conducted Colonoscopy on 3/15, which showed significant pathology see report below, awaiting pathology reports -Please contact daughter [name]  [phone] with results of pathology report -3/26 review of patient's [facility] shows  that metronidazole and vancomycin  stopped on 3/21, on 3/23 patient began to have watery diarrhea again. -3/28 C. difficile by PCR negative  Hx psychosis: -Likely being off of Seroquel, Lamictal and Lexapro for almost one week may be contributing factor to decreased me ntation and worsening swallowing. Continue via Dobhoff tube  Hypernatremia:  - Resolved -3/25 restart D5 1/2 normal saline 125 ml/hr, until patient tolerating enteral feeds via Dobhoff tube. -Check CMP in the a.m.   Hypokalemia  -3/14 replace k 6 runs secondary to refractory N./V., and patient having bowel prep for colonoscopy this evening   -3/15 replace potassium 10 meq x6 runs secondary to loss through NG tube/bowel preparation for her colonoscopy -3/16 replace potassium 10 meq x 5 runs -3/17 replace potassium 66meq x6 runs -3/25 replace potassium 33meq x 5 runs -3/26 replace potassium 30meq x 5 runs -Recheck in the a.m. . If continues to be problem reconsult nutrition/pharmacy to add potassium to enteral feeds   Hypomagnesemia:  -3/16  replete magnesium 3 gm IV  -3/25 replete magnesium 4 gm IV -3/26 replete magnesium 400 mg x1 via Dobhoff -3/26 continue magnesium oxide via Dobhoff BID  Nausea vomiting  -NG tube placed 3/13 -Most likely secondary to gastroparesis + patient significant pathology i.e. sophagitis/duodenitis/duodenal ulcer.  -Continue scheduled Zofran  -Thorazine 25 mg BID; after patient tolerating enteral feeds consider changing to PRN/DC -3/25 DC Reglan 5 mg QID secondary to watery diarrhea starting 3/23 -3/30 repeat C. difficile by PCR on 3/28 negative       Code Status: Full code  Family Communication: None  Disposition Plan: Resolution of refractory N./V.; Physical therapy recommending skilled nursing, however patient may need LTAC placement given her possible PEG placement     Consultants: Dr. Delfin Edis (GI) Dr. Barbie Banner (interventional radiologist)     Procedures: Left upper extremity venous duplex on  11/27/2013 Consistent with acute deep and superficialvein thrombosis involving the left subclavian vein, left axillary vein, left brachial vein, and left cephalic vein.   Colonoscopy with cold biopsy polypectomy and Colonoscopy with biopsy 11/15/2013 -Two sessile polyps ranging between 3-82mm in size were found in the descending colon; polypectomy was performed with cold forceps  -There was mild diverticulosis noted in the sigmoid colon  - .diffuse nonspecific colitis throughout the lef,t transverse and right colon consistent with resolving colitis, status post random biopsies ,no evidence of pseudomembrane  - Cecal ulcerations likely resolving colitis. Biopsies taken to rule out ischemic etiology versus pseudomembranous colitis    Abdomen pelvis with contrast 11/20/2013 -Interval progression of now diffuse colonic wall thickening, surrounding stranding, and pericolonic fluid tracking to the pelvis. Findings are most suggestive of infectious or inflammatory colitis  such as may be seen with Clostridium difficile infection, ischemia, or less likely inflammatory bowel disease. No free air to suggest perforation.  -A polypoid intraluminal masslike filling defect is noted level of the rectum which could represent malignancy such as colon cancer, although oval inpouching related to wall thickening could  appear similar. After the patient's current symptoms resolve, colonoscopy or sigmoidoscopy is recommended for further evaluation at direct visualization.  -New moderate right pleural effusion with right greater than left lower lobe atelectasis.  -New amorphous hyperdensity within the right lower lobe, which could indicate aspiration.   EGD 12/06/2013 -severe grade 4 reflux esophagitis status post biopsies  -Mild gastric retention  -Small duodenal ulcer without stigmata of bleeding  -Moderately severe duodenitis status post biopsies to rule out H. Pylori   Left PICC/Midline 3/13>>  stopped  3/20  NG tube 3/13>>>  Intubation 3/2-3/5   NG tube 3/2-3/5   Right IJ central line 3/1-3/6  Echocardiogram 11/09/2013 - Left ventricle: The cavity size was normal. mild LVH.  -LVEF; 55% to 60%. - Atrial septum: No defect or patent foramen ovale was identified. - Pulmonary arteries: PA peak pressure: 31mm Hg (S).    Antibiotics: IV Zosyn 3/1-stopped 3/10 IV Flagyl 3/7>> stopped 3/21 PO vancomycin 3/7>> stopped 3/21   HPI/Subjective: 62yo BF PMHx  hypertension, hypothyroidism and psychosis who was discharged on 2/25 after hospitalization for C. difficile colitis and we admitted on 3/1 for acute respiratory failure secondary to pneumonia. She was admitted to the critical care service and intubated. Echocardiogram checked and found to be unremarkable. Patient started on broad-spectrum antibiotics. By 3/5, she was able to be extubated and transferred to the hospitalist service in the step down unit starting 3/7. Trace pitting by speech therapy who initially recommended downgrading her diet to dysphagia with nectar thick liquids, however followup evaluation in 3/7 noted further downgraded to n.p.o. with meds only. Patient has been getting slightly more concentrated with increase in her sodium which was 150 by 3/7 and started on gentle half normal saline. C. difficile cultures repeated and patient still positive. Still having some diarrhea. 3/11 patient states can not keep her vancomycin down. However talking to her RN when patient does not know she is taking vancomycin able to keep medication down. Currently n.p.o. scheduled for EGD today. 3/12 patient obtain EGD yesterday which showed significant pathology see report below. 3/13 patient feeling improved, negative N./V. since placement of NG tube 3/14 patient with only mild episodes of nausea, states she understands the plan outlined by Dr. Olevia Perches (GI) concerning the need for colonoscopy 3/15 patient states her N./V. has resolved the NG tube  placement. Patient has tolerated contrast for CT scan, medications, and bowel prep for colonoscopy. 3/17 patient sleepy but arousable S/P placement of Dobhoff tube. Request that daughter [name]  [phone] be brought up-to-date on plan of care 3/25 patient verified that she has been unable to handle any PO nutrition without consistently having severe N./V. 3/26 patient states spoke with family about needing to have a J-tube placed. I again reassured her that this would be most likely a temporary measure, and patient agreed to allow me to reconsult GI 3/30 patient sitting in chair is in good spirits concerning obtaining J-tube.   Objective: Filed Vitals:   12/07/13 0410 12/07/13 0433 12/07/13 0844 12/07/13 1117  BP: 124/79  134/85 132/74  Pulse: 83  91 88  Temp: 98.3 F (36.8 C)  99 F (37.2 C) 99.4 F (37.4 C)  TempSrc: Oral  Oral Oral  Resp: 18  20 18   Height:      Weight:  115.667 kg (255 lb)    SpO2: 94%  92% 95%    Intake/Output Summary (Last 24 hours) at 12/07/13 1215 Last data filed at 12/06/13 2300  Gross per 24 hour  Intake   1323 ml  Output   1000 ml  Net    323 ml   Filed Weights   12/05/13 2015 12/06/13 2009 12/07/13 0433  Weight: 117.981 kg (260 lb 1.6 oz) 115.667 kg (255 lb) 115.667 kg (255 lb)    Exam:   General:  A./O. x4, NAD     Cardiovascular: Regular rhythm and rate,, negative murmurs rubs gallops  Respiratory: Clear to auscultation bilateral  Abdomen: Obese, mild right lower quadrant tenderness to palpation, plus bowel  sound (hypoactive)  Musculoskeletal: Positive 1+plus pedal edema bilateral to knee   Data Reviewed: Basic Metabolic Panel:  Recent Labs Lab 12/02/13 0432 12/03/13 0534 12/04/13 0847 12/05/13 0530 12/06/13 0500 12/07/13 0641  NA 140 140 144 141 142 143  K 2.8* 2.9* 3.3* 3.5* 3.3* 3.9  CL 103 105 107 106 105 106  CO2 23 24 28 26 27 28   GLUCOSE 75 117* 95 101* 90 98  BUN 7 5* 3* 4* 4* 6  CREATININE 0.98 0.95 0.92 0.93 0.84  0.81  CALCIUM 7.6* 7.8* 8.0* 7.7* 8.1* 8.3*  MG 1.1* 1.8  --  1.4* 1.3* 1.6  PHOS  --  3.5 3.1 2.9  --   --    Liver Function Tests:  Recent Labs Lab 12/03/13 0534 12/04/13 0847 12/05/13 0530 12/06/13 0500 12/07/13 0641  AST 13 13 12 14 17   ALT 7 6 6 6 8   ALKPHOS 59 68 63 62 64  BILITOT <0.2* <0.2* <0.2* <0.2* <0.2*  PROT 5.2* 5.3* 5.0* 5.2* 5.5*  ALBUMIN 1.9* 1.9* 1.7* 1.8* 1.9*   No results found for this basename: LIPASE, AMYLASE,  in the last 168 hours No results found for this basename: AMMONIA,  in the last 168 hours CBC:  Recent Labs Lab 12/03/13 0534 12/04/13 0847 12/05/13 0530 12/06/13 0500 12/07/13 0641  WBC 5.6 5.9 6.0 6.6 6.6  NEUTROABS 2.9 3.1 2.4 3.0 3.1  HGB 8.0* 8.0* 7.8* 8.1* 8.1*  HCT 24.0* 23.6* 23.1* 23.7* 24.8*  MCV 90.9 91.5 91.7 91.2 92.2  PLT 324 342 322 351 390   Cardiac Enzymes: No results found for this basename: CKTOTAL, CKMB, CKMBINDEX, TROPONINI,  in the last 168 hours BNP (last 3 results)  Recent Labs  12/06/2013 1512  PROBNP 1572.0*   CBG:  Recent Labs Lab 12/06/13 1630 12/06/13 2001 12/07/13 0005 12/07/13 0408 12/07/13 0841  GLUCAP 78 97 85 90 98    Recent Results (from the past 240 hour(s))  CLOSTRIDIUM DIFFICILE BY PCR     Status: None   Collection Time    12/05/13  8:11 PM      Result Value Ref Range Status   C difficile by pcr NEGATIVE  NEGATIVE Final     Studies: No results found.  Scheduled Meds: . buPROPion  75 mg Per Tube BID  . chlorpheniramine-HYDROcodone  5 mL Per Tube Q12H  . escitalopram  20 mg Oral Daily  . guaiFENesin  15 mL Per Tube 4 times per day  . HYDROcodone-acetaminophen  2 tablet Oral Once  . lamoTRIgine  200 mg Oral Daily  . levothyroxine  88 mcg Oral QAC breakfast  . magnesium oxide  400 mg Per Tube BID  . metoprolol tartrate  12.5 mg Oral BID  . ondansetron (ZOFRAN) IV  4 mg Intravenous 4 times per day  . pantoprazole sodium  40 mg Per Tube BID  . QUEtiapine  100 mg Oral BID  .  saccharomyces boulardii  250 mg Oral BID  . sucralfate  1 g Oral TID AC & HS   Continuous Infusions: . feeding supplement (VITAL AF 1.2 CAL) 1,000 mL (12/05/13 1522)  . heparin 1,800 Units/hr (12/06/13 2334)    Principal Problem:   Aspiration pneumonia Active Problems:   Severe Clostridium difficile enterocolitis   Acute respiratory failure   Hematemesis   Dysphagia, pharyngoesophageal phase   H/O psychosis   Pulmonary hypertension   Duodenal ulcer, acute   Reflux esophagitis   Other and unspecified noninfectious gastroenteritis and  colitis(558.9)   Ulceration of intestine   Benign neoplasm of colon   DVT (deep venous thrombosis)    Time spent: 40 minute   Rheanne Cortopassi, [place], J  [facility] Pager [phone]. If 7PM-7AM, please contact night-coverage at [email or web address], password [facility] 12/07/2013, 12:15 PM  LOS: 29 days

## 2013-12-07 NOTE — Clinical Social Work Note (Addendum)
CSW talked with Narda Rutherford, admissions director of [facility] skilled facility to provide update on patient. When asked if they can accept patient with Dobhoff feeding tube, she responded they cannot. CSW also contacted 2 other facilities, [facility] and [facility] and they also do not take patient's with the type of feeding tube that patient has.  Nataliyah Packham Givens, MSW, LCSW [phone]

## 2013-12-07 NOTE — Progress Notes (Signed)
ANTICOAGULATION CONSULT NOTE - Follow Up Consult  Pharmacy Consult for Heparin (Coumadin on hold) Indication: DVT  Allergies  Allergen Reactions  . Aspirin Rash    Patient Measurements: Height: 5\' 6"  (167.6 cm) Weight: 255 lb (115.667 kg) IBW/kg (Calculated) : 59.3 Heparin Dosing Weight: 90 kg  Vital Signs: Temp: 99.1 F (37.3 C) (03/30 1715) Temp src: Oral (03/30 1715) BP: 139/81 mmHg (03/30 1715) Pulse Rate: 87 (03/30 1715)  Labs:  Recent Labs  12/05/13 0530 12/06/13 0500  12/06/13 2225 12/07/13 0641 12/07/13 1640  HGB 7.8* 8.1*  --   --  8.1*  --   HCT 23.1* 23.7*  --   --  24.8*  --   PLT 322 351  --   --  390  --   LABPROT 19.7* 18.5*  --   --  15.8*  --   INR 1.72* 1.59*  --   --  1.29  --   HEPARINUNFRC 0.51 0.18*  < > 0.73* 0.76* 0.82*  CREATININE 0.93 0.84  --   --  0.81  --   < > = values in this interval not displayed.  Estimated Creatinine Clearance: 93.1 ml/min (by C-G formula based on Cr of 0.81).   Medications:  Heparin at 1700 units/hr  Assessment: 63 year old female on anticoagulation with Heparin for a DVT.  Coumadin is currently on hold for PEG tube placement.  Her heparin level remains supratherapeutic despite rate decreases.  Discussed with RN - she decreased the rate this morning although this is not charted.    Goal of Therapy:  Heparin level 0.3-0.7 units/ml Monitor platelets by anticoagulation protocol: Yes   Plan:  Decrease Heparin to 1500 units/hr Check Heparin level in 8 hours.  Legrand Como, Pharm.D., BCPS, AAHIVP Clinical Pharmacist Phone: [phone] or [phone] 12/07/2013, 5:49 PM

## 2013-12-08 LAB — COMPREHENSIVE METABOLIC PANEL
ALBUMIN: 1.9 g/dL — AB (ref 3.5–5.2)
ALT: 8 U/L (ref 0–35)
AST: 15 U/L (ref 0–37)
Alkaline Phosphatase: 63 U/L (ref 39–117)
BUN: 7 mg/dL (ref 6–23)
CALCIUM: 8.4 mg/dL (ref 8.4–10.5)
CO2: 26 mEq/L (ref 19–32)
Chloride: 104 mEq/L (ref 96–112)
Creatinine, Ser: 0.81 mg/dL (ref 0.50–1.10)
GFR calc Af Amer: 88 mL/min — ABNORMAL LOW (ref >90)
GFR calc non Af Amer: 76 mL/min — ABNORMAL LOW (ref >90)
Glucose, Bld: 95 mg/dL (ref 70–99)
Potassium: 3.7 mEq/L (ref 3.7–5.3)
SODIUM: 141 meq/L (ref 137–147)
TOTAL PROTEIN: 5.4 g/dL — AB (ref 6.0–8.3)
Total Bilirubin: <0.2 mg/dL — ABNORMAL LOW (ref 0.3–1.2)

## 2013-12-08 LAB — CBC
HCT: 24.5 % — ABNORMAL LOW (ref 36.0–46.0)
HEMOGLOBIN: 8.1 g/dL — AB (ref 12.0–15.0)
MCH: 30.7 pg (ref 26.0–34.0)
MCHC: 33.1 g/dL (ref 30.0–36.0)
MCV: 92.8 fL (ref 78.0–100.0)
Platelets: 356 10*3/uL (ref 150–400)
RBC: 2.64 MIL/uL — ABNORMAL LOW (ref 3.87–5.11)
RDW: 22.7 % — AB (ref 11.5–15.5)
WBC: 7.3 10*3/uL (ref 4.0–10.5)

## 2013-12-08 LAB — HEPARIN LEVEL (UNFRACTIONATED)
HEPARIN UNFRACTIONATED: 0.56 [IU]/mL (ref 0.30–0.70)
Heparin Unfractionated: 0.87 [IU]/mL — ABNORMAL HIGH (ref 0.30–0.70)
Heparin Unfractionated: 0.33 [IU]/mL (ref 0.30–0.70)

## 2013-12-08 LAB — MAGNESIUM: MAGNESIUM: 1.4 mg/dL — AB (ref 1.5–2.5)

## 2013-12-08 LAB — GLUCOSE, CAPILLARY
Glucose-Capillary: 106 mg/dL — ABNORMAL HIGH (ref 70–99)
Glucose-Capillary: 81 mg/dL (ref 70–99)
Glucose-Capillary: 90 mg/dL (ref 70–99)
Glucose-Capillary: 91 mg/dL (ref 70–99)
Glucose-Capillary: 91 mg/dL (ref 70–99)
Glucose-Capillary: 93 mg/dL (ref 70–99)

## 2013-12-08 LAB — PROTIME-INR
INR: 1.11 (ref 0.00–1.49)
Prothrombin Time: 14.1 seconds (ref 11.6–15.2)

## 2013-12-08 NOTE — Progress Notes (Addendum)
ANTICOAGULATION CONSULT NOTE - Follow Up Consult  Pharmacy Consult for Heparin (Coumadin on hold) Indication: DVT  Allergies  Allergen Reactions  . Aspirin Rash    Patient Measurements: Height: 5\' 6"  (167.6 cm) Weight: 263 lb 14.4 oz (119.704 kg) IBW/kg (Calculated) : 59.3 Heparin Dosing Weight: 90 kg  Vital Signs: Temp: 98.9 F (37.2 C) (03/31 0822) Temp src: Oral (03/31 0822) BP: 137/74 mmHg (03/31 0822) Pulse Rate: 92 (03/31 0822)  Labs:  Recent Labs  12/06/13 0500  12/07/13 0641 12/07/13 1640 12/08/13 0715  HGB 8.1*  --  8.1*  --  8.1*  HCT 23.7*  --  24.8*  --  24.5*  PLT 351  --  390  --  356  LABPROT 18.5*  --  15.8*  --  14.1  INR 1.59*  --  1.29  --  1.11  HEPARINUNFRC 0.18*  < > 0.76* 0.82* 0.56  CREATININE 0.84  --  0.81  --  0.81  < > = values in this interval not displayed.  Estimated Creatinine Clearance: 94.9 ml/min (by C-G formula based on Cr of 0.81).   Medications:  Heparin at 1500 units/hr  Assessment: 63 year old female on anticoagulation with Heparin for a DVT.  Coumadin is currently on hold for PEG tube placement.  Her heparin level is therapeutic x1.  Will check confirmatory heparin level this afternoon as pt is s/p recent GI bleed and has had fluctuating heparin levels.  Hgb remains low but stable, no s/sx bleeding at this time.   Goal of Therapy:  Heparin level 0.3-0.7 units/ml Monitor platelets by anticoagulation protocol: Yes   Plan:  Continue heparin at 1500 units/hr Next HL at 1300 Daily CBC, Heparin levels F/u resumption of warfarin post PEG tube insertion  Hughes Better, PharmD, BCPS Clinical Pharmacist Pager: [phone] 12/08/2013 10:42 AM   Addendum Heparin level this afternoon is supratherapeutic Decreasing heparin gtt to 1300 units/hr  Check another level at 2130  Hughes Better, PharmD, BCPS Clinical Pharmacist Pager: [phone] 12/08/2013 3:39 PM

## 2013-12-08 NOTE — Progress Notes (Addendum)
[facility] PROGRESS NOTE  [name] [id number] DOB: 30-Jul-1951 DOA: 12/05/2013 PCP: [name], MD  Assessment/Plan:  Acute respiratory failure:  -Secondary to aspiration pneumonia.  -clinically improved -Continue NG tube -3/17 patient  S/P fluoroscopic placement of a Dobhoff tube  -Respiratory failure has resolved since placement of Dobhoff tube -3/30 patient sitting in chair without any signs or symptoms of respiratory distress -3/31 patient's respiratory status has significantly improved, no longer requiring any O2   Pulmonary hypertension -3/17 BP slightly outside AHA guidelines continue metoprolol 12.5 mg BID -3/26 BP within AHA guidelines continue metoprolol 12.5 mg BID  -3/30 BP within AHA guidelines no changes to her regimen recommended -3/31 BP within AHA guidelines or just slightly above normal medication changes recommended   Upper GI bleed: -Patient has significant pathology; esophagitis, duodenal ulcer, duodenitis. See EGD report below -Continue All medication to be administered via Dobhoff tube (carafate, PPI po bid, bid pepcid, florastor) -3/30 Spoke at length with Dr. Barbie Banner (interventional radiologist) and at this time feels risk is too high to place J-tube. -3/31 In a.m. reconsult GI to inform them of obtaining placement of J-tube  Left upper extremity DVT 3/20 - Patient is currently on heparin and will bridge to Coumadin once issue of J-tube placement resolved -See upper GI bleed    Aspiration Pneumonia:  -Patient made n.p.o. secondary to refractory N./V. and increased risk of aspirating. -Patient has been without nutrition since admission on 3/1 consult nutrition and pharmacy for NG tube feeding. -3/16 unable to continue NG tube feeding secondary to large residuals. Will need to place Dobhoff tube in the a.m Ensuring termination in the duodenum  -3/17 S/P IR placement of Dobhoff tube with termination in the duodenum -3/17 Start enteral  feedings via Dobhoff, using enteral feeds for non- ICU patient protocol. -3/25 patient was tried on clear liquids and dysphagia diet and both have caused patient extreme episodes of N./V. DC any PO nutrition and restart feedings per Dobhoff tube. Dietitian to manage  -3/26 reconsulted [facility] to evaluate patient for placement of J-tube -3/31 patient still unable to tolerate by mouth nutrition will reconsult GI and hopefully they will agree for J-tube placement.; This is the only issue holding patient in the hospital  Dysphagia:  -Patient has significant pathology; esophagitis, duodenal ulcer, duodenitis. See EGD report below -Patient has been without nutrition since admission on 3/1 consult nutrition and pharmacy for NG tube feeding. -3/16 ASAP order placed for fluoroscopically guided placement of Dobhoff feeding to placement into the duodenum. -3./17 Dobhoff tube successfully place -3/25 patient has been unable to tolerate clear liquid diet; resulted in gagging/nausea/vomiting. Patient to be made n.p.o. and resume tube feedings Osmolite 1.2 CAL dietitian to manage -Discussed placement of PEG tube since patient has been tried numerous occasions PO feedings and failed will discuss with her family -3/26 reconsulted [facility] to evaluate patient for placement of J-tube -3/30 Spoke at length with Dr. Barbie Banner (interventional radiologist) and at this time feels risk is too high to place J-tube  Depression  -continue Lexapro, Wellbutrin, via  NG tube   Hypothyroidism:  -Synthroid 88 mcg per NG tube    Hyperlipidemia:  -Stable.  -lipid panel within NCEP guidelines   Hypertension:  -BP not within AHA guidelines however on metoprolol 12.5 mg BID, just slightly outside guidelines and would not make changes at this time    C. difficile colitis:  -Continue Flagyl+ PO vancomycin. Will Consider 3/13 as first full day of treatment (3/13 NG  tube placed and patient able to keep all medication down) -CT  scan abdomen and pelvis completed; significant pathology to include possible neoplasm see results below.  -Dr. Delfin Edis (GI) conducted Colonoscopy on 3/15, which showed significant pathology see report below, awaiting pathology reports -Please contact daughter [name]  [phone] with results of pathology report -3/26 review of patient's MAR shows that metronidazole and vancomycin stopped on 3/21, on 3/23 patient began to have watery diarrhea again. -3/28 C. difficile by PCR negative -3/31 pathology from colonoscopy on 3/15 and showed no malignancy see report below  Hx psychosis: -Likely being off of Seroquel, Lamictal and Lexapro for almost one week may be contributing factor to decreased me ntation and worsening swallowing. Continue via Dobhoff tube  Hypernatremia:  - Resolved -3/25 restart D5 1/2 normal saline 125 ml/hr, until patient tolerating enteral feeds via Dobhoff tube. -Check CMP in the a.m.   Hypokalemia  -3/14 replace k 6 runs secondary to refractory N./V., and patient having bowel prep for colonoscopy this evening   -3/15 replace potassium 10 meq x6 runs secondary to loss through NG tube/bowel preparation for her colonoscopy -3/16 replace potassium 10 meq x 5 runs -3/17 replace potassium 38meq x6 runs -3/25 replace potassium 4meq x 5 runs -3/26 replace potassium 68meq x 5 runs -Recheck in the a.m. . If continues to be problem reconsult nutrition/pharmacy to add potassium to enteral feeds   Hypomagnesemia:  -3/16  replete magnesium 3 gm IV  -3/25 replete magnesium 4 gm IV -3/26 replete magnesium 400 mg x1 via Dobhoff -3/26 continue magnesium oxide via Dobhoff BID  Nausea vomiting  -NG tube placed 3/13 -Most likely secondary to gastroparesis + patient significant pathology i.e. sophagitis/duodenitis/duodenal ulcer.  -Continue scheduled Zofran  -Thorazine 25 mg BID; after patient tolerating enteral feeds consider changing to PRN/DC -3/25 DC Reglan 5 mg QID secondary  to watery diarrhea starting 3/23 -3/30 repeat C. difficile by PCR on 3/28 negative  Nutrition -Currently Vital AF at 60 ml/hr via Dobhoff tube.      Code Status: Full code  Family Communication: None  Disposition Plan: Resolution of refractory N./V.; Physical therapy recommending skilled nursing, however patient may need LTAC placement given her possible PEG placement     Consultants: Dr. Delfin Edis (GI) Dr. Barbie Banner (interventional radiologist)     Procedures: 3/15/ 2015 colonoscopy pathology results 1. Colon, biopsy, Right Ascending - BENIGN COLONIC MUCOSA WITH FOCAL ISCHEMIC-LIKE CHANGES. - THERE IS NO EVIDENCE OF IDIOPATHIC INFLAMMATORY BOWEL DISEASE, DYSPLASIA OR MALIGNANCY. 2. Colon, biopsy, Left Descending - BENIGN COLONIC MUCOSA WITH FOCAL ISCHEMIC-LIKE CHANGES. - THERE IS NO EVIDENCE OF IDIOPATHIC INFLAMMATORY BOWEL DISEASE, DYSPLASIA OR MALIGNANCY. 3. Colon, polyp(s), Left Descending at 50cm - TUBULAR ADENOMA(S). - HIGH GRADE DYSPLASIA IS NOT IDENTIFIED.   Left upper extremity venous duplex on 11/27/2013 Consistent with acute deep and superficialvein thrombosis involving the left subclavian vein, left axillary vein, left brachial vein, and left cephalic vein.   Colonoscopy with cold biopsy polypectomy and Colonoscopy with biopsy 12/08/2013 -Two sessile polyps ranging between 3-67mm in size were found in the descending colon; polypectomy was performed with cold forceps  -There was mild diverticulosis noted in the sigmoid colon  - .diffuse nonspecific colitis throughout the lef,t transverse and right colon consistent with resolving colitis, status post random biopsies ,no evidence of pseudomembrane  - Cecal ulcerations likely resolving colitis. Biopsies taken to rule out ischemic etiology versus pseudomembranous colitis    Abdomen pelvis with contrast 11/20/2013 -Interval progression of now diffuse colonic  wall thickening, surrounding stranding, and pericolonic fluid  tracking to the pelvis. Findings are most suggestive of infectious or inflammatory colitis  such as may be seen with Clostridium difficile infection, ischemia, or less likely inflammatory bowel disease. No free air to suggest perforation.  -A polypoid intraluminal masslike filling defect is noted level of the rectum which could represent malignancy such as colon cancer, although oval inpouching related to wall thickening could  appear similar. After the patient's current symptoms resolve, colonoscopy or sigmoidoscopy is recommended for further evaluation at direct visualization.  -New moderate right pleural effusion with right greater than left lower lobe atelectasis.  -New amorphous hyperdensity within the right lower lobe, which could indicate aspiration.   EGD 12/08/2013 -severe grade 4 reflux esophagitis status post biopsies  -Mild gastric retention  -Small duodenal ulcer without stigmata of bleeding  -Moderately severe duodenitis status post biopsies to rule out H. Pylori   Left PICC/Midline 3/13>> stopped 3/20  NG tube 3/13>>>  Intubation 3/2-3/5   NG tube 3/2-3/5   Right IJ central line 3/1-3/6  Echocardiogram 11/09/2013 - Left ventricle: The cavity size was normal. mild LVH.  -LVEF; 55% to 60%. - Atrial septum: No defect or patent foramen ovale was identified. - Pulmonary arteries: PA peak pressure: 61mm Hg (S).    Antibiotics: IV Zosyn 3/1-stopped 3/10 IV Flagyl 3/7>> stopped 3/21 PO vancomycin 3/7>> stopped 3/21   HPI/Subjective: 62yo BF PMHx  hypertension, hypothyroidism and psychosis who was discharged on 2/25 after hospitalization for C. difficile colitis and we admitted on 3/1 for acute respiratory failure secondary to pneumonia. She was admitted to the critical care service and intubated. Echocardiogram checked and found to be unremarkable. Patient started on broad-spectrum antibiotics. By 3/5, she was able to be extubated and transferred to the hospitalist service  in the step down unit starting 3/7. Trace pitting by speech therapy who initially recommended downgrading her diet to dysphagia with nectar thick liquids, however followup evaluation in 3/7 noted further downgraded to n.p.o. with meds only. Patient has been getting slightly more concentrated with increase in her sodium which was 150 by 3/7 and started on gentle half normal saline. C. difficile cultures repeated and patient still positive. Still having some diarrhea. 3/11 patient states can not keep her vancomycin down. However talking to her RN when patient does not know she is taking vancomycin able to keep medication down. Currently n.p.o. scheduled for EGD today. 3/12 patient obtain EGD yesterday which showed significant pathology see report below. 3/13 patient feeling improved, negative N./V. since placement of NG tube 3/14 patient with only mild episodes of nausea, states she understands the plan outlined by Dr. Olevia Perches (GI) concerning the need for colonoscopy 3/15 patient states her N./V. has resolved the NG tube placement. Patient has tolerated contrast for CT scan, medications, and bowel prep for colonoscopy. 3/17 patient sleepy but arousable S/P placement of Dobhoff tube. Request that daughter [name]  [phone] be brought up-to-date on plan of care 3/25 patient verified that she has been unable to handle any PO nutrition without consistently having severe N./V. 3/26 patient states spoke with family about needing to have a J-tube placed. I again reassured her that this would be most likely a temporary measure, and patient agreed to allow me to reconsult GI 3/30 patient sitting in chair is in good spirits concerning obtaining J-tube.   Objective: Filed Vitals:   12/08/13 0822 12/08/13 1254 12/08/13 1724 12/08/13 2016  BP: 137/74 125/65 140/87 138/75  Pulse: 92 86 93  86  Temp: 98.9 F (37.2 C) 98.8 F (37.1 C) 99.3 F (37.4 C) 99.3 F (37.4 C)  TempSrc: Oral Oral Oral Oral  Resp: 18 18 18 18    Height:      Weight:    117.845 kg (259 lb 12.8 oz)  SpO2: 96% 93% 96% 96%    Intake/Output Summary (Last 24 hours) at 12/08/13 2046 Last data filed at 12/08/13 1720  Gross per 24 hour  Intake 846.25 ml  Output   1552 ml  Net -705.75 ml   Filed Weights   12/07/13 0433 12/07/13 2121 12/08/13 2016  Weight: 115.667 kg (255 lb) 119.704 kg (263 lb 14.4 oz) 117.845 kg (259 lb 12.8 oz)    Exam:   General:  A./O. x4, NAD     Cardiovascular: Regular rhythm and rate,, negative murmurs rubs gallops  Respiratory: Clear to auscultation bilateral  Abdomen: Obese, mild right lower quadrant tenderness to palpation, plus bowel sound (hypoactive)  Musculoskeletal: Positive 1+plus pedal edema bilateral to knee   Data Reviewed: Basic Metabolic Panel:  Recent Labs Lab 12/03/13 0534 12/04/13 0847 12/05/13 0530 12/06/13 0500 12/07/13 0641 12/08/13 0715  NA 140 144 141 142 143 141  K 2.9* 3.3* 3.5* 3.3* 3.9 3.7  CL 105 107 106 105 106 104  CO2 24 28 26 27 28 26   GLUCOSE 117* 95 101* 90 98 95  BUN 5* 3* 4* 4* 6 7  CREATININE 0.95 0.92 0.93 0.84 0.81 0.81  CALCIUM 7.8* 8.0* 7.7* 8.1* 8.3* 8.4  MG 1.8  --  1.4* 1.3* 1.6 1.4*  PHOS 3.5 3.1 2.9  --   --   --    Liver Function Tests:  Recent Labs Lab 12/04/13 0847 12/05/13 0530 12/06/13 0500 12/07/13 0641 12/08/13 0715  AST 13 12 14 17 15   ALT 6 6 6 8 8   ALKPHOS 68 63 62 64 63  BILITOT <0.2* <0.2* <0.2* <0.2* <0.2*  PROT 5.3* 5.0* 5.2* 5.5* 5.4*  ALBUMIN 1.9* 1.7* 1.8* 1.9* 1.9*   No results found for this basename: LIPASE, AMYLASE,  in the last 168 hours No results found for this basename: AMMONIA,  in the last 168 hours CBC:  Recent Labs Lab 12/03/13 0534 12/04/13 0847 12/05/13 0530 12/06/13 0500 12/07/13 0641 12/08/13 0715  WBC 5.6 5.9 6.0 6.6 6.6 7.3  NEUTROABS 2.9 3.1 2.4 3.0 3.1  --   HGB 8.0* 8.0* 7.8* 8.1* 8.1* 8.1*  HCT 24.0* 23.6* 23.1* 23.7* 24.8* 24.5*  MCV 90.9 91.5 91.7 91.2 92.2 92.8  PLT 324  342 322 351 390 356   Cardiac Enzymes: No results found for this basename: CKTOTAL, CKMB, CKMBINDEX, TROPONINI,  in the last 168 hours BNP (last 3 results)  Recent Labs  11/25/2013 1512  PROBNP 1572.0*   CBG:  Recent Labs Lab 12/08/13 0001 12/08/13 0456 12/08/13 0824 12/08/13 1235 12/08/13 1724  GLUCAP 91 93 106* 90 81    Recent Results (from the past 240 hour(s))  CLOSTRIDIUM DIFFICILE BY PCR     Status: None   Collection Time    12/05/13  8:11 PM      Result Value Ref Range Status   C difficile by pcr NEGATIVE  NEGATIVE Final     Studies: No results found.  Scheduled Meds: . buPROPion  75 mg Per Tube BID  . chlorpheniramine-HYDROcodone  5 mL Per Tube Q12H  . escitalopram  20 mg Oral Daily  . guaiFENesin  15 mL Per Tube 4 times per day  .  HYDROcodone-acetaminophen  2 tablet Oral Once  . lamoTRIgine  200 mg Oral Daily  . levothyroxine  88 mcg Oral QAC breakfast  . magnesium oxide  400 mg Per Tube BID  . metoprolol tartrate  12.5 mg Oral BID  . ondansetron (ZOFRAN) IV  4 mg Intravenous 4 times per day  . pantoprazole sodium  40 mg Per Tube BID  . QUEtiapine  100 mg Oral BID  . saccharomyces boulardii  250 mg Oral BID  . sucralfate  1 g Oral TID AC & HS   Continuous Infusions: . feeding supplement (VITAL AF 1.2 CAL) 1,000 mL (12/08/13 1218)  . heparin 1,300 Units/hr (12/08/13 1539)    Principal Problem:   Aspiration pneumonia Active Problems:   Severe Clostridium difficile enterocolitis   Acute respiratory failure   Hematemesis   Dysphagia, pharyngoesophageal phase   H/O psychosis   Pulmonary hypertension   Duodenal ulcer, acute   Reflux esophagitis   Other and unspecified noninfectious gastroenteritis and colitis(558.9)   Ulceration of intestine   Benign neoplasm of colon   DVT (deep venous thrombosis)    Time spent: 40 minute   Emidio Warrell, [place], J  [facility] Pager [phone]. If 7PM-7AM, please contact night-coverage at [email or web address],  password [facility] 12/08/2013, 8:46 PM  LOS: 30 days

## 2013-12-09 DIAGNOSIS — R1319 Other dysphagia: Secondary | ICD-10-CM

## 2013-12-09 LAB — CBC WITH DIFFERENTIAL/PLATELET
Basophils Absolute: 0 10*3/uL (ref 0.0–0.1)
Basophils Relative: 0 % (ref 0–1)
EOS PCT: 3 % (ref 0–5)
Eosinophils Absolute: 0.2 10*3/uL (ref 0.0–0.7)
HCT: 24.6 % — ABNORMAL LOW (ref 36.0–46.0)
Hemoglobin: 8.1 g/dL — ABNORMAL LOW (ref 12.0–15.0)
Lymphocytes Relative: 44 % (ref 12–46)
Lymphs Abs: 2.9 10*3/uL (ref 0.7–4.0)
MCH: 30.1 pg (ref 26.0–34.0)
MCHC: 32.9 g/dL (ref 30.0–36.0)
MCV: 91.4 fL (ref 78.0–100.0)
MONOS PCT: 15 % — AB (ref 3–12)
Monocytes Absolute: 1 10*3/uL (ref 0.1–1.0)
NEUTROS PCT: 38 % — AB (ref 43–77)
Neutro Abs: 2.5 10*3/uL (ref 1.7–7.7)
PLATELETS: 386 10*3/uL (ref 150–400)
RBC: 2.69 MIL/uL — ABNORMAL LOW (ref 3.87–5.11)
RDW: 22.3 % — ABNORMAL HIGH (ref 11.5–15.5)
WBC: 6.6 10*3/uL (ref 4.0–10.5)

## 2013-12-09 LAB — COMPREHENSIVE METABOLIC PANEL
ALBUMIN: 2.1 g/dL — AB (ref 3.5–5.2)
ALK PHOS: 63 U/L (ref 39–117)
ALT: 9 U/L (ref 0–35)
AST: 21 U/L (ref 0–37)
BUN: 8 mg/dL (ref 6–23)
CO2: 24 mEq/L (ref 19–32)
Calcium: 8.6 mg/dL (ref 8.4–10.5)
Chloride: 104 mEq/L (ref 96–112)
Creatinine, Ser: 0.76 mg/dL (ref 0.50–1.10)
GFR calc Af Amer: >90 mL/min (ref >90)
GFR calc non Af Amer: 89 mL/min — ABNORMAL LOW (ref >90)
Glucose, Bld: 87 mg/dL (ref 70–99)
Potassium: 3.4 mEq/L — ABNORMAL LOW (ref 3.7–5.3)
SODIUM: 141 meq/L (ref 137–147)
TOTAL PROTEIN: 5.6 g/dL — AB (ref 6.0–8.3)
Total Bilirubin: <0.2 mg/dL — ABNORMAL LOW (ref 0.3–1.2)

## 2013-12-09 LAB — GLUCOSE, CAPILLARY
GLUCOSE-CAPILLARY: 107 mg/dL — AB (ref 70–99)
GLUCOSE-CAPILLARY: 162 mg/dL — AB (ref 70–99)
GLUCOSE-CAPILLARY: 145 mg/dL — AB (ref 70–99)
Glucose-Capillary: 108 mg/dL — ABNORMAL HIGH (ref 70–99)
Glucose-Capillary: 108 mg/dL — ABNORMAL HIGH (ref 70–99)
Glucose-Capillary: 91 mg/dL (ref 70–99)

## 2013-12-09 LAB — HEPARIN LEVEL (UNFRACTIONATED)
HEPARIN UNFRACTIONATED: 0.25 [IU]/mL — AB (ref 0.30–0.70)
Heparin Unfractionated: 0.51 IU/mL (ref 0.30–0.70)

## 2013-12-09 LAB — PROTIME-INR
INR: 1.05 (ref 0.00–1.49)
PROTHROMBIN TIME: 13.5 s (ref 11.6–15.2)

## 2013-12-09 LAB — MAGNESIUM: Magnesium: 1.4 mg/dL — ABNORMAL LOW (ref 1.5–2.5)

## 2013-12-09 LAB — MRSA PCR SCREENING: MRSA BY PCR: NEGATIVE

## 2013-12-09 MED ORDER — POTASSIUM CHLORIDE 10 MEQ/100ML IV SOLN
10.0000 meq | INTRAVENOUS | Status: AC
  Administered 2013-12-09 (×2): 10 meq via INTRAVENOUS
  Filled 2013-12-09 (×2): qty 100

## 2013-12-09 MED ORDER — SODIUM CHLORIDE 0.9 % IV SOLN
25.0000 mg | Freq: Two times a day (BID) | INTRAVENOUS | Status: DC | PRN
  Filled 2013-12-09: qty 1

## 2013-12-09 MED ORDER — DEXTROSE 5 % IV SOLN
25.0000 mg | Freq: Once | INTRAVENOUS | Status: DC
  Filled 2013-12-09 (×2): qty 1

## 2013-12-09 MED ORDER — MAGNESIUM SULFATE 40 MG/ML IJ SOLN
2.0000 g | Freq: Once | INTRAMUSCULAR | Status: AC
  Administered 2013-12-09: 2 g via INTRAVENOUS
  Filled 2013-12-09: qty 50

## 2013-12-09 NOTE — Progress Notes (Signed)
ANTICOAGULATION CONSULT NOTE - Follow Up Consult  Pharmacy Consult for Heparin (warfarin on hold) Indication: DVT  Allergies  Allergen Reactions  . Aspirin Rash    Patient Measurements: Height: 5\' 6"  (167.6 cm) Weight: 259 lb 12.8 oz (117.845 kg) IBW/kg (Calculated) : 59.3 HDW: 90kg  Vital Signs: Temp: 98.7 F (37.1 C) (04/01 0751) Temp src: Oral (04/01 0751) BP: 143/72 mmHg (04/01 0751) Pulse Rate: 91 (04/01 0751)  Labs:  Recent Labs  12/07/13 0641  12/08/13 0715 12/08/13 1309 12/08/13 2223 12/09/13 0448  HGB 8.1*  --  8.1*  --   --  8.1*  HCT 24.8*  --  24.5*  --   --  24.6*  PLT 390  --  356  --   --  386  LABPROT 15.8*  --  14.1  --   --  13.5  INR 1.29  --  1.11  --   --  1.05  HEPARINUNFRC 0.76*  < > 0.56 0.87* 0.33 0.25*  CREATININE 0.81  --  0.81  --   --  0.76  < > = values in this interval not displayed.  Estimated Creatinine Clearance: 95.2 ml/min (by C-G formula based on Cr of 0.76).   Medications:  Heparin 1300 units/hr  Assessment: 63 y/o F on heparin while warfarin is on hold awaiting PEG. HL remains subtherapeutic at 0.25. Other labs as above. Hgb low and stable. No bleeding noted.    Goal of Therapy:  Heparin level 0.3-0.7 units/ml Monitor platelets by anticoagulation protocol: Yes   Plan:  -Increase heparin to 1450 units/hr -Made small increase as pt previously supratherapeutic on 1500 units/hr -HL at 2200 -Daily CBC/HL -F/u plans for oral anticoag  Hughes Better, PharmD, BCPS Clinical Pharmacist Pager: [phone] 12/09/2013 3:46 PM

## 2013-12-09 NOTE — Progress Notes (Signed)
[facility] PROGRESS NOTE  [name] NFA:[id number] DOB: 07/22/1951 DOA: 11/21/2013 PCP: [name], MD  Assessment/Plan:  Acute respiratory failure:  -Secondary to aspiration pneumonia.  -clinically improved -Continue NG tube -3/17 patient  S/P fluoroscopic placement of a Dobhoff tube  -Respiratory failure has resolved since placement of Dobhoff tube -3/30 patient sitting in chair without any signs or symptoms of respiratory distress -3/31 patient's respiratory status has significantly improved, no longer requiring any O2   Pulmonary hypertension -3/17 BP slightly outside AHA guidelines continue metoprolol 12.5 mg BID -3/26 BP within AHA guidelines continue metoprolol 12.5 mg BID  -3/30 BP within AHA guidelines no changes to her regimen recommended -3/31 BP within AHA guidelines or just slightly above normal medication changes recommended   Upper GI bleed: -Patient has significant pathology; esophagitis, duodenal ulcer, duodenitis. See EGD report below -Continue All medication to be administered via Dobhoff tube (carafate, PPI po bid, bid pepcid, florastor) -3/30 Spoke at length with Dr. Barbie Banner (interventional radiologist) and at this time feels risk is too high to place J-tube. -3/31 In a.m. reconsult GI to inform them of obtaining placement of J-tube  Left upper extremity DVT 3/20 - Patient is currently on heparin and will bridge to Coumadin once issue of J-tube placement resolved -See upper GI bleed    Aspiration Pneumonia:  -Patient made n.p.o. secondary to refractory N./V. and increased risk of aspirating. -Patient has been without nutrition since admission on 3/1 consult nutrition and pharmacy for NG tube feeding. -3/16 unable to continue NG tube feeding secondary to large residuals. Will need to place Dobhoff tube in the a.m Ensuring termination in the duodenum  -3/17 S/P IR placement of Dobhoff tube with termination in the duodenum -3/17 Start enteral  feedings via Dobhoff, using enteral feeds for non- ICU patient protocol. -3/25 patient was tried on clear liquids and dysphagia diet and both have caused patient extreme episodes of N./V. DC any PO nutrition and restart feedings per Dobhoff tube. Dietitian to manage  -3/26 reconsulted [facility] to evaluate patient for placement of J-tube -3/31 patient still unable to tolerate by mouth nutrition will reconsult GI and hopefully they will agree for J-tube placement.; This is the only issue holding patient in the hospital -4/1 consult at [facility] for placement of J-tube (GI and IR have declined to place)   Dysphagia:  -Patient has significant pathology; esophagitis, duodenal ulcer, duodenitis. See EGD report below -Patient has been without nutrition since admission on 3/1 consult nutrition and pharmacy for NG tube feeding. -3/16 ASAP order placed for fluoroscopically guided placement of Dobhoff feeding to placement into the duodenum. -3./17 Dobhoff tube successfully place -3/25 patient has been unable to tolerate clear liquid diet; resulted in gagging/nausea/vomiting. Patient to be made n.p.o. and resume tube feedings Osmolite 1.2 CAL dietitian to manage -Discussed placement of PEG tube since patient has been tried numerous occasions PO feedings and failed will discuss with her family -3/26 reconsulted [facility] to evaluate patient for placement of J-tube -3/30 Spoke at length with Dr. Barbie Banner (interventional radiologist) and at this time feels risk is too high to place J-tube -4/1 consulted [facility] for placement of J-tube (GI and IR have declined to place)  -4 /1 patient again tried clear liquid diet and this resulted in intractable nausea and vomiting, we'll make patient n.p.o. Continue Vital AF at 10ml/hr via Dobhoff  Depression  -continue Lexapro, Wellbutrin, via  NG tube   Hypothyroidism:  -Synthroid 88 mcg per NG  tube    Hyperlipidemia:  -Stable.  -lipid  panel within NCEP guidelines   Hypertension:  -BP not within AHA guidelines however on metoprolol 12.5 mg BID, just slightly outside guidelines and would not make changes at this time    C. difficile colitis:  -Continue Flagyl+ PO vancomycin. Will Consider 3/13 as first full day of treatment (3/13 NG tube placed and patient able to keep all medication down) -CT scan abdomen and pelvis completed; significant pathology to include possible neoplasm see results below.  -Dr. Delfin Edis (GI) conducted Colonoscopy on 3/15, which showed significant pathology see report below, awaiting pathology reports -Please contact daughter [name]  [phone] with results of pathology report -3/26 review of patient's MAR shows that metronidazole and vancomycin stopped on 3/21, on 3/23 patient began to have watery diarrhea again. -3/28 C. difficile by PCR negative -3/31 pathology from colonoscopy on 3/15 and showed no malignancy see report below  Hx psychosis: -Likely being off of Seroquel, Lamictal and Lexapro for almost one week may be contributing factor to decreased me ntation and worsening swallowing. Continue via Dobhoff tube  Hypernatremia:  - Resolved -3/25 restart D5 1/2 normal saline 125 ml/hr, until patient tolerating enteral feeds via Dobhoff tube. -Check CMP in the a.m.   Hypokalemia  -3/14 replace k 6 runs secondary to refractory N./V., and patient having bowel prep for colonoscopy this evening   -3/15 replace potassium 10 meq x6 runs secondary to loss through NG tube/bowel preparation for her colonoscopy -3/16 replace potassium 10 meq x 5 runs -3/17 replace potassium 49meq x6 runs -3/25 replace potassium 74meq x 5 runs -3/26 replace potassium 28meq x 5 runs -4/1 replace potassium 38meq x2 runs -Recheck in the a.m. . If continues to be problem reconsult nutrition/pharmacy to add potassium to enteral feeds   Hypomagnesemia:  -3/16  replete magnesium 3 gm IV  -3/25 replete magnesium 4 gm  IV -3/26 replete magnesium 400 mg x1 via Dobhoff -3/26 continue magnesium oxide via Dobhoff BID -4/1 replete magnesium IV 2 gm  Nausea vomiting  -NG tube placed 3/13 -Most likely secondary to gastroparesis + patient significant pathology i.e. sophagitis/duodenitis/duodenal ulcer.  -3/25 DC Reglan 5 mg QID secondary to watery diarrhea starting 3/23 -3/30 repeat C. difficile by PCR on 3/28 negative -4/1 Continue scheduled Zofran, Thorazine 25 mg BID PRN intractable N./V.        Code Status: Full code  Family Communication: None  Disposition Plan: Resolution of refractory N./V.; Physical therapy recommending skilled nursing, however patient may need LTAC placement given her possible PEG placement     Consultants: Dr. Delfin Edis (GI) Dr. Barbie Banner (interventional radiologist)     Procedures: 3/15/ 2015 colonoscopy pathology results 1. Colon, biopsy, Right Ascending - BENIGN COLONIC MUCOSA WITH FOCAL ISCHEMIC-LIKE CHANGES. - THERE IS NO EVIDENCE OF IDIOPATHIC INFLAMMATORY BOWEL DISEASE, DYSPLASIA OR MALIGNANCY. 2. Colon, biopsy, Left Descending - BENIGN COLONIC MUCOSA WITH FOCAL ISCHEMIC-LIKE CHANGES. - THERE IS NO EVIDENCE OF IDIOPATHIC INFLAMMATORY BOWEL DISEASE, DYSPLASIA OR MALIGNANCY. 3. Colon, polyp(s), Left Descending at 50cm - TUBULAR ADENOMA(S). - HIGH GRADE DYSPLASIA IS NOT IDENTIFIED.   Left upper extremity venous duplex on 11/27/2013 Consistent with acute deep and superficialvein thrombosis involving the left subclavian vein, left axillary vein, left brachial vein, and left cephalic vein.   Colonoscopy with cold biopsy polypectomy and Colonoscopy with biopsy 12/02/2013 -Two sessile polyps ranging between 3-11mm in size were found in the descending colon; polypectomy was performed with cold forceps  -There was mild diverticulosis noted  in the sigmoid colon  - .diffuse nonspecific colitis throughout the lef,t transverse and right colon consistent with resolving  colitis, status post random biopsies ,no evidence of pseudomembrane  - Cecal ulcerations likely resolving colitis. Biopsies taken to rule out ischemic etiology versus pseudomembranous colitis    Abdomen pelvis with contrast 11/20/2013 -Interval progression of now diffuse colonic wall thickening, surrounding stranding, and pericolonic fluid tracking to the pelvis. Findings are most suggestive of infectious or inflammatory colitis  such as may be seen with Clostridium difficile infection, ischemia, or less likely inflammatory bowel disease. No free air to suggest perforation.  -A polypoid intraluminal masslike filling defect is noted level of the rectum which could represent malignancy such as colon cancer, although oval inpouching related to wall thickening could  appear similar. After the patient's current symptoms resolve, colonoscopy or sigmoidoscopy is recommended for further evaluation at direct visualization.  -New moderate right pleural effusion with right greater than left lower lobe atelectasis.  -New amorphous hyperdensity within the right lower lobe, which could indicate aspiration.   EGD 12/03/2013 -severe grade 4 reflux esophagitis status post biopsies  -Mild gastric retention  -Small duodenal ulcer without stigmata of bleeding  -Moderately severe duodenitis status post biopsies to rule out H. Pylori   Left PICC/Midline 3/13>> stopped 3/20  NG tube 3/13>>>  Intubation 3/2-3/5   NG tube 3/2-3/5   Right IJ central line 3/1-3/6  Echocardiogram 11/09/2013 - Left ventricle: The cavity size was normal. mild LVH.  -LVEF; 55% to 60%. - Atrial septum: No defect or patent foramen ovale was identified. - Pulmonary arteries: PA peak pressure: 77mm Hg (S).    Antibiotics: IV Zosyn 3/1-stopped 3/10 IV Flagyl 3/7>> stopped 3/21 PO vancomycin 3/7>> stopped 3/21   HPI/Subjective: 62yo BF PMHx  hypertension, hypothyroidism and psychosis who was discharged on 2/25 after  hospitalization for C. difficile colitis and we admitted on 3/1 for acute respiratory failure secondary to pneumonia. She was admitted to the critical care service and intubated. Echocardiogram checked and found to be unremarkable. Patient started on broad-spectrum antibiotics. By 3/5, she was able to be extubated and transferred to the hospitalist service in the step down unit starting 3/7. Trace pitting by speech therapy who initially recommended downgrading her diet to dysphagia with nectar thick liquids, however followup evaluation in 3/7 noted further downgraded to n.p.o. with meds only. Patient has been getting slightly more concentrated with increase in her sodium which was 150 by 3/7 and started on gentle half normal saline. C. difficile cultures repeated and patient still positive. Still having some diarrhea. 3/11 patient states can not keep her vancomycin down. However talking to her RN when patient does not know she is taking vancomycin able to keep medication down. Currently n.p.o. scheduled for EGD today. 3/12 patient obtain EGD yesterday which showed significant pathology see report below. 3/13 patient feeling improved, negative N./V. since placement of NG tube 3/14 patient with only mild episodes of nausea, states she understands the plan outlined by Dr. Olevia Perches (GI) concerning the need for colonoscopy 3/15 patient states her N./V. has resolved the NG tube placement. Patient has tolerated contrast for CT scan, medications, and bowel prep for colonoscopy. 3/17 patient sleepy but arousable S/P placement of Dobhoff tube. Request that daughter [name]  [phone] be brought up-to-date on plan of care 3/25 patient verified that she has been unable to handle any PO nutrition without consistently having severe N./V. 3/26 patient states spoke with family about needing to have a J-tube placed. I  again reassured her that this would be most likely a temporary measure, and patient agreed to allow me to reconsult  GI 3/30 patient sitting in chair is in good spirits concerning obtaining J-tube. 4/1 patient tried overnight to sip clear liquids and by this a.m. her intractable N./V. have returned.   Objective: Filed Vitals:   12/08/13 1254 12/08/13 1724 12/08/13 2016 12/09/13 0445  BP: 125/65 140/87 138/75 139/71  Pulse: 86 93 86 90  Temp: 98.8 F (37.1 C) 99.3 F (37.4 C) 99.3 F (37.4 C) 99.4 F (37.4 C)  TempSrc: Oral Oral Oral Oral  Resp: 18 18 18 17   Height:      Weight:   117.845 kg (259 lb 12.8 oz)   SpO2: 93% 96% 96% 100%    Intake/Output Summary (Last 24 hours) at 12/09/13 0733 Last data filed at 12/09/13 0636  Gross per 24 hour  Intake 1884.25 ml  Output   2452 ml  Net -567.75 ml   Filed Weights   12/07/13 0433 12/07/13 2121 12/08/13 2016  Weight: 115.667 kg (255 lb) 119.704 kg (263 lb 14.4 oz) 117.845 kg (259 lb 12.8 oz)    Exam:   General:  A./O. x4, NAD     Cardiovascular: Regular rhythm and rate,, negative murmurs rubs gallops  Respiratory: Clear to auscultation bilateral  Abdomen: Obese, mild right lower quadrant tenderness to palpation, plus bowel sound (hypoactive), diffuse tenderness to palpation (most likely secondary to overnight N./V.)  Musculoskeletal: Positive 1+plus pedal edema bilateral to knee   Data Reviewed: Basic Metabolic Panel:  Recent Labs Lab 12/03/13 0534 12/04/13 0847 12/05/13 0530 12/06/13 0500 12/07/13 0641 12/08/13 0715 12/09/13 0448  NA 140 144 141 142 143 141 141  K 2.9* 3.3* 3.5* 3.3* 3.9 3.7 3.4*  CL 105 107 106 105 106 104 104  CO2 24 28 26 27 28 26 24   GLUCOSE 117* 95 101* 90 98 95 87  BUN 5* 3* 4* 4* 6 7 8   CREATININE 0.95 0.92 0.93 0.84 0.81 0.81 0.76  CALCIUM 7.8* 8.0* 7.7* 8.1* 8.3* 8.4 8.6  MG 1.8  --  1.4* 1.3* 1.6 1.4* 1.4*  PHOS 3.5 3.1 2.9  --   --   --   --    Liver Function Tests:  Recent Labs Lab 12/05/13 0530 12/06/13 0500 12/07/13 0641 12/08/13 0715 12/09/13 0448  AST 12 14 17 15 21   ALT 6 6 8 8  9   ALKPHOS 63 62 64 63 63  BILITOT <0.2* <0.2* <0.2* <0.2* <0.2*  PROT 5.0* 5.2* 5.5* 5.4* 5.6*  ALBUMIN 1.7* 1.8* 1.9* 1.9* 2.1*   No results found for this basename: LIPASE, AMYLASE,  in the last 168 hours No results found for this basename: AMMONIA,  in the last 168 hours CBC:  Recent Labs Lab 12/04/13 0847 12/05/13 0530 12/06/13 0500 12/07/13 0641 12/08/13 0715 12/09/13 0448  WBC 5.9 6.0 6.6 6.6 7.3 6.6  NEUTROABS 3.1 2.4 3.0 3.1  --  2.5  HGB 8.0* 7.8* 8.1* 8.1* 8.1* 8.1*  HCT 23.6* 23.1* 23.7* 24.8* 24.5* 24.6*  MCV 91.5 91.7 91.2 92.2 92.8 91.4  PLT 342 322 351 390 356 386   Cardiac Enzymes: No results found for this basename: CKTOTAL, CKMB, CKMBINDEX, TROPONINI,  in the last 168 hours BNP (last 3 results)  Recent Labs  11/29/2013 1512  PROBNP 1572.0*   CBG:  Recent Labs Lab 12/08/13 1235 12/08/13 1724 12/08/13 2015 12/09/13 0039 12/09/13 0443  GLUCAP 90 81 91 108* 91  Recent Results (from the past 240 hour(s))  CLOSTRIDIUM DIFFICILE BY PCR     Status: None   Collection Time    12/05/13  8:11 PM      Result Value Ref Range Status   C difficile by pcr NEGATIVE  NEGATIVE Final     Studies: No results found.  Scheduled Meds: . buPROPion  75 mg Per Tube BID  . chlorpheniramine-HYDROcodone  5 mL Per Tube Q12H  . escitalopram  20 mg Oral Daily  . guaiFENesin  15 mL Per Tube 4 times per day  . HYDROcodone-acetaminophen  2 tablet Oral Once  . lamoTRIgine  200 mg Oral Daily  . levothyroxine  88 mcg Oral QAC breakfast  . magnesium oxide  400 mg Per Tube BID  . metoprolol tartrate  12.5 mg Oral BID  . ondansetron (ZOFRAN) IV  4 mg Intravenous 4 times per day  . pantoprazole sodium  40 mg Per Tube BID  . QUEtiapine  100 mg Oral BID  . saccharomyces boulardii  250 mg Oral BID  . sucralfate  1 g Oral TID AC & HS   Continuous Infusions: . feeding supplement (VITAL AF 1.2 CAL) 1,000 mL (12/08/13 1218)  . heparin 1,400 Units/hr (12/09/13 0612)     Principal Problem:   Aspiration pneumonia Active Problems:   Severe Clostridium difficile enterocolitis   Acute respiratory failure   Hematemesis   Dysphagia, pharyngoesophageal phase   H/O psychosis   Pulmonary hypertension   Duodenal ulcer, acute   Reflux esophagitis   Other and unspecified noninfectious gastroenteritis and colitis(558.9)   Ulceration of intestine   Benign neoplasm of colon   DVT (deep venous thrombosis)    Time spent: 40 minute   WOODS, Quentin, J  [facility] Pager [phone]. If 7PM-7AM, please contact night-coverage at [email or web address], password [facility] 12/09/2013, 7:33 AM  LOS: 31 days

## 2013-12-09 NOTE — Progress Notes (Signed)
NUTRITION FOLLOW UP  Pt meets criteria for severe MALNUTRITION in the context of acute illness as evidenced by intake of <50% x at least 5 days and moderate muscle wasting.  Intervention:   Continue Vital AF 1.2 @  goal rate of 60 ml/hr to provide 1728 kcals, 108 grams of protein, and 1168 ml of free water. Diet advancement per MD discretion. If able to tolerate full liquids, recommend calorie count with maximum supplements to attempt to meet complete nutritional needs. Will continue to follow nutrition care plan.  Nutrition Dx:   Inadequate oral intake now related to GI distress AEB inability to tolerate PO's. Ongoing.  Goal:   Pt to meet >/= 90% of their estimated nutrition needs; currently met.  Monitor:   Enteral nutrition tolerance, weight trends, labs, I/O's, PO tolerance  Assessment:   63 year old obese female with history of HTN, GERD, depression, obesity and lumbar spondylosis status post lumbar decompression surgery in January 2015. Just discharged 11/04/13 after treatment for c.diff colitis and salmonella enterocolitis. Hospitalization complicated by encephalopathy and acute renal failure (peak creatinine 3.03). Admitted 2/25 with respiratory failure thought due to combination PNA and HF.   Patient was extubated on 3/5. BSE on 3/6 with recommendations for Dysphagia 1 with Nectar Thick liquids. Pt has been on and off of a full liquid diet since 3/6. Pt underwent endoscopy 3/11. Findings consistent with severe reflux esophagitis. Pt with likely gastroparesis. CT scan of abdomen worrisome for rectal mass and colitis. Pt underwent NGT placement per RN on 3/13 for low suction to prevent continuous reflux and possible aspiration.   RD consulted to initiate NGT enteral nutrition on 3/15, feedings were initiated, however MD discontinued enteral nutrition later that afternoon.  On 3/16, pt underwent postpyloric feeding tube placement, per notes - tip of feeding tube near ligament of Treitz in  good position. TF held on 3/20 for clear liquid diet trial. Pt transitioned to full liquids, and then soft diet, however pt was unable to tolerate any liquids or solid foods. Calorie count briefly initiated (3/24 - 3/25), however it was d/c'd as pt was unable to tolerate po's.  Tube feeding resumed 3/25. Pt also transitioned to elemental formula. Remains on elemental formula as of this time. Receiving Vital AF 1.2 at 60 ml/hr via naso-jejunal tube. Tolerating well, per RN. Discussed plan of care with Case Manager. Pt is agreeable to permanent access, however, per radiology note on 3/30 - "given recent EGD findings patient will need additional 4 weeks on therapy prior to G-tube/ GJ tube placement to minimize bleeding complications."  Patient resumed clear liquids yesterday. She reports that she took a few bites of everything on her meal tray today. Was able to keep it down.  Potassium low at 3.4 - ordered for KCl Magnesium low at 1.4 - ordered for Magnesium Oxide Phosphorus WNL  Height: Ht Readings from Last 1 Encounters:  12/06/13 5' 6" (1.676 m)    Weight Status:   Wt Readings from Last 1 Encounters:  12/08/13 259 lb 12.8 oz (117.845 kg)  Admit wt (3/1) 250 lb  Re-estimated needs:  Kcal: 1800 - 2100  Protein: 100 - 110 grams Fluid: 1.8 - 2 L/day  Skin: Stage II to upper medial sacrum  Stage II to mid medial sacrum  Stage II to lower medial sacrum  Diet Order: Clear Liquid   Intake/Output Summary (Last 24 hours) at 12/09/13 1238 Last data filed at 12/09/13 0900  Gross per 24 hour  Intake 1884.25 ml  Output   2453 ml  Net -568.75 ml    Last BM: 3/31   Labs:   Recent Labs Lab 12/03/13 0534 12/04/13 0847 12/05/13 0530  12/07/13 0641 12/08/13 0715 12/09/13 0448  NA 140 144 141  < > 143 141 141  K 2.9* 3.3* 3.5*  < > 3.9 3.7 3.4*  CL 105 107 106  < > 106 104 104  CO2 _0 < > _1 BUN 5* 3* 4*  < > _2 CREATININE 0.95 0.92 0.93  < > 0.81 0.81 0.76   CALCIUM 7.8* 8.0* 7.7*  < > 8.3* 8.4 8.6  MG 1.8  --  1.4*  < > 1.6 1.4* 1.4*  PHOS 3.5 3.1 2.9  --   --   --   --   GLUCOSE 117* 95 101*  < > 98 95 87  < > = values in this interval not displayed.  CBG (last 3)   Recent Labs  12/09/13 0443 12/09/13 0751 12/09/13 1154  GLUCAP 91 108* 107*    Scheduled Meds: . buPROPion  75 mg Per Tube BID  . chlorpheniramine-HYDROcodone  5 mL Per Tube Q12H  . escitalopram  20 mg Oral Daily  . guaiFENesin  15 mL Per Tube 4 times per day  . HYDROcodone-acetaminophen  2 tablet Oral Once  . lamoTRIgine  200 mg Oral Daily  . levothyroxine  88 mcg Oral QAC breakfast  . magnesium oxide  400 mg Per Tube BID  . magnesium sulfate 1 - 4 g bolus IVPB  2 g Intravenous Once  . metoprolol tartrate  12.5 mg Oral BID  . ondansetron (ZOFRAN) IV  4 mg Intravenous 4 times per day  . pantoprazole sodium  40 mg Per Tube BID  . QUEtiapine  100 mg Oral BID  . saccharomyces boulardii  250 mg Oral BID  . sucralfate  1 g Oral TID AC & HS    Continuous Infusions: . feeding supplement (VITAL AF 1.2 CAL) 1,000 mL (12/08/13 1218)  . heparin 1,400 Units/hr (12/09/13 0612)     Inda Coke MS, RD, LDN Inpatient Registered Dietitian Pager: [phone] After-hours pager: [phone]

## 2013-12-09 NOTE — Progress Notes (Signed)
Occupational Therapy Treatment Patient Details Name: [name] MRN: [id number] DOB: Jan 03, 1951 Today's Date: 12/09/2013    History of present illness 63 year old obese female with history of HTN, GERD, depression, obesity and lumbar spondylosis status post lumbar PLIF surgery in January 2015.  Just discharged 11/04/13 after treatment for c.diff colitis and salmonella enterocolitis. Hospitalization complicated by encephalopathy and acute renal failure (peak creatinine 3.03). Admitted 2/25 with respiratory failure though due to combination PNA and HF. Pt DC to [facility] after PLIF was home 1wk with falls and difficulty when she was readmitted last time then return to [facility] at that DC   OT comments  Pt making god progress with functional goals and should continue with acute OT services to increase level of function and safety  Follow Up Recommendations  SNF;Supervision/Assistance - 24 hour    Equipment Recommendations  None recommended by OT    Recommendations for Other Services      Precautions / Restrictions Precautions Precautions: Fall;Back Precaution Comments: pt able to verbalize 3/3 back precautions, min verbal cues to maintain during toileting/hygiene standing at [facility] with RW Restrictions Weight Bearing Restrictions: No       Mobility Bed Mobility Overal bed mobility: Needs Assistance Bed Mobility: Rolling;Sidelying to Sit Rolling: Supervision Sidelying to sit: Supervision          Transfers Overall transfer level: Needs assistance Equipment used: Rolling walker (2 wheeled) Transfers: Sit to/from Stand Sit to Stand: Min guard         General transfer comment: cues for safe hand placement on RW during functional tasks/ADLs    Balance           Standing balance support: Single extremity supported;Bilateral upper extremity supported;During functional activity Standing balance-Leahy Scale: Poor Pt required assist for safety/balnce to stand at sink  for grooming and at [facility] for toileting/hygiene                     ADL   Grooming: Wash/dry hands;Wash/dry face;Minimal assistance;Standing     Lower Body Bathing: Sit to/from stand;Moderate assistance;Minimal assistance;Cueing for back precautions   Toilet Transfer: Minimal assistance;RW;BSC;Min guard Toileting- Water quality scientist and Hygiene: Sit to/from stand;Sitting/lateral lean;Cueing for back precautions;Minimal assistance     General ADL Comments: pt required increased time to complete ADL/grooming tasks standing wiht RW at [employer]   Behavior During Therapy: [facility] for tasks assessed/performed Overall Cognitive Status: Within Functional Limits for tasks assessed                                             General Comments  Pt pleasant and cooperative    Pertinent Vitals/ Pain       Pt reports a stomach ache (pt believes this is due to nutritional supplement), VSS                                                          Frequency Min 2X/week     Progress Toward Goals  OT Goals(current goals can now be found in the care plan section)  Progress towards OT goals: Progressing toward goals     Plan Discharge plan remains appropriate    End of Session Equipment Utilized During Treatment: Rolling walker;Other (comment) (BSC)  Activity Tolerance Patient tolerated treatment well   Patient Left with call bell/phone within reach;in chair   Nurse Communication  Communicated to nurse that pt reports stomach upset        Time: 1020-1054 OT Time Calculation (min): 34 min  Charges: OT General Charges $OT Visit: 1 Procedure OT Treatments $Self Care/Home Management : 8-22 mins $Therapeutic Activity: 8-22 mins  [name] 12/09/2013, 12:56 PM

## 2013-12-09 NOTE — Progress Notes (Signed)
Drug-Drug Interaction Report  Quetiapine / QT Prolonging Agents 2                                                                                                                          Phenothiazines / SSRIs and Bupropion  Significance: Major  Warning: Additive QT prolongation may occur during coadministration of QUEtiapine and chlorproMAZINE. The official package labeling for QUEtiapine states that use of QUEtiapine with medications that prolong the QT interval should be avoided.  Warning: Pharmacologic effects and plasma concentrations of chlorproMAZINE may be increased by buPROPion. Neurologic toxicity, including the potential for extrapyramidal effects, and cardiac toxicity, including the potential for torsade de pointes, may occur. Use of fluoxetine, fluvoxamine, paroxetine, and bupropion with thioridazine is contraindicated in official package labeling. Onset: Rapid Document Level: Possible  Interacting Medications/Orders:  Quetiapine Oral or Non-Oral, Systemic QT Prolonging Agents 2 Oral or Non-Oral, Systemic  1. QUEtiapine Order ([id number]): QUEtiapine (SEROQUEL) tablet 100 mg Route: Oral Start: 11/20/2013 2200 End: none Frequency: 2 times daily 1. chlorproMAZINE Order ([id number]): chlorproMAZINE (THORAZINE) 25 mg in dextrose 5 % 25 mL IVPB Route: Intravenous Start: 12/09/2013 1754 End: none Frequency: Once   Phenothiazines Systemic and Topical, administered by various routes SSRIs and Bupropion Oral or Non-Oral, Systemic  2. chlorproMAZINE Order ([id number]): chlorproMAZINE (THORAZINE) 25 mg in dextrose 5 % 25 mL IVPB Route: Intravenous Start: 12/09/2013 1754 End: none Frequency: Once 2. buPROPion Order ([id number]): buPROPion [facility]) tablet 75 mg Route: Per Tube Start: 12/03/2013 1100 End: none Frequency: 2 times daily     Management Code: Professional intervention required  Please monitor co-administration of these agents carefully for potential side  effects.  Rober Minion, PharmD., MS Clinical Pharmacist Pager:  [phone] Thank you for allowing pharmacy to be part of this patients care team.

## 2013-12-09 NOTE — Progress Notes (Signed)
ANTICOAGULATION CONSULT NOTE - Follow Up Consult  Pharmacy Consult for Heparin (warfarin on hold) Indication: DVT  Allergies  Allergen Reactions  . Aspirin Rash    Patient Measurements: Height: 5\' 6"  (167.6 cm) Weight: 259 lb 12.8 oz (117.845 kg) IBW/kg (Calculated) : 59.3 HDW: 90kg  Vital Signs: Temp: 98 F (36.7 C) (04/01 1816) Temp src: Oral (04/01 1816) BP: 128/77 mmHg (04/01 1816) Pulse Rate: 92 (04/01 1816)  Labs:  Recent Labs  12/07/13 0641  12/08/13 0715  12/08/13 2223 12/09/13 0448 12/09/13 1615  HGB 8.1*  --  8.1*  --   --  8.1*  --   HCT 24.8*  --  24.5*  --   --  24.6*  --   PLT 390  --  356  --   --  386  --   LABPROT 15.8*  --  14.1  --   --  13.5  --   INR 1.29  --  1.11  --   --  1.05  --   HEPARINUNFRC 0.76*  < > 0.56  < > 0.33 0.25* 0.51  CREATININE 0.81  --  0.81  --   --  0.76  --   < > = values in this interval not displayed.  Estimated Creatinine Clearance: 95.2 ml/min (by C-G formula based on Cr of 0.76).   Medications:  . feeding supplement (VITAL AF 1.2 CAL) 1,000 mL (12/08/13 1218)  . heparin 1,400 Units/hr (12/09/13 1635)    Assessment: 63 y/o F on heparin while warfarin is on hold awaiting PEG. Heparin level is therapeutic at 0.51 on 1400 units/hr. No bleeding noted.  Goal of Therapy:  Heparin level 0.3-0.7 units/ml Monitor platelets by anticoagulation protocol: Yes   Plan:  Continue heparin drip at 1400 units/hr Daily heparin level and CBC  [facility], [place].D., BCPS Clinical Pharmacist Pager: [phone] 12/09/2013 7:27 PM

## 2013-12-09 NOTE — Progress Notes (Signed)
Physical Therapy Treatment Patient Details Name: [name] MRN: [id number] DOB: Jan 29, 1951 Today's Date: 12/09/2013    History of Present Illness 63 year old obese female with history of HTN, GERD, depression, obesity and lumbar spondylosis status post lumbar PLIF surgery in January 2015.  Just discharged 11/04/13 after treatment for c.diff colitis and salmonella enterocolitis. Hospitalization complicated by encephalopathy and acute renal failure (peak creatinine 3.03). Admitted 2/25 with respiratory failure though due to combination PNA and HF. Pt [facility] was home 1wk with falls and difficulty when she was readmitted last time then return to [facility] at that DC    PT Comments    Patient limited with ambulation tolerance today.  States didn't sleep well and stomach has been hurting overnight and today with mulitple stooling episodes.  Follow Up Recommendations  SNF     Equipment Recommendations  None recommended by PT    Recommendations for Other Services       Precautions / Restrictions Precautions Precautions: Fall;Back Precaution Comments: pt able to verbalize 3/3 back precautions, min verbal cues to maintain during toileting/hygiene standing at [facility] with RW Restrictions Weight Bearing Restrictions: No    Mobility  Bed Mobility Overal bed mobility: Needs Assistance Bed Mobility: Rolling;Sidelying to Sit Rolling: Supervision Sidelying to sit: Supervision Supine to sit: Min assist;HOB elevated   Sit to sidelying: Mod assist General bed mobility comments: pulls up on rail to sit; assist for feet into bed and cues/assist to adjust shoulders and hips  Transfers Overall transfer level: Needs assistance Equipment used: Rolling walker (2 wheeled) Transfers: Sit to/from Stand Sit to Stand: Min assist         General transfer comment: from bed, assist for lifting   Ambulation/Gait Ambulation/Gait assistance: Min assist Ambulation Distance  (Feet): 60 Feet Assistive device: Rolling walker (2 wheeled) Gait Pattern/deviations: Step-through pattern;Decreased stride length     General Gait Details: assist to keep walker from running into items on right side; still with weaker right LE   Stairs            Wheelchair Mobility    Modified Rankin (Stroke Patients Only)       Balance Overall balance assessment: Needs assistance         Standing balance support: Single extremity supported;Bilateral upper extremity supported;During functional activity Standing balance-Leahy Scale: Poor Standing balance comment: UE assist for balance/safety                    Cognition Arousal/Alertness: Awake/alert Behavior During Therapy: WFL for tasks assessed/performed Overall Cognitive Status: Within Functional Limits for tasks assessed                      Exercises General Exercises - Lower Extremity Long Arc Quad: AROM;Both;10 reps;Seated Hip Flexion/Marching: AROM;Both;10 reps;Seated Toe Raises: AROM;Both;10 reps;Seated Heel Raises: AROM;Both;10 reps;Seated    General Comments        Pertinent Vitals/Pain C/o abdominal pain, unrated    Home Living                      Prior Function            PT Goals (current goals can now be found in the care plan section) Progress towards PT goals: Progressing toward goals    Frequency  Min 2X/week    PT Plan Current plan remains appropriate    Co-evaluation             End  of Session Equipment Utilized During Treatment: Gait belt Activity Tolerance: Treatment limited secondary to agitation Patient left: in bed;with call bell/phone within reach     Time: 1428-1451 PT Time Calculation (min): 23 min  Charges:  $Gait Training: 8-22 mins $Therapeutic Exercise: 8-22 mins                    G Codes:      Shiah Berhow,[name] 01/04/2014, 2:59 PM Magda Kiel, [place] 12-Dec-2013

## 2013-12-09 NOTE — Progress Notes (Addendum)
ANTICOAGULATION CONSULT NOTE - Follow Up Consult  Pharmacy Consult for Heparin (warfarin on hold) Indication: DVT  Allergies  Allergen Reactions  . Aspirin Rash    Patient Measurements: Height: 5\' 6"  (167.6 cm) Weight: 259 lb 12.8 oz (117.845 kg) IBW/kg (Calculated) : 59.3 HDW: 90kg  Vital Signs: Temp: 99.3 F (37.4 C) (03/31 2016) Temp src: Oral (03/31 2016) BP: 138/75 mmHg (03/31 2016) Pulse Rate: 86 (03/31 2016)  Labs:  Recent Labs  12/06/13 0500  12/07/13 0641  12/08/13 0715 12/08/13 1309 12/08/13 2223  HGB 8.1*  --  8.1*  --  8.1*  --   --   HCT 23.7*  --  24.8*  --  24.5*  --   --   PLT 351  --  390  --  356  --   --   LABPROT 18.5*  --  15.8*  --  14.1  --   --   INR 1.59*  --  1.29  --  1.11  --   --   HEPARINUNFRC 0.18*  < > 0.76*  < > 0.56 0.87* 0.33  CREATININE 0.84  --  0.81  --  0.81  --   --   < > = values in this interval not displayed.  Estimated Creatinine Clearance: 94 ml/min (by C-G formula based on Cr of 0.81).   Medications:  Heparin 1300 units/hr  Assessment: 63 y/o F on heparin while warfarin is on hold awaiting PEG (looks like IR says at least 4 wks before PEG given EGD findings). HL is 0.33 after rate decrease. Other labs as above. Hgb low and stable.   Goal of Therapy:  Heparin level 0.3-0.7 units/ml Monitor platelets by anticoagulation protocol: Yes   Plan:  -Continue heparin at 1300 units/hr -AM HL -Daily CBC/HL -Monitor for bleeding  Narda Bonds 12/09/2013,12:42 AM  12/09/2013 6:10 AM Repeat HL this AM is 0.25 -Increase heparin to 1400 units/hr (she became supra-therapeutic on 1500 units/hr) -1400 HL JLedford, PharmD

## 2013-12-09 DEATH — deceased

## 2013-12-10 ENCOUNTER — Inpatient Hospital Stay (HOSPITAL_COMMUNITY): Payer: Medicare Other

## 2013-12-10 ENCOUNTER — Encounter (HOSPITAL_COMMUNITY): Payer: Self-pay | Admitting: General Surgery

## 2013-12-10 DIAGNOSIS — R1310 Dysphagia, unspecified: Secondary | ICD-10-CM

## 2013-12-10 LAB — COMPREHENSIVE METABOLIC PANEL
ALBUMIN: 2.1 g/dL — AB (ref 3.5–5.2)
ALT: 13 U/L (ref 0–35)
AST: 29 U/L (ref 0–37)
Alkaline Phosphatase: 72 U/L (ref 39–117)
BUN: 9 mg/dL (ref 6–23)
CALCIUM: 8.2 mg/dL — AB (ref 8.4–10.5)
CO2: 23 mEq/L (ref 19–32)
CREATININE: 0.69 mg/dL (ref 0.50–1.10)
Chloride: 99 mEq/L (ref 96–112)
GFR calc Af Amer: >90 mL/min (ref >90)
Glucose, Bld: 132 mg/dL — ABNORMAL HIGH (ref 70–99)
Potassium: 3.2 mEq/L — ABNORMAL LOW (ref 3.7–5.3)
Sodium: 139 mEq/L (ref 137–147)
Total Bilirubin: 0.3 mg/dL (ref 0.3–1.2)
Total Protein: 6 g/dL (ref 6.0–8.3)

## 2013-12-10 LAB — CBC WITH DIFFERENTIAL/PLATELET
BASOS PCT: 0 % (ref 0–1)
Basophils Absolute: 0 10*3/uL (ref 0.0–0.1)
Eosinophils Absolute: 0.2 10*3/uL (ref 0.0–0.7)
Eosinophils Relative: 1 % (ref 0–5)
HCT: 30 % — ABNORMAL LOW (ref 36.0–46.0)
HEMOGLOBIN: 10 g/dL — AB (ref 12.0–15.0)
Lymphocytes Relative: 10 % — ABNORMAL LOW (ref 12–46)
Lymphs Abs: 1.8 10*3/uL (ref 0.7–4.0)
MCH: 30.7 pg (ref 26.0–34.0)
MCHC: 33.3 g/dL (ref 30.0–36.0)
MCV: 92 fL (ref 78.0–100.0)
Monocytes Absolute: 1.5 10*3/uL — ABNORMAL HIGH (ref 0.1–1.0)
Monocytes Relative: 8 % (ref 3–12)
Neutro Abs: 14.9 10*3/uL — ABNORMAL HIGH (ref 1.7–7.7)
Neutrophils Relative %: 81 % — ABNORMAL HIGH (ref 43–77)
Platelets: 425 10*3/uL — ABNORMAL HIGH (ref 150–400)
RBC: 3.26 MIL/uL — AB (ref 3.87–5.11)
RDW: 21.8 % — ABNORMAL HIGH (ref 11.5–15.5)
WBC: 18.4 10*3/uL — ABNORMAL HIGH (ref 4.0–10.5)

## 2013-12-10 LAB — HEPARIN LEVEL (UNFRACTIONATED)
HEPARIN UNFRACTIONATED: 0.29 [IU]/mL — AB (ref 0.30–0.70)
Heparin Unfractionated: <0.1 IU/mL — ABNORMAL LOW (ref 0.30–0.70)

## 2013-12-10 LAB — MAGNESIUM: MAGNESIUM: 1.5 mg/dL (ref 1.5–2.5)

## 2013-12-10 LAB — GLUCOSE, CAPILLARY
GLUCOSE-CAPILLARY: 102 mg/dL — AB (ref 70–99)
Glucose-Capillary: 137 mg/dL — ABNORMAL HIGH (ref 70–99)
Glucose-Capillary: 150 mg/dL — ABNORMAL HIGH (ref 70–99)
Glucose-Capillary: 136 mg/dL — ABNORMAL HIGH (ref 70–99)
Glucose-Capillary: 110 mg/dL — ABNORMAL HIGH (ref 70–99)

## 2013-12-10 LAB — PROTIME-INR
INR: 1.14 (ref 0.00–1.49)
Prothrombin Time: 14.4 seconds (ref 11.6–15.2)

## 2013-12-10 LAB — PREALBUMIN: Prealbumin: 10.4 mg/dL — ABNORMAL LOW (ref 17.0–34.0)

## 2013-12-10 MED ORDER — MAGNESIUM SULFATE 40 MG/ML IJ SOLN
2.0000 g | Freq: Once | INTRAMUSCULAR | Status: AC
  Administered 2013-12-10: 2 g via INTRAVENOUS
  Filled 2013-12-10: qty 50

## 2013-12-10 MED ORDER — ESCITALOPRAM OXALATE 5 MG/5ML PO SOLN
20.0000 mg | Freq: Every day | ORAL | Status: DC
  Filled 2013-12-10 (×2): qty 20

## 2013-12-10 MED ORDER — POTASSIUM CHLORIDE 20 MEQ/15ML (10%) PO LIQD
40.0000 meq | Freq: Two times a day (BID) | ORAL | Status: AC
  Administered 2013-12-10 – 2013-12-11 (×2): 40 meq via ORAL
  Filled 2013-12-10 (×2): qty 30
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
## 2013-12-10 NOTE — Progress Notes (Addendum)
ANTICOAGULATION CONSULT NOTE - Follow Up Consult  Pharmacy Consult for Heparin Indication: DVT   Allergies  Allergen Reactions  . Aspirin Rash    Patient Measurements: Height: 5\' 6"  (167.6 cm) Weight: 249 lb 6.4 oz (113.127 kg) IBW/kg (Calculated) : 59.3 Heparin Dosing Weight: 90 kg  Labs:  Recent Labs  12/08/13 0715  12/09/13 0448 12/09/13 1615 12/10/13 0611 12/10/13 1550  HGB 8.1*  --  8.1*  --  10.0*  --   HCT 24.5*  --  24.6*  --  30.0*  --   PLT 356  --  386  --  425*  --   LABPROT 14.1  --  13.5  --  14.4  --   INR 1.11  --  1.05  --  1.14  --   HEPARINUNFRC 0.56  < > 0.25* 0.51 <0.10* 0.29*  CREATININE 0.81  --  0.76  --  0.69  --   < > = values in this interval not displayed.  Estimated Creatinine Clearance: 93 ml/min (by C-G formula based on Cr of 0.69).  Assessment:   Heparin level is now 0.29 on 1650 units/hr.  Almost therapeutic.   Unsure why this morning's level was undetectable.  Coumadin on hold while PEG/feeding tube issue is being discussed; noted plan for re-evaluation on Monday, 4/6. Last Coumadin dose 3/28.  Goal of Therapy:  Heparin level 0.3-0.7 units/ml Monitor platelets by anticoagulation protocol: Yes   Plan:   Increase heparin drip to 1750 units/hr.  Next heparin level and CBC in am.  Arty Baumgartner, [place] Pager: [phone] 12/10/2013,6:00 PM

## 2013-12-10 NOTE — Consult Note (Signed)
Discussed surgery with her. Wbc up and some more diarrhea today says her abdomen hurts. Her prealbumin is also 10.  Before any plans for j tube would prefer nutrition a little better and she is getting fed via dobhoff right now.  Can recheck in 72 hours.  Once better medically please call us and we can rediscuss j tube placement otherwise will see again Monday.

## 2013-12-10 NOTE — Progress Notes (Signed)
[facility] PROGRESS NOTE  [name] XBD:[id number] DOB: [birth year] DOA: 11/15/2013 PCP: [name], MD  Assessment/Plan:  Dysphagia:  -Patient has significant pathology; esophagitis, duodenal ulcer, duodenitis.  -3./17 Dobhoff tube successfully place  -3/25 patient has been unable to tolerate clear liquid diet; resulted in gagging/nausea/vomiting. Patient to be made n.p.o. and resume tube feedings Osmolite 1.2 CAL dietitian to manage  -consulted [facility] for placement of J-tube (GI and IR have declined to place)   Leukocytosis;  Repeat chest x ray. c diff.   Left upper extremity DVT 3/20  - Patient is currently on heparin and will bridge to Coumadin once issue of J-tube placement resolved  -See upper GI bleed  -Monitor for bleeding.   Acute respiratory failure:  -Secondary to aspiration pneumonia.  -clinically improved  -Continue NG tube -3/17 patient S/P fluoroscopic placement of a Dobhoff tube  -repeat Chest x ray due to leukocytosis.   Upper GI bleed:  -Patient has significant pathology; esophagitis, duodenal ulcer, duodenitis. See EGD report below  -Continue All medication to be administered via Dobhoff tube (carafate, PPI po bid, bid pepcid, florastor)   C. difficile colitis:  -Received treatment for C diff with vancomycin and flagyl.  -Dr. Delfin Edis (GI) conducted Colonoscopy on 3/15, which showed significant pathology see report below, awaiting pathology reports  -Please contact daughter [name] [phone] with results of pathology report  -3/26 review of patient's MAR shows that metronidazole and vancomycin stopped on 3/21.  -3/31 pathology from colonoscopy on 3/15 and showed no malignancy see report below  -repeat C diff.   Aspiration Pneumonia:  -Patient has been without nutrition since admission on 3/1 consult nutrition and pharmacy for NG tube feeding.  -3/16 unable to continue NG tube feeding secondary to large residuals.  Will need to place Dobhoff tube in the a.m Ensuring termination in the duodenum  -3/17 S/P IR placement of Dobhoff tube with termination in the duodenum    Depression  -continue Lexapro, Wellbutrin, via NG tube   Hypothyroidism:  -Synthroid 88 mcg per NG tube    Hypertension:  on metoprolol 12.5 mg BID.   Hx psychosis:  -Continue with Seroquel, Lamictal and Lexapro. Will ask pharmacy to see if liquid form are available.   Hypernatremia:  - Resolved   Hypokalemia  Replete oral.   Hypomagnesemia:  Replete IV. Hold mg oxide due to diarrhea.   Nausea vomiting  -Most likely secondary to gastroparesis + patient significant pathology i.e. sophagitis/duodenitis/duodenal ulcer.  -3/25 DC Reglan 5 mg QID secondary to watery diarrhea starting 3/23  -3/30 repeat C. difficile by PCR on 3/28 negative  -4/1 Continue scheduled Zofran, Thorazine 25 mg BID PRN intractable N./V.     Code Status: full code.  Family Communication: care discussed with patient.  Disposition Plan: to be determine.    Consultants: Dr. Delfin Edis (GI)  Dr. Barbie Banner (interventional radiologist   Procedures: 3/15/ 2015 colonoscopy pathology results  1. Colon, biopsy, Right Ascending  - BENIGN COLONIC MUCOSA WITH FOCAL ISCHEMIC-LIKE CHANGES.  - THERE IS NO EVIDENCE OF IDIOPATHIC INFLAMMATORY BOWEL DISEASE, DYSPLASIA OR  MALIGNANCY.  2. Colon, biopsy, Left Descending  - BENIGN COLONIC MUCOSA WITH FOCAL ISCHEMIC-LIKE CHANGES.  - THERE IS NO EVIDENCE OF IDIOPATHIC INFLAMMATORY BOWEL DISEASE, DYSPLASIA OR  MALIGNANCY.  3. Colon, polyp(s), Left Descending at 50cm  - TUBULAR ADENOMA(S).  - HIGH GRADE DYSPLASIA IS NOT IDENTIFIED.   Left upper extremity venous duplex on 11/27/2013  Consistent with acute deep and superficialvein thrombosis  involving the left subclavian vein, left axillary vein, left brachial vein, and left cephalic vein.  Colonoscopy with cold biopsy polypectomy and Colonoscopy with biopsy  11/17/2013  -Two sessile polyps ranging between 3-16mm in size were found in the descending colon; polypectomy was performed with cold forceps  -There was mild diverticulosis noted in the sigmoid colon  - .diffuse nonspecific colitis throughout the lef,t transverse and right colon consistent with resolving colitis, status post random biopsies ,no evidence of pseudomembrane  - Cecal ulcerations likely resolving colitis. Biopsies taken to rule out ischemic etiology versus pseudomembranous colitis    EGD 11/16/2013  -severe grade 4 reflux esophagitis status post biopsies  -Mild gastric retention  -Small duodenal ulcer without stigmata of bleeding  -Moderately severe duodenitis status post biopsies to rule out H. Pylori  Left PICC/Midline 3/13>> stopped 3/20  NG tube 3/13>>>  Intubation 3/2-3/5  NG tube 3/2-3/5  Right IJ central line 3/1-3/6  Echocardiogram 11/09/2013  - Left ventricle: The cavity size was normal. mild LVH.  -LVEF; 55% to 60%. - Atrial septum: No defect or patent foramen ovale was identified. - Pulmonary arteries: PA peak pressure: 1mm Hg (S).    Antibiotics: IV Zosyn 3/1-stopped 3/10  IV Flagyl 3/7>> stopped 3/21  PO vancomycin 3/7>> stopped 3/21   HPI/Subjective: Ng tube in place. Relates abdominal pain since last night. Although she had this pain before.  Vomiting since last night also.   Objective: Filed Vitals:   12/10/13 1424  BP: 108/61  Pulse: 100  Temp: 98 F (36.7 C)  Resp: 18    Intake/Output Summary (Last 24 hours) at 12/10/13 1437 Last data filed at 12/10/13 1610  Gross per 24 hour  Intake    360 ml  Output   1109 ml  Net   -749 ml   Filed Weights   12/07/13 2121 12/08/13 2016 12/10/13 0436  Weight: 119.704 kg (263 lb 14.4 oz) 117.845 kg (259 lb 12.8 oz) 113.127 kg (249 lb 6.4 oz)    Exam:   General:  No distress.   Cardiovascular: S 1, S 2 RRR  Respiratory: CTA  Abdomen: Bs present, soft, nt.   Musculoskeletal: trace edema.    Data Reviewed: Basic Metabolic Panel:  Recent Labs Lab 12/04/13 0847  12/05/13 0530 12/06/13 0500 12/07/13 0641 12/08/13 0715 12/09/13 0448 12/10/13 0611  NA 144  --  141 142 143 141 141 139  K 3.3*  --  3.5* 3.3* 3.9 3.7 3.4* 3.2*  CL 107  --  106 105 106 104 104 99  CO2 28  --  26 27 28 26 24 23   GLUCOSE 95  --  101* 90 98 95 87 132*  BUN 3*  --  4* 4* 6 7 8 9   CREATININE 0.92  --  0.93 0.84 0.81 0.81 0.76 0.69  CALCIUM 8.0*  --  7.7* 8.1* 8.3* 8.4 8.6 8.2*  MG  --   < > 1.4* 1.3* 1.6 1.4* 1.4* 1.5  PHOS 3.1  --  2.9  --   --   --   --   --   < > = values in this interval not displayed. Liver Function Tests:  Recent Labs Lab 12/06/13 0500 12/07/13 0641 12/08/13 0715 12/09/13 0448 12/10/13 0611  AST 14 17 15 21 29   ALT 6 8 8 9 13   ALKPHOS 62 64 63 63 72  BILITOT <0.2* <0.2* <0.2* <0.2* 0.3  PROT 5.2* 5.5* 5.4* 5.6* 6.0  ALBUMIN 1.8* 1.9* 1.9* 2.1*  2.1*   No results found for this basename: LIPASE, AMYLASE,  in the last 168 hours No results found for this basename: AMMONIA,  in the last 168 hours CBC:  Recent Labs Lab 12/05/13 0530 12/06/13 0500 12/07/13 0641 12/08/13 0715 12/09/13 0448 12/10/13 0611  WBC 6.0 6.6 6.6 7.3 6.6 18.4*  NEUTROABS 2.4 3.0 3.1  --  2.5 14.9*  HGB 7.8* 8.1* 8.1* 8.1* 8.1* 10.0*  HCT 23.1* 23.7* 24.8* 24.5* 24.6* 30.0*  MCV 91.7 91.2 92.2 92.8 91.4 92.0  PLT 322 351 390 356 386 425*   Cardiac Enzymes: No results found for this basename: CKTOTAL, CKMB, CKMBINDEX, TROPONINI,  in the last 168 hours BNP (last 3 results)  Recent Labs  2013-11-15 1512  PROBNP 1572.0*   CBG:  Recent Labs Lab 12/09/13 1719 12/09/13 2002 12/10/13 0421 12/10/13 0740 12/10/13 1145  GLUCAP 162* 145* 137* 150* 136*    Recent Results (from the past 240 hour(s))  CLOSTRIDIUM DIFFICILE BY PCR     Status: None   Collection Time    12/05/13  8:11 PM      Result Value Ref Range Status   C difficile by pcr NEGATIVE  NEGATIVE Final  MRSA PCR  SCREENING     Status: None   Collection Time    12/09/13  1:13 PM      Result Value Ref Range Status   MRSA by PCR NEGATIVE  NEGATIVE Final   Comment:            The GeneXpert MRSA Assay (FDA     approved for NASAL specimens     only), is one component of a     comprehensive MRSA colonization     surveillance program. It is not     intended to diagnose MRSA     infection nor to guide or     monitor treatment for     MRSA infections.     Studies: Dg Abd 2 Views  12/10/2013   CLINICAL DATA:  Abdominal pain.  EXAM: ABDOMEN - 2 VIEW  COMPARISON:  None.  FINDINGS: The bowel gas pattern is normal. There is no evidence of free air. No radio-opaque calculi or other significant radiographic abnormality is seen. No evidence free air. Feeding tube is appreciated curled in the expected region of the stomach. Postsurgical changes in the lumbar spine.  IMPRESSION: Negative.   Electronically Signed   By: Margaree Mackintosh M.D.   On: 12/10/2013 12:41    Scheduled Meds: . buPROPion  75 mg Per Tube BID  . chlorpheniramine-HYDROcodone  5 mL Per Tube Q12H  . chlorproMAZINE (THORAZINE) IV  25 mg Intravenous Once  . escitalopram  20 mg Oral Daily  . guaiFENesin  15 mL Per Tube 4 times per day  . HYDROcodone-acetaminophen  2 tablet Oral Once  . lamoTRIgine  200 mg Oral Daily  . levothyroxine  88 mcg Oral QAC breakfast  . magnesium oxide  400 mg Per Tube BID  . metoprolol tartrate  12.5 mg Oral BID  . ondansetron (ZOFRAN) IV  4 mg Intravenous 4 times per day  . pantoprazole sodium  40 mg Per Tube BID  . QUEtiapine  100 mg Oral BID  . saccharomyces boulardii  250 mg Oral BID  . sucralfate  1 g Oral TID AC & HS   Continuous Infusions: . feeding supplement (VITAL AF 1.2 CAL) Stopped (12/10/13 1258)  . heparin 1,650 Units/hr (12/10/13 1016)    Principal Problem:   Aspiration  pneumonia Active Problems:   Severe Clostridium difficile enterocolitis   Acute respiratory failure   Hematemesis    Dysphagia, pharyngoesophageal phase   H/O psychosis   Pulmonary hypertension   Duodenal ulcer, acute   Reflux esophagitis   Other and unspecified noninfectious gastroenteritis and colitis(558.9)   Ulceration of intestine   Benign neoplasm of colon   DVT (deep venous thrombosis)    Time spent: 35 minutes.     Niel Hummer A  [facility] Pager [phone]. If 7PM-7AM, please contact night-coverage at [email or web address], password [facility] 12/10/2013, 2:37 PM  LOS: 32 days
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
## 2013-12-10 NOTE — Progress Notes (Signed)
Pt requested to have tube feeding disconnected d/t ill feeling in stomach. Will attempt to reconnect feeding in approx 1 hour.

## 2013-12-10 NOTE — Care Management Note (Signed)
Received call from [facility] re possible adm of this pt. Also noted surgical consult and plan to followup in 72hr. If pt is able to tol placement of j-tube in the next few days then she could possibly return to her SNF as soon as she is tol feedings via that tube. Discussed with [facility] and they will follow for possible adm in case tube can not be placed in a reasonable amt of time.  CRoyal RN MPH, case manager [phone]

## 2013-12-10 NOTE — Progress Notes (Signed)
Asked pt if I could resume tube feeding; pt refused in fear of abdominal pain.

## 2013-12-11 DIAGNOSIS — Z113 Encounter for screening for infections with a predominantly sexual mode of transmission: Secondary | ICD-10-CM

## 2013-12-11 DIAGNOSIS — K209 Esophagitis, unspecified without bleeding: Secondary | ICD-10-CM

## 2013-12-11 DIAGNOSIS — R110 Nausea: Secondary | ICD-10-CM

## 2013-12-11 LAB — COMPREHENSIVE METABOLIC PANEL
ALBUMIN: 1.7 g/dL — AB (ref 3.5–5.2)
ALT: 18 U/L (ref 0–35)
AST: 28 U/L (ref 0–37)
Alkaline Phosphatase: 57 U/L (ref 39–117)
BUN: 14 mg/dL (ref 6–23)
CO2: 23 meq/L (ref 19–32)
Calcium: 8.1 mg/dL — ABNORMAL LOW (ref 8.4–10.5)
Chloride: 98 mEq/L (ref 96–112)
Creatinine, Ser: 1.04 mg/dL (ref 0.50–1.10)
GFR calc Af Amer: 65 mL/min — ABNORMAL LOW (ref >90)
GFR, EST NON AFRICAN AMERICAN: 56 mL/min — AB (ref >90)
Glucose, Bld: 93 mg/dL (ref 70–99)
POTASSIUM: 3.3 meq/L — AB (ref 3.7–5.3)
SODIUM: 138 meq/L (ref 137–147)
Total Bilirubin: 0.7 mg/dL (ref 0.3–1.2)
Total Protein: 5.6 g/dL — ABNORMAL LOW (ref 6.0–8.3)

## 2013-12-11 LAB — GLUCOSE, CAPILLARY
GLUCOSE-CAPILLARY: 103 mg/dL — AB (ref 70–99)
GLUCOSE-CAPILLARY: 81 mg/dL (ref 70–99)
GLUCOSE-CAPILLARY: 96 mg/dL (ref 70–99)
Glucose-Capillary: 80 mg/dL (ref 70–99)
Glucose-Capillary: 91 mg/dL (ref 70–99)
Glucose-Capillary: 94 mg/dL (ref 70–99)
Glucose-Capillary: 96 mg/dL (ref 70–99)

## 2013-12-11 LAB — URINE MICROSCOPIC-ADD ON

## 2013-12-11 LAB — CBC WITH DIFFERENTIAL/PLATELET
BASOS ABS: 0 10*3/uL (ref 0.0–0.1)
Basophils Relative: 0 % (ref 0–1)
EOS PCT: 5 % (ref 0–5)
Eosinophils Absolute: 0.6 10*3/uL (ref 0.0–0.7)
HCT: 28.9 % — ABNORMAL LOW (ref 36.0–46.0)
Hemoglobin: 9.7 g/dL — ABNORMAL LOW (ref 12.0–15.0)
LYMPHS ABS: 1.3 10*3/uL (ref 0.7–4.0)
Lymphocytes Relative: 11 % — ABNORMAL LOW (ref 12–46)
MCH: 30.8 pg (ref 26.0–34.0)
MCHC: 33.6 g/dL (ref 30.0–36.0)
MCV: 91.7 fL (ref 78.0–100.0)
MONO ABS: 1.9 10*3/uL — AB (ref 0.1–1.0)
Monocytes Relative: 16 % — ABNORMAL HIGH (ref 3–12)
Neutro Abs: 8.1 10*3/uL — ABNORMAL HIGH (ref 1.7–7.7)
Neutrophils Relative %: 68 % (ref 43–77)
PLATELETS: 430 10*3/uL — AB (ref 150–400)
RBC: 3.15 MIL/uL — ABNORMAL LOW (ref 3.87–5.11)
RDW: 21.4 % — AB (ref 11.5–15.5)
WBC: 11.9 10*3/uL — ABNORMAL HIGH (ref 4.0–10.5)

## 2013-12-11 LAB — URINALYSIS, ROUTINE W REFLEX MICROSCOPIC
GLUCOSE, UA: NEGATIVE mg/dL
Ketones, ur: 15 mg/dL — AB
Nitrite: POSITIVE — AB
PH: 5.5 (ref 5.0–8.0)
Protein, ur: 100 mg/dL — AB
SPECIFIC GRAVITY, URINE: 1.029 (ref 1.005–1.030)
Urobilinogen, UA: 0.2 mg/dL (ref 0.0–1.0)

## 2013-12-11 LAB — HEPARIN LEVEL (UNFRACTIONATED): Heparin Unfractionated: <0.1 IU/mL — ABNORMAL LOW (ref 0.30–0.70)

## 2013-12-11 LAB — MAGNESIUM: Magnesium: 2 mg/dL (ref 1.5–2.5)

## 2013-12-11 LAB — CLOSTRIDIUM DIFFICILE BY PCR: Toxigenic C. Difficile by PCR: POSITIVE — AB

## 2013-12-11 MED ORDER — VANCOMYCIN 50 MG/ML ORAL SOLUTION: 125.0000 mg | ORAL | Status: DC

## 2013-12-11 MED ORDER — VANCOMYCIN 50 MG/ML ORAL SOLUTION
125.0000 mg | Freq: Four times a day (QID) | ORAL | Status: DC
  Administered 2013-12-11: 125 mg via ORAL
  Filled 2013-12-11 (×3): qty 2.5

## 2013-12-11 MED ORDER — ESCITALOPRAM OXALATE 20 MG PO TABS
20.0000 mg | ORAL_TABLET | Freq: Every day | ORAL | Status: DC
  Administered 2013-12-11 – 2013-12-12 (×2): 20 mg via ORAL
  Filled 2013-12-11 (×2): qty 1

## 2013-12-11 MED ORDER — VANCOMYCIN 50 MG/ML ORAL SOLUTION
125.0000 mg | Freq: Four times a day (QID) | ORAL | Status: DC
  Administered 2013-12-11 – 2013-12-12 (×4): 125 mg via ORAL
  Filled 2013-12-11 (×6): qty 2.5

## 2013-12-11 MED ORDER — PANTOPRAZOLE SODIUM 40 MG IV SOLR
40.0000 mg | Freq: Two times a day (BID) | INTRAVENOUS | Status: DC
  Administered 2013-12-11 – 2013-12-12 (×2): 40 mg via INTRAVENOUS
  Filled 2013-12-11 (×3): qty 40

## 2013-12-11 MED ORDER — VANCOMYCIN 50 MG/ML ORAL SOLUTION: 125.0000 mg | Freq: Two times a day (BID) | ORAL | Status: DC

## 2013-12-11 MED ORDER — ACETAMINOPHEN 160 MG/5ML PO SOLN
650.0000 mg | Freq: Four times a day (QID) | ORAL | Status: DC | PRN
  Administered 2013-12-11 – 2013-12-12 (×2): 650 mg via NASOGASTRIC
  Filled 2013-12-11 (×2): qty 20.3

## 2013-12-11 MED ORDER — METRONIDAZOLE IN NACL 5-0.79 MG/ML-% IV SOLN
500.0000 mg | Freq: Three times a day (TID) | INTRAVENOUS | Status: DC
  Administered 2013-12-11: 500 mg via INTRAVENOUS
  Filled 2013-12-11 (×3): qty 100

## 2013-12-11 MED ORDER — VANCOMYCIN 50 MG/ML ORAL SOLUTION: 125.0000 mg | Freq: Every day | ORAL | Status: DC

## 2013-12-11 MED ORDER — ENOXAPARIN SODIUM 120 MG/0.8ML ~~LOC~~ SOLN
110.0000 mg | Freq: Two times a day (BID) | SUBCUTANEOUS | Status: DC
  Administered 2013-12-11 – 2013-12-12 (×3): 110 mg via SUBCUTANEOUS
  Filled 2013-12-11 (×6): qty 0.8

## 2013-12-11 MED ORDER — POTASSIUM CHLORIDE CRYS ER 20 MEQ PO TBCR
40.0000 meq | EXTENDED_RELEASE_TABLET | Freq: Two times a day (BID) | ORAL | Status: AC
  Administered 2013-12-11 (×2): 40 meq via ORAL
  Filled 2013-12-11 (×2): qty 2
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
## 2013-12-11 NOTE — Progress Notes (Signed)
[facility] PROGRESS NOTE  RAYGEN LINQUIST KGM:[id number] DOB: 20-Nov-1950 DOA: 12/02/2013 PCP: [name], MD  Assessment/Plan:  Dysphagia:  -Patient has significant pathology; esophagitis, duodenal ulcer, duodenitis.  -3./17 Dobhoff tube successfully placed -3/25 patient has been unable to tolerate clear liquid diet; resulted in gagging/nausea/vomiting. Patient to be made n.p.o. and resume tube feedings Osmolite 1.2 CAL dietitian to manage  -J tube placement on hold due to worsening WBC, and possible recurrent C diff.  -Will ask GI to see patient.   C. difficile colitis: Recurrent or partially treated.  -Received treatment for C diff with vancomycin and flagyl.  -Dr. Delfin Edis (GI) conducted Colonoscopy on 3/15, which showed significant pathology see report below, awaiting pathology reports  -Please contact daughter [name] [phone] with results of pathology report  -metronidazole and vancomycin stopped on 3/21.  -3/31 pathology from colonoscopy on 3/15 and showed no malignancy see report below  -repeat C diff 4-2 positive.  -Restarted flagyl and vancomycin 4-3. Day 1.    Leukocytosis;  WBC decreased from 18 to 11.9 Chest x ray negative for PNA.  C diff positive.   Left upper extremity DVT 3/20  - Patient is currently on heparin and will bridge to Coumadin once issue of J-tube placement resolved  -See upper GI bleed  -Monitor for bleeding.  -UA with positive nitrates.   Acute respiratory failure:  -Secondary to aspiration pneumonia.  -clinically improved  -Continue NG tube -3/17 patient S/P fluoroscopic placement of a Dobhoff tube   Upper GI bleed:  -Patient has significant pathology; esophagitis, duodenal ulcer, duodenitis. See EGD report below  -Continue All medication to be administered via Dobhoff tube (carafate, PPI po bid, bid pepcid, florastor)    Aspiration Pneumonia:  -3/16 unable to continue NG tube feeding secondary to large residuals. Will  need to place Dobhoff tube in the a.m Ensuring termination in the duodenum  -3/17 S/P IR placement of Dobhoff tube with termination in the duodenum   Depression  -continue Lexapro, Wellbutrin, via NG tube   Hypothyroidism:  -Synthroid 88 mcg per NG tube    Hypertension:  on metoprolol 12.5 mg BID.   Hx psychosis:  -Continue with Seroquel, Lamictal and Lexapro. Will ask pharmacy to see if liquid form are available.   Hypernatremia:  - Resolved   Hypokalemia  Replete oral.   Hypomagnesemia:  Replete IV. Hold mg oxide due to diarrhea.   Nausea vomiting  -Most likely secondary to gastroparesis + patient significant pathology i.e. sophagitis/duodenitis/duodenal ulcer.  -3/25 DC Reglan 5 mg QID secondary to watery diarrhea starting 3/23  -3/30 repeat C. difficile by PCR on 3/28 negative  -4/1 Continue scheduled Zofran, Thorazine 25 mg BID PRN intractable N./V.     Code Status: full code.  Family Communication: care discussed with patient.  Disposition Plan: to be determine.    Consultants: Dr. Delfin Edis (GI)  Dr. Barbie Banner (interventional radiologist   Procedures: 3/15/ 2015 colonoscopy pathology results  1. Colon, biopsy, Right Ascending  - BENIGN COLONIC MUCOSA WITH FOCAL ISCHEMIC-LIKE CHANGES.  - THERE IS NO EVIDENCE OF IDIOPATHIC INFLAMMATORY BOWEL DISEASE, DYSPLASIA OR  MALIGNANCY.  2. Colon, biopsy, Left Descending  - BENIGN COLONIC MUCOSA WITH FOCAL ISCHEMIC-LIKE CHANGES.  - THERE IS NO EVIDENCE OF IDIOPATHIC INFLAMMATORY BOWEL DISEASE, DYSPLASIA OR  MALIGNANCY.  3. Colon, polyp(s), Left Descending at 50cm  - TUBULAR ADENOMA(S).  - HIGH GRADE DYSPLASIA IS NOT IDENTIFIED.   Left upper extremity venous duplex on 11/27/2013  Consistent with acute deep and  superficialvein thrombosis involving the left subclavian vein, left axillary vein, left brachial vein, and left cephalic vein.  Colonoscopy with cold biopsy polypectomy and Colonoscopy with biopsy 11/21/2013   -Two sessile polyps ranging between 3-78mm in size were found in the descending colon; polypectomy was performed with cold forceps  -There was mild diverticulosis noted in the sigmoid colon  - .diffuse nonspecific colitis throughout the lef,t transverse and right colon consistent with resolving colitis, status post random biopsies ,no evidence of pseudomembrane  - Cecal ulcerations likely resolving colitis. Biopsies taken to rule out ischemic etiology versus pseudomembranous colitis    EGD 11/12/2013  -severe grade 4 reflux esophagitis status post biopsies  -Mild gastric retention  -Small duodenal ulcer without stigmata of bleeding  -Moderately severe duodenitis status post biopsies to rule out H. Pylori  Left PICC/Midline 3/13>> stopped 3/20  NG tube 3/13>>>  Intubation 3/2-3/5  NG tube 3/2-3/5  Right IJ central line 3/1-3/6  Echocardiogram 11/09/2013  - Left ventricle: The cavity size was normal. mild LVH.  -LVEF; 55% to 60%. - Atrial septum: No defect or patent foramen ovale was identified. - Pulmonary arteries: PA peak pressure: 33mm Hg (S).    Antibiotics: IV Zosyn 3/1-stopped 3/10  IV Flagyl 3/7>> stopped 3/21  PO vancomycin 3/7>> stopped 3/21   HPI/Subjective: Still with diarrhea, last episode of vomiting was yesterday.  Abdominal pain improved.    Objective: Filed Vitals:   12/11/13 0953  BP: 105/71  Pulse: 118  Temp: 98.4 F (36.9 C)  Resp: 20   No intake or output data in the 24 hours ending 12/11/13 1329 Filed Weights   12/10/13 0436 12/10/13 2136 12/11/13 0500  Weight: 113.127 kg (249 lb 6.4 oz) 111.403 kg (245 lb 9.6 oz) 111.131 kg (245 lb)    Exam:   General:  No distress.   Cardiovascular: S 1, S 2 RRR  Respiratory: CTA  Abdomen: Bs present, soft, nt.   Musculoskeletal: trace edema.   Data Reviewed: Basic Metabolic Panel:  Recent Labs Lab 12/05/13 0530  12/07/13 1610 12/08/13 0715 12/09/13 0448 12/10/13 9604 12/11/13 0613  NA 141   < > 143 141 141 139 138  K 3.5*  < > 3.9 3.7 3.4* 3.2* 3.3*  CL 106  < > 106 104 104 99 98  CO2 26  < > 28 26 24 23 23   GLUCOSE 101*  < > 98 95 87 132* 93  BUN 4*  < > 6 7 8 9 14   CREATININE 0.93  < > 0.81 0.81 0.76 0.69 1.04  CALCIUM 7.7*  < > 8.3* 8.4 8.6 8.2* 8.1*  MG 1.4*  < > 1.6 1.4* 1.4* 1.5 2.0  PHOS 2.9  --   --   --   --   --   --   < > = values in this interval not displayed. Liver Function Tests:  Recent Labs Lab 12/07/13 0641 12/08/13 0715 12/09/13 0448 12/10/13 0611 12/11/13 0613  AST 17 15 21 29 28   ALT 8 8 9 13 18   ALKPHOS 64 63 63 72 57  BILITOT <0.2* <0.2* <0.2* 0.3 0.7  PROT 5.5* 5.4* 5.6* 6.0 5.6*  ALBUMIN 1.9* 1.9* 2.1* 2.1* 1.7*   No results found for this basename: LIPASE, AMYLASE,  in the last 168 hours No results found for this basename: AMMONIA,  in the last 168 hours CBC:  Recent Labs Lab 12/06/13 0500 12/07/13 0641 12/08/13 0715 12/09/13 0448 12/10/13 0611 12/11/13 0613  WBC 6.6 6.6  7.3 6.6 18.4* 11.9*  NEUTROABS 3.0 3.1  --  2.5 14.9* 8.1*  HGB 8.1* 8.1* 8.1* 8.1* 10.0* 9.7*  HCT 23.7* 24.8* 24.5* 24.6* 30.0* 28.9*  MCV 91.2 92.2 92.8 91.4 92.0 91.7  PLT 351 390 356 386 425* 430*   Cardiac Enzymes: No results found for this basename: CKTOTAL, CKMB, CKMBINDEX, TROPONINI,  in the last 168 hours BNP (last 3 results)  Recent Labs  12/05/2013 1512  PROBNP 1572.0*   CBG:  Recent Labs Lab 12/10/13 2134 12/11/13 0039 12/11/13 0438 12/11/13 0813 12/11/13 1137  GLUCAP 102* 103* 91 96 96    Recent Results (from the past 240 hour(s))  CLOSTRIDIUM DIFFICILE BY PCR     Status: None   Collection Time    12/05/13  8:11 PM      Result Value Ref Range Status   C difficile by pcr NEGATIVE  NEGATIVE Final  MRSA PCR SCREENING     Status: None   Collection Time    12/09/13  1:13 PM      Result Value Ref Range Status   MRSA by PCR NEGATIVE  NEGATIVE Final   Comment:            The GeneXpert MRSA Assay (FDA     approved for NASAL  specimens     only), is one component of a     comprehensive MRSA colonization     surveillance program. It is not     intended to diagnose MRSA     infection nor to guide or     monitor treatment for     MRSA infections.  CLOSTRIDIUM DIFFICILE BY PCR     Status: Abnormal   Collection Time    12/10/13  5:34 PM      Result Value Ref Range Status   C difficile by pcr POSITIVE (*) NEGATIVE Final   Comment: CRITICAL RESULT CALLED TO, READ BACK BY AND VERIFIED WITH:     T.HUBBARD,RN 12/11/13 0918 BY BSLADE     Studies: Dg Chest 2 View  12/10/2013   CLINICAL DATA:  Leukocytosis  EXAM: CHEST  2 VIEW  COMPARISON:  Prior chest x-ray 11/25/2013  FINDINGS: Unchanged position of enteric feeding tube. The weighted tip appears to project over the proximal small bowel. The nasogastric tube is been removed. Cardiac and mediastinal contours are unchanged. Low inspiratory volumes. No definite focal airspace consolidation. The left upper extremity PICC is been removed.  IMPRESSION: No active cardiopulmonary disease.   Electronically Signed   By: Jacqulynn Cadet M.D.   On: 12/10/2013 23:38   Dg Abd 2 Views  12/10/2013   CLINICAL DATA:  Abdominal pain.  EXAM: ABDOMEN - 2 VIEW  COMPARISON:  None.  FINDINGS: The bowel gas pattern is normal. There is no evidence of free air. No radio-opaque calculi or other significant radiographic abnormality is seen. No evidence free air. Feeding tube is appreciated curled in the expected region of the stomach. Postsurgical changes in the lumbar spine.  IMPRESSION: Negative.   Electronically Signed   By: Margaree Mackintosh M.D.   On: 12/10/2013 12:41    Scheduled Meds: . buPROPion  75 mg Per Tube BID  . chlorpheniramine-HYDROcodone  5 mL Per Tube Q12H  . chlorproMAZINE (THORAZINE) IV  25 mg Intravenous Once  . enoxaparin (LOVENOX) injection  110 mg Subcutaneous BID  . escitalopram  20 mg Oral Daily  . guaiFENesin  15 mL Per Tube 4 times per day  . HYDROcodone-acetaminophen  2  tablet Oral Once  . lamoTRIgine  200 mg Oral Daily  . levothyroxine  88 mcg Oral QAC breakfast  . metoprolol tartrate  12.5 mg Oral BID  . metronidazole  500 mg Intravenous Q8H  . ondansetron (ZOFRAN) IV  4 mg Intravenous 4 times per day  . pantoprazole sodium  40 mg Per Tube BID  . QUEtiapine  100 mg Oral BID  . saccharomyces boulardii  250 mg Oral BID  . sucralfate  1 g Oral TID AC & HS  . vancomycin  125 mg Oral QID   Continuous Infusions: . feeding supplement (VITAL AF 1.2 CAL) 1,000 mL (12/11/13 0902)    Principal Problem:   Aspiration pneumonia Active Problems:   Severe Clostridium difficile enterocolitis   Acute respiratory failure   Hematemesis   Dysphagia, pharyngoesophageal phase   H/O psychosis   Pulmonary hypertension   Duodenal ulcer, acute   Reflux esophagitis   Other and unspecified noninfectious gastroenteritis and colitis(558.9)   Ulceration of intestine   Benign neoplasm of colon   DVT (deep venous thrombosis)    Time spent: 30 minutes.     Niel Hummer A  [facility] Pager [phone]. If 7PM-7AM, please contact night-coverage at [email or web address], password [facility] 12/11/2013, 1:29 PM  LOS: 33 days
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
## 2013-12-11 NOTE — Progress Notes (Signed)
NUTRITION FOLLOW UP  Pt meets criteria for severe MALNUTRITION in the context of acute illness as evidenced by intake of <50% x at least 5 days and moderate muscle wasting.  Intervention:   Recommend continued advancement of Vital AF 1.2 by 10 ml q 4 hours, to goal rate of 60 ml/hr to provide 1728 kcals, 108 grams of protein, and 1168 ml of free water. Diet advancement per MD discretion. If able to tolerate full liquids, recommend calorie count with maximum supplements to attempt to meet complete nutritional needs. Will continue to follow nutrition care plan.  Nutrition Dx:   Inadequate oral intake now related to GI distress AEB inability to tolerate PO's. Ongoing.  Goal:   Pt to meet >/= 90% of their estimated nutrition needs; currently unmet.  Monitor:   Enteral nutrition tolerance, weight trends, labs, I/O's  Assessment:   63 year old obese female with history of HTN, GERD, depression, obesity and lumbar spondylosis status post lumbar decompression surgery in January 2015. Just discharged 11/04/13 after treatment for c.diff colitis and salmonella enterocolitis. Hospitalization complicated by encephalopathy and acute renal failure (peak creatinine 3.03). Admitted 2/25 with respiratory failure thought due to combination PNA and HF.   Patient was extubated on 3/5. BSE on 3/6 with recommendations for Dysphagia 1 with Nectar Thick liquids. Pt has been on and off of a full liquid diet since 3/6. Pt underwent endoscopy 3/11. Findings consistent with severe reflux esophagitis. Pt with likely gastroparesis. CT scan of abdomen worrisome for rectal mass and colitis. Pt underwent NGT placement per RN on 3/13 for low suction to prevent continuous reflux and possible aspiration.   RD consulted to initiate NGT enteral nutrition on 3/15, feedings were initiated, however MD discontinued enteral nutrition later that afternoon.  On 3/16, pt underwent postpyloric feeding tube placement, per notes - tip of  feeding tube near ligament of Treitz in good position. TF held on 3/20 for clear liquid diet trial. Pt transitioned to full liquids, and then soft diet, however pt was unable to tolerate any liquids or solid foods. Calorie count briefly initiated (3/24 - 3/25), however it was d/c'd as pt was unable to tolerate po's.  Tube feeding resumed 3/25. Pt also transitioned to elemental formula. Remains on elemental formula as of this time. Receiving Vital AF 1.2 at 20 ml/hr via naso-jejunal tube. Tolerating well, per RN. Pt is agreeable to permanent access, however, per radiology note on 3/30 - "given recent EGD findings patient will need additional 4 weeks on therapy prior to G-tube/ GJ tube placement to minimize bleeding complications." Surgery has been consulted to assess if they can place surgical j-tube. Per surgery, before any plans for j-tube, team is requesting nutrition status is better. Plan to re-check x 72 hours (fom 4/2.)  Patient resumed clear liquids 3/31. Intake was minimal, per RN, pt is to be NPO today.  Potassium low at 3.4 - ordered for KCl Magnesium WNL Phosphorus WNL Prealbumin is low at 10.4  Height: Ht Readings from Last 1 Encounters:  12/06/13 5\' 6"  (1.676 m)    Weight Status:   Wt Readings from Last 1 Encounters:  12/11/13 245 lb (111.131 kg)  Admit wt (3/1) 250 lb  Re-estimated needs:  Kcal: 1800 - 2100  Protein: 100 - 110 grams Fluid: 1.8 - 2 L/day  Skin: Stage II to upper medial sacrum  Stage II to mid medial sacrum  Stage II to lower medial sacrum  Diet Order:   NPO  No intake or output data  in the 24 hours ending 12/11/13 1018  Last BM: 4/2   Labs:   Recent Labs Lab 12/05/13 0530  12/09/13 0448 12/10/13 0611 12/11/13 0613  NA 141  < > 141 139 138  K 3.5*  < > 3.4* 3.2* 3.3*  CL 106  < > 104 99 98  CO2 26  < > 24 23 23   BUN 4*  < > 8 9 14   CREATININE 0.93  < > 0.76 0.69 1.04  CALCIUM 7.7*  < > 8.6 8.2* 8.1*  MG 1.4*  < > 1.4* 1.5 2.0  PHOS  2.9  --   --   --   --   GLUCOSE 101*  < > 87 132* 93  < > = values in this interval not displayed.  CBG (last 3)   Recent Labs  12/11/13 0039 12/11/13 0438 12/11/13 0813  GLUCAP 103* 91 96   Prealbumin  Date/Time Value Ref Range Status  12/10/2013  6:11 AM 10.4* 17.0 - 34.0 mg/dL Final     Performed at [facility]    Scheduled Meds: . buPROPion  75 mg Per Tube BID  . chlorpheniramine-HYDROcodone  5 mL Per Tube Q12H  . chlorproMAZINE (THORAZINE) IV  25 mg Intravenous Once  . escitalopram  20 mg Oral Daily  . guaiFENesin  15 mL Per Tube 4 times per day  . HYDROcodone-acetaminophen  2 tablet Oral Once  . lamoTRIgine  200 mg Oral Daily  . levothyroxine  88 mcg Oral QAC breakfast  . metoprolol tartrate  12.5 mg Oral BID  . ondansetron (ZOFRAN) IV  4 mg Intravenous 4 times per day  . pantoprazole sodium  40 mg Per Tube BID  . potassium chloride  40 mEq Oral BID  . QUEtiapine  100 mg Oral BID  . saccharomyces boulardii  250 mg Oral BID  . sucralfate  1 g Oral TID AC & HS  . vancomycin  125 mg Oral QID    Continuous Infusions: . feeding supplement (VITAL AF 1.2 CAL) 1,000 mL (12/11/13 0902)  . heparin 1,750 Units/hr (12/10/13 2032)     Inda Coke MS, RD, LDN Inpatient Registered Dietitian Pager: [phone] After-hours pager: [phone]

## 2013-12-11 NOTE — Clinical Social Work Note (Signed)
CSW continuing to monitor patient's progress and discharge to LTAC being explored and RN case manager has talked with family.  CSW will continue to follow and assist with discharge if needed.  Taleigha Pinson Givens, MSW, LCSW [phone]

## 2013-12-11 NOTE — Progress Notes (Signed)
Progress Note   Subjective  No specific complaints.    Objective   Vital signs in last 24 hours: Temp:  [98 F (36.7 C)-99.2 F (37.3 C)] 98.4 F (36.9 C) (04/03 0953) Pulse Rate:  [100-118] 118 (04/03 0953) Resp:  [17-20] 20 (04/03 0953) BP: (93-108)/(61-71) 105/71 mmHg (04/03 0953) SpO2:  [92 %-97 %] 95 % (04/03 0953) Weight:  [245 lb (111.131 kg)-245 lb 9.6 oz (111.403 kg)] 245 lb (111.131 kg) (04/03 0500) Last BM Date: 12/10/13 General:    white black in NAD Heart:  Regular rate and rhythm Lungs: Respirations even and unlabored Abdomen:  Soft, nontender and nondistended. Normal bowel sounds. Extremities:  Without edema. Neurologic:  Alert and oriented,  grossly normal neurologically. Psych:  Cooperative. Normal mood and affect.  Lab Results:  Recent Labs  12/09/13 0448 12/10/13 0611 12/11/13 0613  WBC 6.6 18.4* 11.9*  HGB 8.1* 10.0* 9.7*  HCT 24.6* 30.0* 28.9*  PLT 386 425* 430*   BMET  Recent Labs  12/09/13 0448 12/10/13 0611 12/11/13 0613  NA 141 139 138  K 3.4* 3.2* 3.3*  CL 104 99 98  CO2 24 23 23   GLUCOSE 87 132* 93  BUN 8 9 14   CREATININE 0.76 0.69 1.04  CALCIUM 8.6 8.2* 8.1*   LFT  Recent Labs  12/11/13 0613  PROT 5.6*  ALBUMIN 1.7*  AST 28  ALT 18  ALKPHOS 57  BILITOT 0.7   PT/INR  Recent Labs  12/09/13 0448 12/10/13 0611  LABPROT 13.5 14.4  INR 1.05 1.14    Studies/Results: Dg Chest 2 View  12/10/2013   CLINICAL DATA:  Leukocytosis  EXAM: CHEST  2 VIEW  COMPARISON:  Prior chest x-ray 11/25/2013  FINDINGS: Unchanged position of enteric feeding tube. The weighted tip appears to project over the proximal small bowel. The nasogastric tube is been removed. Cardiac and mediastinal contours are unchanged. Low inspiratory volumes. No definite focal airspace consolidation. The left upper extremity PICC is been removed.  IMPRESSION: No active cardiopulmonary disease.   Electronically Signed   By: Jacqulynn Cadet M.D.   On:  12/10/2013 23:38   Dg Abd 2 Views  12/10/2013   CLINICAL DATA:  Abdominal pain.  EXAM: ABDOMEN - 2 VIEW  COMPARISON:  None.  FINDINGS: The bowel gas pattern is normal. There is no evidence of free air. No radio-opaque calculi or other significant radiographic abnormality is seen. No evidence free air. Feeding tube is appreciated curled in the expected region of the stomach. Postsurgical changes in the lumbar spine.  IMPRESSION: Negative.   Electronically Signed   By: Margaree Mackintosh M.D.   On: 12/10/2013 12:41   Duodenum, NOS biopsy - BENIGN SMALL BOWEL MUCOSA. - NO ACTIVE INFLAMMATION OR VILLOUS ATROPHY IDENTIFIED. - SEE COMMENT 2. Esophagus, biopsy - FIBRINOPURULENT MATERIAL, CONSISTENT WITH ULCER BED. - SURFACE REFRACTILE FOREIGN MATERIAL. - THERE IS NO EVIDENCE OF GOBLET CELL METAPLASIA, DYSPLASIA, OR MALIGNANCY. - SEE COMMENT.   Assessment / Plan:   Brief history:  43. 63 year old female with prolonged hospitalization. We saw her early March inpatient for low volume hematemesis. No endoscopic workup done. We were reconsulted 11/17/13 for acute on chronic n/v. EGD at that time revealed severe reflux esophagitis and severe duodenitis with 5mm ulcer. Esophageal biopsy c/w ulcer, duodenal biopsies negative.  She had small bowel feedings started middle of last month. She has failed PO trials secondary to dysphagia and n/v.  Surgery evaluated for placement of Jtube but patient  developed abdominal pain and leukocytosis so surgery postponed for now.    Recommend repeat swallowing evaluation by Speech Therapist, +/- modified barium swallow study. If abnormal then may need feeding tube.   If problem is purely nausea /vomiting then will adjust meds and see if we can improve things.   For now will d/c flagyl as it can cause nausea. Will change PPI to IV form.   We need to discontinue narcotics as they can contribute to her nausea  2. Cdiff ( 08/2013, 2/16, 3/7 / 4/2). Patient was treated with flagyl  and Vanco.  Because of abdominal pain, ongoing diarrhea and elevated WBC another sample tested yesterday and was +. Of note, c-diff was negative 3/20 and 3/28.  Hard to know if she has ever really cleared c-diff infection or if this is true relapse. Patient is adamant that her diarrhea has NOT increased recently but been rather consistent the whole time. Wonder if tube feedings contributing to loose stool. Also, she is on Mg+ oxide which can cause diarrhea.  Recommend infectious disease evaluation which I have called.     LOS: 33 days   Tye Savoy  12/11/2013, 2:15 PM  ________________________________________________________________________  Velora Heckler GI MD note:  I personally examined the patient, reviewed the data and agree with the assessment and plan described above. I am new to her but cannot really understand what is keeping her from eating.  I'd like speech to visit and comment on whether is safe for her to swallow (we called them already). If it is, I'd like to retry, encourage PO intake.  She is on narcotic pain meds which can cause nausea.  I've stopped them for now.  She was restarted on flagyl, can cause nausea and I've stopped that.  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 Continue vancomycin via tube for now.  Will change her PPI to IV twice daily rather than PO.  #4 side effect of wellbutrin is nausea, would ask that primary team consider stopping it.  #1 side effect of lexapro is nausea, also ask that primary team consider stopping it. She is on zofran, scheduled dosing, which I agree with (better than PRN dosing). Perhaps her nausea is due to persistent/recurrent C. Difficile?  She is on tussionex and this can cause nausea, drowsiness.  I'm not sure why she is on that but should consider stopping it.   Very difficult situation, but I  recommend against any type of percutaneous feeding tubes until her situation is better understood.  It is very uncommon for someone  to require tube feeds for nausea. I would hope to remove the existing the feeding tube soon rather than have a more permanent one inserted. Should stop any medicine that can contribute to nausea unless it is absolutely needed, should maximize antiacid meds, should re-evaluate swallowing, should have ID help with ? C. Diff infection.    Owens Loffler, MD [facility] Pager [phone]

## 2013-12-11 NOTE — Care Management Note (Addendum)
Ongoing efforts to plan for appropriate level of care for this pt. Noted surgical plan to monitor for 72 hr. Also spoke with [name] GI, NP, and aware of plan to request swallow eval, consult ID re recurrent C-Diff and plan to adjust medications. This CM spoke with pt re possible transfer to [facility], she seems to understand, however wishes to discuss with her family. This CM has placed a call to the pt daughter, [name] and await response, hopefully to be able to discuss LTAC option and allow pt and family to discuss over the weekend for possible transfer on Monday if bed available.  Sydney Pang RN MPH, case manager, [phone]  Addem: 3:35 pm, received return phone call from pt daughter, [name] and discussed the possibility of a transfer to a [facility] facility for ongoing acute care and rehab. She is interested in this possibility and will contact [name] and arrange a tour of that facility over the weekend. I have asked her to make a decision by Monday.  Sydney Pang RN MPH, [phone]

## 2013-12-12 ENCOUNTER — Inpatient Hospital Stay (HOSPITAL_COMMUNITY): Payer: Medicare Other

## 2013-12-12 ENCOUNTER — Other Ambulatory Visit: Payer: Self-pay | Admitting: Pulmonary Disease

## 2013-12-12 ENCOUNTER — Inpatient Hospital Stay (HOSPITAL_COMMUNITY): Payer: Medicare Other | Admitting: Anesthesiology

## 2013-12-12 ENCOUNTER — Encounter (HOSPITAL_COMMUNITY): Payer: Medicare Other | Admitting: Anesthesiology

## 2013-12-12 ENCOUNTER — Encounter (HOSPITAL_COMMUNITY): Admission: EM | Disposition: E | Payer: Self-pay | Source: Home / Self Care | Attending: Internal Medicine

## 2013-12-12 DIAGNOSIS — R109 Unspecified abdominal pain: Secondary | ICD-10-CM

## 2013-12-12 DIAGNOSIS — R579 Shock, unspecified: Secondary | ICD-10-CM

## 2013-12-12 DIAGNOSIS — A0472 Enterocolitis due to Clostridium difficile, not specified as recurrent: Secondary | ICD-10-CM

## 2013-12-12 HISTORY — PX: LAPAROTOMY: SHX154

## 2013-12-12 LAB — HEPATITIS PANEL, ACUTE
HCV Ab: NEGATIVE
Hep A IgM: NONREACTIVE
Hep B C IgM: NONREACTIVE
Hepatitis B Surface Ag: NEGATIVE

## 2013-12-12 LAB — SURGICAL PCR SCREEN
MRSA, PCR: NEGATIVE
Staphylococcus aureus: NEGATIVE

## 2013-12-12 LAB — POCT I-STAT 3, ART BLOOD GAS (G3+)
ACID-BASE DEFICIT: 8 mmol/L — AB (ref 0.0–2.0)
Bicarbonate: 16.4 mEq/L — ABNORMAL LOW (ref 20.0–24.0)
O2 SAT: 95 %
PO2 ART: 75 mmHg — AB (ref 80.0–100.0)
Patient temperature: 98.9
TCO2: 17 mmol/L (ref 0–100)
pCO2 arterial: 28.3 mmHg — ABNORMAL LOW (ref 35.0–45.0)
pH, Arterial: 7.372 (ref 7.350–7.450)

## 2013-12-12 LAB — CBC
HEMATOCRIT: 31.8 % — AB (ref 36.0–46.0)
Hemoglobin: 10.9 g/dL — ABNORMAL LOW (ref 12.0–15.0)
MCH: 30.6 pg (ref 26.0–34.0)
MCHC: 34.3 g/dL (ref 30.0–36.0)
MCV: 89.3 fL (ref 78.0–100.0)
Platelets: 339 10*3/uL (ref 150–400)
RBC: 3.56 MIL/uL — ABNORMAL LOW (ref 3.87–5.11)
RDW: 20.9 % — AB (ref 11.5–15.5)
WBC: 20.9 10*3/uL — ABNORMAL HIGH (ref 4.0–10.5)

## 2013-12-12 LAB — BASIC METABOLIC PANEL
BUN: 25 mg/dL — ABNORMAL HIGH (ref 6–23)
CHLORIDE: 100 meq/L (ref 96–112)
CO2: 19 meq/L (ref 19–32)
Calcium: 7.8 mg/dL — ABNORMAL LOW (ref 8.4–10.5)
Creatinine, Ser: 2.24 mg/dL — ABNORMAL HIGH (ref 0.50–1.10)
GFR calc Af Amer: 26 mL/min — ABNORMAL LOW (ref >90)
GFR calc non Af Amer: 22 mL/min — ABNORMAL LOW (ref >90)
GLUCOSE: 114 mg/dL — AB (ref 70–99)
POTASSIUM: 3.6 meq/L — AB (ref 3.7–5.3)
Sodium: 140 mEq/L (ref 137–147)

## 2013-12-12 LAB — GLUCOSE, CAPILLARY
GLUCOSE-CAPILLARY: 119 mg/dL — AB (ref 70–99)
GLUCOSE-CAPILLARY: 109 mg/dL — AB (ref 70–99)
Glucose-Capillary: 101 mg/dL — ABNORMAL HIGH (ref 70–99)
Glucose-Capillary: 118 mg/dL — ABNORMAL HIGH (ref 70–99)
Glucose-Capillary: 122 mg/dL — ABNORMAL HIGH (ref 70–99)

## 2013-12-12 LAB — TROPONIN I: Troponin I: <0.3 ng/mL (ref <0.30)

## 2013-12-12 LAB — MAGNESIUM: Magnesium: 2.1 mg/dL (ref 1.5–2.5)

## 2013-12-12 LAB — PROCALCITONIN
Procalcitonin: 71.02 ng/mL
Procalcitonin: 61.88 ng/mL

## 2013-12-12 LAB — LACTIC ACID, PLASMA
LACTIC ACID, VENOUS: 6.1 mmol/L — AB (ref 0.5–2.2)
Lactic Acid, Venous: 5.3 mmol/L — ABNORMAL HIGH (ref 0.5–2.2)

## 2013-12-12 LAB — HEPARIN LEVEL (UNFRACTIONATED): HEPARIN UNFRACTIONATED: 0.25 [IU]/mL — AB (ref 0.30–0.70)

## 2013-12-12 LAB — PRO B NATRIURETIC PEPTIDE: PRO B NATRI PEPTIDE: 11542 pg/mL — AB (ref 0–125)

## 2013-12-12 LAB — HIV ANTIBODY (ROUTINE TESTING W REFLEX): HIV: NONREACTIVE

## 2013-12-12 SURGERY — LAPAROTOMY, EXPLORATORY
Anesthesia: General | Site: Abdomen

## 2013-12-12 MED ORDER — EPHEDRINE SULFATE 50 MG/ML IJ SOLN
INTRAMUSCULAR | Status: AC
  Filled 2013-12-12: qty 1

## 2013-12-12 MED ORDER — SODIUM BICARBONATE 4.2 % IV SOLN
INTRAVENOUS | Status: DC | PRN
  Administered 2013-12-12 (×2): 50 meq via INTRAVENOUS

## 2013-12-12 MED ORDER — FENTANYL CITRATE 0.05 MG/ML IJ SOLN
INTRAMUSCULAR | Status: DC | PRN
  Administered 2013-12-12: 100 ug via INTRAVENOUS

## 2013-12-12 MED ORDER — MORPHINE SULFATE 2 MG/ML IJ SOLN
2.0000 mg | INTRAMUSCULAR | Status: DC | PRN
  Administered 2013-12-12: 2 mg via INTRAVENOUS
  Filled 2013-12-12: qty 1

## 2013-12-12 MED ORDER — ROCURONIUM BROMIDE 50 MG/5ML IV SOLN
INTRAVENOUS | Status: AC
  Filled 2013-12-12: qty 1

## 2013-12-12 MED ORDER — SODIUM CHLORIDE 0.9 % IV BOLUS (SEPSIS): 500.0000 mL | Freq: Once | INTRAVENOUS | Status: DC

## 2013-12-12 MED ORDER — METRONIDAZOLE IN NACL 5-0.79 MG/ML-% IV SOLN
500.0000 mg | Freq: Four times a day (QID) | INTRAVENOUS | Status: DC
  Administered 2013-12-12 – 2013-12-13 (×2): 500 mg via INTRAVENOUS
  Filled 2013-12-12 (×5): qty 100

## 2013-12-12 MED ORDER — PHENYLEPHRINE HCL 10 MG/ML IJ SOLN
10.0000 mg | INTRAVENOUS | Status: DC | PRN
  Administered 2013-12-12: 50 ug/min via INTRAVENOUS

## 2013-12-12 MED ORDER — DEXTROSE 5 % IV SOLN
1.0000 g | INTRAVENOUS | Status: DC
  Administered 2013-12-12: 1 g via INTRAVENOUS
  Filled 2013-12-12: qty 1

## 2013-12-12 MED ORDER — 0.9 % SODIUM CHLORIDE (POUR BTL) OPTIME
TOPICAL | Status: DC | PRN
  Administered 2013-12-12: 1000 mL

## 2013-12-12 MED ORDER — NOREPINEPHRINE BITARTRATE 1 MG/ML IJ SOLN
2.0000 ug/min | INTRAVENOUS | Status: DC
  Administered 2013-12-12: 10 ug/min via INTRAVENOUS
  Filled 2013-12-12 (×3): qty 4

## 2013-12-12 MED ORDER — ETOMIDATE 2 MG/ML IV SOLN
INTRAVENOUS | Status: DC | PRN
Start: 2013-12-12 — End: 2013-12-13
  Administered 2013-12-12: 12 mg via INTRAVENOUS

## 2013-12-12 MED ORDER — FENTANYL CITRATE 0.05 MG/ML IJ SOLN
100.0000 ug | Freq: Once | INTRAMUSCULAR | Status: AC
  Administered 2013-12-12: 100 ug via INTRAVENOUS
  Filled 2013-12-12: qty 2

## 2013-12-12 MED ORDER — SUCCINYLCHOLINE CHLORIDE 20 MG/ML IJ SOLN
INTRAMUSCULAR | Status: AC
  Filled 2013-12-12: qty 1

## 2013-12-12 MED ORDER — FAMOTIDINE IN NACL 20-0.9 MG/50ML-% IV SOLN
20.0000 mg | Freq: Two times a day (BID) | INTRAVENOUS | Status: DC
  Administered 2013-12-12: 20 mg via INTRAVENOUS
  Filled 2013-12-12 (×3): qty 50

## 2013-12-12 MED ORDER — SODIUM CHLORIDE 0.9 % IJ SOLN
INTRAMUSCULAR | Status: AC
  Filled 2013-12-12: qty 10

## 2013-12-12 MED ORDER — SODIUM CHLORIDE 0.9 % IV BOLUS (SEPSIS)
500.0000 mL | Freq: Once | INTRAVENOUS | Status: AC
  Administered 2013-12-12: 500 mL via INTRAVENOUS

## 2013-12-12 MED ORDER — SODIUM CHLORIDE 0.9 % IV BOLUS (SEPSIS)
1000.0000 mL | Freq: Once | INTRAVENOUS | Status: AC
  Administered 2013-12-12: 1000 mL via INTRAVENOUS

## 2013-12-12 MED ORDER — FENTANYL CITRATE 0.05 MG/ML IJ SOLN
INTRAMUSCULAR | Status: AC
  Filled 2013-12-12: qty 5

## 2013-12-12 MED ORDER — FIDAXOMICIN 200 MG PO TABS
200.0000 mg | ORAL_TABLET | Freq: Two times a day (BID) | ORAL | Status: DC
  Administered 2013-12-12: 200 mg via ORAL
  Filled 2013-12-12 (×3): qty 1

## 2013-12-12 MED ORDER — ETOMIDATE 2 MG/ML IV SOLN
INTRAVENOUS | Status: AC
Start: 2013-12-12 — End: 2013-12-12
  Filled 2013-12-12: qty 10

## 2013-12-12 MED ORDER — ROCURONIUM BROMIDE 100 MG/10ML IV SOLN
INTRAVENOUS | Status: DC | PRN
  Administered 2013-12-12: 50 mg via INTRAVENOUS

## 2013-12-12 MED ORDER — SUCCINYLCHOLINE CHLORIDE 20 MG/ML IJ SOLN
INTRAMUSCULAR | Status: DC | PRN
  Administered 2013-12-12: 100 mg via INTRAVENOUS

## 2013-12-12 MED ORDER — SODIUM CHLORIDE 0.9 % IV SOLN
INTRAVENOUS | Status: DC
  Administered 2013-12-12 (×3): via INTRAVENOUS

## 2013-12-12 MED ORDER — ALBUMIN HUMAN 5 % IV SOLN
INTRAVENOUS | Status: DC | PRN
  Administered 2013-12-12 – 2013-12-13 (×3): via INTRAVENOUS

## 2013-12-12 SURGICAL SUPPLY — 51 items
BLADE SURG ROTATE 9660 (MISCELLANEOUS) IMPLANT
CANISTER SUCTION 2500CC (MISCELLANEOUS) ×3 IMPLANT
CHLORAPREP W/TINT 26ML (MISCELLANEOUS) ×3 IMPLANT
COVER MAYO STAND STRL (DRAPES) ×4 IMPLANT
COVER SURGICAL LIGHT HANDLE (MISCELLANEOUS) ×3 IMPLANT
DRAPE LAPAROSCOPIC ABDOMINAL (DRAPES) ×3 IMPLANT
DRAPE PROXIMA HALF (DRAPES) IMPLANT
DRAPE UTILITY 15X26 W/TAPE STR (DRAPE) ×6 IMPLANT
DRAPE WARM FLUID 44X44 (DRAPE) ×3 IMPLANT
DRSG OPSITE POSTOP 4X10 (GAUZE/BANDAGES/DRESSINGS) IMPLANT
DRSG OPSITE POSTOP 4X8 (GAUZE/BANDAGES/DRESSINGS) IMPLANT
ELECT BLADE 6.5 EXT (BLADE) ×2 IMPLANT
ELECT CAUTERY BLADE 6.4 (BLADE) ×6 IMPLANT
ELECT REM PT RETURN 9FT ADLT (ELECTROSURGICAL) ×3
ELECTRODE REM PT RTRN 9FT ADLT (ELECTROSURGICAL) ×1 IMPLANT
GLOVE BIO SURGEON STRL SZ7 (GLOVE) ×9 IMPLANT
GLOVE BIOGEL PI IND STRL 7.5 (GLOVE) ×1 IMPLANT
GLOVE BIOGEL PI INDICATOR 7.5 (GLOVE) ×6
GOWN STRL REUS W/ TWL LRG LVL3 (GOWN DISPOSABLE) ×3 IMPLANT
GOWN STRL REUS W/TWL LRG LVL3 (GOWN DISPOSABLE) ×9
KIT BASIN OR (CUSTOM PROCEDURE TRAY) ×3 IMPLANT
KIT ROOM TURNOVER OR (KITS) ×3 IMPLANT
LIGASURE IMPACT 36 18CM CVD LR (INSTRUMENTS) ×2 IMPLANT
NS IRRIG 1000ML POUR BTL (IV SOLUTION) ×6 IMPLANT
PACK GENERAL/GYN (CUSTOM PROCEDURE TRAY) ×3 IMPLANT
PAD ARMBOARD 7.5X6 YLW CONV (MISCELLANEOUS) ×3 IMPLANT
PENCIL BUTTON HOLSTER BLD 10FT (ELECTRODE) ×2 IMPLANT
RELOAD PROXIMATE 75MM BLUE (ENDOMECHANICALS) ×3 IMPLANT
RELOAD STAPLE 75 3.8 BLU REG (ENDOMECHANICALS) IMPLANT
SPECIMEN JAR LARGE (MISCELLANEOUS) IMPLANT
SPONGE ABDOMINAL VAC ABTHERA (MISCELLANEOUS) ×2 IMPLANT
SPONGE LAP 18X18 X RAY DECT (DISPOSABLE) ×6 IMPLANT
STAPLER GUN LINEAR PROX 60 (STAPLE) ×2 IMPLANT
STAPLER PROXIMATE 75MM BLUE (STAPLE) ×2 IMPLANT
STAPLER VISISTAT 35W (STAPLE) ×3 IMPLANT
SUCTION POOLE TIP (SUCTIONS) ×3 IMPLANT
SUT PDS AB 1 TP1 96 (SUTURE) ×6 IMPLANT
SUT SILK 2 0 (SUTURE) ×3
SUT SILK 2 0 SH CR/8 (SUTURE) ×3 IMPLANT
SUT SILK 2-0 18XBRD TIE 12 (SUTURE) ×1 IMPLANT
SUT SILK 3 0 (SUTURE) ×3
SUT SILK 3 0 SH CR/8 (SUTURE) ×3 IMPLANT
SUT SILK 3-0 18XBRD TIE 12 (SUTURE) ×1 IMPLANT
SUT VIC AB 3-0 SH 18 (SUTURE) ×2 IMPLANT
SUT VIC AB 3-0 SH 27 (SUTURE)
SUT VIC AB 3-0 SH 27X BRD (SUTURE) IMPLANT
TOWEL OR 17X26 10 PK STRL BLUE (TOWEL DISPOSABLE) ×3 IMPLANT
TRAY FOLEY CATH 16FRSI W/METER (SET/KITS/TRAYS/PACK) IMPLANT
TUBE CONNECTING 12'X1/4 (SUCTIONS) ×1
TUBE CONNECTING 12X1/4 (SUCTIONS) ×1 IMPLANT
YANKAUER SUCT BULB TIP NO VENT (SUCTIONS) ×2 IMPLANT

## 2013-12-12 NOTE — Progress Notes (Signed)
20 Days Post-Op  Subjective: Abdominal pain much increased  Objective: Vital signs in last 24 hours: Temp:  [97.4 F (36.3 C)-97.9 F (36.6 C)] 97.5 F (36.4 C) (04/04 2030) Pulse Rate:  [83-131] 130 (04/04 2145) Resp:  [17-54] 39 (04/04 2145) BP: (56-110)/(13-90) 82/39 mmHg (04/04 2115) SpO2:  [96 %-100 %] 100 % (04/04 2145) Weight:  [254 lb 13.6 oz (115.6 kg)] 254 lb 13.6 oz (115.6 kg) (04/04 1900) Last BM Date: 01/05/2014  Intake/Output from previous day: 04/03 0701 - 04/04 0700 In: -  Out: 1070 [Urine:1070] Intake/Output this shift: Total I/O In: 694.4 [I.V.:514.4; NG/GT:30; IV Piggyback:150] Out: 10 [Urine:10]  General appearance: toxic GI: diffusely tender with peritoneal signs, no bs  Lab Results:   Recent Labs  12/11/13 0613  0900  WBC 11.9* 20.9*  HGB 9.7* 10.9*  HCT 28.9* 31.8*  PLT 430* 339   BMET  Recent Labs  12/11/13 0613 12/11/2013 0900  NA 138 140  K 3.3* 3.6*  CL 98 100  CO2 23 19  GLUCOSE 93 114*  BUN 14 25*  CREATININE 1.04 2.24*  CALCIUM 8.1* 7.8*   PT/INR  Recent Labs  12/10/13 0611  LABPROT 14.4  INR 1.14   ABG  Recent Labs  12/19/2013 1831  PHART 7.372  HCO3 16.4*    Studies/Results: Ct Abdomen Pelvis Wo Contrast  12/24/2013   CLINICAL DATA:  Abdominal pain  EXAM: CT ABDOMEN AND PELVIS WITHOUT CONTRAST  TECHNIQUE: Multidetector CT imaging of the abdomen and pelvis was performed following the standard protocol without intravenous contrast.  COMPARISON:  None.  FINDINGS: The lung bases are free of acute infiltrate or sizable effusion. Mild atelectatic changes are noted.  The liver, gallbladder, spleen, adrenal glands and pancreas are within normal limits. The feeding catheter is noted within the fourth portion of the duodenum. The kidneys are well visualized and show no renal calculi or urinary tract obstructive changes.  The bladder is decompressed by Foley catheter. There is diffuse colonic wall thickening identified  consistent with a colitis. Pericolonic inflammatory changes are noted No perforation or abscess is identified. The appendix is not well visualized. No findings to suggest retroperitoneal hemorrhage are noted. No all pelvic mass lesion is seen. The osseous structures show changes of prior lumbar surgery. No acute bony abnormality is seen.  IMPRESSION: Diffuse colonic wall thickening with pericolonic inflammatory change consistent with colitis. No definitive perforation is seen.  The appendix is not well visualized.   Electronically Signed   By: Inez Catalina M.D.   On: 01/03/2014 10:56   Dg Chest 2 View  12/10/2013   CLINICAL DATA:  Leukocytosis  EXAM: CHEST  2 VIEW  COMPARISON:  Prior chest x-ray 11/25/2013  FINDINGS: Unchanged position of enteric feeding tube. The weighted tip appears to project over the proximal small bowel. The nasogastric tube is been removed. Cardiac and mediastinal contours are unchanged. Low inspiratory volumes. No definite focal airspace consolidation. The left upper extremity PICC is been removed.  IMPRESSION: No active cardiopulmonary disease.   Electronically Signed   By: Jacqulynn Cadet M.D.   On: 12/10/2013 23:38   Dg Chest Port 1 View  12/12/2013   CLINICAL DATA:  Central venous line placement  EXAM: PORTABLE CHEST - 1 VIEW  COMPARISON:  12/10/2013  FINDINGS: Cardiomediastinal silhouette is stable. NG tube in place. There is left IJ central line with tip in SVC right atrium junction. No pneumothorax.  IMPRESSION: Left IJ central line in place.  No pneumothorax.  Electronically Signed   By: Lahoma Crocker M.D.   On: 01/01/2014 17:42   Dg Abd Portable 2v  12/09/2013   CLINICAL DATA:  Pain.  Evaluate for free air.  EXAM: PORTABLE ABDOMEN - 2 VIEW  COMPARISON:  CT ABD/PELV WO CM dated 12/16/2013  FINDINGS: Dobbhoff tube noted in stable position with tip in the duodenum. Distended loops of colon are noted. No free air is identified. Subtle air collection noted noted in the medial right  upper quadrant projected over the liver is most likely air within the colon as a similar gas pattern in this region on the recent CT. No other scattered air noted throughout the distribution of the liver to suggested this is portal venous air. If the patient's symptoms persist a repeat CT can be obtained. Prior lumbar spine fusion.  IMPRESSION: 1. Dobbhoff tube with tip in duodenum. 2. Distended colon. No free air. If symptoms persist a repeat CT can be obtained as described above.   Electronically Signed   By: Marcello Moores  Register   On: 01/01/2014 21:48    Anti-infectives: Anti-infectives   Start     Dose/Rate Route Frequency Ordered Stop   01/17/14 1000  vancomycin (VANCOCIN) 50 mg/mL oral solution 125 mg  Status:  Discontinued     125 mg Oral Every 3 DAYS 12/11/13 1736 01/03/2014 1822   01/09/14 1000  vancomycin (VANCOCIN) 50 mg/mL oral solution 125 mg  Status:  Discontinued     125 mg Oral Every other day 12/11/13 1736 12/15/2013 1822   01/02/14 1000  vancomycin (VANCOCIN) 50 mg/mL oral solution 125 mg  Status:  Discontinued     125 mg Oral Daily 12/11/13 1736 12/27/2013 1822   12/25/13 2200  vancomycin (VANCOCIN) 50 mg/mL oral solution 125 mg  Status:  Discontinued     125 mg Oral 2 times daily 12/11/13 1736 01/04/2014 1822   01/02/2014 2130  ceFEPIme (MAXIPIME) 1 g in dextrose 5 % 50 mL IVPB     1 g 100 mL/hr over 30 Minutes Intravenous Every 24 hours 12/19/2013 2101     12/21/2013 1930  fidaxomicin (DIFICID) tablet 200 mg     200 mg Oral 2 times daily 12/15/2013 1822     01/01/2014 1900  metroNIDAZOLE (FLAGYL) IVPB 500 mg     500 mg 100 mL/hr over 60 Minutes Intravenous Every 6 hours 12/09/2013 1821     12/11/13 1800  vancomycin (VANCOCIN) 50 mg/mL oral solution 125 mg  Status:  Discontinued     125 mg Oral 4 times daily 12/11/13 1736 12/26/2013 1822   12/11/13 1230  metroNIDAZOLE (FLAGYL) IVPB 500 mg  Status:  Discontinued     500 mg 100 mL/hr over 60 Minutes Intravenous Every 8 hours 12/11/13 1222 12/11/13  1508   12/11/13 1200  vancomycin (VANCOCIN) 50 mg/mL oral solution 125 mg  Status:  Discontinued     125 mg Oral 4 times daily 12/11/13 0953 12/11/13 1736   12/01/2013 2200  cefTRIAXone (ROCEPHIN) injection 1 g  Status:  Discontinued     1 g Intramuscular Daily at bedtime 11/09/2013 1714 11/12/2013 1748   12/08/2013 2200  cefTRIAXone (ROCEPHIN) 1 g in dextrose 5 % 50 mL IVPB  Status:  Discontinued     1 g 100 mL/hr over 30 Minutes Intravenous Every 24 hours 11/09/2013 1748 11/19/13 1332   11/23/2013 1830  azithromycin (ZITHROMAX) tablet 500 mg  Status:  Discontinued     500 mg Oral Daily 12/03/2013 1726 11/19/13 1331  12/05/2013 1800  vancomycin (VANCOCIN) 125 MG capsule 125 mg  Status:  Discontinued     125 mg Oral 4 times daily 11/24/2013 1714 12/07/2013 1725   11/27/2013 1715  azithromycin (ZITHROMAX) tablet 500 mg  Status:  Discontinued     500 mg Oral Daily 11/09/2013 1714 11/20/2013 1724   11/15/13 1700  piperacillin-tazobactam (ZOSYN) IVPB 3.375 g  Status:  Discontinued     3.375 g 12.5 mL/hr over 240 Minutes Intravenous Every 8 hours 11/15/13 1616 11/17/13 1531   11/14/13 2000  metroNIDAZOLE (FLAGYL) IVPB 500 mg     500 mg 100 mL/hr over 60 Minutes Intravenous 3 times per day 11/14/13 1854 11/28/13 2159   11/14/13 1945  vancomycin (VANCOCIN) 50 mg/mL oral solution 500 mg     500 mg Oral 4 times per day 11/14/13 1854 11/28/13 1759   11/09/13 0500  vancomycin (VANCOCIN) IVPB 1000 mg/200 mL premix  Status:  Discontinued     1,000 mg 200 mL/hr over 60 Minutes Intravenous Every 12 hours 11/30/2013 1743 11/12/13 0506   11/09/13 0100  piperacillin-tazobactam (ZOSYN) IVPB 3.375 g  Status:  Discontinued     3.375 g 12.5 mL/hr over 240 Minutes Intravenous 3 times per day 11/21/2013 1743 11/13/13 1049   11/22/2013 1600  vancomycin (VANCOCIN) 2,268 mg in sodium chloride 0.9 % 500 mL IVPB  Status:  Discontinued     20 mg/kg  113.4 kg 250 mL/hr over 120 Minutes Intravenous  Once 11/14/2013 1548 11/23/2013 1549   11/10/2013  1600  piperacillin-tazobactam (ZOSYN) IVPB 3.375 g     3.375 g 100 mL/hr over 30 Minutes Intravenous  Once 11/14/2013 1548 11/12/13 1200   11/11/2013 1600  vancomycin (VANCOCIN) 2,000 mg in sodium chloride 0.9 % 500 mL IVPB     2,000 mg 250 mL/hr over 120 Minutes Intravenous  Once 11/15/2013 1550 11/12/13 1200      Assessment/Plan: Fulminant c diff  She appears to have rapidly progressive c diff.  I would like to continue medical mgt with her but these are following factors making me recommend surgery: [id number], cr tripled over last few days, no more diarrhea, peritoneal signs, lactate elevated, increasing pressor requirement, need for intubation now.  I think if we observe her that her mortality will be much higher than with surgery. I think subtotal colectomy is best choice as opposed to ileostomy/colonic lavage at this point.  There is more data to support this also.  I have discussed all this with patient, her sister and her son.  Will proceed asap.  We discussed high mortality risk even with surgery and complications that include but are not limited to bleeding, infection, reoperation, open abdomen with planned reoperation, long term mech ventilation, stroke, dvt/pe, cardiac complications.     [facility] 12/15/2013

## 2013-12-12 NOTE — Progress Notes (Signed)
Patient transferred to 2H17. Report called prior to transfer. Patients sister was at bedside and other family members were aware. Patients belongings were transferred with patient.
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
## 2013-12-12 NOTE — Progress Notes (Addendum)
[facility] PROGRESS NOTE  [name] [id number] DOB: Jun 11, 1951 DOA: 11/09/2013 PCP: [name], MD  Assessment/Plan:  Hypotension;  Respond to 1 L IV bolus. SBP increased to 100 from 70.  Will check stat Hb.  Will also check Ct abdomen to rule out bleed. Complaining of back pain and abdominal pain.  Check lactic acid.  Low threshold to transfer to step down in hypotension reoccurs.   Addendum:  Recurrent hypotension. Will give 500 cc IV bolus. Lactic acid pending. CT abdomen negative for retroperitoneal hematoma. HB stable.  Patient complaining of dyspnea. Renal function worse today. CCM consulted. Patient might need pressors if BP doesn't respond to fluids.   C. difficile colitis: Recurrent or partially treated.  -Received treatment for C diff with vancomycin and flagyl.  -(GI) conducted Colonoscopy on 3/15, which showed significant pathology see report below, awaiting pathology reports  -metronidazole and vancomycin stopped on 3/21.  -3/31 pathology from colonoscopy on 3/15 and showed no malignancy see report below  -repeat C diff on 4-2 positive.  -Restarted vancomycin 4-3. Day 2.  -Appreciate Dr Marissa Nestle and Dr Edison Nasuti recommendation.   Dysphagia, nausea, vomiting.  -Patient has significant pathology; esophagitis, duodenal ulcer, duodenitis.  -3./17 Dobhoff tube successfully placed -3/25 patient has been unable to tolerate clear liquid diet; resulted in gagging/nausea/vomiting. Patient to be made n.p.o. and resume tube feedings Osmolite 1.2 CAL dietitian to manage  -Appreciate Dr Ardis Hughs recommendation. Hold on J tube placement. Treat C diff. Speech Swallow evaluation. I have discontinue Wellbutrin, tussionex.   -Continue with schedule Zofran.   Leukocytosis;  WBC decreased from 18 to 11.9 Chest x ray negative for PNA.  C diff positive.   Left upper extremity DVT 3/20  - Patient is currently on heparin and will bridge to Coumadin once issue of J-tube  placement resolved  -See upper GI bleed  -Monitor for bleeding.  -UA with positive nitrates.   Acute respiratory failure:  -Secondary to aspiration pneumonia.  -clinically improved  -Continue NG tube -3/17 patient S/P fluoroscopic placement of a Dobhoff tube   Upper GI bleed:  -Endoscopy:  esophagitis, duodenal ulcer, duodenitis. See EGD report below  -Continue All medication to be administered via Dobhoff tube (carafate, PPI po bid, bid pepcid, florastor)    Aspiration Pneumonia:  -3/16 unable to continue NG tube feeding secondary to large residuals. Will need to place Dobhoff tube in the a.m Ensuring termination in the duodenum  -3/17 S/P IR placement of Dobhoff tube with termination in the duodenum   Depression  -continue Lexapro, , via NG tube   Hypothyroidism:  -Synthroid 88 mcg per NG tube    Hypertension:  Hold  metoprolol 12.5 mg BID.   Hx psychosis:  -Continue with Seroquel, Lamictal and Lexapro. Will ask pharmacy to see if liquid form are available.   Hypernatremia:  - Resolved   Hypokalemia  Replete oral.   Hypomagnesemia:  Replete IV. Hold mg oxide due to diarrhea.   Nausea vomiting  -Most likely secondary to gastroparesis + patient significant pathology i.e. sophagitis/duodenitis/duodenal ulcer.  -3/25 DC Reglan 5 mg QID secondary to watery diarrhea starting 3/23  -3/30 repeat C. difficile by PCR on 3/28 negative  -4/1 Continue scheduled Zofran, Thorazine 25 mg BID PRN intractable N./V.     Code Status: full code.  Family Communication: care discussed with patient.  Disposition Plan: to be determine.    Consultants: Dr. Delfin Edis (GI)  Dr. Barbie Banner (interventional radiologist   Procedures: 3/15/ 2015 colonoscopy pathology  results  1. Colon, biopsy, Right Ascending  - BENIGN COLONIC MUCOSA WITH FOCAL ISCHEMIC-LIKE CHANGES.  - THERE IS NO EVIDENCE OF IDIOPATHIC INFLAMMATORY BOWEL DISEASE, DYSPLASIA OR  MALIGNANCY.  2. Colon, biopsy, Left  Descending  - BENIGN COLONIC MUCOSA WITH FOCAL ISCHEMIC-LIKE CHANGES.  - THERE IS NO EVIDENCE OF IDIOPATHIC INFLAMMATORY BOWEL DISEASE, DYSPLASIA OR  MALIGNANCY.  3. Colon, polyp(s), Left Descending at 50cm  - TUBULAR ADENOMA(S).  - HIGH GRADE DYSPLASIA IS NOT IDENTIFIED.   Left upper extremity venous duplex on 11/27/2013  Consistent with acute deep and superficialvein thrombosis involving the left subclavian vein, left axillary vein, left brachial vein, and left cephalic vein.  Colonoscopy with cold biopsy polypectomy and Colonoscopy with biopsy 12/04/2013  -Two sessile polyps ranging between 3-37mm in size were found in the descending colon; polypectomy was performed with cold forceps  -There was mild diverticulosis noted in the sigmoid colon  - .diffuse nonspecific colitis throughout the lef,t transverse and right colon consistent with resolving colitis, status post random biopsies ,no evidence of pseudomembrane  - Cecal ulcerations likely resolving colitis. Biopsies taken to rule out ischemic etiology versus pseudomembranous colitis    EGD 12/06/2013  -severe grade 4 reflux esophagitis status post biopsies  -Mild gastric retention  -Small duodenal ulcer without stigmata of bleeding  -Moderately severe duodenitis status post biopsies to rule out H. Pylori  Left PICC/Midline 3/13>> stopped 3/20  NG tube 3/13>>>  Intubation 3/2-3/5  NG tube 3/2-3/5  Right IJ central line 3/1-3/6  Echocardiogram 11/09/2013  - Left ventricle: The cavity size was normal. mild LVH.  -LVEF; 55% to 60%. - Atrial septum: No defect or patent foramen ovale was identified. - Pulmonary arteries: PA peak pressure: 25mm Hg (S).    Antibiotics: IV Zosyn 3/1-stopped 3/10  IV Flagyl 3/7>> stopped 3/21  PO vancomycin 3/7>> stopped 3/21   HPI/Subjective: Patient became dizzy, patient was guided to the floor by the nurse tech. Patient BP was 74.  Patient has received 1 L NS. SBP now 100.  Patient complaining  of abdominal pain and back pain.  No nausea, no vomiting today.  No significant diarrhea today. Had 3 BM yesterday.    Objective: Filed Vitals:   12/22/2013 0722  BP: 72/48  Pulse: 98  Temp:   Resp:     Intake/Output Summary (Last 24 hours) at 12/28/2013 0832 Last data filed at 12/11/13 1700  Gross per 24 hour  Intake      0 ml  Output    250 ml  Net   -250 ml   Filed Weights   12/10/13 2136 12/11/13 0500 12/11/13 1955  Weight: 111.403 kg (245 lb 9.6 oz) 111.131 kg (245 lb) 110.542 kg (243 lb 11.2 oz)    Exam:   General:  No distress.   Cardiovascular: S 1, S 2 RRR  Respiratory: CTA  Abdomen: Bs present, soft, generalized tenderness.   Musculoskeletal: trace edema.   Data Reviewed: Basic Metabolic Panel:  Recent Labs Lab 12/07/13 0641 12/08/13 0715 12/09/13 0448 12/10/13 0611 12/11/13 0613  NA 143 141 141 139 138  K 3.9 3.7 3.4* 3.2* 3.3*  CL 106 104 104 99 98  CO2 28 26 24 23 23   GLUCOSE 98 95 87 132* 93  BUN 6 7 8 9 14   CREATININE 0.81 0.81 0.76 0.69 1.04  CALCIUM 8.3* 8.4 8.6 8.2* 8.1*  MG 1.6 1.4* 1.4* 1.5 2.0   Liver Function Tests:  Recent Labs Lab 12/07/13 1696 12/08/13 0715 12/09/13 0448  12/10/13 0611 12/11/13 0613  AST 17 15 21 29 28   ALT 8 8 9 13 18   ALKPHOS 64 63 63 72 57  BILITOT <0.2* <0.2* <0.2* 0.3 0.7  PROT 5.5* 5.4* 5.6* 6.0 5.6*  ALBUMIN 1.9* 1.9* 2.1* 2.1* 1.7*   No results found for this basename: LIPASE, AMYLASE,  in the last 168 hours No results found for this basename: AMMONIA,  in the last 168 hours CBC:  Recent Labs Lab 12/06/13 0500 12/07/13 0641 12/08/13 0715 12/09/13 0448 12/10/13 0611 12/11/13 0613  WBC 6.6 6.6 7.3 6.6 18.4* 11.9*  NEUTROABS 3.0 3.1  --  2.5 14.9* 8.1*  HGB 8.1* 8.1* 8.1* 8.1* 10.0* 9.7*  HCT 23.7* 24.8* 24.5* 24.6* 30.0* 28.9*  MCV 91.2 92.2 92.8 91.4 92.0 91.7  PLT 351 390 356 386 425* 430*   Cardiac Enzymes: No results found for this basename: CKTOTAL, CKMB, CKMBINDEX,  TROPONINI,  in the last 168 hours BNP (last 3 results)  Recent Labs  11/10/2013 1512  PROBNP 1572.0*   CBG:  Recent Labs Lab 12/11/13 1651 12/11/13 1954 12/11/13 2354 12/24/2013 0434 01/05/2014 0755  GLUCAP 81 80 94 101* 119*    Recent Results (from the past 240 hour(s))  CLOSTRIDIUM DIFFICILE BY PCR     Status: None   Collection Time    12/05/13  8:11 PM      Result Value Ref Range Status   C difficile by pcr NEGATIVE  NEGATIVE Final  MRSA PCR SCREENING     Status: None   Collection Time    12/09/13  1:13 PM      Result Value Ref Range Status   MRSA by PCR NEGATIVE  NEGATIVE Final   Comment:            The GeneXpert MRSA Assay (FDA     approved for NASAL specimens     only), is one component of a     comprehensive MRSA colonization     surveillance program. It is not     intended to diagnose MRSA     infection nor to guide or     monitor treatment for     MRSA infections.  CLOSTRIDIUM DIFFICILE BY PCR     Status: Abnormal   Collection Time    12/10/13  5:34 PM      Result Value Ref Range Status   C difficile by pcr POSITIVE (*) NEGATIVE Final   Comment: CRITICAL RESULT CALLED TO, READ BACK BY AND VERIFIED WITH:     T.HUBBARD,RN 12/11/13 0918 BY BSLADE     Studies: Dg Chest 2 View  12/10/2013   CLINICAL DATA:  Leukocytosis  EXAM: CHEST  2 VIEW  COMPARISON:  Prior chest x-ray 11/25/2013  FINDINGS: Unchanged position of enteric feeding tube. The weighted tip appears to project over the proximal small bowel. The nasogastric tube is been removed. Cardiac and mediastinal contours are unchanged. Low inspiratory volumes. No definite focal airspace consolidation. The left upper extremity PICC is been removed.  IMPRESSION: No active cardiopulmonary disease.   Electronically Signed   By: Jacqulynn Cadet M.D.   On: 12/10/2013 23:38   Dg Abd 2 Views  12/10/2013   CLINICAL DATA:  Abdominal pain.  EXAM: ABDOMEN - 2 VIEW  COMPARISON:  None.  FINDINGS: The bowel gas pattern is normal.  There is no evidence of free air. No radio-opaque calculi or other significant radiographic abnormality is seen. No evidence free air. Feeding tube is appreciated curled in the  expected region of the stomach. Postsurgical changes in the lumbar spine.  IMPRESSION: Negative.   Electronically Signed   By: Margaree Mackintosh M.D.   On: 12/10/2013 12:41    Scheduled Meds: . chlorproMAZINE (THORAZINE) IV  25 mg Intravenous Once  . enoxaparin (LOVENOX) injection  110 mg Subcutaneous BID  . escitalopram  20 mg Oral Daily  . lamoTRIgine  200 mg Oral Daily  . levothyroxine  88 mcg Oral QAC breakfast  . ondansetron (ZOFRAN) IV  4 mg Intravenous 4 times per day  . pantoprazole (PROTONIX) IV  40 mg Intravenous Q12H  . QUEtiapine  100 mg Oral BID  . sodium chloride  500 mL Intravenous Once  . sucralfate  1 g Oral TID AC & HS  . vancomycin  125 mg Oral QID   Followed by  . [START ON 12/25/2013] vancomycin  125 mg Oral BID   Followed by  . [START ON 01/02/2014] vancomycin  125 mg Oral Daily   Followed by  . [START ON 01/09/2014] vancomycin  125 mg Oral QODAY   Followed by  . [START ON 01/17/2014] vancomycin  125 mg Oral Q3 days   Continuous Infusions: . sodium chloride    . feeding supplement (VITAL AF 1.2 CAL) 1,000 mL (12/11/13 0902)    Principal Problem:   Severe Clostridium difficile enterocolitis Active Problems:   Acute respiratory failure   Hematemesis   Aspiration pneumonia   Dysphagia, pharyngoesophageal phase   H/O psychosis   Pulmonary hypertension   Duodenal ulcer, acute   Reflux esophagitis   Other and unspecified noninfectious gastroenteritis and colitis(558.9)   Ulceration of intestine   Benign neoplasm of colon   DVT (deep venous thrombosis)   Nausea alone    Time spent: 35 minutes.     Niel Hummer A  [facility] Pager [phone]. If 7PM-7AM, please contact night-coverage at [email or web address], password [facility] 12/21/2013, 8:32 AM  LOS: 34 days

## 2013-12-12 NOTE — Procedures (Signed)
Arterial Catheter Insertion Procedure Note [name] [id number] [birth year]  Procedure: Insertion of Arterial Catheter  Indications: Blood pressure monitoring and Frequent blood sampling  Procedure Details Consent: Risks of procedure as well as the alternatives and risks of each were explained to the (patient/caregiver).  Consent for procedure obtained. Time Out: Verified patient identification, verified procedure, site/side was marked, verified correct patient position, special equipment/implants available, medications/allergies/relevent history reviewed, required imaging and test results available.  Performed  Maximum sterile technique was used including antiseptics, cap, gloves, gown, hand hygiene, mask and sheet. Skin prep: Chlorhexidine; local anesthetic administered 20 gauge catheter was inserted into right radial artery using the Seldinger technique.  Evaluation Blood flow good; BP tracing good. Complications: No apparent complications. Rt placed arrow catheter on first attempt.   [name] 01/03/2014

## 2013-12-12 NOTE — Progress Notes (Signed)
Was the fall witnessed: Yes  Patient condition before and after the fall: Patient was being transferred back to bed from the bedside commode by nurse tech. She stated that she felt dizzy and her legs gave way. She was guided to the floor by the nurse tech. She has no complaints of pain or discomforts after the fall. Neuro check unremarkable. Pupils equal and reactive to light and no facial droop. Weak grips are consistent with pre-fall baseline. Patient remains alert and oriented. She continues to complain of dizziness. Vital signs with blood pressure of 74/42. On-call practitioner notified and order received. Patient was helped back to bed.   Patient's reaction to the fall: States she did not want to get up from bed again.  Name of the doctor that was notified including date and time: Kathline Magic @ 3212  Any interventions and vital signs: 500cc normal saline bolus ordered and given. Blood pressure to be rechecked

## 2013-12-12 NOTE — Anesthesia Procedure Notes (Signed)
Procedure Name: Intubation Date/Time: 01/05/2014 10:56 PM Performed by: Valetta Fuller Pre-anesthesia Checklist: Patient identified, Emergency Drugs available, Suction available and Patient being monitored Patient Re-evaluated:Patient Re-evaluated prior to inductionOxygen Delivery Method: Circle system utilized Preoxygenation: Pre-oxygenation with 100% oxygen Intubation Type: IV induction, Rapid sequence and Cricoid Pressure applied Laryngoscope Size: Miller and 2 Grade View: Grade I Tube type: Subglottic suction tube Tube size: 7.5 mm Number of attempts: 1 Airway Equipment and Method: Stylet Placement Confirmation: ETT inserted through vocal cords under direct vision,  positive ETCO2 and breath sounds checked- equal and bilateral Secured at: 21 cm Tube secured with: Tape Dental Injury: Teeth and Oropharynx as per pre-operative assessment

## 2013-12-12 NOTE — Procedures (Signed)
Central Venous Catheter Insertion Procedure Note [name] [id number] [birth year]  Procedure: Insertion of Central Venous Catheter Indications: Assessment of intravascular volume, Drug and/or fluid administration and Frequent blood sampling  Procedure Details Consent: Risks of procedure as well as the alternatives and risks of each were explained to the (patient/caregiver).  Consent for procedure obtained. Time Out: Verified patient identification, verified procedure, site/side was marked, verified correct patient position, special equipment/implants available, medications/allergies/relevent history reviewed, required imaging and test results available.  Performed  Maximum sterile technique was used including antiseptics, cap, gloves, gown, hand hygiene, mask and sheet. Skin prep: Chlorhexidine; local anesthetic administered A antimicrobial bonded/coated triple lumen catheter was placed in the left internal jugular vein using the Seldinger technique. Ultrasound guidance used.yes Catheter placed to 20 cm. Blood aspirated via all 3 ports and then flushed x 3. Line sutured x 2 and dressing applied.  Evaluation Blood flow good Complications: No apparent complications Patient did tolerate procedure well. Chest X-ray ordered to verify placement.  CXR: pending.  Richardson Landry Minor ACNP Maryanna Shape PCCM Pager [phone] till 3 pm If no answer page [phone] 12/10/2013, 5:11 PM   STAFF MD note  Personally supervised procedure. Real time 2D ultrasound used for vein site selection, patency assessment, and needle entry.  record of image was made but could not be submitted for filing due to malfunction of printing device    Dr. Brand Males, M.D., [facility].C.P Pulmonary and Critical Care Medicine Staff Physician [place] Pager: [phone], If no answer or between  15:00h - 7:00h: call [phone]  01/03/2014 6:03 PM

## 2013-12-12 NOTE — Progress Notes (Signed)
[facility] Progress Note Patient Name: [name] DOB: March 04, 1951 MRN: [id number]  Date of Service  12/20/2013   HPI/Events of Note   Severe abd pain.  Increasing pressor requirement.  Elevated lactate.  eICU Interventions  Dose of fentanyl ordered for pain as morphine not effective Lactate repeated Case discussed with surgery Cefepime added to present abx      Mauri Brooklyn, P 12/27/2013, 9:01 PM

## 2013-12-12 NOTE — Progress Notes (Signed)
Pt rec'd from [place] via bed.  Oriented to unit and telemetry placed.  Richardson Landry Minor, NP at bedside to place CVL.  BP upon admission to unit 62/39.  Orders rec'd to tx pt to ICU. Will cont to closely monitor until bed become available. Jacey Eckerson, [facility]

## 2013-12-12 NOTE — Progress Notes (Signed)
Order received for SLE eval and tx- (cog-ling evaluation).  MD please clarify order for swallow evaluation if indicated. Thanks.    Luanna Salk, [facility] SLP [phone]

## 2013-12-12 NOTE — Anesthesia Preprocedure Evaluation (Addendum)
Anesthesia Evaluation  Patient identified by MRN, date of birth, ID bandGeneral Assessment Comment:History noted from chart and Dr. Donne Hazel. CE  Reviewed: Allergy & Precautions, H&P , NPO status , Patient's Chart, lab work & pertinent test results  Airway Mallampati: II      Dental  (+) Poor Dentition   Pulmonary pneumonia -,  breath sounds clear to auscultation        Cardiovascular hypertension, + Peripheral Vascular Disease Rhythm:Regular Rate:Tachycardia     Neuro/Psych    GI/Hepatic PUD, GERD-  ,  Endo/Other  Hypothyroidism   Renal/GU Renal disease     Musculoskeletal   Abdominal   Peds  Hematology  (+) anemia ,   Anesthesia Other Findings   Reproductive/Obstetrics                          Anesthesia Physical Anesthesia Plan  ASA: IV and emergent  Anesthesia Plan: General   Post-op Pain Management:    Induction: Intravenous, Rapid sequence and Cricoid pressure planned  Airway Management Planned: Oral ETT  Additional Equipment:   Intra-op Plan:   Post-operative Plan: Post-operative intubation/ventilation  Informed Consent:   Plan Discussed with: CRNA, Anesthesiologist and Surgeon  Anesthesia Plan Comments:        Anesthesia Quick Evaluation

## 2013-12-13 ENCOUNTER — Inpatient Hospital Stay (HOSPITAL_COMMUNITY): Payer: Medicare Other

## 2013-12-13 LAB — CBC
HCT: 23.5 % — ABNORMAL LOW (ref 36.0–46.0)
HCT: 8.5 % — ABNORMAL LOW (ref 36.0–46.0)
HEMOGLOBIN: 2.6 g/dL — AB (ref 12.0–15.0)
HEMOGLOBIN: 8 g/dL — AB (ref 12.0–15.0)
MCH: 29.9 pg (ref 26.0–34.0)
MCH: 28.3 pg (ref 26.0–34.0)
MCHC: 34 g/dL (ref 30.0–36.0)
MCHC: 30.6 g/dL (ref 30.0–36.0)
MCV: 87.7 fL (ref 78.0–100.0)
MCV: 92.4 fL (ref 78.0–100.0)
PLATELETS: 240 10*3/uL (ref 150–400)
Platelets: 131 10*3/uL — ABNORMAL LOW (ref 150–400)
RBC: 2.68 MIL/uL — ABNORMAL LOW (ref 3.87–5.11)
RDW: 21 % — ABNORMAL HIGH (ref 11.5–15.5)
RDW: 21.7 % — ABNORMAL HIGH (ref 11.5–15.5)
WBC: 23.5 10*3/uL — ABNORMAL HIGH (ref 4.0–10.5)
WBC: 29.3 10*3/uL — AB (ref 4.0–10.5)

## 2013-12-13 LAB — POCT I-STAT 7, (LYTES, BLD GAS, ICA,H+H)
Acid-base deficit: 8 mmol/L — ABNORMAL HIGH (ref 0.0–2.0)
Bicarbonate: 17.2 mEq/L — ABNORMAL LOW (ref 20.0–24.0)
Calcium, Ion: 0.94 mmol/L — ABNORMAL LOW (ref 1.13–1.30)
HCT: 34 % — ABNORMAL LOW (ref 36.0–46.0)
Hemoglobin: 11.6 g/dL — ABNORMAL LOW (ref 12.0–15.0)
O2 Saturation: 100 %
Patient temperature: 35.3
Potassium: 3.3 mEq/L — ABNORMAL LOW (ref 3.7–5.3)
Sodium: 138 mEq/L (ref 137–147)
TCO2: 18 mmol/L (ref 0–100)
pCO2 arterial: 31.3 mmHg — ABNORMAL LOW (ref 35.0–45.0)
pH, Arterial: 7.341 — ABNORMAL LOW (ref 7.350–7.450)
pO2, Arterial: 292 mmHg — ABNORMAL HIGH (ref 80.0–100.0)

## 2013-12-13 LAB — BASIC METABOLIC PANEL
BUN: 27 mg/dL — ABNORMAL HIGH (ref 6–23)
CHLORIDE: 103 meq/L (ref 96–112)
CO2: 17 mEq/L — ABNORMAL LOW (ref 19–32)
Calcium: 6.3 mg/dL — CL (ref 8.4–10.5)
Creatinine, Ser: 2.43 mg/dL — ABNORMAL HIGH (ref 0.50–1.10)
GFR calc Af Amer: 23 mL/min — ABNORMAL LOW (ref >90)
GFR calc non Af Amer: 20 mL/min — ABNORMAL LOW (ref >90)
Glucose, Bld: 142 mg/dL — ABNORMAL HIGH (ref 70–99)
POTASSIUM: 3.2 meq/L — AB (ref 3.7–5.3)
Sodium: 141 mEq/L (ref 137–147)

## 2013-12-13 LAB — TROPONIN I: Troponin I: <0.3 ng/mL (ref <0.30)

## 2013-12-13 LAB — GLUCOSE, CAPILLARY
GLUCOSE-CAPILLARY: 83 mg/dL (ref 70–99)
Glucose-Capillary: 110 mg/dL — ABNORMAL HIGH (ref 70–99)

## 2013-12-13 LAB — COMPREHENSIVE METABOLIC PANEL
ALBUMIN: 0.7 g/dL — AB (ref 3.5–5.2)
ALK PHOS: 161 U/L — AB (ref 39–117)
ALT: 11 U/L (ref 0–35)
AST: 29 U/L (ref 0–37)
BUN: 27 mg/dL — ABNORMAL HIGH (ref 6–23)
CALCIUM: 5.3 mg/dL — AB (ref 8.4–10.5)
CO2: 12 mEq/L — ABNORMAL LOW (ref 19–32)
Chloride: 104 mEq/L (ref 96–112)
Creatinine, Ser: 2.4 mg/dL — ABNORMAL HIGH (ref 0.50–1.10)
GFR calc non Af Amer: 21 mL/min — ABNORMAL LOW (ref >90)
GFR, EST AFRICAN AMERICAN: 24 mL/min — AB (ref >90)
Glucose, Bld: 207 mg/dL — ABNORMAL HIGH (ref 70–99)
Potassium: 3.7 mEq/L (ref 3.7–5.3)
SODIUM: 143 meq/L (ref 137–147)
TOTAL PROTEIN: 1.8 g/dL — AB (ref 6.0–8.3)
Total Bilirubin: <0.2 mg/dL — ABNORMAL LOW (ref 0.3–1.2)

## 2013-12-13 LAB — LACTIC ACID, PLASMA
LACTIC ACID, VENOUS: 4.2 mmol/L — AB (ref 0.5–2.2)
Lactic Acid, Venous: 14.4 mmol/L — ABNORMAL HIGH (ref 0.5–2.2)

## 2013-12-13 LAB — BLOOD GAS, ARTERIAL
ACID-BASE DEFICIT: 6.1 mmol/L — AB (ref 0.0–2.0)
Bicarbonate: 19.2 mEq/L — ABNORMAL LOW (ref 20.0–24.0)
Drawn by: 39866
FIO2: 1 %
O2 SAT: 99.9 %
PATIENT TEMPERATURE: 97.5
PCO2 ART: 39.2 mmHg (ref 35.0–45.0)
PEEP: 5 cmH2O
RATE: 16 resp/min
TCO2: 20.5 mmol/L (ref 0–100)
VT: 470 mL
pH, Arterial: 7.307 — ABNORMAL LOW (ref 7.350–7.450)
pO2, Arterial: 323 mmHg — ABNORMAL HIGH (ref 80.0–100.0)

## 2013-12-13 LAB — CORTISOL
Cortisol, Plasma: 44.8 ug/dL
Cortisol, Plasma: 54 ug/dL

## 2013-12-13 LAB — CK TOTAL AND CKMB (NOT AT ARMC)
CK, MB: 1.4 ng/mL (ref 0.3–4.0)
RELATIVE INDEX: INVALID (ref 0.0–2.5)
Total CK: 66 U/L (ref 7–177)

## 2013-12-13 LAB — POCT I-STAT 3, ART BLOOD GAS (G3+)
Acid-base deficit: 17 mmol/L — ABNORMAL HIGH (ref 0.0–2.0)
Bicarbonate: 11.3 mEq/L — ABNORMAL LOW (ref 20.0–24.0)
O2 SAT: 100 %
PCO2 ART: 40.8 mmHg (ref 35.0–45.0)
PH ART: 7.048 — AB (ref 7.350–7.450)
PO2 ART: 237 mmHg — AB (ref 80.0–100.0)
Patient temperature: 97.9
TCO2: 13 mmol/L (ref 0–100)

## 2013-12-13 LAB — PROCALCITONIN: Procalcitonin: 31.09 ng/mL

## 2013-12-13 LAB — PREPARE RBC (CROSSMATCH)

## 2013-12-13 LAB — MAGNESIUM: Magnesium: 2.2 mg/dL (ref 1.5–2.5)

## 2013-12-13 LAB — AMYLASE: Amylase: 15 U/L (ref 0–105)

## 2013-12-13 MED ORDER — PHENYLEPHRINE HCL 10 MG/ML IJ SOLN
30.0000 ug/min | INTRAVENOUS | Status: DC
  Administered 2013-12-13: 30 ug/min via INTRAVENOUS
  Filled 2013-12-13: qty 1

## 2013-12-13 MED ORDER — SODIUM BICARBONATE 8.4 % IV SOLN
INTRAVENOUS | Status: AC
  Administered 2013-12-13: 50 meq via INTRAVENOUS
  Filled 2013-12-13: qty 100

## 2013-12-13 MED ORDER — PHENYLEPHRINE HCL 10 MG/ML IJ SOLN
30.0000 ug/min | INTRAVENOUS | Status: DC
  Administered 2013-12-13: 200 ug/min via INTRAVENOUS
  Filled 2013-12-13: qty 4

## 2013-12-13 MED ORDER — SODIUM BICARBONATE 8.4 % IV SOLN
50.0000 meq | Freq: Once | INTRAVENOUS | Status: AC
  Administered 2013-12-13: 50 meq via INTRAVENOUS

## 2013-12-13 MED ORDER — MIDAZOLAM HCL 2 MG/2ML IJ SOLN
INTRAMUSCULAR | Status: DC | PRN
  Administered 2013-12-13: 2 mg via INTRAVENOUS

## 2013-12-13 MED ORDER — SODIUM CHLORIDE 0.9 % IV SOLN
1.0000 mg/h | INTRAVENOUS | Status: DC
  Filled 2013-12-13: qty 10

## 2013-12-13 MED ORDER — SODIUM CHLORIDE 0.9 % IV BOLUS (SEPSIS)
1000.0000 mL | Freq: Once | INTRAVENOUS | Status: AC
  Administered 2013-12-13: 1000 mL via INTRAVENOUS

## 2013-12-13 MED ORDER — SODIUM BICARBONATE 8.4 % IV SOLN
INTRAVENOUS | Status: DC
  Administered 2013-12-13: 05:00:00 via INTRAVENOUS
  Filled 2013-12-13 (×3): qty 150

## 2013-12-13 MED ORDER — SODIUM CHLORIDE 0.9 % IV SOLN
2000.0000 mg | Freq: Once | INTRAVENOUS | Status: AC
Start: 2013-12-13 — End: 2013-12-13
  Administered 2013-12-13: 2000 mg via INTRAVENOUS
  Filled 2013-12-13: qty 2000

## 2013-12-13 MED ORDER — CHLORHEXIDINE GLUCONATE 0.12 % MT SOLN
15.0000 mL | Freq: Two times a day (BID) | OROMUCOSAL | Status: DC
  Administered 2013-12-13: 15 mL via OROMUCOSAL
  Filled 2013-12-13: qty 15

## 2013-12-13 MED ORDER — POTASSIUM CHLORIDE 10 MEQ/50ML IV SOLN
INTRAVENOUS | Status: AC
  Administered 2013-12-13: 10 meq via INTRAVENOUS
  Filled 2013-12-13: qty 50

## 2013-12-13 MED ORDER — VASOPRESSIN 20 UNIT/ML IJ SOLN
0.0300 [IU]/min | INTRAVENOUS | Status: DC
  Administered 2013-12-13: 0.03 [IU]/min via INTRAVENOUS
  Filled 2013-12-13: qty 2.5

## 2013-12-13 MED ORDER — ACETAMINOPHEN 325 MG PO TABS: 650.0000 mg | ORAL_TABLET | ORAL | Status: DC | PRN

## 2013-12-13 MED ORDER — SODIUM CHLORIDE 0.9 % IV SOLN
100.0000 mg | INTRAVENOUS | Status: DC
  Administered 2013-12-13: 100 mg via INTRAVENOUS
  Filled 2013-12-13: qty 100

## 2013-12-13 MED ORDER — SODIUM CHLORIDE 0.9 % IV SOLN
25.0000 ug/h | INTRAVENOUS | Status: DC
  Filled 2013-12-13: qty 50

## 2013-12-13 MED ORDER — BIOTENE DRY MOUTH MT LIQD
1.0000 "application " | Freq: Four times a day (QID) | OROMUCOSAL | Status: DC
  Administered 2013-12-13: 15 mL via OROMUCOSAL

## 2013-12-13 MED ORDER — POTASSIUM CHLORIDE 10 MEQ/50ML IV SOLN
10.0000 meq | INTRAVENOUS | Status: DC
  Administered 2013-12-13 (×2): 10 meq via INTRAVENOUS
  Filled 2013-12-13: qty 50

## 2013-12-13 MED ORDER — SODIUM CHLORIDE 0.9 % IV SOLN
1.0000 g | Freq: Two times a day (BID) | INTRAVENOUS | Status: DC
  Administered 2013-12-13: 1 g via INTRAVENOUS
  Filled 2013-12-13 (×3): qty 1

## 2013-12-13 MED ORDER — HYDROCORTISONE SOD SUCCINATE 100 MG IJ SOLR
50.0000 mg | Freq: Four times a day (QID) | INTRAMUSCULAR | Status: DC
  Administered 2013-12-13: 50 mg via INTRAVENOUS
  Filled 2013-12-13 (×6): qty 1

## 2013-12-13 MED ORDER — MIDAZOLAM HCL 2 MG/2ML IJ SOLN
INTRAMUSCULAR | Status: AC
  Filled 2013-12-13: qty 2

## 2013-12-13 MED ORDER — DIPHENHYDRAMINE HCL 50 MG/ML IJ SOLN: 25.0000 mg | Freq: Four times a day (QID) | INTRAMUSCULAR | Status: DC | PRN

## 2013-12-13 MED ORDER — EPINEPHRINE HCL 1 MG/ML IJ SOLN
0.5000 ug/min | INTRAVENOUS | Status: DC
  Administered 2013-12-13: 0.5 ug/min via INTRAVENOUS
  Filled 2013-12-13: qty 1

## 2013-12-13 MED ORDER — NOREPINEPHRINE BITARTRATE 1 MG/ML IJ SOLN
2.0000 ug/min | INTRAVENOUS | Status: DC
  Administered 2013-12-13: 50 ug/min via INTRAVENOUS
  Filled 2013-12-13 (×2): qty 16

## 2013-12-14 ENCOUNTER — Encounter (HOSPITAL_COMMUNITY): Payer: Self-pay | Admitting: General Surgery

## 2013-12-14 LAB — POCT I-STAT 3, ART BLOOD GAS (G3+)
ACID-BASE DEFICIT: 13 mmol/L — AB (ref 0.0–2.0)
Bicarbonate: 13.1 mEq/L — ABNORMAL LOW (ref 20.0–24.0)
O2 Saturation: 99 %
PH ART: 7.232 — AB (ref 7.350–7.450)
PO2 ART: 158 mmHg — AB (ref 80.0–100.0)
TCO2: 14 mmol/L (ref 0–100)
pCO2 arterial: 30.9 mmHg — ABNORMAL LOW (ref 35.0–45.0)

## 2013-12-14 LAB — HIV-1 RNA QUANT-NO REFLEX-BLD: HIV-1 RNA Quant, Log: <1.3 {Log} (ref <1.30)

## 2013-12-15 LAB — TYPE AND SCREEN
ABO/RH(D): B POS
Antibody Screen: NEGATIVE
UNIT DIVISION: 0
Unit division: 0

## 2013-12-17 LAB — PATHOLOGIST SMEAR REVIEW

## 2013-12-19 LAB — CULTURE, BLOOD (ROUTINE X 2)
CULTURE: NO GROWTH
Culture: NO GROWTH

## 2014-01-08 NOTE — Progress Notes (Signed)
Called to see patient for refractory shock despite max doses of 3 pressors. BPs On exam pupils fixed and dilated. Abd firm but per bedside RN unchanged from time of discharge from OR. Wound vac intact with ~ 500 ml serosanguinous drainage.   Repeat ABG notable for pH of 7.048. Improvement in BP with 2 amps NaBicarb. Will start drip and increase vent rate to 30.   Review of recent BMP notable for hypokalemia, worsening renal function. K repletion provided.   Will add epi drip.  Will repeat ABG q 1 hr. May consider CVVHD if refractory acidosis.   Will send repeat CMP, CBC, Troponin, Lactate.  Discussed grim situation with family at bedside (sister). Given that she is on maximal medical therapy at this point ACLS would be futile. Her sister understood.   CRITICAL CARE: The patient is critically ill with multiple organ systems failure and requires high complexity decision making for assessment and support, frequent evaluation and titration of therapies, application of advanced monitoring technologies and extensive interpretation of multiple databases. Critical Care Time devoted to patient care services described in this note is 60 minutes.

## 2014-01-08 NOTE — Anesthesia Postprocedure Evaluation (Signed)
  Anesthesia Post-op Note  Patient: [name]  Procedure(s) Performed: Procedure(s): EXPLORATORY LAPAROTOMY WITH SUBTOTAL COLECTOMY (N/A)  Patient Location: PACU and ICU  Anesthesia Type:General  Level of Consciousness: sedated  Airway and Oxygen Therapy: Patient remains intubated per anesthesia plan  Post-op Pain: mild  Post-op Assessment: Post-op Vital signs reviewed  Post-op Vital Signs: Reviewed  Complications: No apparent anesthesia complications

## 2014-01-08 NOTE — Progress Notes (Signed)
ANTIBIOTIC CONSULT NOTE - INITIAL  Pharmacy Consult for Vancomycin and Meropenem Indication: rule out sepsis  Allergies  Allergen Reactions  . Aspirin Rash    Patient Measurements: Height: 5\' 6"  (167.6 cm) Weight: 254 lb 13.6 oz (115.6 kg) IBW/kg (Calculated) : 59.3 Adjusted Body Weight: 85 kg  Vital Signs: Temp: 97.5 F (36.4 C) (04/05 0036) Temp src: Oral (04/05 0036) BP: 62/36 mmHg (04/05 0100) Pulse Rate: 127 (04/05 0130) Intake/Output from previous day: 04/04 0701 - 04/05 0700 In: 3939 [I.V.:1949; NG/GT:90; IV Piggyback:1900] Out: 110 [Urine:110] Intake/Output from this shift: Total I/O In: 1896.9 [I.V.:966.9; NG/GT:30; IV Piggyback:900] Out: 10 [Urine:10]  Labs:  Recent Labs  12/11/13 0613 01/06/2014 0900 12/23/2013 2328 12/30/2013 0040  WBC 11.9* 20.9*  --  29.3*  HGB 9.7* 10.9* 11.6* 8.0*  PLT 430* 339  --  240  CREATININE 1.04 2.24*  --  2.43*   Estimated Creatinine Clearance: 31 ml/min (by C-G formula based on Cr of 2.43). No results found for this basename: VANCOTROUGH, Corlis Leak, VANCORANDOM, [place], GENTPEAK, GENTRANDOM, [place], TOBRAPEAK, TOBRARND, AMIKACINPEAK, AMIKACINTROU, AMIKACIN,  in the last 72 hours   Microbiology: Recent Results (from the past 720 hour(s))  CLOSTRIDIUM DIFFICILE BY PCR     Status: Abnormal   Collection Time    11/14/13 12:44 PM      Result Value Ref Range Status   C difficile by pcr POSITIVE (*) NEGATIVE Final   Comment: CRITICAL RESULT CALLED TO, READ BACK BY AND VERIFIED WITH:     Bonne Dolores RN AT 9562 11/14/13 BY [place] DIFFICILE BY PCR     Status: None   Collection Time    11/27/13 10:46 AM      Result Value Ref Range Status   C difficile by pcr NEGATIVE  NEGATIVE Final  CLOSTRIDIUM DIFFICILE BY PCR     Status: None   Collection Time    12/05/13  8:11 PM      Result Value Ref Range Status   C difficile by pcr NEGATIVE  NEGATIVE Final  MRSA PCR SCREENING     Status: None   Collection  Time    12/09/13  1:13 PM      Result Value Ref Range Status   MRSA by PCR NEGATIVE  NEGATIVE Final   Comment:            The GeneXpert MRSA Assay (FDA     approved for NASAL specimens     only), is one component of a     comprehensive MRSA colonization     surveillance program. It is not     intended to diagnose MRSA     infection nor to guide or     monitor treatment for     MRSA infections.  CLOSTRIDIUM DIFFICILE BY PCR     Status: Abnormal   Collection Time    12/10/13  5:34 PM      Result Value Ref Range Status   C difficile by pcr POSITIVE (*) NEGATIVE Final   Comment: CRITICAL RESULT CALLED TO, READ BACK BY AND VERIFIED WITH:     T.HUBBARD,RN 12/11/13 0918 BY [facility]  SURGICAL PCR SCREEN     Status: None   Collection Time    12/31/2013 10:31 PM      Result Value Ref Range Status   MRSA, PCR NEGATIVE  NEGATIVE Final   Staphylococcus aureus NEGATIVE  NEGATIVE Final   Comment:            The  Xpert SA Assay (FDA     approved for NASAL specimens     in patients over 12 years of age),     is one component of     a comprehensive surveillance     program.  Test performance has     been validated by [employer] for patients greater     than or equal to 90 year old.     It is not intended     to diagnose infection nor to     guide or monitor treatment.   Medications:  Scheduled:  . antiseptic oral rinse  1 application Mouth Rinse QID  . chlorhexidine  15 mL Mouth/Throat BID  . enoxaparin (LOVENOX) injection  110 mg Subcutaneous BID  . famotidine (PEPCID) IV  20 mg Intravenous Q12H  . fidaxomicin  200 mg Oral BID  . hydrocortisone sodium succinate  50 mg Intravenous 4 times per day  . metronidazole  500 mg Intravenous Q6H  . micafungin (MYCAMINE) IV  100 mg Intravenous Q24H  . ondansetron (ZOFRAN) IV  4 mg Intravenous 4 times per day  . sodium chloride  1,000 mL Intravenous Once  . sodium chloride  1,000 mL Intravenous Once  . sodium chloride  500 mL Intravenous Once    Assessment: 62 yo female with septic shock for empiric antibiotics  Goal of Therapy:  Vancomycin trough level 15-20 mcg/ml  Plan:  Vancomycin 2 g IV now Meropenem 1 g IV q12h F/U renal function.  [name], Bronson Curb 12/16/2013,1:47 AM

## 2014-01-08 NOTE — Progress Notes (Signed)
CRITICAL VALUE ALERT  Critical value received:  Hgb 2.6  Date of notification:  Jan 06, 2014  Time of notification:  0525  Critical value read back: YES  Nurse who received alert:  Wyn Quaker RN   MD notified (1st page):  eLink MD Zubelevitskiy   I: Informed Bedside RN Rosalva Ferron MD Z. Pt now DNR and actively dying. eLink MD calling bloodbank for emergency released blood.    Wyn Quaker RN

## 2014-01-08 NOTE — Transfer of Care (Signed)
Immediate Anesthesia Transfer of Care Note  Patient: [name]  Procedure(s) Performed: Procedure(s): EXPLORATORY LAPAROTOMY WITH SUBTOTAL COLECTOMY (N/A)  Patient Location: ICU  Anesthesia Type:General  Level of Consciousness: Patient remains intubated per anesthesia plan  Airway & Oxygen Therapy: Patient remains intubated per anesthesia plan and Patient placed on Ventilator (see vital sign flow sheet for setting)  Post-op Assessment: Report given to PACU RN and Post -op Vital signs reviewed and stable  Post vital signs: Reviewed and stable  Complications: No apparent anesthesia complications

## 2014-01-08 NOTE — Progress Notes (Signed)
[facility] Progress Note Patient Name: [name] DOB: 1951-07-17 MRN: [id number]  Date of Service  12/12/2013   HPI/Events of Note     eICU Interventions   Confirmed with bedside MD - DNR   Intervention Category Major Interventions: Shock - evaluation and management  Saunders [place] 12/19/2013, 5:19 AM

## 2014-01-08 NOTE — Progress Notes (Signed)
[facility] Progress Note Patient Name: [name] DOB: 23-Apr-1951 MRN: [id number]  Date of Service  12/28/2013   HPI/Events of Note   Shock SBP ~ 60 Levophed @ 50   eICU Interventions   Vasopressin added Stress dose steroids added Abx reviewed On Flagyl / Dificid for c.dif Cefepime d/c'd - no anaerobic coverage Meropenem added Vancomycin added Micafungin added    Intervention Category Major Interventions: Shock - evaluation and management  [name] 12/09/2013, 1:36 AM

## 2014-01-08 NOTE — Progress Notes (Signed)
Arti Trang was pronounced with a time of death of 6. Pupils fixed, dilated, and non-reactive. Cardiac monitor shows asystole, no heart sounds and with no spontaneous respirations noted. Family at bedside. DR Zubelevitskiy notified of time of death. CDS called and not a suitable donor.

## 2014-01-08 NOTE — Op Note (Signed)
Preoperative diagnosis: fulminant c diff Postoperative diagnosis: same as above, patchy necrosis of distal jejunum and ileum Procedure:  1. Subtotal colectomy 2. Placement of abdominal wound vac Surgeon: Dr Serita Grammes Asst: Dr Kaylyn Lim EBL: 50 cc Drains abd wound vac Complications none Specimen terminal ileum and colon Sponge and needle count correct Disposition to recovery stable  This is a 63 year old female who has had C. Difficile for some time. Her albumin is low. She is fairly deconditioned also. During the past couple of days she has progressively worsened in terms of her abdominal pain. This has progressed to the point that she has peritonitis. Her lactate is above 5. Her white blood cell count is over 20,000. Her creatinine has tripled over the last couple of days as well. Due to all of this I discussed with her family going to the operating room for a subtotal colectomy. I discussed this with him and we decided to proceed.  Procedure: After informed consent was obtained the patient was taken to the operating room. She was on antibiotics already. Sequential compression devices were on her legs. She was then placed under general anesthesia without complication. Her abdomen was prepped and draped in the standard sterile surgical fashion. A surgical timeout was then performed.  I made a midline incision. I carried this thruogh the peritoneum. There was a large volume of ascites that came out. I immediately evaluated her abdomen. The distal jejunum and ileum had patchy necrosis throughout. None of this appeared to need to be resected. This remained that way throughout the entire case. The colon was very boggy and enlarged. Theright colon and transverse colon was paper thin with signs of ischemia in it. There was no hole. I then proceeded to mobilize the right colon by taking down the white line. I did this around the hepatic flexure and then mobilized the transverse colon and divided  the omentum. I then released this from the stomach. I used a LigaSure device as well as sutures to divide the mesentery. The splenic flexure was very high and difficult due to the fact that the colon was so enlarged and thin. I eventually was able to take down the splenic flexure the combination of cautery and the LigaSure device. The spleen remained intact. I then took down the white line on the left side and took this all the way down to the rectum. This mesentery was also divided with the LigaSure device. I then used a GIA stapler to come across the terminal ileum. I also used a  GIA stapler to come across the top of the rectum also. The specimen was then passed off the table. I then did copious irrigation. Hemostasis was obtained. I ran the small bowel again. This still had patchy necrosis of the small bowel. Due to this ischemia and as well as her ongoing need for pressors I elected to place an abdominal vac with the plan for repeat operation in 48 hours. I am concerned that this patchy necrosis may progress. The VAC was placed without difficulty. She was then transferred back to the ICU in critical condition.

## 2014-01-08 NOTE — Procedures (Signed)
Extubation Procedure Note  Patient Details:   Name: [name] DOB: [birth year] MRN: [id number]   Airway Documentation:     Evaluation  O2 sats: Pt expired Complications: No apparent complications Patient did tolerate procedure well. Bilateral Breath Sounds: Diminished Suctioning: Airway No  Pt extubated after expiring.   Dulcy Fanny 01/01/2014, 6:31 AM

## 2014-01-08 NOTE — Progress Notes (Signed)
12/31/2013 0600  Called Medical Examiner Jeanette Caprice [phone]). Pt will not be ME case.   Wyn Quaker RN

## 2014-01-08 NOTE — Progress Notes (Signed)
Chaplain gave support to pt and pt's 2 sons and sister through compassionate presence. Sister of pt asked for prayer.  Chaplain prayed with family of pt.      12/24/2013 0400  Clinical Encounter Type  Visited With Patient and family together  Visit Type Spiritual support;Critical Care  Referral From Nurse  Spiritual Encounters  Spiritual Needs Emotional;Prayer    Estelle June, chaplain [phone]

## 2014-01-08 NOTE — Progress Notes (Signed)
[facility] Progress Note Patient Name: [name] DOB: [birth year] MRN: [id number]  Date of Service  01/03/2014   HPI/Events of Note   Back from OR Ventilated Hypotensive, on Levophed, CVP 2    eICU Interventions   Vent orders Fentanyl / Versed ABG PCXR VAP Px CBC, BMP, lactate, troponin, cortisol     Intervention Category Major Interventions: Other:  Doree Fudge , 12:57 AM

## 2014-01-08 DEATH — deceased

## 2014-01-15 NOTE — Discharge Summary (Signed)
Death Summary  [name] UXN:[id number] DOB: 12-Apr-1951 DOA: 14-Nov-2013  PCP: [name], MD   Admit date: 11/14/13 Date of Death: 12-19-13  Final Diagnoses:    Severe septic Shock.   Severe Clostridium difficile enterocolitis   Acute respiratory failure intubated twice during this hospitalization.    Hematemesis   Aspiration pneumonia   Dysphagia, pharyngoesophageal phase   H/O psychosis   Pulmonary hypertension   Duodenal ulcer, acute   Reflux esophagitis   Other and unspecified noninfectious gastroenteritis and colitis(558.9)   Ulceration of intestine   Benign neoplasm of colon   DVT (deep venous thrombosis)   Nausea alone   Shock circulatory     History of present illness:  63 year old obese female with history of HTN, GERD, depression, obesity and lumbar spondylosis status post lumbar decompression surgery in January 2015. Just discharged 11/04/13 after treatment for c.diff colitis and salmonella enterocolitis. Hospitalization complicated by encephalopathy and acute renal failure (peak creatinine 3.03). Admitted 2/25 with respiratory failure though due to combination PNA and HF.   Hospital Course:  63 year old obese female with history of HTN, GERD, depression, obesity and lumbar spondylosis status post lumbar decompression surgery in January 2015. Just discharged 11/04/13 after treatment for c.diff colitis and salmonella enterocolitis. Hospitalization complicated by encephalopathy and acute renal failure (peak creatinine 3.03). Admitted 2/25 with respiratory failure though due to combination PNA and HF. She required intubation from 3-2 until 3-5. Patient had recurrent of diarrhea. C diff on 3-7 was positive. She was started on on IV flagyl and oral vancomycin until 3-21. Patient develop diarrhea 3-26. C diff on 3-26 was order which was negative. Patient was having difficulty with dysphagia during hospitalization. J tube placement was considered. On 4-2 patient WBC  increase, she had increase diarrhea. C diff was ordered and it was positive. Patient was started on IV flagyl and oral Vancomycin. GI was consulted for recommendation regarding J tube placement. J tube placement was deferred. ID was consulted to help with C diff infection recurrence. On 4-4 patient developed hypotension which initially respond to IV fluids. Hypotension reoccurs. Lactic acid was elevated. Patient was transfer to ICU. Patient required again mechanical intubation. Surgery was consulted. Patient with severe C diff infection. Septic shock. Decision was made to due total colectomy. Patient post surgery on maximal medical therapy, multiple pressors. Patient with refractory shock. CCM discussed with family that ACLS would be futile. Patient made DNR. Patient Hb at 2.5. Blood transfusion was ordered. Patient subsequently died at 59;38 on 4-5.    Time: of death 5:38.  Signed:  [name]  [facility] 01/15/2014, 12:12 PM

## 2014-07-12 ENCOUNTER — Encounter (HOSPITAL_COMMUNITY): Payer: Self-pay | Admitting: General Surgery

## 2015-10-27 IMAGING — CT CT HEAD W/O CM
1 series · 16 of 30 positions shown, 20 images · non-contrast
Comparison: Prior CT from 09/21/2013

CLINICAL DATA: Altered mental status

EXAM:
CT HEAD WITHOUT CONTRAST
TECHNIQUE: Contiguous axial images were obtained from the base of the skull
through the vertex without intravenous contrast.

[Series 2: headseq 4.8 h45s · axial · 0.43mm/px · z∈[-150,-22]mm · 16 of 30 slices shown, 20 images]
[im 2/30  brain]
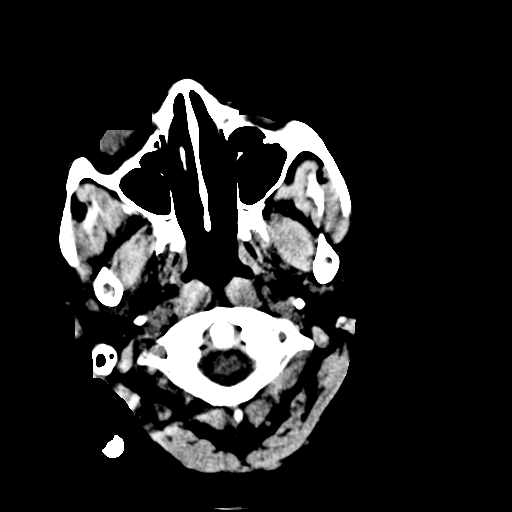
[im 2/30  bone]
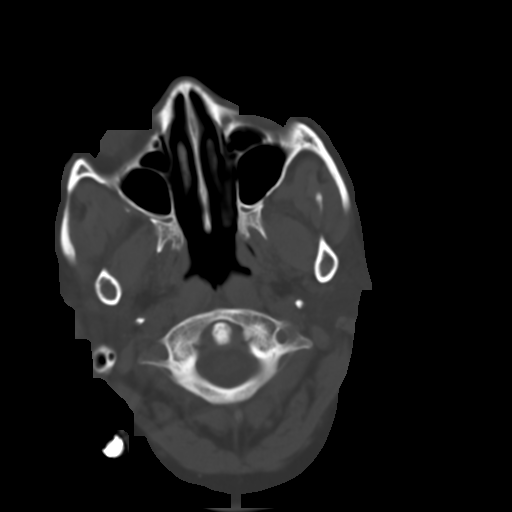
[im 4/30  brain]
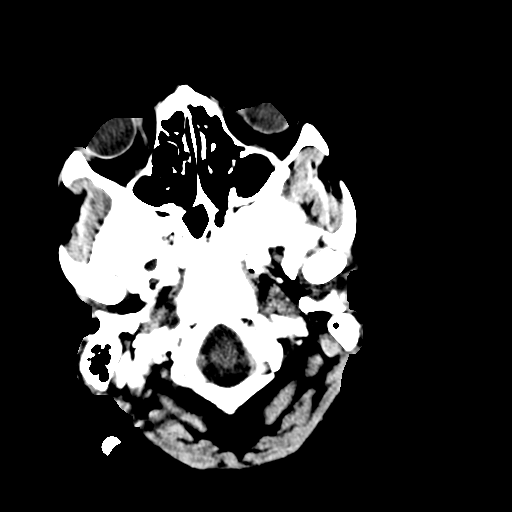
[im 6/30  brain]
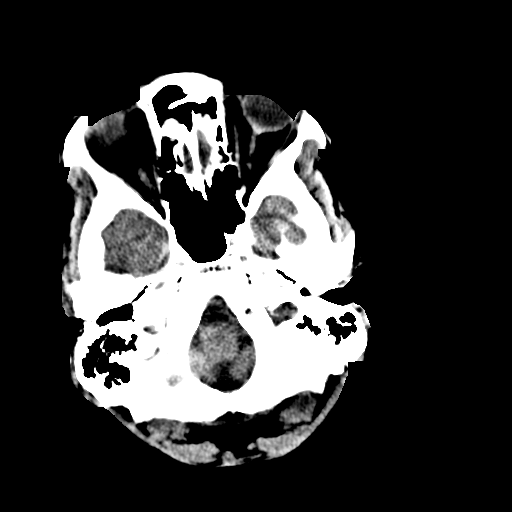
[im 8/30  brain]
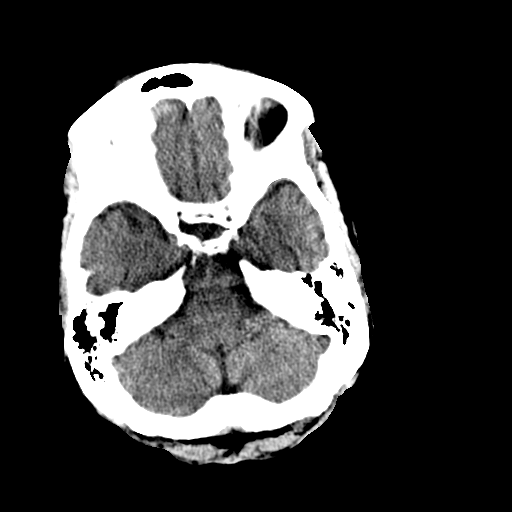
[im 9/30  brain]
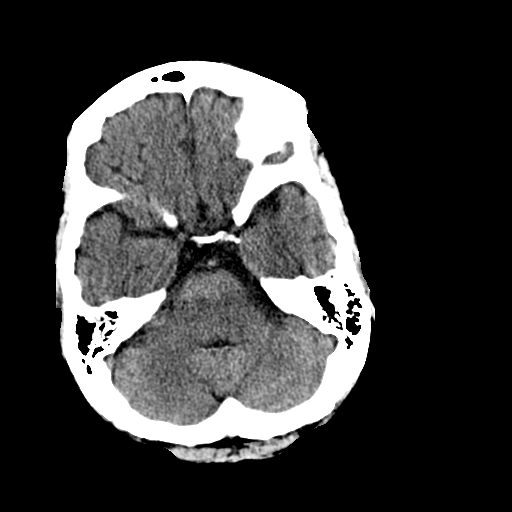
[im 9/30  bone]
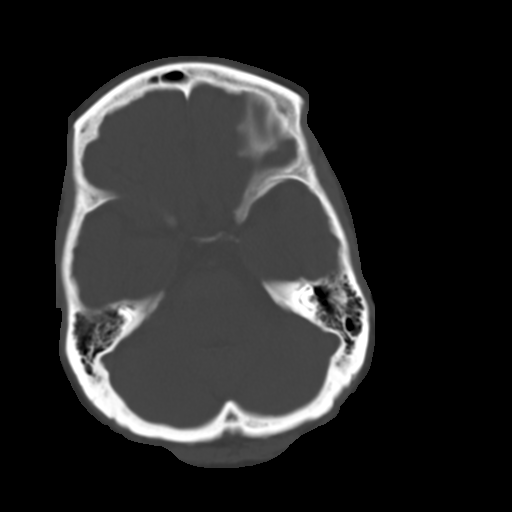
[im 11/30  brain]
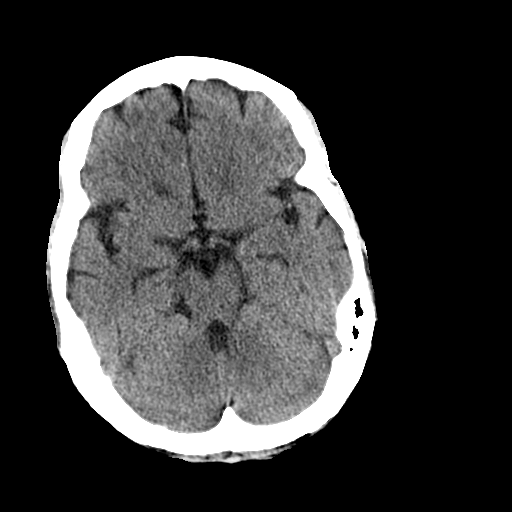
[im 13/30  brain]
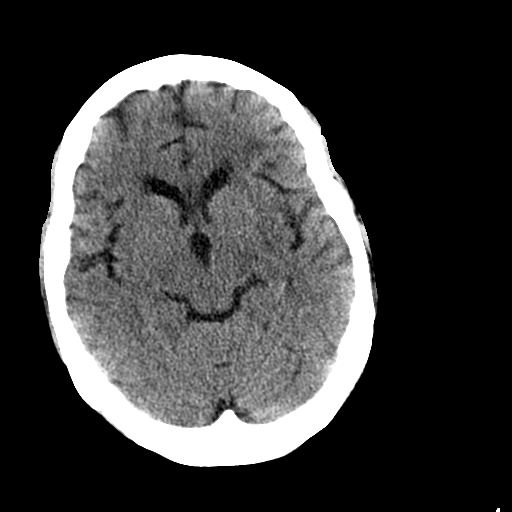
[im 15/30  brain]
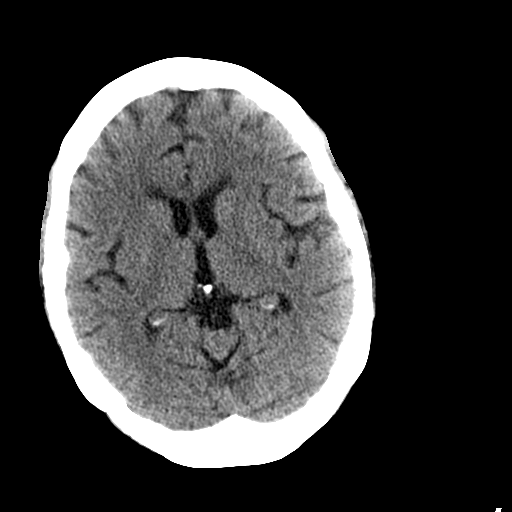
[im 16/30  brain]
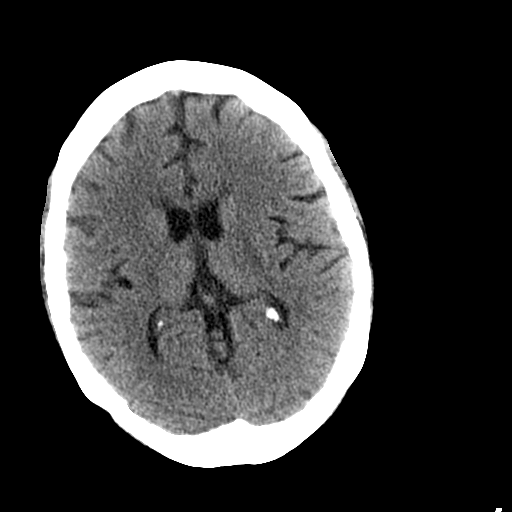
[im 16/30  bone]
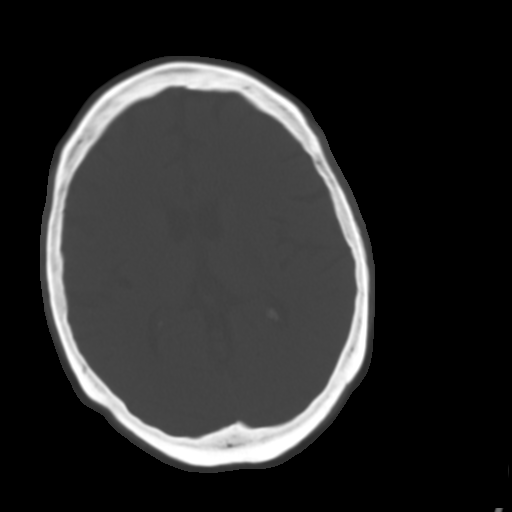
[im 18/30  brain]
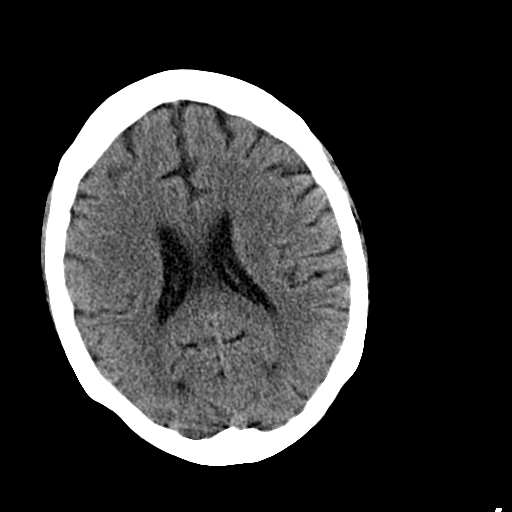
[im 20/30  brain]
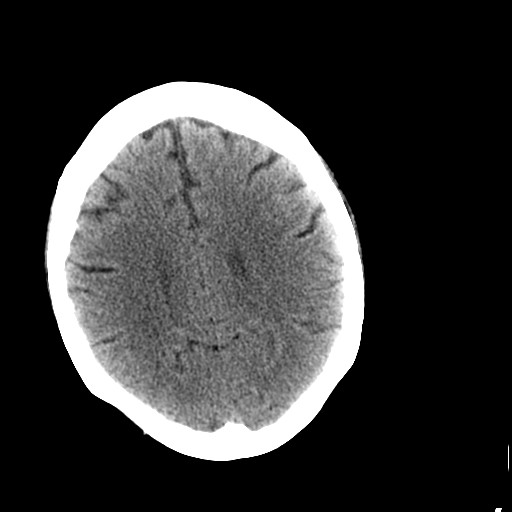
[im 22/30  brain]
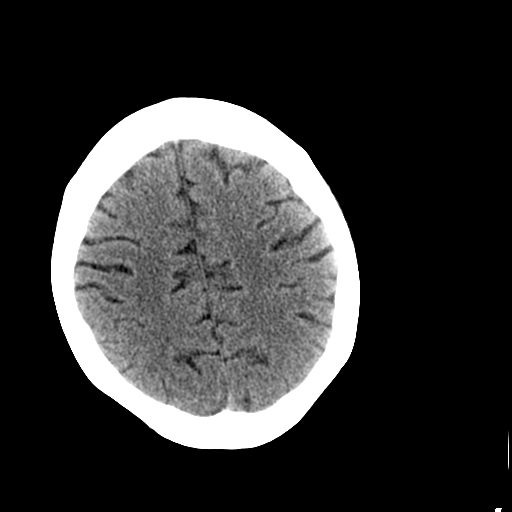
[im 23/30  brain]
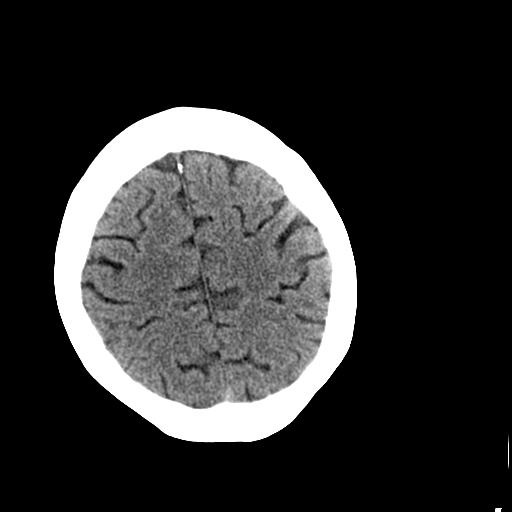
[im 23/30  bone]
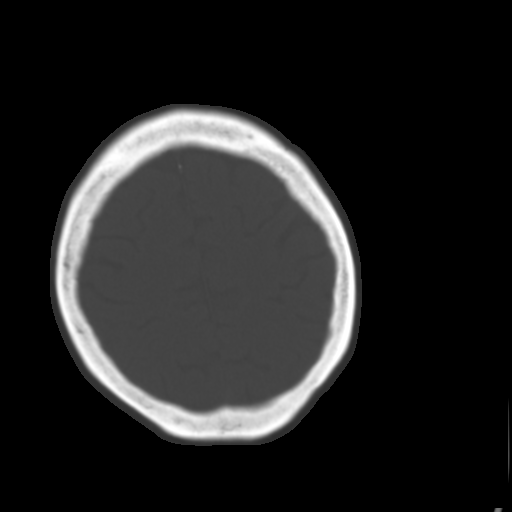
[im 25/30  brain]
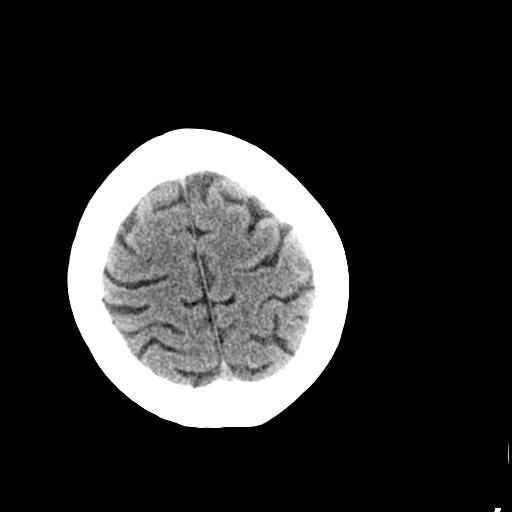
[im 27/30  brain]
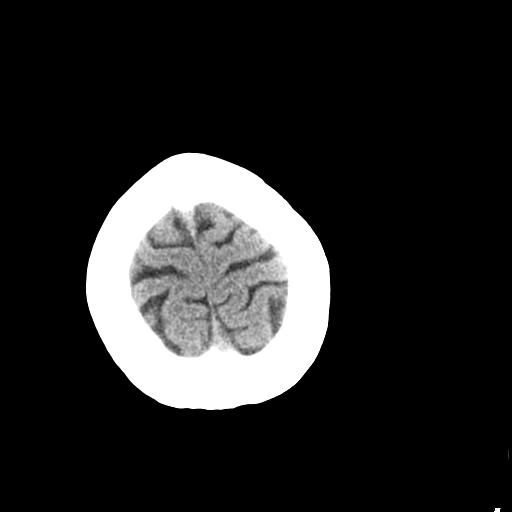
[im 29/30  brain]
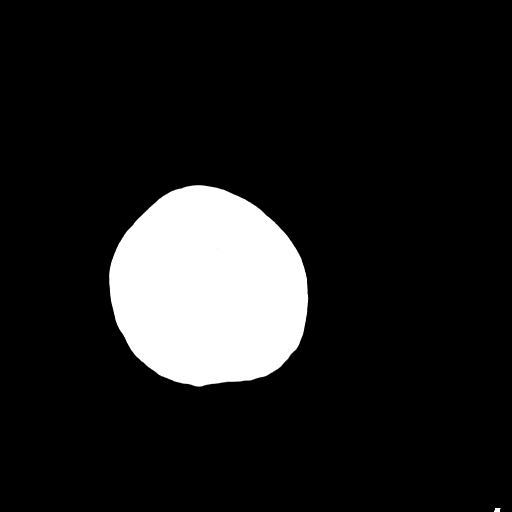

[16 of 30 positions shown; findings below may reference images not displayed]

FINDINGS: Mild chronic microvascular ischemic disease is unchanged. There is
no acute intracranial hemorrhage or infarct. No mass lesion or
midline shift. Gray-white matter differentiation is well maintained.
Ventricles are normal in size without evidence of hydrocephalus. CSF
containing spaces are within normal limits. No extra-axial fluid
collection.

The calvarium is intact.

Orbital soft tissues are within normal limits.

The paranasal sinuses and mastoid air cells are well pneumatized and
free of fluid.

Scalp soft tissues are unremarkable.
IMPRESSION: 1. No acute intracranial abnormality.
2. Mild chronic microvascular ischemic changes, stable.

## 2015-10-27 IMAGING — CT CT ABD-PELV W/O CM
2 of 4 series · 17 of 46 positions shown, 19 images · non-contrast
Comparison: None.

CLINICAL DATA: Dark stool

EXAM:
CT ABDOMEN AND PELVIS WITHOUT CONTRAST
TECHNIQUE: Multidetector CT imaging of the abdomen and pelvis was performed
following the standard protocol without IV contrast.

[Series 2: rtn a/p w/o · axial · non-contrast · 0.74mm/px · z∈[-309,+101]mm · 14 of 90 slices shown, 16 images]
[im 4/90  soft-tissue]
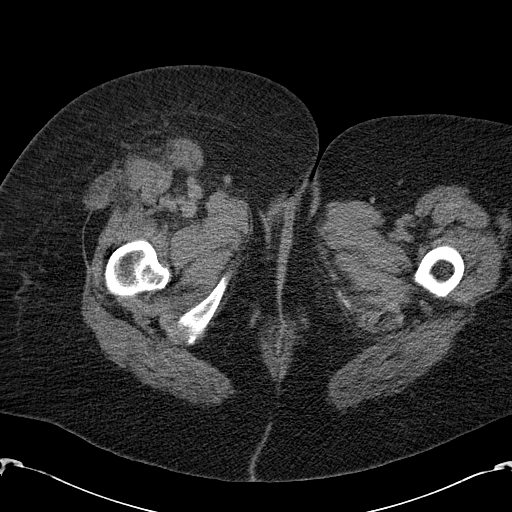
[im 4/90  bone]
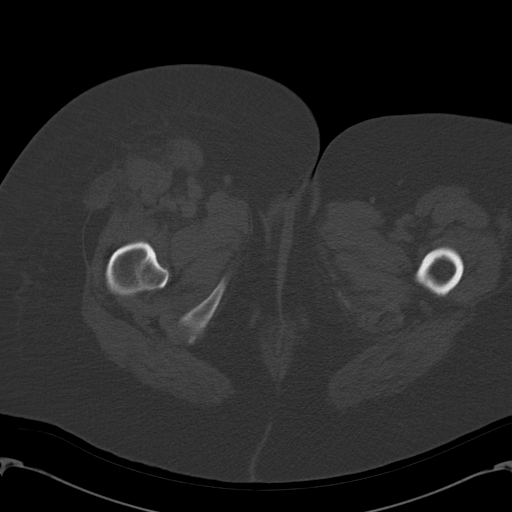
[im 11/90  soft-tissue]
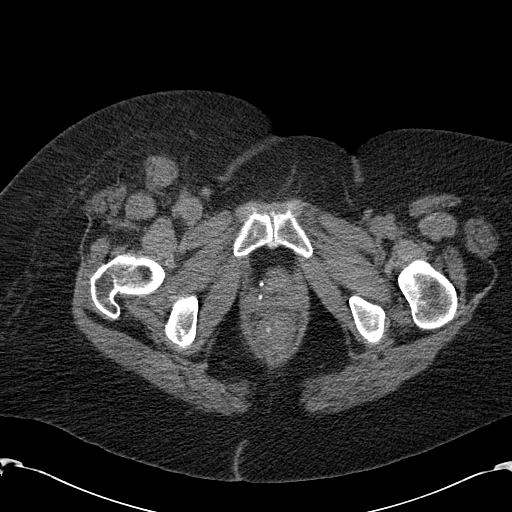
[im 18/90  soft-tissue]
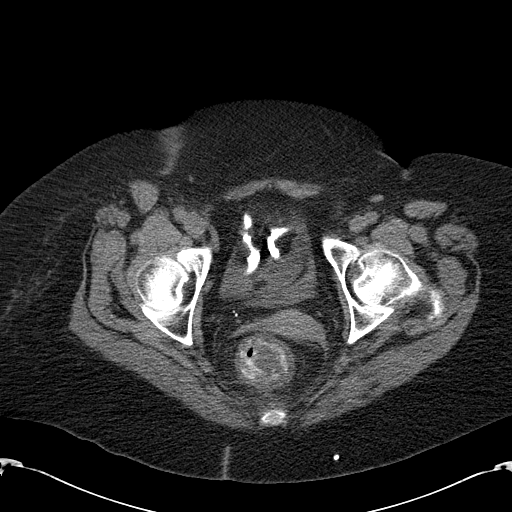
[im 25/90  soft-tissue]
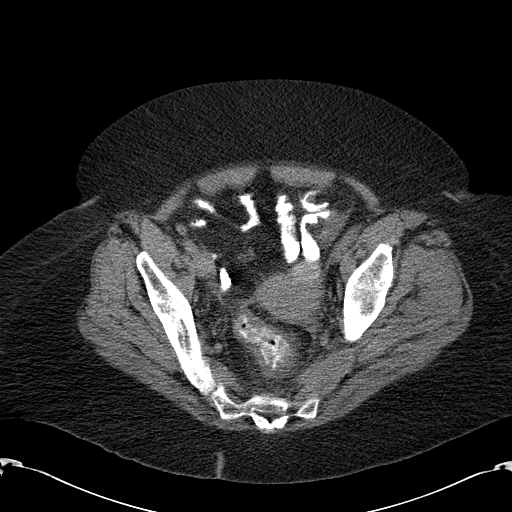
[im 29/90  soft-tissue]
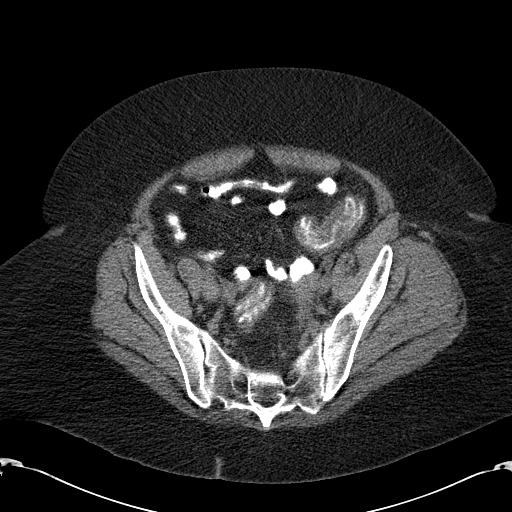
[im 36/90  soft-tissue]
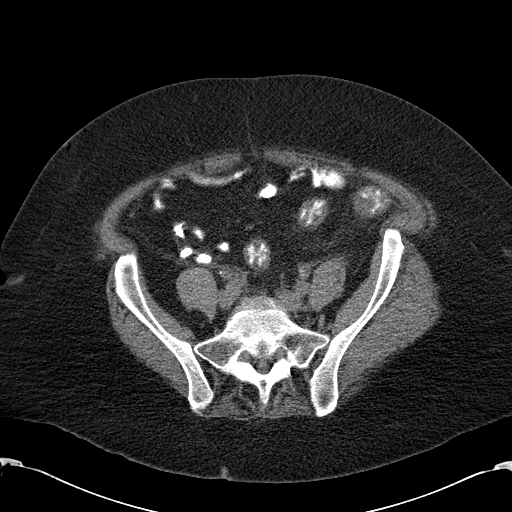
[im 43/90  soft-tissue]
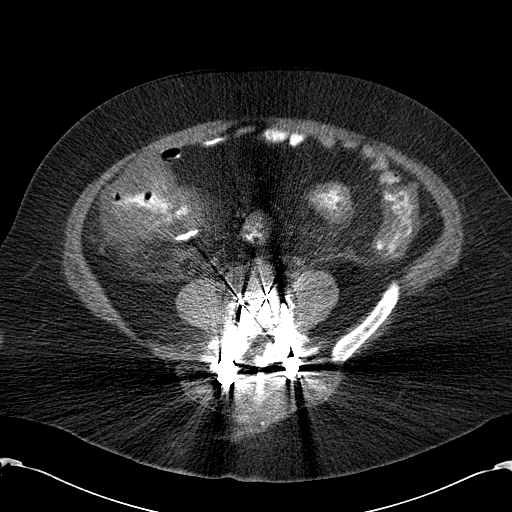
[im 47/90  soft-tissue]
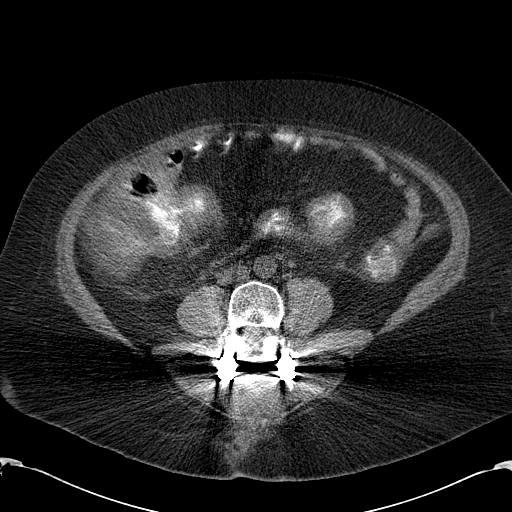
[im 54/90  soft-tissue]
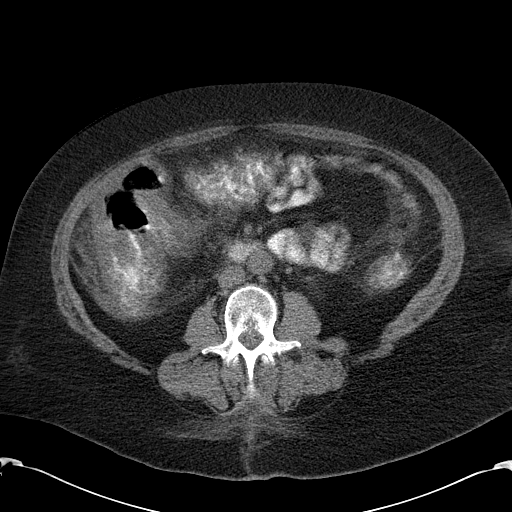
[im 54/90  bone]
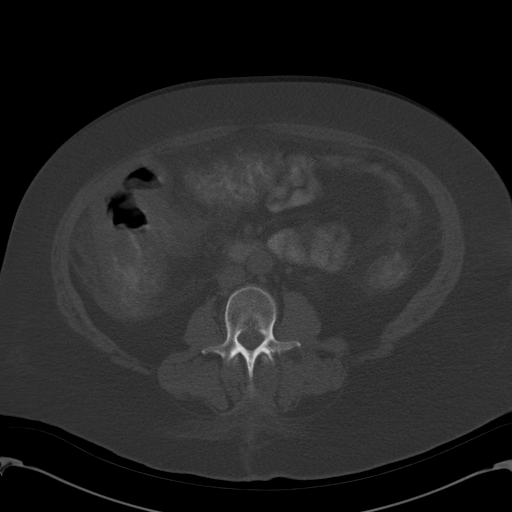
[im 61/90  soft-tissue]
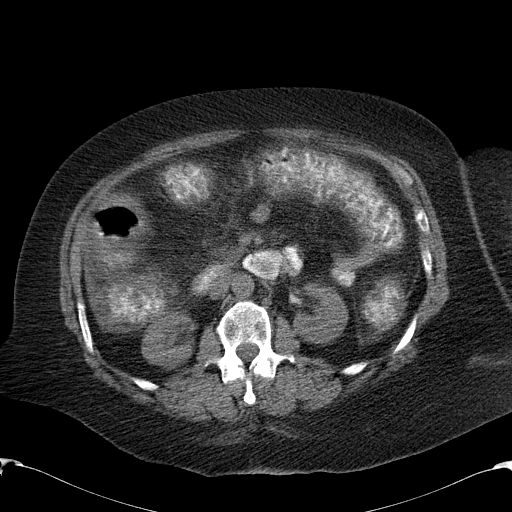
[im 68/90  soft-tissue]
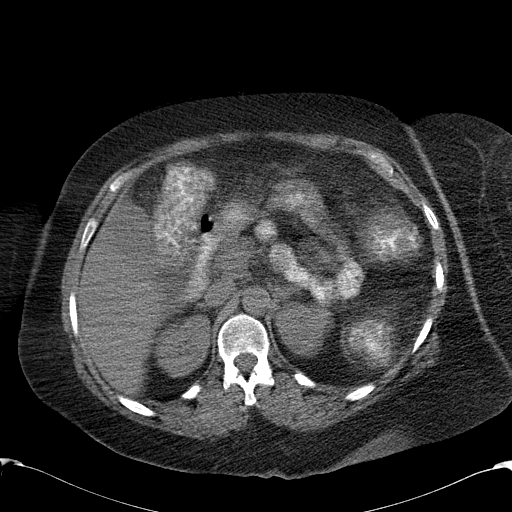
[im 72/90  soft-tissue]
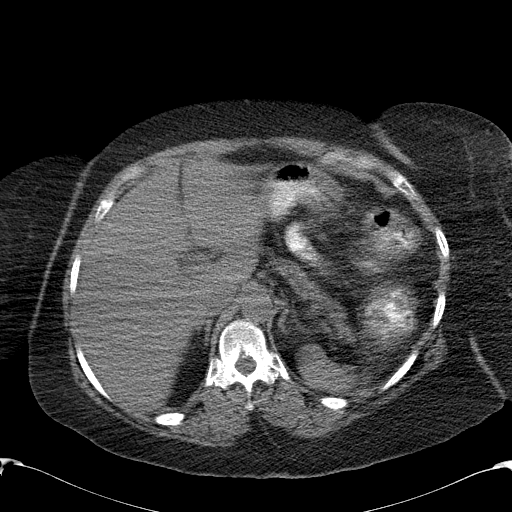
[im 79/90  soft-tissue]
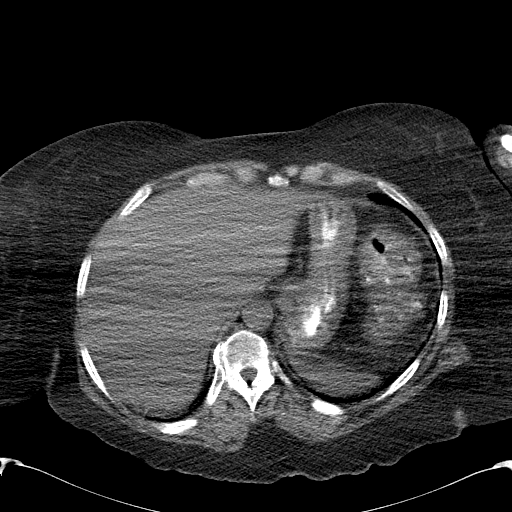
[im 86/90  soft-tissue]
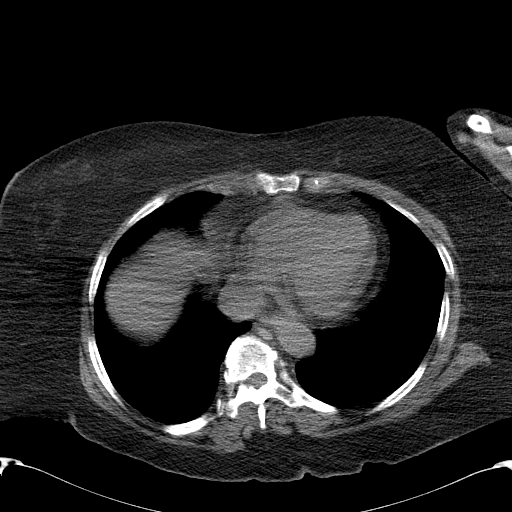

[Series 602: cor · coronal · 0.88mm/px · 3 of 95 slices shown]
[im 32/95  soft-tissue]
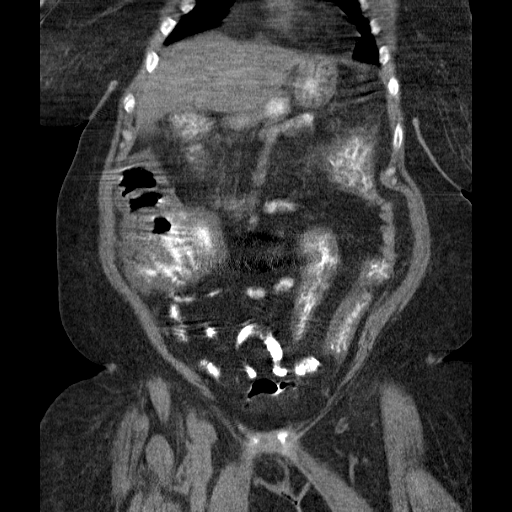
[im 42/95  soft-tissue]
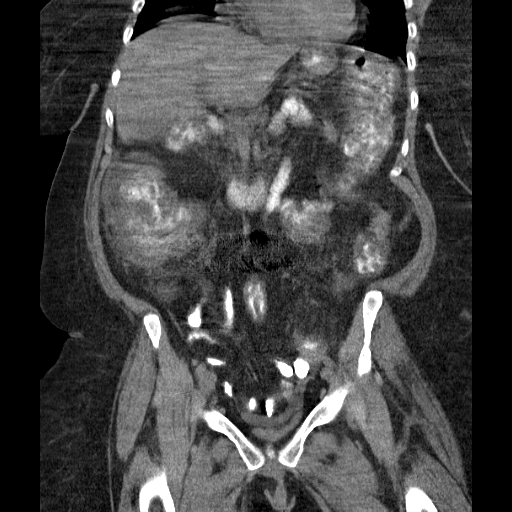
[im 53/95  soft-tissue]
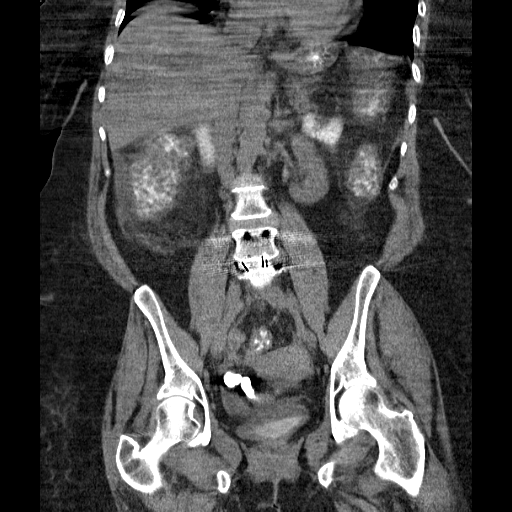

[17 of 46 positions shown; findings below may reference images not displayed]

FINDINGS: Motion artifact, lack of intravenous contrast and body habitus
markedly limits the examination.

Tiny pericardial effusion lateral to the left ventricle is noted.

There is wall thickening and inflammatory change involving the see
come and ascending colon. The hepatic flexure, transverse colon, and
distal colon are unremarkable allowing for technical quality of the
scan.

There is no pneumatosis. No extraluminal bowel gas. There is no
portal venous gas. There is fluid and inflammatory stranding lateral
to the cecum and ascending colon, in the region of the right
pericolic gutter. Inflammatory cecal nodes.

The appendix is not clearly identified.

Small amount of free fluid layers in the anterior pelvis.

Uterus and adnexa are unremarkable.  Bladder is decompressed.

Liver, gallbladder, spleen, pancreas, adrenal glands are within
normal limits.

Postoperative changes from lumbar fusion are noted.
IMPRESSION: Inflammatory changes involving the cecum and ascending colon are
noted. Differential diagnosis includes infectious colitis, ischemia,
acute appendicitis, and typhlitis. There is no pneumatosis. No
abscess. No extraluminal bowel gas.

## 2015-11-09 IMAGING — CR DG CHEST 1V PORT
1 series · 1 of 1 positions shown · non-contrast
Comparison: Chest x-ray from the same day at [DATE] p.m.

CLINICAL DATA: Check central line position.

EXAM:
PORTABLE CHEST - 1 VIEW

[AP]
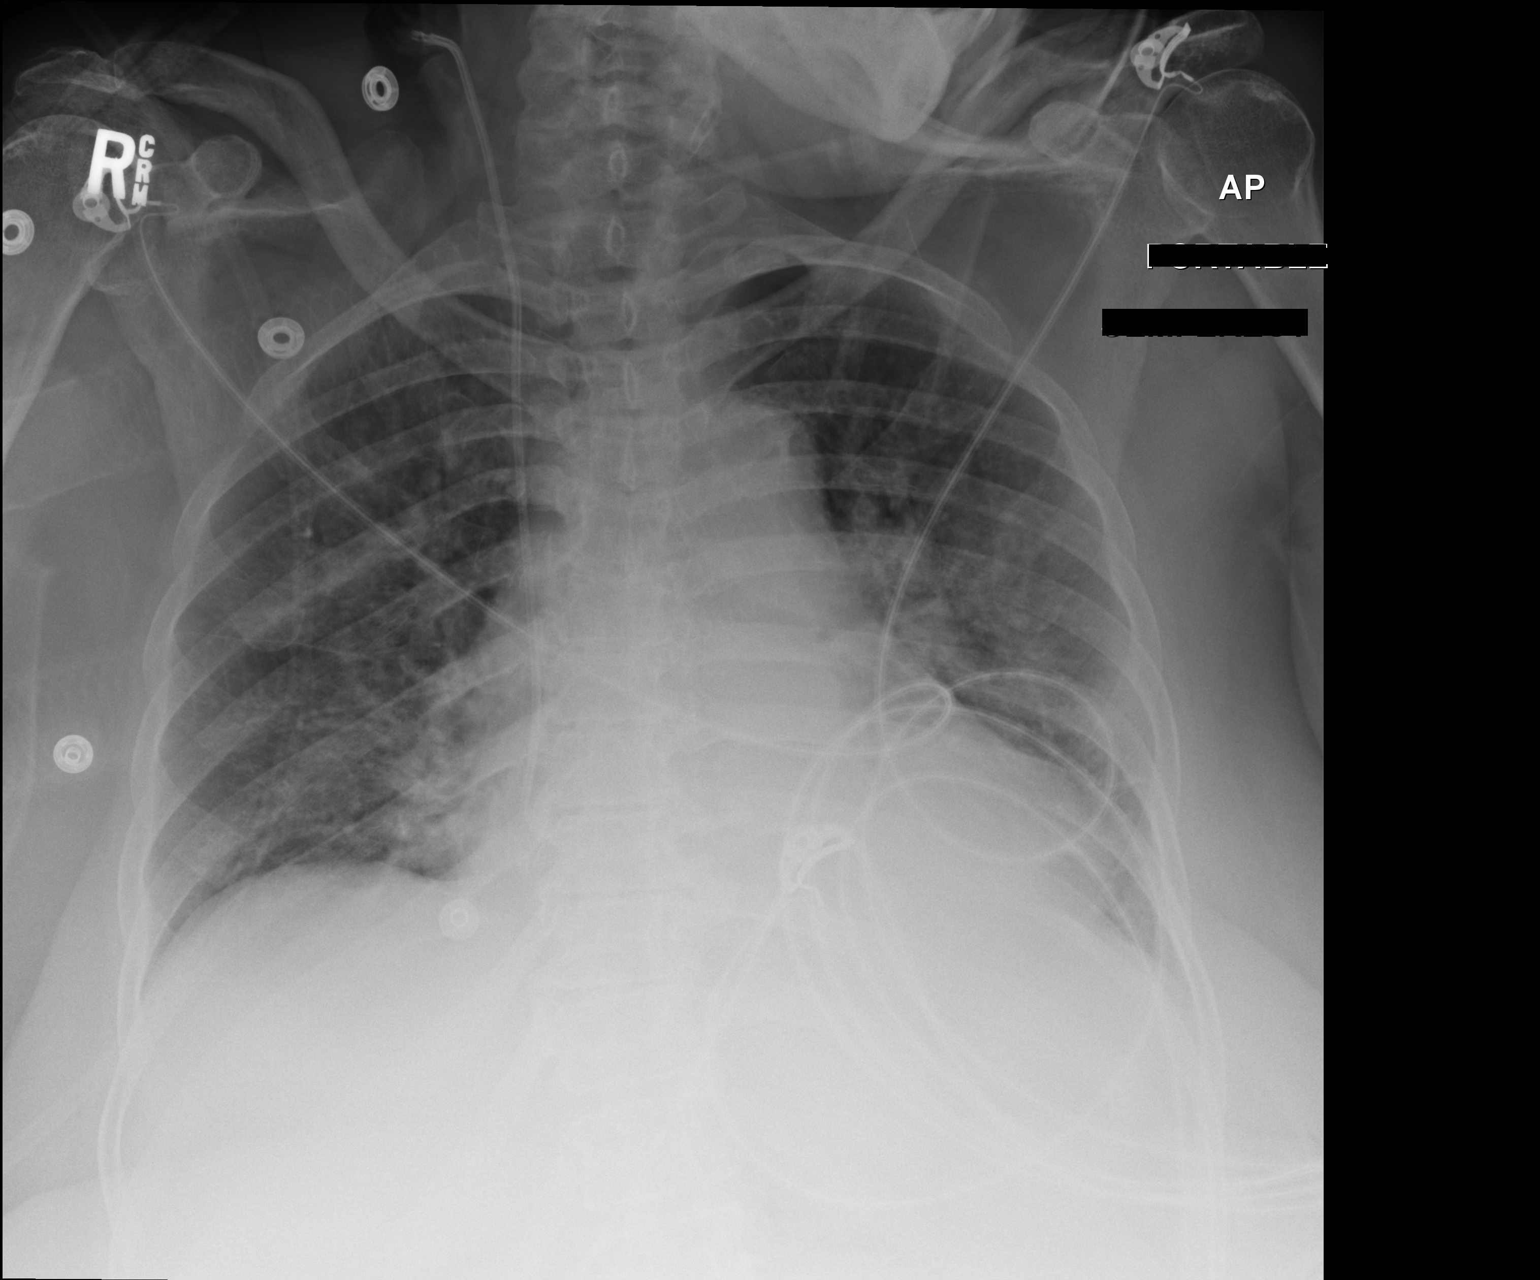

[1 of 1 positions shown; findings below may reference images not displayed]

FINDINGS: New right IJ central line, tip at the level of the upper right
atrium. There is no evidence of pneumothorax.

Stable cardiomegaly. No change in upper mediastinal contours.
Unchanged lung aeration with patchy bilateral interstitial and
airspace disease. No evidence of pleural effusion.
IMPRESSION: New right IJ catheter, tip at the upper right atrium. No
pneumothorax.

## 2015-11-10 IMAGING — CR DG CHEST 1V PORT
1 series · 1 of 1 positions shown · non-contrast
Comparison: November 08, 2013

CLINICAL DATA: Hypoxia

EXAM:
PORTABLE CHEST - 1 VIEW

[AP]
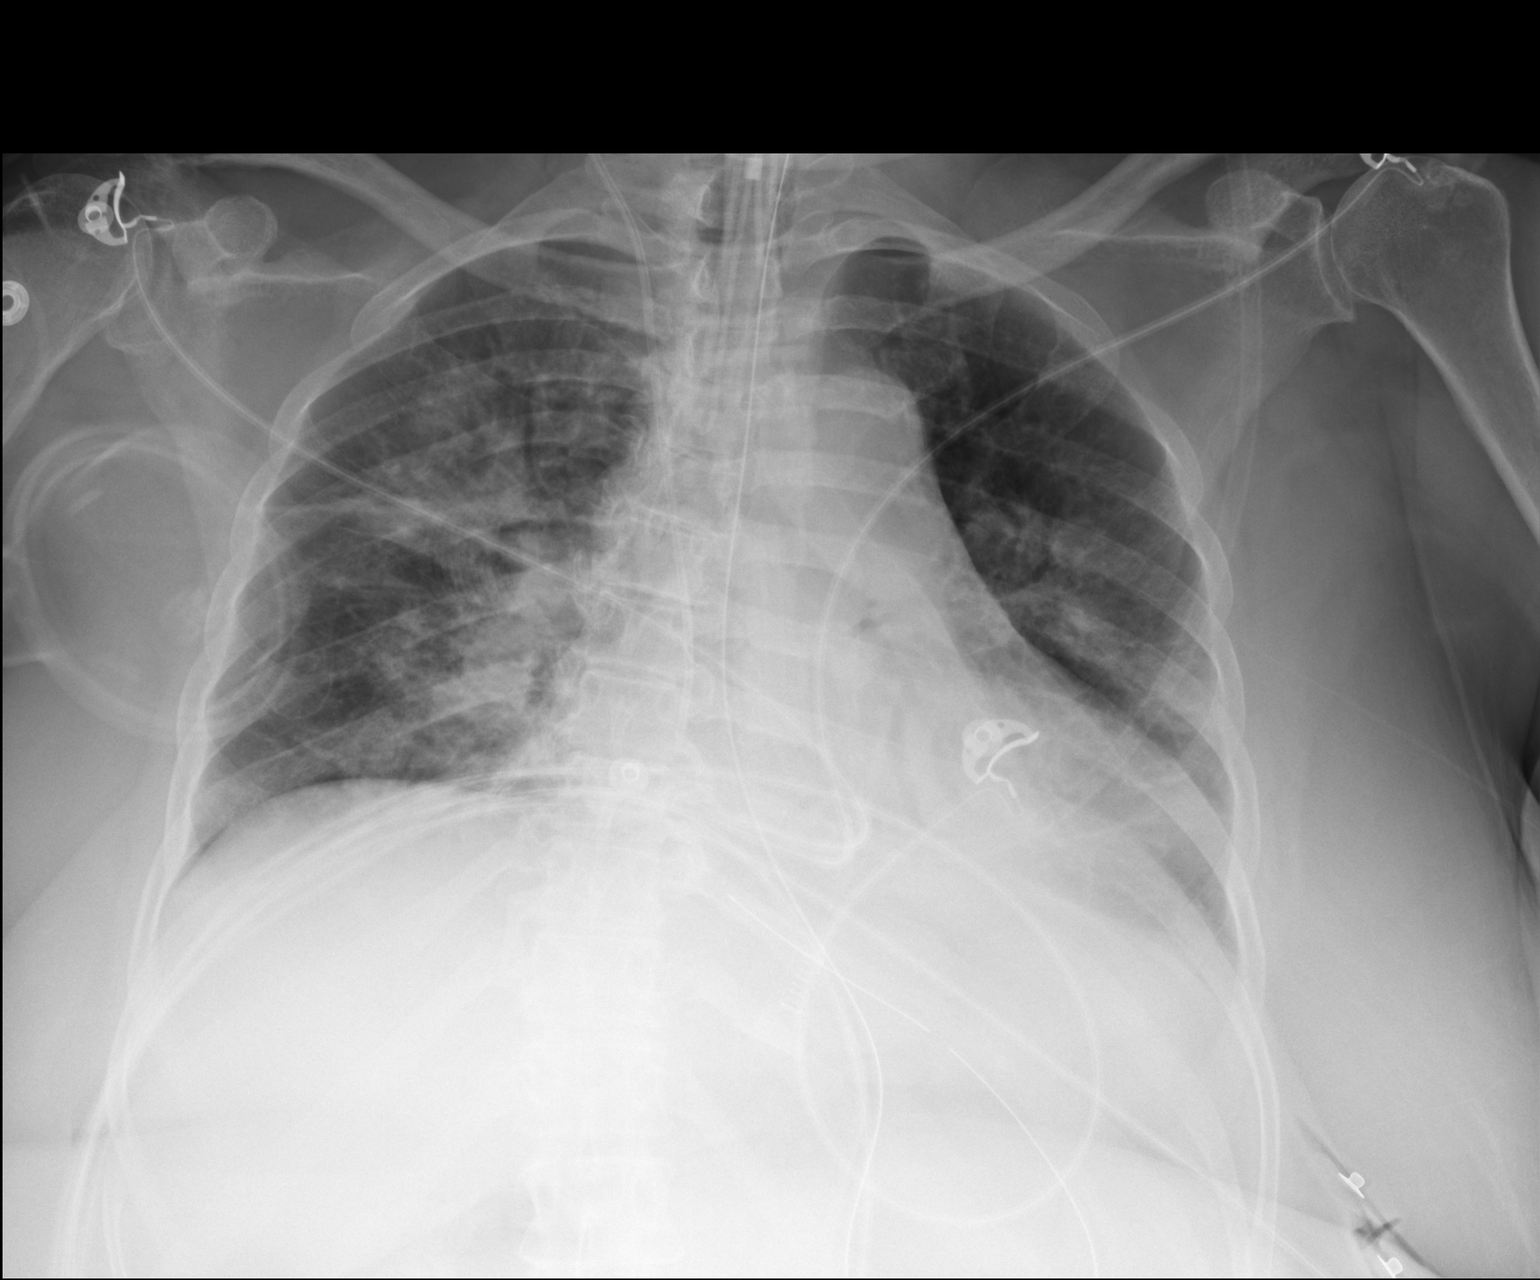

[1 of 1 positions shown; findings below may reference images not displayed]

FINDINGS: Endotracheal tube tip is 2.2 cm above the carina. Central catheter
tip is in the right atrium. There is a nasogastric tube with tip and
side-port in the stomach. No pneumothorax.

There is left lower lobe consolidation which was present 1 day
prior. There is new consolidation in the right upper lobe. Heart is
upper normal in size with normal pulmonary vascularity. No
adenopathy.
IMPRESSION: Tube and catheter positions as described. No pneumothorax. Note that
the endotracheal tube may warrant withdrawal of 1-2 cm. There is
consolidation in the right upper lobe and left lower lobe with a
right upper lobe consolidation new.
